# Patient Record
Sex: Female | Born: 1955 | Race: White | Hispanic: No | Marital: Married | Smoking: Former smoker
Health system: Southern US, Community
[De-identification: ages and names within clinical notes are randomized; demographics above are authoritative.]

## PROBLEM LIST (undated history)

## (undated) DIAGNOSIS — I219 Acute myocardial infarction, unspecified: Secondary | ICD-10-CM

## (undated) DIAGNOSIS — K219 Gastro-esophageal reflux disease without esophagitis: Secondary | ICD-10-CM

## (undated) DIAGNOSIS — I10 Essential (primary) hypertension: Secondary | ICD-10-CM

## (undated) DIAGNOSIS — J9602 Acute respiratory failure with hypercapnia: Secondary | ICD-10-CM

## (undated) DIAGNOSIS — G9341 Metabolic encephalopathy: Secondary | ICD-10-CM

## (undated) DIAGNOSIS — M1990 Unspecified osteoarthritis, unspecified site: Secondary | ICD-10-CM

## (undated) DIAGNOSIS — D649 Anemia, unspecified: Secondary | ICD-10-CM

## (undated) DIAGNOSIS — J449 Chronic obstructive pulmonary disease, unspecified: Secondary | ICD-10-CM

## (undated) DIAGNOSIS — E119 Type 2 diabetes mellitus without complications: Secondary | ICD-10-CM

## (undated) DIAGNOSIS — I509 Heart failure, unspecified: Secondary | ICD-10-CM

## (undated) DIAGNOSIS — R569 Unspecified convulsions: Secondary | ICD-10-CM

## (undated) DIAGNOSIS — J9601 Acute respiratory failure with hypoxia: Secondary | ICD-10-CM

## (undated) DIAGNOSIS — F419 Anxiety disorder, unspecified: Secondary | ICD-10-CM

## (undated) DIAGNOSIS — J1282 Pneumonia due to coronavirus disease 2019: Secondary | ICD-10-CM

## (undated) DIAGNOSIS — U071 COVID-19: Secondary | ICD-10-CM

## (undated) DIAGNOSIS — R4182 Altered mental status, unspecified: Secondary | ICD-10-CM

## (undated) DIAGNOSIS — J189 Pneumonia, unspecified organism: Secondary | ICD-10-CM

## (undated) DIAGNOSIS — K7400 Hepatic fibrosis, unspecified: Secondary | ICD-10-CM

## (undated) DIAGNOSIS — Z9689 Presence of other specified functional implants: Secondary | ICD-10-CM

## (undated) DIAGNOSIS — R011 Cardiac murmur, unspecified: Secondary | ICD-10-CM

## (undated) DIAGNOSIS — I739 Peripheral vascular disease, unspecified: Secondary | ICD-10-CM

## (undated) DIAGNOSIS — K760 Fatty (change of) liver, not elsewhere classified: Secondary | ICD-10-CM

## (undated) DIAGNOSIS — G4730 Sleep apnea, unspecified: Secondary | ICD-10-CM

## (undated) DIAGNOSIS — R579 Shock, unspecified: Secondary | ICD-10-CM

## (undated) HISTORY — DX: Unspecified convulsions: R56.9

## (undated) HISTORY — PX: BACK SURGERY: SHX140

---

## 1979-12-25 HISTORY — PX: CHOLECYSTECTOMY: SHX55

## 1983-12-25 HISTORY — PX: TUBAL LIGATION: SHX77

## 1992-12-24 HISTORY — PX: OTHER SURGICAL HISTORY: SHX169

## 2010-12-24 DIAGNOSIS — K760 Fatty (change of) liver, not elsewhere classified: Secondary | ICD-10-CM

## 2010-12-24 HISTORY — DX: Fatty (change of) liver, not elsewhere classified: K76.0

## 2012-10-07 HISTORY — PX: OTHER SURGICAL HISTORY: SHX169

## 2012-12-24 HISTORY — PX: COLONOSCOPY: SHX174

## 2015-03-25 HISTORY — PX: OTHER SURGICAL HISTORY: SHX169

## 2015-06-24 HISTORY — PX: OTHER SURGICAL HISTORY: SHX169

## 2016-01-25 HISTORY — PX: OTHER SURGICAL HISTORY: SHX169

## 2017-03-24 HISTORY — PX: DIAGNOSTIC MAMMOGRAM: HXRAD719

## 2017-07-24 DIAGNOSIS — G2581 Restless legs syndrome: Secondary | ICD-10-CM | POA: Insufficient documentation

## 2017-07-24 DIAGNOSIS — K219 Gastro-esophageal reflux disease without esophagitis: Secondary | ICD-10-CM | POA: Insufficient documentation

## 2017-07-24 DIAGNOSIS — E1169 Type 2 diabetes mellitus with other specified complication: Secondary | ICD-10-CM

## 2017-07-24 DIAGNOSIS — G4733 Obstructive sleep apnea (adult) (pediatric): Secondary | ICD-10-CM | POA: Insufficient documentation

## 2017-07-24 DIAGNOSIS — G4730 Sleep apnea, unspecified: Secondary | ICD-10-CM | POA: Insufficient documentation

## 2017-07-24 DIAGNOSIS — E785 Hyperlipidemia, unspecified: Secondary | ICD-10-CM | POA: Insufficient documentation

## 2017-07-24 HISTORY — DX: Type 2 diabetes mellitus with other specified complication: E11.69

## 2019-05-15 ENCOUNTER — Other Ambulatory Visit: Payer: Self-pay | Admitting: Anesthesiology

## 2019-05-15 DIAGNOSIS — M5416 Radiculopathy, lumbar region: Secondary | ICD-10-CM

## 2019-05-27 ENCOUNTER — Other Ambulatory Visit: Payer: Self-pay

## 2019-05-27 ENCOUNTER — Ambulatory Visit
Admission: RE | Admit: 2019-05-27 | Discharge: 2019-05-27 | Disposition: A | Discharge status: Home / Self Care | Payer: Medicare PPO | Source: Ambulatory Visit | Attending: Anesthesiology | Admitting: Anesthesiology

## 2019-05-27 DIAGNOSIS — M5416 Radiculopathy, lumbar region: Secondary | ICD-10-CM

## 2020-06-13 ENCOUNTER — Other Ambulatory Visit: Payer: Self-pay | Admitting: Neurosurgery

## 2020-06-20 ENCOUNTER — Other Ambulatory Visit: Payer: Self-pay | Admitting: Neurosurgery

## 2020-06-23 NOTE — H&P (Signed)
Chief Complaint   Back pain  HPI   HPI: Brenda Morgan is a 64 y.o. female with longstanding history of lower back and right greater than left leg pain.  Pain is severe and interferes with ADLs.  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  She has failed reasonable conservative treatment to date including p.o. medications, physical therapy and epidural steroid injections.  She presents today for lumbar decompression fusion at L4-5.  She is without any concerns.  There are no problems to display for this patient.   PMH: No past medical history on file.  PSH:  No medications prior to admission.    SH: Social History   Tobacco Use  . Smoking status: Not on file  Substance Use Topics  . Alcohol use: Not on file  . Drug use: Not on file    MEDS: Prior to Admission medications   Medication Sig Start Date End Date Taking? Authorizing Provider  atorvastatin (LIPITOR) 10 MG tablet Take 5 mg by mouth daily.   Yes [provider]  baclofen (LIORESAL) 20 MG tablet Take 20 mg by mouth 3 (three) times daily.   Yes [provider]  carvedilol (COREG) 25 MG tablet Take 25 mg by mouth daily.   Yes [provider]  cetirizine (ZYRTEC) 10 MG tablet Take 10 mg by mouth daily.   Yes [provider]  Cholecalciferol (VITAMIN D) 50 MCG (2000 UT) tablet Take 2,000 Units by mouth daily.   Yes [provider]  Dietary Management Product (OMNIVEX PO) Take 1 tablet by mouth daily.   Yes [provider]  Empagliflozin-metFORMIN HCl ER (SYNJARDY XR) 25-1000 MG TB24 Take 1 tablet by mouth daily.   Yes [provider]  famotidine (PEPCID) 40 MG tablet Take 40 mg by mouth daily.   Yes [provider]  ferrous sulfate 325 (65 FE) MG tablet Take 325 mg by mouth daily with breakfast.   Yes [provider]  fluticasone (FLONASE) 50 MCG/ACT nasal spray Place 1 spray into both  nostrils 2 (two) times daily as needed for allergies or rhinitis.   Yes [provider]  Fluticasone-Salmeterol (ADVAIR) 250-50 MCG/DOSE AEPB Inhale 1 puff into the lungs 2 (two) times daily.   Yes [provider]  icosapent Ethyl (VASCEPA) 1 g capsule Take 1 g by mouth 2 (two) times daily.   Yes [provider]  insulin aspart protamine- aspart (NOVOLOG MIX 70/30) (70-30) 100 UNIT/ML injection Inject 35-55 Units into the skin 2 (two) times daily with a meal.   Yes [provider]  ipratropium-albuterol (DUONEB) 0.5-2.5 (3) MG/3ML SOLN Take 3 mLs by nebulization every 6 (six) hours as needed (asthma).   Yes [provider]  lactulose (CHRONULAC) 10 GM/15ML solution Take 10 g by mouth 3 (three) times daily.   Yes [provider]  lamoTRIgine (LAMICTAL) 150 MG tablet Take 150 mg by mouth daily.   Yes [provider]  magnesium oxide (MAG-OX) 400 MG tablet Take 400 mg by mouth daily.   Yes [provider]  metolazone (ZAROXOLYN) 5 MG tablet Take 5 mg by mouth 3 (three) times a week.   Yes [provider]  oxyCODONE (OXY IR/ROXICODONE) 5 MG immediate release tablet Take 5 mg by mouth every 6 (six) hours.   Yes [provider]  pantoprazole (PROTONIX) 40 MG tablet Take 40 mg by mouth daily.   Yes [provider]  pioglitazone (  ACTOS) 15 MG tablet Take 15 mg by mouth daily.   Yes [provider]  potassium chloride SA (KLOR-CON) 20 MEQ tablet Take 20 mEq by mouth daily.   Yes [provider]  pramipexole (MIRAPEX) 0.5 MG tablet Take 1 mg by mouth at bedtime.   Yes [provider]  pregabalin (LYRICA) 200 MG capsule Take 200 mg by mouth in the morning, at noon, and at bedtime.   Yes [provider]  Semaglutide, 1 MG/DOSE, (OZEMPIC, 1 MG/DOSE,) 2 MG/1.5ML SOPN Inject 1 mg into the skin every Sunday.   Yes [provider]  silver sulfADIAZINE (SILVADENE) 1 % cream  Apply 1 application topically daily.   Yes [provider]  spironolactone (ALDACTONE) 50 MG tablet Take 50 mg by mouth daily.   Yes [provider]  torsemide (DEMADEX) 20 MG tablet Take 20 mg by mouth daily.   Yes [provider]  Vitamin D, Ergocalciferol, (DRISDOL) 1.25 MG (50000 UNIT) CAPS capsule Take 50,000 Units by mouth every 7 (seven) days.   Yes [provider]    ALLERGY: Allergies  Allergen Reactions  . Amlodipine Anaphylaxis, Swelling and Other (See Comments)    Tongue swelling   . Naltrexone Hives  . Penicillins Hives and Other (See Comments)    Tolerated ceftriaxone and cefepime 06/2017 Hives      Social History   Tobacco Use  . Smoking status: Not on file  Substance Use Topics  . Alcohol use: Not on file     No family history on file.   ROS   ROS  Exam   There were no vitals filed for this visit. General appearance: WDWN, NAD Eyes: No scleral injection Cardiovascular: Regular rate and rhythm without murmurs, rubs, gallops. No edema or variciosities. Distal pulses normal. Pulmonary: Effort normal, non-labored breathing Musculoskeletal:     Muscle tone upper extremities: Normal    Muscle tone lower extremities: Normal    Motor exam: Upper Extremities Deltoid Bicep Tricep Grip  Right 5/5 5/5 5/5 5/5  Left 5/5 5/5 5/5 5/5   Lower Extremity IP Quad PF DF EHL  Right 5/5 5/5 5/5 5/5 5/5  Left 5/5 5/5 5/5 5/5 5/5   Neurological Mental Status:    - Patient is awake, alert, oriented to person, place, month, year, and situation    - Patient is able to give a clear and coherent history.    - No signs of aphasia or neglect Cranial Nerves    - II: Visual Fields are full. PERRL    - III/IV/VI: EOMI without ptosis or diploplia.     - V: Facial sensation is grossly normal    - VII: Facial movement is symmetric.     - VIII: hearing is intact to voice    - X: Uvula elevates symmetrically    - XI: Shoulder shrug is  symmetric.    - XII: tongue is midline without atrophy or fasciculations.  Sensory: Sensation grossly intact to LT  Results - Imaging/Labs   No results found for this or any previous visit (from the past 48 hour(s)).  No results found.  IMAGING: RI of the lumbar spine dated 05/18/2020 was personally reviewed.  This demonstrates maintenance of normal lumbar lordosis.  Primary finding is at L4-5 where there is grade 1 anterolisthesis.  There is significant bilateral facet arthropathy with fairly broad disc bulge and right greater than left lateral recess and foraminal stenosis which is severe on the right.  Impression/Plan  64 y.o. female with relatively chronic pain including low back and right greater than left leg pain likely related to spondylolisthesis, stenosis, and spondylosis at L4-5.  She has failed reasonable conservative treatment to date.  We will proceed with surgical decompression and fusion at L4-5.  We have discussed the risks, benefits, and alternatives to surgery at length in the office. All questions today were answered and consent was obtained.  Lisbeth Renshaw, MD [facility]

## 2020-06-23 NOTE — Progress Notes (Signed)
[id number] [place], [place] - [address] AT [facility] [address] [place] [address] Phone: [phone] Fax: [phone]      Your procedure is scheduled on Tuesday, July 6th.  Report to [facility] Main Entrance "A" at 9:15 A.M., and check in at the Admitting office.  Call this number if you have problems the morning of surgery:  [phone]  Call [phone] if you have any questions prior to your surgery date Monday-Friday 8am-4pm    Remember:  Do not eat or drink after midnight the night before your surgery     Take these medicines the morning of surgery with A SIP OF WATER   Atorvastatin (Lipitor)  Carvedilol (Coreg)  Zyrtec - if needed  Baclofen  Famotidine (Pepcid)  Flonase nasal spray  Advair inhaler - bring with you on the day of surgery  Duoneb inhaler   Lamictal  Oxycodone  Pantoprazole (Protonix)  Pregabalin (Lyrica)    As of today, STOP taking any Aspirin (unless otherwise instructed by your surgeon) and Aspirin containing products, Aleve, Naproxen, Ibuprofen, Motrin, Advil, Goody's, BC's, all herbal medications, fish oil, and all vitamins.   WHAT DO I DO ABOUT MY DIABETES MEDICATION?   [name] Do not take oral diabetes medicines (pills) the morning of surgery. - Actos  . THE DAY BEFORE SURGERY, do NOT take Empagliflozin-Metformin Kirk Ruths). . THE NIGHT BEFORE SURGERY, if you take a PM (dinner) dose, only take 70% of dose of Novolog 70/30.     [name] THE MORNING OF SURGERY, do NOT take Empagliflozin-Metformin Kirk Ruths), or Novolog 70/30.   . If your CBG is greater than 220 mg/dL, you may take  of your sliding scale (correction) dose of insulin.   HOW TO MANAGE YOUR DIABETES BEFORE AND AFTER SURGERY  Why is it important to control my blood sugar before and after surgery? . Improving blood sugar levels before and after surgery helps healing and can limit problems. . A way of improving blood sugar control is eating a  healthy diet by: o  Eating less sugar and carbohydrates o  Increasing activity/exercise o  Talking with your doctor about reaching your blood sugar goals . High blood sugars (greater than 180 mg/dL) can raise your risk of infections and slow your recovery, so you will need to focus on controlling your diabetes during the weeks before surgery. . Make sure that the doctor who takes care of your diabetes knows about your planned surgery including the date and location.  How do I manage my blood sugar before surgery? . Check your blood sugar at least 4 times a day, starting 2 days before surgery, to make sure that the level is not too high or low. . Check your blood sugar the morning of your surgery when you wake up and every 2 hours until you get to the Short Stay unit. o If your blood sugar is less than 70 mg/dL, you will need to treat for low blood sugar: - Do not take insulin. - Treat a low blood sugar (less than 70 mg/dL) with  cup of clear juice (cranberry or apple), 4 glucose tablets, OR glucose gel. - Recheck blood sugar in 15 minutes after treatment (to make sure it is greater than 70 mg/dL). If your blood sugar is not greater than 70 mg/dL on recheck, call [phone] for further instructions. . Report your blood sugar to the short stay nurse when you get to Short Stay.  . If you are  admitted to the hospital after surgery: o Your blood sugar will be checked by the staff and you will probably be given insulin after surgery (instead of oral diabetes medicines) to make sure you have good blood sugar levels. o The goal for blood sugar control after surgery is 80-180 mg/dL.                THE DAY OF SURGERY            Do not wear jewelry, make up, or nail polish            Do not wear lotions, powders, perfumes, or deodorant.            Do not shave 48 hours prior to surgery.              Do not bring valuables to the hospital.            [facility] is not responsible for any  belongings or valuables.  Do NOT Smoke (Tobacco/Vapping) or drink Alcohol 24 hours prior to your procedure If you use a CPAP at night, you may bring all equipment for your overnight stay.   Contacts, glasses, dentures or bridgework may not be worn into surgery.      For patients admitted to the hospital, discharge time will be determined by your treatment team.   Patients discharged the day of surgery will not be allowed to drive home, and someone needs to stay with them for 24 hours.    Special instructions:   [place]- Preparing For Surgery  Before surgery, you can play an important role. Because skin is not sterile, your skin needs to be as free of germs as possible. You can reduce the number of germs on your skin by washing with CHG (chlorahexidine gluconate) Soap before surgery.  CHG is an antiseptic cleaner which kills germs and bonds with the skin to continue killing germs even after washing.    Oral Hygiene is also important to reduce your risk of infection.  Remember - BRUSH YOUR TEETH THE MORNING OF SURGERY WITH YOUR REGULAR TOOTHPASTE  Please do not use if you have an allergy to CHG or antibacterial soaps. If your skin becomes reddened/irritated stop using the CHG.  Do not shave (including legs and underarms) for at least 48 hours prior to first CHG shower. It is OK to shave your face.  Please follow these instructions carefully.   1. Shower the NIGHT BEFORE SURGERY and the MORNING OF SURGERY with CHG Soap.   2. If you chose to wash your hair, wash your hair first as usual with your normal shampoo.  3. After you shampoo, rinse your hair and body thoroughly to remove the shampoo.  4. Use CHG as you would any other liquid soap. You can apply CHG directly to the skin and wash gently with a scrungie or a clean washcloth.   5. Apply the CHG Soap to your body ONLY FROM THE NECK DOWN.  Do not use on open wounds or open sores. Avoid contact with your eyes, ears, mouth and  genitals (private parts). Wash Face and genitals (private parts)  with your normal soap.   6. Wash thoroughly, paying special attention to the area where your surgery will be performed.  7. Thoroughly rinse your body with warm water from the neck down.  8. DO NOT shower/wash with your normal soap after using and rinsing off the CHG Soap.  9. Pat yourself dry with a  CLEAN TOWEL.  10. Wear CLEAN PAJAMAS to bed the night before surgery, wear comfortable clothes the morning of surgery  11. Place CLEAN SHEETS on your bed the night of your first shower and DO NOT SLEEP WITH PETS.   Day of Surgery:   Do not apply any deodorants/lotions.  Please wear clean clothes to the hospital/surgery center.   Remember to brush your teeth WITH YOUR REGULAR TOOTHPASTE.   Please read over the following fact sheets that you were given.

## 2020-06-24 ENCOUNTER — Encounter (HOSPITAL_COMMUNITY)
Admission: RE | Admit: 2020-06-24 | Discharge: 2020-06-24 | Disposition: A | Discharge status: Home / Self Care | Payer: BC Managed Care – PPO | Source: Ambulatory Visit | Attending: Neurosurgery | Admitting: Neurosurgery

## 2020-06-24 ENCOUNTER — Other Ambulatory Visit: Payer: Self-pay

## 2020-06-24 ENCOUNTER — Encounter (HOSPITAL_COMMUNITY): Payer: Self-pay

## 2020-06-24 ENCOUNTER — Other Ambulatory Visit (HOSPITAL_COMMUNITY)
Admission: RE | Admit: 2020-06-24 | Discharge: 2020-06-24 | Disposition: A | Discharge status: Home / Self Care | Payer: BC Managed Care – PPO | Source: Ambulatory Visit | Attending: Neurosurgery | Admitting: Neurosurgery

## 2020-06-24 DIAGNOSIS — Z01818 Encounter for other preprocedural examination: Secondary | ICD-10-CM | POA: Diagnosis present

## 2020-06-24 DIAGNOSIS — Z01812 Encounter for preprocedural laboratory examination: Secondary | ICD-10-CM | POA: Diagnosis not present

## 2020-06-24 DIAGNOSIS — Z20822 Contact with and (suspected) exposure to covid-19: Secondary | ICD-10-CM | POA: Insufficient documentation

## 2020-06-24 HISTORY — DX: Type 2 diabetes mellitus without complications: E11.9

## 2020-06-24 HISTORY — DX: Sleep apnea, unspecified: G47.30

## 2020-06-24 HISTORY — DX: Chronic obstructive pulmonary disease, unspecified: J44.9

## 2020-06-24 LAB — CBC
HCT: 43.6 % (ref 36.0–46.0)
Hemoglobin: 13.6 g/dL (ref 12.0–15.0)
MCH: 29.4 pg (ref 26.0–34.0)
MCHC: 31.2 g/dL (ref 30.0–36.0)
MCV: 94.2 fL (ref 80.0–100.0)
Platelets: 252 10*3/uL (ref 150–400)
RBC: 4.63 MIL/uL (ref 3.87–5.11)
RDW: 15 % (ref 11.5–15.5)
WBC: 11.7 10*3/uL — ABNORMAL HIGH (ref 4.0–10.5)
nRBC: 0 % (ref 0.0–0.2)

## 2020-06-24 LAB — BASIC METABOLIC PANEL
Anion gap: 14 (ref 5–15)
BUN: 31 mg/dL — ABNORMAL HIGH (ref 8–23)
CO2: 33 mmol/L — ABNORMAL HIGH (ref 22–32)
Calcium: 10 mg/dL (ref 8.9–10.3)
Chloride: 92 mmol/L — ABNORMAL LOW (ref 98–111)
Creatinine, Ser: 1.5 mg/dL — ABNORMAL HIGH (ref 0.44–1.00)
GFR calc Af Amer: 43 mL/min — ABNORMAL LOW (ref >60)
GFR calc non Af Amer: 37 mL/min — ABNORMAL LOW (ref >60)
Glucose, Bld: 50 mg/dL — ABNORMAL LOW (ref 70–99)
Potassium: 3.5 mmol/L (ref 3.5–5.1)
Sodium: 139 mmol/L (ref 135–145)

## 2020-06-24 LAB — GLUCOSE, CAPILLARY: Glucose-Capillary: 122 mg/dL — ABNORMAL HIGH (ref 70–99)

## 2020-06-24 LAB — TYPE AND SCREEN
ABO/RH(D): A POS
Antibody Screen: NEGATIVE

## 2020-06-24 LAB — HEMOGLOBIN A1C
Hgb A1c MFr Bld: 6.7 % — ABNORMAL HIGH (ref 4.8–5.6)
Mean Plasma Glucose: 145.59 mg/dL

## 2020-06-24 LAB — SURGICAL PCR SCREEN
MRSA, PCR: POSITIVE — AB
Staphylococcus aureus: POSITIVE — AB

## 2020-06-24 LAB — SARS CORONAVIRUS 2 (TAT 6-24 HRS): SARS Coronavirus 2: NEGATIVE

## 2020-06-24 NOTE — Progress Notes (Signed)
This note also relates to the following rows which could not be included: CBG - Cannot attach notes to extension rows    06/24/20 0950  Vitals  Temp (!) 97.5 F (36.4 C)  Temp Source Oral  Pulse Rate 73  Pulse Rate Source Dinamap  Resp 18  Respiratory Pattern Regular  BP 119/73  SpO2 98 %  Oxygen Therapy  O2 Device Room Air  Height and Weight  Height 5\' 6"  (1.676 m)  Height Method Stated  Weight 90.7 kg  Weight Method Stated  BMI (Calculated) 32.3  BSA (Calculated - sq m) 2.06 sq meters  Pain Assessment  Pain Scale 0-10  Pain Score 8  Pain Type Acute pain  Pain Location Hip  Pain Orientation Left;Right  Pain Descriptors / Indicators Radiating  Pain Onset On-going  Patients Stated Pain Goal 3  Pain Intervention(s) RN made aware  Multiple Pain Sites Yes  LOC  Level of Consciousness Alert  Orientation Level Oriented X4    CBG 122    Pt present to for her PAT appt. Per pt she came in and was at the front desk waiting for someone to sign her in when she readjusted her position and her standing walker tipped over resulting in her also falling. Staff all heard a loud crash and immediately started to look around to find source of noise, pt was found on the floor by the entrance door to PAT front desk. Pt was conscious A+Ox4, pt stated she hit the back of her head. Vital signs immediately obtained (see above), CBG checked (see above). Per pt she had leaned against her walker and the walker tipped over, which resulted in her falling as well.  , Fayrene Fearing called immediately as soon as pt was found on the floor, wheelchair brought to pt and when vitals and CBG checked, pt was transferred to wheelchair. [place], PA assessed pt, pt does not seem to be in any immediate danger or distress, just shocked from the fall.  Skin assessment: pt had left skin tear and bruising on L anterior forearm, per pt, she has had some recent falls at home and the skin tear and bruising was the result of that.   Pt had skin abrasion on R posterior elbow that was caused from today's fall. Both sites were cleaned with betadine and covered with mepilex.   Per pt, she wanted to continue with her PAT appt, per Fayrene Fearing, Fayrene Fearing, pt did not need to go to ED for any immediate interventions, pt agrees to this.  During PAT, pt did start complaining of increased pressure behind the L eye. Full neuro exam performed, pt strengths were equal bilaterally in upper and lower extremities, pupils round/reactive/equal. Pt complains headache 6/10 pain. At this time, pt did not see too fatigued, pt A+Ox4. BP rechecked 113/61 (see flowsheets). Pt husband brought to room. [place], Fayrene Fearing made aware.   Throughout PAT appt, pt noted to have increased fatigue. She would fall asleep in the middle of speaking, dose off while questions were being asked. Per husband in the room, this was normal for her because she doesn't sleep well at night and hasn't been for the past couple months. Per pt, she only got 1 hour of sleep the night prior. Towards end of PAT appt, husband stepped out.   During pre-op instructions, pt could not stay awake and continued to fall asleep more consistently. [place], PA made aware of pt condition. Neuro exam performed again, pt is oriented and easily arousable, however,  continued to fall sleep really easily. When PAT appt was finished, Fayrene Fearing, [place] came to assess pt again. No further interventions at this time. Pt was to be monitored closely by husband who has background as EMT. Per husband, he will take her to local urgent care if her condition changes. Pt brought out via wheelchair. Pt walker and personal belongings with patient.   All events updated to [facility], RN and Lindsi, RN as well.

## 2020-06-24 NOTE — Progress Notes (Signed)
Your procedure is scheduled on Tuesday, July 6.  Report to [facility] Main Entrance "A" at 09:10 A.M., and check in at the Admitting office.  Call this number if you have problems the morning of surgery: [phone]  Call [phone] if you have any questions prior to your surgery date Monday-Friday 8am-4pm   Remember: Do not eat or drink after midnight the night before your surgery    Take these medicines the morning of surgery with A SIP OF WATER: atorvastatin (LIPITOR) baclofen (LIORESAL) carvedilol (COREG) cetirizine (ZYRTEC)  famotidine (PEPCID) lamoTRIgine (LAMICTAL) oxyCODONE (OXY IR/ROXICODONE) pantoprazole (PROTONIX)  pregabalin (LYRICA)   IF NEEDED: fluticasone (FLONASE) ipratropium-albuterol (DUONEB) **Please bring all inhalers with you the day of surgery.    As of today, STOP taking any Aspirin (unless otherwise instructed by your surgeon), Aspirin containing products, Aleve, Naproxen, Ibuprofen, Motrin, Advil, Goody's, BC's, all herbal medications, fish oil, and all vitamins.   WHAT DO I DO ABOUT MY DIABETES MEDICATION?  . THE DAY/NIGHT BEFORE SURGERY  o Empagliflozin-metFORMIN HCl ER (SYNJARDY XR) - NONE   o insulin aspart protamine- aspart (NOVOLOG MIX 70/30) - AM dose - take as usual - PM dose - take 70% of dose (usual dose x 0.7)  o pioglitazone (ACTOS) - take as usual    . THE MORNING OF SURGERY o Empagliflozin-metFORMIN HCl ER (SYNJARDY XR) - NONE  o insulin aspart protamine- aspart (NOVOLOG MIX 70/30) - NONE o pioglitazone (ACTOS) - NONE   DO NOT take any ORAL diabetic medications on the morning of surgery  . If your CBG is greater than 220 mg/dL, you may take  of your sliding scale (correction) dose of insulin.   HOW TO MANAGE YOUR DIABETES BEFORE AND AFTER SURGERY  Why is it important to control my blood sugar before and after surgery? . Improving blood sugar levels before and after surgery helps healing and can limit  problems. . A way of improving blood sugar control is eating a healthy diet by: o  Eating less sugar and carbohydrates o  Increasing activity/exercise o  Talking with your doctor about reaching your blood sugar goals . High blood sugars (greater than 180 mg/dL) can raise your risk of infections and slow your recovery, so you will need to focus on controlling your diabetes during the weeks before surgery. . Make sure that the doctor who takes care of your diabetes knows about your planned surgery including the date and location.  How do I manage my blood sugar before surgery? . Check your blood sugar at least 4 times a day, starting 2 days before surgery, to make sure that the level is not too high or low. . Check your blood sugar the morning of your surgery when you wake up and every 2 hours until you get to the Short Stay unit. o If your blood sugar is less than 70 mg/dL, you will need to treat for low blood sugar: - Do not take insulin. - Treat a low blood sugar (less than 70 mg/dL) with  cup of clear juice (cranberry or apple), 4 glucose tablets, OR glucose gel. - Recheck blood sugar in 15 minutes after treatment (to make sure it is greater than 70 mg/dL). If your blood sugar is not greater than 70 mg/dL on recheck, call [phone] for further instructions. . Report your blood sugar to the short stay nurse when you get to Short Stay.  . If you are admitted to the hospital after surgery: o Your blood sugar  will be checked by the staff and you will probably be given insulin after surgery (instead of oral diabetes medicines) to make sure you have good blood sugar levels. o The goal for blood sugar control after surgery is 80-180 mg/dL.     The Morning of Surgery  Do not wear jewelry, make-up or nail polish.  Do not wear lotions, powders, or perfumes, or deodorant  Do not shave 48 hours prior to surgery.    Do not bring valuables to the hospital.  [facility] is not responsible for any  belongings or valuables.  If you are a smoker, DO NOT Smoke 24 hours prior to surgery  If you wear a CPAP at night please bring your mask the morning of surgery   Remember that you must have someone to transport you home after your surgery, and remain with you for 24 hours if you are discharged the same day.   Please bring cases for contacts, glasses, hearing aids, dentures or bridgework because it cannot be worn into surgery.    Leave your suitcase in the car.  After surgery it may be brought to your room.  For patients admitted to the hospital, discharge time will be determined by your treatment team.  Patients discharged the day of surgery will not be allowed to drive home.    Special instructions:   [place]- Preparing For Surgery  Before surgery, you can play an important role. Because skin is not sterile, your skin needs to be as free of germs as possible. You can reduce the number of germs on your skin by washing with CHG (chlorahexidine gluconate) Soap before surgery.  CHG is an antiseptic cleaner which kills germs and bonds with the skin to continue killing germs even after washing.    Oral Hygiene is also important to reduce your risk of infection.  Remember - BRUSH YOUR TEETH THE MORNING OF SURGERY WITH YOUR REGULAR TOOTHPASTE  Please do not use if you have an allergy to CHG or antibacterial soaps. If your skin becomes reddened/irritated stop using the CHG.  Do not shave (including legs and underarms) for at least 48 hours prior to first CHG shower. It is OK to shave your face.  Please follow these instructions carefully.   1. Shower the NIGHT BEFORE SURGERY and the MORNING OF SURGERY with CHG Soap.   2. If you chose to wash your hair and body, wash as usual with your normal shampoo and body-wash/soap.  3. Rinse your hair and body thoroughly to remove the shampoo and soap.  4. Apply CHG directly to the skin (ONLY FROM THE NECK DOWN) and wash gently with a scrungie  or a clean washcloth.   5. Do not use on open wounds or open sores. Avoid contact with your eyes, ears, mouth and genitals (private parts). Wash Face and genitals (private parts)  with your normal soap.   6. Wash thoroughly, paying special attention to the area where your surgery will be performed.  7. Thoroughly rinse your body with warm water from the neck down.  8. DO NOT shower/wash with your normal soap after using and rinsing off the CHG Soap.  9. Pat yourself dry with a CLEAN TOWEL.  10. Wear CLEAN PAJAMAS to bed the night before surgery  11. Place CLEAN SHEETS on your bed the night of your first shower and DO NOT SLEEP WITH PETS.  12. Wear comfortable clothes the morning of surgery.     Day of Surgery:  Please shower the morning of surgery with the CHG soap Do not apply any deodorants/lotions. Please wear clean clothes to the hospital/surgery center.   Remember to brush your teeth WITH YOUR REGULAR TOOTHPASTE.   Please read over the following fact sheets that you were given.

## 2020-06-24 NOTE — Progress Notes (Signed)
Pt lab work glucose was 50. Fayrene Fearing, [place] called and made aware. Pt called to make sure she was okay, per pt she just ate and feels fine. Pt was on her way to covid testing before heading home.

## 2020-06-24 NOTE — Progress Notes (Signed)
Anesthesia Chart Review:  I was called to evaluate patient after unwitnessed fall in the preadmission testing lobby.  Reportedly, the patient was waiting to check in, standing at front desk with assistance of upright walker which she uses for ambulation, and lost her balance and fell to her right side.  This was unwitnessed, but immediately responded to by preadmission testing RNs and I was called to evaluate.  On my arrival patient was sitting on the floor, in no acute distress, no obvious injuries or deformity.  She reports she is not exactly sure why she fell, feels like she just lost her balance.  She says she has had 2 similar falls recently at home.  She says she thinks she may have hit the back of her head when she fell, however she denies any pain in her head, and there are no obvious signs of injury.  Her left arm does have an abrasion with scant bleeding, but she says this was pre-existing from a fall at home.  She does have some neuropathic pain in lower extremities reportedly due to lumbar spine pathology.  She is not on any anticoagulants.  Her vital signs were checked and were within normal limits.  Her blood sugar was 122.  Her mentation was appropriate.  She was alert and oriented x4.  Respiratory and cardiovascular exam were normal.  She was able to stand with assistance and was helped to a wheelchair.  She was offered evaluation in emergency department but stated she did not feel like this was necessary.  Her preoperative appointment was carried out like usual. She was noted by PAT RN to fall asleep easily during the interview, but was easily arousable and oriented x4. She was able to carry conversation easily, but would fall asleep without stimulation. Her husband states this is normal. Pt states she has had excessive daytime somnolence for several years, she has OSA but says she does not use her BiPAP and only sleeps a few hours per night. I did reevaluate the patient again at the end of her  appointment. Her only complaint was mild occipital soreness, she denied pain elsewhere, said she otherwise felt at baseline. She was able to converse easily and responded appropriately to all questions. Repeat physical exam showed no signs of injury or gross deficit. She was again offered ED eval but she declined. Pt's husband works as an Museum/gallery exhibitions officer and was given strict precautions for bringing pt for ED eval.   Zannie Cove [facility] Short Stay Center/Anesthesiology Phone [phone] 06/24/2020 2:52 PM

## 2020-06-24 NOTE — Progress Notes (Signed)
PCP - Dr. Ronnette Hila Cardiologist - Dr. Rockne Menghini Pulmonologist - Dr. Orson Aloe Sleep apnea - Dr. Egbert Garibaldi Orthopedic - Dr. Egbert Garibaldi Gastroenterologist - Dr. Alric Ran Urologist - Dr. Gershon Mussel  (pt is from [place])   Chest x-ray - n/a EKG - 06/24/20 Stress Test - pt denies  ECHO - pt denies Cardiac Cath - pt denies  Sleep Study - yes BiPAP -  yes  Fasting Blood Sugar - 100s Checks Blood Sugar 2x/day  Blood Thinner Instructions:n/a Aspirin Instructions:n/a  ERAS Protcol - n/a PRE-SURGERY Ensure or G2- n/a  COVID TEST- 06/24/20  Coronavirus Screening  Have you experienced the following symptoms:  Cough yes/no: No Fever (>100.53F)  yes/no: No Runny nose yes/no: No Sore throat yes/no: No Difficulty breathing/shortness of breath  yes/no: No  Have you or a family member traveled in the last 14 days and where? yes/no: No   If the patient indicates "YES" to the above questions, their PAT will be rescheduled to limit the exposure to others and, the surgeon will be notified. THE PATIENT WILL NEED TO BE ASYMPTOMATIC FOR 14 DAYS.   If the patient is not experiencing any of these symptoms, the PAT nurse will instruct them to NOT bring anyone with them to their appointment since they may have these symptoms or traveled as well.   Please remind your patients and families that hospital visitation restrictions are in effect and the importance of the restrictions.     Anesthesia review: yes - pt had unwitnessed fall coming in to PAT appt at front desk/ hit her head. See notes.   Patient denies shortness of breath, fever, cough and chest pain at PAT appointment   All instructions explained to the patient, with a verbal understanding of the material. Patient agrees to go over the instructions while at home for a better understanding. Patient also instructed to self quarantine after being tested for COVID-19. The opportunity to ask questions was provided.

## 2020-06-28 ENCOUNTER — Inpatient Hospital Stay (HOSPITAL_COMMUNITY): Payer: BC Managed Care – PPO | Admitting: Vascular Surgery

## 2020-06-28 ENCOUNTER — Encounter (HOSPITAL_COMMUNITY)
Admission: RE | Disposition: A | Discharge status: Home Health | Payer: Self-pay | Source: Home / Self Care | Attending: Neurosurgery

## 2020-06-28 ENCOUNTER — Inpatient Hospital Stay (HOSPITAL_COMMUNITY)
Admission: RE | Admit: 2020-06-28 | Discharge: 2020-07-01 | DRG: 455 | Disposition: A | Discharge status: Home Health | Payer: BC Managed Care – PPO | Attending: Neurosurgery | Admitting: Neurosurgery

## 2020-06-28 ENCOUNTER — Inpatient Hospital Stay (HOSPITAL_COMMUNITY): Payer: BC Managed Care – PPO | Admitting: Certified Registered Nurse Anesthetist

## 2020-06-28 ENCOUNTER — Inpatient Hospital Stay (HOSPITAL_COMMUNITY): Payer: BC Managed Care – PPO

## 2020-06-28 ENCOUNTER — Other Ambulatory Visit: Payer: Self-pay

## 2020-06-28 ENCOUNTER — Encounter (HOSPITAL_COMMUNITY): Payer: Self-pay | Admitting: Neurosurgery

## 2020-06-28 DIAGNOSIS — K219 Gastro-esophageal reflux disease without esophagitis: Secondary | ICD-10-CM | POA: Diagnosis present

## 2020-06-28 DIAGNOSIS — M4316 Spondylolisthesis, lumbar region: Secondary | ICD-10-CM | POA: Diagnosis present

## 2020-06-28 DIAGNOSIS — M47816 Spondylosis without myelopathy or radiculopathy, lumbar region: Secondary | ICD-10-CM | POA: Diagnosis present

## 2020-06-28 DIAGNOSIS — K08109 Complete loss of teeth, unspecified cause, unspecified class: Secondary | ICD-10-CM | POA: Diagnosis present

## 2020-06-28 DIAGNOSIS — Z79899 Other long term (current) drug therapy: Secondary | ICD-10-CM

## 2020-06-28 DIAGNOSIS — Z87892 Personal history of anaphylaxis: Secondary | ICD-10-CM

## 2020-06-28 DIAGNOSIS — Z880 Allergy status to penicillin: Secondary | ICD-10-CM

## 2020-06-28 DIAGNOSIS — Z888 Allergy status to other drugs, medicaments and biological substances status: Secondary | ICD-10-CM

## 2020-06-28 DIAGNOSIS — M48061 Spinal stenosis, lumbar region without neurogenic claudication: Principal | ICD-10-CM | POA: Diagnosis present

## 2020-06-28 DIAGNOSIS — Z794 Long term (current) use of insulin: Secondary | ICD-10-CM | POA: Diagnosis not present

## 2020-06-28 DIAGNOSIS — Z419 Encounter for procedure for purposes other than remedying health state, unspecified: Secondary | ICD-10-CM

## 2020-06-28 DIAGNOSIS — Z7951 Long term (current) use of inhaled steroids: Secondary | ICD-10-CM

## 2020-06-28 LAB — GLUCOSE, CAPILLARY
Glucose-Capillary: 74 mg/dL (ref 70–99)
Glucose-Capillary: 116 mg/dL — ABNORMAL HIGH (ref 70–99)
Glucose-Capillary: 138 mg/dL — ABNORMAL HIGH (ref 70–99)
Glucose-Capillary: 180 mg/dL — ABNORMAL HIGH (ref 70–99)

## 2020-06-28 LAB — ABO/RH: ABO/RH(D): A POS

## 2020-06-28 SURGERY — POSTERIOR LUMBAR FUSION 1 LEVEL
Anesthesia: General

## 2020-06-28 MED ORDER — VANCOMYCIN HCL IN DEXTROSE 1-5 GM/200ML-% IV SOLN
1000.0000 mg | Freq: Once | INTRAVENOUS | Status: AC
  Administered 2020-06-28: 1000 mg via INTRAVENOUS
  Filled 2020-06-28: qty 200

## 2020-06-28 MED ORDER — PHENYLEPHRINE 40 MCG/ML (10ML) SYRINGE FOR IV PUSH (FOR BLOOD PRESSURE SUPPORT)
PREFILLED_SYRINGE | INTRAVENOUS | Status: AC
  Filled 2020-06-28: qty 20

## 2020-06-28 MED ORDER — DOCUSATE SODIUM 100 MG PO CAPS
100.0000 mg | ORAL_CAPSULE | Freq: Two times a day (BID) | ORAL | Status: DC
  Administered 2020-06-28 – 2020-07-01 (×6): 100 mg via ORAL
  Filled 2020-06-28 (×6): qty 1

## 2020-06-28 MED ORDER — ICOSAPENT ETHYL 1 G PO CAPS
1.0000 g | ORAL_CAPSULE | Freq: Two times a day (BID) | ORAL | Status: DC
  Administered 2020-06-28 – 2020-07-01 (×5): 1 g via ORAL
  Filled 2020-06-28 (×6): qty 1

## 2020-06-28 MED ORDER — HYDROCODONE-ACETAMINOPHEN 5-325 MG PO TABS
1.0000 | ORAL_TABLET | ORAL | Status: DC | PRN
  Administered 2020-06-29 (×2): 1 via ORAL
  Filled 2020-06-28 (×2): qty 1

## 2020-06-28 MED ORDER — LIDOCAINE-EPINEPHRINE 1 %-1:100000 IJ SOLN
INTRAMUSCULAR | Status: AC
  Filled 2020-06-28: qty 1

## 2020-06-28 MED ORDER — IPRATROPIUM-ALBUTEROL 0.5-2.5 (3) MG/3ML IN SOLN: 3.0000 mL | Freq: Four times a day (QID) | RESPIRATORY_TRACT | Status: DC | PRN

## 2020-06-28 MED ORDER — ARTIFICIAL TEARS OPHTHALMIC OINT
TOPICAL_OINTMENT | OPHTHALMIC | Status: AC
  Filled 2020-06-28: qty 3.5

## 2020-06-28 MED ORDER — CEFAZOLIN SODIUM-DEXTROSE 2-4 GM/100ML-% IV SOLN
2.0000 g | INTRAVENOUS | Status: AC
  Administered 2020-06-28: 2 g via INTRAVENOUS
  Filled 2020-06-28: qty 100

## 2020-06-28 MED ORDER — BACLOFEN 10 MG PO TABS
20.0000 mg | ORAL_TABLET | Freq: Three times a day (TID) | ORAL | Status: DC
  Administered 2020-06-28 – 2020-07-01 (×8): 20 mg via ORAL
  Filled 2020-06-28 (×8): qty 2

## 2020-06-28 MED ORDER — PRAMIPEXOLE DIHYDROCHLORIDE 1 MG PO TABS
1.0000 mg | ORAL_TABLET | Freq: Every day | ORAL | Status: DC
  Administered 2020-06-29 – 2020-06-30 (×2): 1 mg via ORAL
  Filled 2020-06-28 (×4): qty 1

## 2020-06-28 MED ORDER — ONDANSETRON HCL 4 MG PO TABS: 4.0000 mg | ORAL_TABLET | Freq: Four times a day (QID) | ORAL | Status: DC | PRN

## 2020-06-28 MED ORDER — PROPOFOL 10 MG/ML IV BOLUS
INTRAVENOUS | Status: AC
  Filled 2020-06-28: qty 20

## 2020-06-28 MED ORDER — SODIUM CHLORIDE 0.9 % IV SOLN: INTRAVENOUS | Status: DC

## 2020-06-28 MED ORDER — FAMOTIDINE 20 MG PO TABS
40.0000 mg | ORAL_TABLET | Freq: Every day | ORAL | Status: DC
  Administered 2020-06-29 – 2020-07-01 (×3): 40 mg via ORAL
  Filled 2020-06-28 (×3): qty 2

## 2020-06-28 MED ORDER — SENNOSIDES-DOCUSATE SODIUM 8.6-50 MG PO TABS: 1.0000 | ORAL_TABLET | Freq: Every evening | ORAL | Status: DC | PRN

## 2020-06-28 MED ORDER — LACTULOSE 10 GM/15ML PO SOLN
10.0000 g | Freq: Three times a day (TID) | ORAL | Status: DC
  Administered 2020-06-28 – 2020-07-01 (×8): 10 g via ORAL
  Filled 2020-06-28 (×8): qty 15

## 2020-06-28 MED ORDER — DEXTROSE 50 % IV SOLN: 25.0000 mL | INTRAVENOUS | Status: AC

## 2020-06-28 MED ORDER — PHENYLEPHRINE HCL (PRESSORS) 10 MG/ML IV SOLN
INTRAVENOUS | Status: AC
  Filled 2020-06-28: qty 1

## 2020-06-28 MED ORDER — FENTANYL CITRATE (PF) 100 MCG/2ML IJ SOLN
25.0000 ug | INTRAMUSCULAR | Status: DC | PRN
  Administered 2020-06-28 (×2): 50 ug via INTRAVENOUS

## 2020-06-28 MED ORDER — POTASSIUM CHLORIDE CRYS ER 20 MEQ PO TBCR
20.0000 meq | EXTENDED_RELEASE_TABLET | Freq: Every day | ORAL | Status: DC
  Administered 2020-06-29 – 2020-07-01 (×3): 20 meq via ORAL
  Filled 2020-06-28 (×3): qty 1

## 2020-06-28 MED ORDER — ONDANSETRON HCL 4 MG/2ML IJ SOLN
4.0000 mg | Freq: Four times a day (QID) | INTRAMUSCULAR | Status: DC | PRN
  Administered 2020-06-28: 4 mg via INTRAVENOUS
  Filled 2020-06-28: qty 2

## 2020-06-28 MED ORDER — METHOCARBAMOL 1000 MG/10ML IJ SOLN
500.0000 mg | Freq: Four times a day (QID) | INTRAVENOUS | Status: DC | PRN
  Filled 2020-06-28: qty 5

## 2020-06-28 MED ORDER — DEXAMETHASONE SODIUM PHOSPHATE 10 MG/ML IJ SOLN
INTRAMUSCULAR | Status: DC | PRN
  Administered 2020-06-28: 10 mg via INTRAVENOUS

## 2020-06-28 MED ORDER — FLEET ENEMA 7-19 GM/118ML RE ENEM: 1.0000 | ENEMA | Freq: Once | RECTAL | Status: DC | PRN

## 2020-06-28 MED ORDER — METHOCARBAMOL 500 MG PO TABS
500.0000 mg | ORAL_TABLET | Freq: Four times a day (QID) | ORAL | Status: DC | PRN
  Administered 2020-06-28 – 2020-07-01 (×4): 500 mg via ORAL
  Filled 2020-06-28 (×4): qty 1

## 2020-06-28 MED ORDER — LIDOCAINE 2% (20 MG/ML) 5 ML SYRINGE
INTRAMUSCULAR | Status: AC
  Filled 2020-06-28: qty 5

## 2020-06-28 MED ORDER — CHLORHEXIDINE GLUCONATE 0.12 % MT SOLN
15.0000 mL | Freq: Once | OROMUCOSAL | Status: AC
  Administered 2020-06-28: 15 mL via OROMUCOSAL
  Filled 2020-06-28: qty 15

## 2020-06-28 MED ORDER — MAGNESIUM OXIDE 400 (241.3 MG) MG PO TABS
400.0000 mg | ORAL_TABLET | Freq: Every day | ORAL | Status: DC
  Administered 2020-06-29 – 2020-07-01 (×4): 400 mg via ORAL
  Filled 2020-06-28 (×6): qty 1

## 2020-06-28 MED ORDER — DEXAMETHASONE SODIUM PHOSPHATE 10 MG/ML IJ SOLN
INTRAMUSCULAR | Status: AC
  Filled 2020-06-28: qty 1

## 2020-06-28 MED ORDER — BUPIVACAINE HCL (PF) 0.5 % IJ SOLN
INTRAMUSCULAR | Status: AC
  Filled 2020-06-28: qty 30

## 2020-06-28 MED ORDER — ARTIFICIAL TEARS OPHTHALMIC OINT
TOPICAL_OINTMENT | OPHTHALMIC | Status: DC | PRN
  Administered 2020-06-28: 1 via OPHTHALMIC

## 2020-06-28 MED ORDER — MIDAZOLAM HCL 2 MG/2ML IJ SOLN
1.0000 mg | INTRAMUSCULAR | Status: DC | PRN
  Administered 2020-06-28: 1 mg via INTRAVENOUS

## 2020-06-28 MED ORDER — ONDANSETRON HCL 4 MG/2ML IJ SOLN
INTRAMUSCULAR | Status: AC
  Filled 2020-06-28: qty 2

## 2020-06-28 MED ORDER — BISACODYL 10 MG RE SUPP: 10.0000 mg | Freq: Every day | RECTAL | Status: DC | PRN

## 2020-06-28 MED ORDER — THROMBIN 5000 UNITS EX SOLR: OROMUCOSAL | Status: DC | PRN

## 2020-06-28 MED ORDER — FENTANYL CITRATE (PF) 250 MCG/5ML IJ SOLN
INTRAMUSCULAR | Status: AC
  Filled 2020-06-28: qty 5

## 2020-06-28 MED ORDER — FENTANYL CITRATE (PF) 100 MCG/2ML IJ SOLN
INTRAMUSCULAR | Status: AC
  Filled 2020-06-28: qty 2

## 2020-06-28 MED ORDER — LACTATED RINGERS IV SOLN: INTRAVENOUS | Status: DC

## 2020-06-28 MED ORDER — OXYCODONE HCL 5 MG PO TABS
5.0000 mg | ORAL_TABLET | ORAL | Status: DC | PRN
  Administered 2020-06-28 – 2020-07-01 (×12): 10 mg via ORAL
  Filled 2020-06-28 (×12): qty 2

## 2020-06-28 MED ORDER — SODIUM CHLORIDE 0.9 % IV SOLN: INTRAVENOUS | Status: DC | PRN

## 2020-06-28 MED ORDER — EPHEDRINE 5 MG/ML INJ
INTRAVENOUS | Status: AC
  Filled 2020-06-28: qty 10

## 2020-06-28 MED ORDER — PHENOL 1.4 % MT LIQD: 1.0000 | OROMUCOSAL | Status: DC | PRN

## 2020-06-28 MED ORDER — SODIUM CHLORIDE 0.9% FLUSH: 3.0000 mL | INTRAVENOUS | Status: DC | PRN

## 2020-06-28 MED ORDER — ACETAMINOPHEN 650 MG RE SUPP: 650.0000 mg | RECTAL | Status: DC | PRN

## 2020-06-28 MED ORDER — ATORVASTATIN CALCIUM 10 MG PO TABS
5.0000 mg | ORAL_TABLET | Freq: Every day | ORAL | Status: DC
  Administered 2020-06-29 – 2020-07-01 (×3): 5 mg via ORAL
  Filled 2020-06-28 (×3): qty 1

## 2020-06-28 MED ORDER — DEXTROSE 50 % IV SOLN
INTRAVENOUS | Status: AC
  Administered 2020-06-28: 25 mL via INTRAVENOUS
  Filled 2020-06-28: qty 50

## 2020-06-28 MED ORDER — PHENYLEPHRINE HCL-NACL 10-0.9 MG/250ML-% IV SOLN
INTRAVENOUS | Status: DC | PRN
  Administered 2020-06-28: 25 ug/min via INTRAVENOUS

## 2020-06-28 MED ORDER — LAMOTRIGINE 100 MG PO TABS
150.0000 mg | ORAL_TABLET | Freq: Every day | ORAL | Status: DC
  Administered 2020-06-29 – 2020-07-01 (×3): 150 mg via ORAL
  Filled 2020-06-28 (×4): qty 2

## 2020-06-28 MED ORDER — HYDROMORPHONE HCL 1 MG/ML IJ SOLN
0.5000 mg | INTRAMUSCULAR | Status: DC | PRN
  Administered 2020-06-28 – 2020-06-30 (×3): 1 mg via INTRAVENOUS
  Filled 2020-06-28 (×3): qty 1

## 2020-06-28 MED ORDER — LIDOCAINE-EPINEPHRINE 1 %-1:100000 IJ SOLN
INTRAMUSCULAR | Status: DC | PRN
  Administered 2020-06-28: 5 mL

## 2020-06-28 MED ORDER — ROCURONIUM BROMIDE 10 MG/ML (PF) SYRINGE
PREFILLED_SYRINGE | INTRAVENOUS | Status: AC
  Filled 2020-06-28: qty 10

## 2020-06-28 MED ORDER — CHLORHEXIDINE GLUCONATE CLOTH 2 % EX PADS: 6.0000 | MEDICATED_PAD | Freq: Once | CUTANEOUS | Status: DC

## 2020-06-28 MED ORDER — MIDAZOLAM HCL 2 MG/2ML IJ SOLN: 1.0000 mg | INTRAMUSCULAR | Status: DC

## 2020-06-28 MED ORDER — FENTANYL CITRATE (PF) 250 MCG/5ML IJ SOLN
INTRAMUSCULAR | Status: DC | PRN
  Administered 2020-06-28: 50 ug via INTRAVENOUS
  Administered 2020-06-28: 100 ug via INTRAVENOUS

## 2020-06-28 MED ORDER — ROCURONIUM BROMIDE 10 MG/ML (PF) SYRINGE
PREFILLED_SYRINGE | INTRAVENOUS | Status: DC | PRN
  Administered 2020-06-28: 60 mg via INTRAVENOUS
  Administered 2020-06-28: 20 mg via INTRAVENOUS

## 2020-06-28 MED ORDER — CARVEDILOL 25 MG PO TABS
25.0000 mg | ORAL_TABLET | Freq: Every day | ORAL | Status: DC
  Administered 2020-06-29 – 2020-07-01 (×3): 25 mg via ORAL
  Filled 2020-06-28 (×3): qty 1

## 2020-06-28 MED ORDER — FERROUS SULFATE 325 (65 FE) MG PO TABS
325.0000 mg | ORAL_TABLET | Freq: Every day | ORAL | Status: DC
  Administered 2020-06-29 – 2020-07-01 (×3): 325 mg via ORAL
  Filled 2020-06-28 (×3): qty 1

## 2020-06-28 MED ORDER — BUPIVACAINE HCL (PF) 0.5 % IJ SOLN
INTRAMUSCULAR | Status: DC | PRN
  Administered 2020-06-28: 5 mL

## 2020-06-28 MED ORDER — SODIUM CHLORIDE 0.9% FLUSH
3.0000 mL | Freq: Two times a day (BID) | INTRAVENOUS | Status: DC
  Administered 2020-06-29 – 2020-07-01 (×5): 3 mL via INTRAVENOUS

## 2020-06-28 MED ORDER — ACETAMINOPHEN 500 MG PO TABS
1000.0000 mg | ORAL_TABLET | Freq: Four times a day (QID) | ORAL | Status: AC
  Administered 2020-06-28 – 2020-06-29 (×4): 1000 mg via ORAL
  Filled 2020-06-28 (×4): qty 2

## 2020-06-28 MED ORDER — MENTHOL 3 MG MT LOZG: 1.0000 | LOZENGE | OROMUCOSAL | Status: DC | PRN

## 2020-06-28 MED ORDER — PREGABALIN 100 MG PO CAPS
200.0000 mg | ORAL_CAPSULE | Freq: Three times a day (TID) | ORAL | Status: DC
  Administered 2020-06-28 – 2020-07-01 (×8): 200 mg via ORAL
  Filled 2020-06-28 (×8): qty 2

## 2020-06-28 MED ORDER — ONDANSETRON HCL 4 MG/2ML IJ SOLN
INTRAMUSCULAR | Status: DC | PRN
  Administered 2020-06-28: 4 mg via INTRAVENOUS

## 2020-06-28 MED ORDER — PANTOPRAZOLE SODIUM 40 MG PO TBEC
40.0000 mg | DELAYED_RELEASE_TABLET | Freq: Every day | ORAL | Status: DC
  Administered 2020-06-29 – 2020-07-01 (×3): 40 mg via ORAL
  Filled 2020-06-28 (×3): qty 1

## 2020-06-28 MED ORDER — SODIUM CHLORIDE 0.9 % IV SOLN: 250.0000 mL | INTRAVENOUS | Status: DC

## 2020-06-28 MED ORDER — 0.9 % SODIUM CHLORIDE (POUR BTL) OPTIME
TOPICAL | Status: DC | PRN
  Administered 2020-06-28: 1000 mL

## 2020-06-28 MED ORDER — THROMBIN 5000 UNITS EX SOLR
CUTANEOUS | Status: AC
  Filled 2020-06-28: qty 5000

## 2020-06-28 MED ORDER — SENNA 8.6 MG PO TABS
1.0000 | ORAL_TABLET | Freq: Two times a day (BID) | ORAL | Status: DC
  Administered 2020-06-28 – 2020-07-01 (×6): 8.6 mg via ORAL
  Filled 2020-06-28 (×6): qty 1

## 2020-06-28 MED ORDER — SUGAMMADEX SODIUM 200 MG/2ML IV SOLN
INTRAVENOUS | Status: DC | PRN
  Administered 2020-06-28: 400 mg via INTRAVENOUS

## 2020-06-28 MED ORDER — PROPOFOL 10 MG/ML IV BOLUS
INTRAVENOUS | Status: DC | PRN
  Administered 2020-06-28: 150 mg via INTRAVENOUS

## 2020-06-28 MED ORDER — TORSEMIDE 20 MG PO TABS
20.0000 mg | ORAL_TABLET | Freq: Every day | ORAL | Status: DC
  Administered 2020-06-29 – 2020-07-01 (×3): 20 mg via ORAL
  Filled 2020-06-28 (×3): qty 1

## 2020-06-28 MED ORDER — MIDAZOLAM HCL 2 MG/2ML IJ SOLN
INTRAMUSCULAR | Status: AC
  Filled 2020-06-28: qty 2

## 2020-06-28 MED ORDER — LIDOCAINE 2% (20 MG/ML) 5 ML SYRINGE
INTRAMUSCULAR | Status: DC | PRN
  Administered 2020-06-28: 60 mg via INTRAVENOUS

## 2020-06-28 MED ORDER — ORAL CARE MOUTH RINSE: 15.0000 mL | Freq: Once | OROMUCOSAL | Status: AC

## 2020-06-28 MED ORDER — ACETAMINOPHEN 325 MG PO TABS
650.0000 mg | ORAL_TABLET | ORAL | Status: DC | PRN
  Administered 2020-06-30 – 2020-07-01 (×3): 650 mg via ORAL
  Filled 2020-06-28 (×3): qty 2

## 2020-06-28 MED ORDER — OXYCODONE HCL 5 MG PO TABS: 5.0000 mg | ORAL_TABLET | Freq: Once | ORAL | Status: DC | PRN

## 2020-06-28 MED ORDER — BACITRACIN ZINC 500 UNIT/GM EX OINT
TOPICAL_OINTMENT | CUTANEOUS | Status: AC
  Filled 2020-06-28: qty 28.35

## 2020-06-28 MED ORDER — PROMETHAZINE HCL 25 MG/ML IJ SOLN: 6.2500 mg | INTRAMUSCULAR | Status: DC | PRN

## 2020-06-28 MED ORDER — METOLAZONE 5 MG PO TABS
5.0000 mg | ORAL_TABLET | ORAL | Status: DC
  Administered 2020-06-29 – 2020-07-01 (×2): 5 mg via ORAL
  Filled 2020-06-28 (×2): qty 1

## 2020-06-28 MED ORDER — PHENYLEPHRINE 40 MCG/ML (10ML) SYRINGE FOR IV PUSH (FOR BLOOD PRESSURE SUPPORT)
PREFILLED_SYRINGE | INTRAVENOUS | Status: DC | PRN
  Administered 2020-06-28 (×2): 120 ug via INTRAVENOUS

## 2020-06-28 MED ORDER — SPIRONOLACTONE 25 MG PO TABS
50.0000 mg | ORAL_TABLET | Freq: Every day | ORAL | Status: DC
  Administered 2020-06-29 – 2020-07-01 (×3): 50 mg via ORAL
  Filled 2020-06-28 (×3): qty 2

## 2020-06-28 MED ORDER — EPHEDRINE SULFATE 50 MG/ML IJ SOLN
INTRAMUSCULAR | Status: DC | PRN
  Administered 2020-06-28: 5 mg via INTRAVENOUS

## 2020-06-28 MED ORDER — OXYCODONE HCL 5 MG/5ML PO SOLN: 5.0000 mg | Freq: Once | ORAL | Status: DC | PRN

## 2020-06-28 MED ORDER — ALBUTEROL SULFATE HFA 108 (90 BASE) MCG/ACT IN AERS
INHALATION_SPRAY | RESPIRATORY_TRACT | Status: DC | PRN
  Administered 2020-06-28: 4 via RESPIRATORY_TRACT

## 2020-06-28 SURGICAL SUPPLY — 68 items
BAG DECANTER FOR FLEXI CONT (MISCELLANEOUS) ×3 IMPLANT
BASKET BONE COLLECTION (BASKET) ×3 IMPLANT
BENZOIN TINCTURE PRP APPL 2/3 (GAUZE/BANDAGES/DRESSINGS) IMPLANT
BLADE CLIPPER SURG (BLADE) IMPLANT
BLADE SURG 11 STRL SS (BLADE) ×3 IMPLANT
BUR MATCHSTICK NEURO 3.0 LAGG (BURR) ×3 IMPLANT
BUR PRECISION FLUTE 5.0 (BURR) ×3 IMPLANT
CAGE INTERBODY SHORT 7X29X24 (Cage) ×6 IMPLANT
CANISTER SUCT 3000ML PPV (MISCELLANEOUS) ×3 IMPLANT
CARTRIDGE OIL MAESTRO DRILL (MISCELLANEOUS) ×1 IMPLANT
CLOSURE WOUND 1/2 X4 (GAUZE/BANDAGES/DRESSINGS)
CNTNR URN SCR LID CUP LEK RST (MISCELLANEOUS) ×1 IMPLANT
CONT SPEC 4OZ STRL OR WHT (MISCELLANEOUS) ×2
COVER BACK TABLE 60X90IN (DRAPES) ×3 IMPLANT
COVER WAND RF STERILE (DRAPES) ×3 IMPLANT
DECANTER SPIKE VIAL GLASS SM (MISCELLANEOUS) ×3 IMPLANT
DERMABOND ADVANCED (GAUZE/BANDAGES/DRESSINGS) ×2
DERMABOND ADVANCED .7 DNX12 (GAUZE/BANDAGES/DRESSINGS) ×1 IMPLANT
DIFFUSER DRILL AIR PNEUMATIC (MISCELLANEOUS) ×3 IMPLANT
DRAPE C-ARM 42X72 X-RAY (DRAPES) ×3 IMPLANT
DRAPE C-ARMOR (DRAPES) ×3 IMPLANT
DRAPE LAPAROTOMY 100X72X124 (DRAPES) ×3 IMPLANT
DRAPE SURG 17X23 STRL (DRAPES) ×3 IMPLANT
DRSG OPSITE POSTOP 4X6 (GAUZE/BANDAGES/DRESSINGS) ×3 IMPLANT
DURAPREP 26ML APPLICATOR (WOUND CARE) ×3 IMPLANT
ELECT REM PT RETURN 9FT ADLT (ELECTROSURGICAL) ×3
ELECTRODE REM PT RTRN 9FT ADLT (ELECTROSURGICAL) ×1 IMPLANT
GAUZE 4X4 16PLY RFD (DISPOSABLE) IMPLANT
GAUZE SPONGE 4X4 12PLY STRL (GAUZE/BANDAGES/DRESSINGS) IMPLANT
GLOVE BIO SURGEON STRL SZ7.5 (GLOVE) IMPLANT
GLOVE BIOGEL PI IND STRL 7.5 (GLOVE) ×3 IMPLANT
GLOVE BIOGEL PI IND STRL 8 (GLOVE) ×5 IMPLANT
GLOVE BIOGEL PI INDICATOR 7.5 (GLOVE) ×6
GLOVE BIOGEL PI INDICATOR 8 (GLOVE) ×10
GLOVE ECLIPSE 7.0 STRL STRAW (GLOVE) ×6 IMPLANT
GLOVE ECLIPSE 7.5 STRL STRAW (GLOVE) ×18 IMPLANT
GOWN STRL REUS W/ TWL LRG LVL3 (GOWN DISPOSABLE) ×3 IMPLANT
GOWN STRL REUS W/ TWL XL LVL3 (GOWN DISPOSABLE) ×1 IMPLANT
GOWN STRL REUS W/TWL 2XL LVL3 (GOWN DISPOSABLE) ×6 IMPLANT
GOWN STRL REUS W/TWL LRG LVL3 (GOWN DISPOSABLE) ×6
GOWN STRL REUS W/TWL XL LVL3 (GOWN DISPOSABLE) ×2
HEMOSTAT POWDER KIT SURGIFOAM (HEMOSTASIS) ×3 IMPLANT
KIT BASIN OR (CUSTOM PROCEDURE TRAY) ×3 IMPLANT
KIT INFUSE XX SMALL 0.7CC (Orthopedic Implant) ×3 IMPLANT
KIT POSITION SURG JACKSON T1 (MISCELLANEOUS) ×3 IMPLANT
KIT TURNOVER KIT B (KITS) ×3 IMPLANT
MILL MEDIUM DISP (BLADE) ×3 IMPLANT
NEEDLE HYPO 22GX1.5 SAFETY (NEEDLE) ×3 IMPLANT
NEEDLE SPNL 18GX3.5 QUINCKE PK (NEEDLE) ×3 IMPLANT
NS IRRIG 1000ML POUR BTL (IV SOLUTION) ×3 IMPLANT
OIL CARTRIDGE MAESTRO DRILL (MISCELLANEOUS) ×3
PACK LAMINECTOMY NEURO (CUSTOM PROCEDURE TRAY) ×3 IMPLANT
PASTE BONE GRAFTON 5CC (Bone Implant) ×3 IMPLANT
ROD CC 30MM (Rod) ×6 IMPLANT
SCREW 5.5X35MM (Screw) ×8 IMPLANT
SCREW BN 35X5.5XMA NS SPNE (Screw) ×4 IMPLANT
SCREW SET SOLERA (Screw) ×8 IMPLANT
SCREW SET SOLERA TI (Screw) ×4 IMPLANT
SPONGE LAP 4X18 RFD (DISPOSABLE) IMPLANT
STRIP CLOSURE SKIN 1/2X4 (GAUZE/BANDAGES/DRESSINGS) IMPLANT
SUT VIC AB 0 CT1 18XCR BRD8 (SUTURE) ×2 IMPLANT
SUT VIC AB 0 CT1 8-18 (SUTURE) ×4
SUT VICRYL 3-0 RB1 18 ABS (SUTURE) ×6 IMPLANT
SYR 3ML LL SCALE MARK (SYRINGE) ×9 IMPLANT
TOWEL GREEN STERILE (TOWEL DISPOSABLE) ×3 IMPLANT
TOWEL GREEN STERILE FF (TOWEL DISPOSABLE) ×3 IMPLANT
TRAY FOLEY MTR SLVR 16FR STAT (SET/KITS/TRAYS/PACK) ×3 IMPLANT
WATER STERILE IRR 1000ML POUR (IV SOLUTION) ×3 IMPLANT

## 2020-06-28 NOTE — Progress Notes (Signed)
Pt crying inconsolably. Unable to finish admission assessment. Pt stated she didn't think the doctor did anything because she still hurts so much. This nurse advised patient she has a bandage on her back over her incision and that she did have the surgery. Pt continue to cry. Bed in lowest position and locked call bell with in reach. Mayford Knife RN

## 2020-06-28 NOTE — Progress Notes (Signed)
Hypoglycemic Event  CBG: 74  Treatment: D50 25 mL (12.5 gm)  Symptoms: None  Follow-up CBG: Time: CBG Result:  Possible Reasons for Event: Unknown  Comments/MD notified: Dr. Mal Amabile Made aware. Verbal order received for 57mL D50    Inda Coke, RN

## 2020-06-28 NOTE — Progress Notes (Signed)
Orthopedic Tech Progress Note Patient Details:  [name] [birth year] [id number]  Ordered patient brace Patient ID: Brenda Morgan, female   DOB: [birth year], 64 y.o.   MRN: [id number]  [name] 06/28/2020, 6:30 PM

## 2020-06-28 NOTE — Brief Op Note (Signed)
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 Lisbeth Renshaw, MD  ASSISTANT: Cindra Presume, PA-C  ANESTHESIA: GETA  EBL: 150cc  SPECIMENS: None  DRAINS: None  COMPLICATIONS: None immediate  CONDITION: Hemodynamically stable to PACU

## 2020-06-28 NOTE — Progress Notes (Addendum)
Pharmacy Antibiotic Note  Brenda Morgan is a 64 y.o. female admitted on 06/28/2020 with spondylolisthesis, L4-5; and spinal stenosis without neurogenic claudication, L4-5. Pt is S/P L4-5 decompression/fusion surgery this afternoon.  Pharmacy has been consulted for vancomycin dosing for post-op surgical prophylaxis.  WBC 11.7, afebrile; Scr 1.5, CrCl 44.1 ml/min  Pt rec'd cefazolin 2 gm IV X 1 pre op at 11:39 AM today. Per surgeon note, pt has no drains in place post op.  Plan: Vancomycin 1 gram IV X 1 at 2000 PM this evening (~8 hrs after pre-op cefazolin dose)  Height: 5\' 6"  (167.6 cm) Weight: 90.7 kg (200 lb) IBW/kg (Calculated) : 59.3  Temp (24hrs), Avg:97.8 F (36.6 C), Min:97.3 F (36.3 C), Max:98.3 F (36.8 C)  Recent Labs  Lab 06/24/20 1200  WBC 11.7*  CREATININE 1.50*    Estimated Creatinine Clearance: 44.1 mL/min (A) (by C-G formula based on SCr of 1.5 mg/dL (H)).    Allergies  Allergen Reactions  . Amlodipine Anaphylaxis, Swelling and Other (See Comments)    Tongue swelling   . Naltrexone Hives  . Penicillins Hives and Other (See Comments)    Tolerated ceftriaxone and cefepime 06/2017 Hives      Microbiology results: 7/2 MRSA PCR: positive 7/2 COVID: negative   Thank you for allowing pharmacy to be a part of this patient's care.  9/2, PharmD, BCPS, [facility] Clinical Pharmacist 06/28/2020 6:59 PM

## 2020-06-28 NOTE — Anesthesia Postprocedure Evaluation (Signed)
Anesthesia Post Note  Patient: Lexicographer  Procedure(s) Performed: POSTERIOR LUMBAR INTERBODY FUSION LUMBAR FOUR- LUMBAR FIVE, POSTERIOR NO-SEGMENTAL INSTRUMENTATION (N/A )     Patient location during evaluation: PACU Anesthesia Type: General Level of consciousness: sedated, responds to stimulation and patient cooperative (At baseline somnolence) Pain management: pain level controlled Vital Signs Assessment: post-procedure vital signs reviewed and stable Respiratory status: spontaneous breathing, nonlabored ventilation and respiratory function stable Cardiovascular status: blood pressure returned to baseline and stable Postop Assessment: no apparent nausea or vomiting Anesthetic complications: no   No complications documented.  Last Vitals:  Vitals:   06/28/20 1540 06/28/20 1555  BP: 117/75 122/60  Pulse: 80 77  Resp: (!) 23 11  Temp:    SpO2: 99% 98%    Last Pain:  Vitals:   06/28/20 1555  TempSrc:   PainSc: Asleep                 Beryle Lathe

## 2020-06-28 NOTE — Transfer of Care (Signed)
Immediate Anesthesia Transfer of Care Note  Patient: Brenda Morgan  Procedure(s) Performed: POSTERIOR LUMBAR INTERBODY FUSION LUMBAR FOUR- LUMBAR FIVE, POSTERIOR NO-SEGMENTAL INSTRUMENTATION (N/A )  Patient Location: PACU  Anesthesia Type:General  Level of Consciousness: drowsy  Airway & Oxygen Therapy: Patient Spontanous Breathing and Patient connected to face mask oxygen  Post-op Assessment: Report given to RN and Post -op Vital signs reviewed and stable  Post vital signs: Reviewed and stable  Last Vitals:  Vitals Value Taken Time  BP    Temp    Pulse 71 06/28/20 1442  Resp 24 06/28/20 1442  SpO2 96 % 06/28/20 1442  Vitals shown include unvalidated device data.  Last Pain:  Vitals:   06/28/20 0925  TempSrc:   PainSc: 10-Worst pain ever      Patients Stated Pain Goal: 3 (06/28/20 0925)  Complications: No complications documented.

## 2020-06-28 NOTE — Anesthesia Procedure Notes (Signed)
Procedure Name: Intubation Date/Time: 06/28/2020 11:37 AM Performed by: Bryson Corona, CRNA Pre-anesthesia Checklist: Patient identified, Emergency Drugs available, Suction available and Patient being monitored Patient Re-evaluated:Patient Re-evaluated prior to induction Oxygen Delivery Method: Circle System Utilized Preoxygenation: Pre-oxygenation with 100% oxygen Induction Type: IV induction Ventilation: Oral airway inserted - appropriate to patient size Laryngoscope Size: Mac and 3 Grade View: Grade I Tube type: Oral Tube size: 7.0 mm Number of attempts: 1 Airway Equipment and Method: Stylet and Oral airway Placement Confirmation: ETT inserted through vocal cords under direct vision,  positive ETCO2 and breath sounds checked- equal and bilateral Secured at: 21 cm Tube secured with: Tape Dental Injury: Teeth and Oropharynx as per pre-operative assessment

## 2020-06-28 NOTE — Anesthesia Preprocedure Evaluation (Addendum)
Anesthesia Evaluation  Patient identified by MRN, date of birth, ID bandGeneral Assessment Comment: Somnolent, as reported in PAT, which is her baseline. Awakens easily, but gentle nudges required frequently during preop visit to re-wake patient   Reviewed: Allergy & Precautions, NPO status , Patient's Chart, lab work & pertinent test results  History of Anesthesia Complications Negative for: history of anesthetic complications  Airway Mallampati: III  TM Distance: >3 FB Neck ROM: Full    Dental  (+) Edentulous Lower, Edentulous Upper   Pulmonary sleep apnea (noncompliant with CPAP) , COPD,  COPD inhaler, Current SmokerPatient did not abstain from smoking.,    Pulmonary exam normal        Cardiovascular negative cardio ROS Normal cardiovascular exam     Neuro/Psych negative neurological ROS  negative psych ROS   GI/Hepatic Neg liver ROS, GERD  Medicated and Controlled,  Endo/Other  diabetes, Type 2, Insulin Dependent, Oral Hypoglycemic Agents Obesity   Renal/GU Renal InsufficiencyRenal disease     Musculoskeletal negative musculoskeletal ROS (+)   Abdominal   Peds  Hematology negative hematology ROS (+)   Anesthesia Other Findings Covid test negative   Reproductive/Obstetrics                            Anesthesia Physical Anesthesia Plan  ASA: III  Anesthesia Plan: General   Post-op Pain Management:    Induction: Intravenous  PONV Risk Score and Plan: 4 or greater and Treatment may vary due to age or medical condition, Ondansetron and Dexamethasone  Airway Management Planned: Oral ETT  Additional Equipment: None  Intra-op Plan:   Post-operative Plan: Extubation in OR  Informed Consent: I have reviewed the patients History and Physical, chart, labs and discussed the procedure including the risks, benefits and alternatives for the proposed anesthesia with the patient or  authorized representative who has indicated his/her understanding and acceptance.     Dental advisory given  Plan Discussed with: CRNA and Anesthesiologist  Anesthesia Plan Comments: (Minimize sedating medications due to excessive somnolence related to untreated OSA )       Anesthesia Quick Evaluation

## 2020-06-29 LAB — CBC
HCT: 36.5 % (ref 36.0–46.0)
Hemoglobin: 11.7 g/dL — ABNORMAL LOW (ref 12.0–15.0)
MCH: 30.2 pg (ref 26.0–34.0)
MCHC: 32.1 g/dL (ref 30.0–36.0)
MCV: 94.1 fL (ref 80.0–100.0)
Platelets: 171 10*3/uL (ref 150–400)
RBC: 3.88 MIL/uL (ref 3.87–5.11)
RDW: 14.6 % (ref 11.5–15.5)
WBC: 12 10*3/uL — ABNORMAL HIGH (ref 4.0–10.5)
nRBC: 0 % (ref 0.0–0.2)

## 2020-06-29 LAB — PROTIME-INR
INR: 1.1 (ref 0.8–1.2)
Prothrombin Time: 13.7 seconds (ref 11.4–15.2)

## 2020-06-29 LAB — APTT: aPTT: 32 seconds (ref 24–36)

## 2020-06-29 LAB — BASIC METABOLIC PANEL
Anion gap: 9 (ref 5–15)
BUN: 18 mg/dL (ref 8–23)
CO2: 30 mmol/L (ref 22–32)
Calcium: 8.8 mg/dL — ABNORMAL LOW (ref 8.9–10.3)
Chloride: 99 mmol/L (ref 98–111)
Creatinine, Ser: 0.92 mg/dL (ref 0.44–1.00)
GFR calc Af Amer: >60 mL/min (ref >60)
GFR calc non Af Amer: >60 mL/min (ref >60)
Glucose, Bld: 159 mg/dL — ABNORMAL HIGH (ref 70–99)
Potassium: 3.9 mmol/L (ref 3.5–5.1)
Sodium: 138 mmol/L (ref 135–145)

## 2020-06-29 MED ORDER — LORAZEPAM 2 MG/ML IJ SOLN: 0.5000 mg | Freq: Four times a day (QID) | INTRAMUSCULAR | Status: DC | PRN

## 2020-06-29 NOTE — Evaluation (Signed)
Occupational Therapy Evaluation Patient Details Name: Brenda Morgan MRN: [id number] DOB: October 12, 1956 Today's Date: 06/29/2020    History of Present Illness 64 y.o. female with longstanding history of lower back and right greater than left leg pain.  Pain is severe and interferes with ADLs.  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 Pt underwent L4-5 posterior decompression and fusion on 06/28/2020.   Clinical Impression   Pt admitted with the above diagnoses and presents with below problem list. Pt will benefit from continued acute OT to address the below listed deficits and maximize independence with basic ADLs prior to d/c home with spouse. PTA pt was mod I with ADLs. Pt very lethargic today but able to arouse briefly to answer a question at a time. Spouse reports this occurred sometimes PTA as well. Pt currently min A with most ADLs. Began education on back precautions and strategies for ADLs. Inconsistent bp and pulse readings throughout session, ultimately found to have low bp reading 98/58. Transferred back to bed after [facility] transfer. Pt eating lunch at end of session.      Follow Up Recommendations  Home health OT;Supervision/Assistance - 24 hour    Equipment Recommendations  None recommended by OT    Recommendations for Other Services       Precautions / Restrictions Precautions Precautions: Fall;Back Precaution Booklet Issued: Yes (comment) Precaution Comments: reviewed back precautions with pt and spouse Required Braces or Orthoses: Spinal Brace Spinal Brace: Lumbar corset Restrictions Weight Bearing Restrictions: No      Mobility Bed Mobility Overal bed mobility: Needs Assistance Bed Mobility: Sit to Supine       Sit to supine: Min assist   General bed mobility comments: assist to fully powerup BLE onto bed. guiding assist to maintain back precautions.   Transfers Overall transfer level: Needs  assistance Equipment used: Rolling walker (2 wheeled) Transfers: Sit to/from [employer] Sit to Stand: Min assist Stand pivot transfers: Min guard;Min assist       General transfer comment: recliner>BSC>EOB. steadying assist. cues 2/2 lethargy    Balance                                           ADL either performed or assessed with clinical judgement   ADL Overall ADL's : Needs assistance/impaired Eating/Feeding: Set up;Sitting   Grooming: Set up;Minimal assistance;Sitting   Upper Body Bathing: Minimal assistance;Sitting   Lower Body Bathing: Minimal assistance;Sit to/from stand   Upper Body Dressing : Set up;Sitting;Minimal assistance   Lower Body Dressing: Minimal assistance;Sit to/from stand   Toilet Transfer: Stand-pivot;BSC;RW;Min Sports administrator Details (indicate cue type and reason): min guard for safety. light steadying assist. Toileting- Clothing Manipulation and Hygiene: Minimal assistance;Sit to/from stand         General ADL Comments: Pt received in recliner. Lethargic. Completed SPT to [facility], pericare in standing, then pivotal steps to recliner.      Vision         Perception     Praxis      Pertinent Vitals/Pain Pain Assessment: Faces Faces Pain Scale: Hurts a little bit Pain Location: back Pain Descriptors / Indicators: Aching Pain Intervention(s): Monitored during session;Repositioned     Hand Dominance     Extremity/Trunk Assessment Upper Extremity Assessment Upper Extremity Assessment: Generalized weakness (tremulous movements at baseline per pt and spouse  report)   Lower Extremity Assessment Lower Extremity Assessment: Defer to PT evaluation RLE Sensation: decreased light Morgan LLE Sensation: decreased light Morgan   Cervical / Trunk Assessment Cervical / Trunk Assessment: Other exceptions (s/p lumbar fusion, )   Communication Communication Communication: No  difficulties;Other (comment) (lethargic)   Cognition Arousal/Alertness: Lethargic Behavior During Therapy: Flat affect Overall Cognitive Status: Impaired/Different from baseline Area of Impairment: Attention;Problem solving                   Current Attention Level: Sustained         Problem Solving: Slow processing;Requires verbal cues General Comments: lethargic throughout session though able to briefly arouse to answer a question before immediately falling back asleep. Spouse present for last half of session and reports pt "does this kind of thing" at home at times as well. Pt A&O. Delayed responses.    General Comments  Received on 2L, able to titrate down to 1L O2 with sats in upper 90s. Inconsistent bp and pulse readings at times during session. Manual bp check by nurse during session with bp running a little low after initially appearing to run high.     Exercises     Shoulder Instructions      Home Living Family/patient expects to be discharged to:: Private residence Living Arrangements: Spouse/significant other Available Help at Discharge: Family;Available 24 hours/day Type of Home: Mobile home Home Access: Ramped entrance     Home Layout: One level     Bathroom Shower/Tub: Producer, television/film/video: Handicapped height     Home Equipment: Environmental consultant - 2 wheels;Shower seat;Grab bars - toilet;Other (comment);Adaptive equipment (upright walker) Adaptive Equipment: Reacher        Prior Functioning/Environment Level of Independence: Independent with assistive device(s)        Comments: pt utilizies upright walker or RW for mobility, sits for bathing        OT Problem List: Decreased strength;Decreased activity tolerance;Impaired balance (sitting and/or standing);Decreased knowledge of use of DME or AE;Decreased knowledge of precautions;Decreased cognition;Cardiopulmonary status limiting activity;Obesity;Pain      OT Treatment/Interventions:  Self-care/ADL training;Therapeutic exercise;Energy conservation;DME and/or AE instruction;Therapeutic activities;Patient/family education;Balance training    OT Goals(Current goals can be found in the care plan section) Acute Rehab OT Goals Patient Stated Goal: To improve mobility and go home OT Goal Formulation: With patient/family Time For Goal Achievement: 07/13/20 Potential to Achieve Goals: Good ADL Goals Pt Will Perform Grooming: with set-up;sitting Pt Will Perform Upper Body Dressing: with set-up;sitting Pt Will Perform Lower Body Dressing: with modified independence;sit to/from stand Pt Will Transfer to Toilet: with supervision;ambulating Pt Will Perform Toileting - Clothing Manipulation and hygiene: with min guard assist;sit to/from stand Additional ADL Goal #1: Pt will complete bed mobility at supervision level to prepare for OOB/EOB ADLs.  OT Frequency: Min 2X/week   Barriers to D/C:            Co-evaluation              AM-PAC OT "6 Clicks" Daily Activity     Outcome Measure Help from another person eating meals?: None Help from another person taking care of personal grooming?: A Little Help from another person toileting, which includes using toliet, bedpan, or urinal?: A Little Help from another person bathing (including washing, rinsing, drying)?: A Little Help from another person to put on and taking off regular upper body clothing?: A Little Help from another person to put on and taking off regular lower body  clothing?: A Little 6 Click Score: 19   End of Session Equipment Utilized During Treatment: Rolling walker;Gait belt;Oxygen;Back brace Nurse Communication: Other (comment) (questionable, inconsistent vitals at start of session)  Activity Tolerance: Patient limited by lethargy Patient left: in bed;with call bell/phone within reach;with bed alarm set;with family/visitor present;with SCD's reapplied  OT Visit Diagnosis: Unsteadiness on feet (R26.81);Other  abnormalities of gait and mobility (R26.89);Muscle weakness (generalized) (M62.81);Pain;Other symptoms and signs involving the nervous system (R29.898)                Time: 0347-4259 OT Time Calculation (min): 45 min Charges:  OT General Charges $OT Visit: 1 Visit OT Evaluation $OT Eval Moderate Complexity: 1 Mod OT Treatments $Self Care/Home Management : 8-22 mins  Raynald Kemp, OT Acute Rehabilitation Services Pager: [phone] Office: [phone]   Pilar Grammes 06/29/2020, 2:09 PM

## 2020-06-29 NOTE — Progress Notes (Signed)
  NEUROSURGERY PROGRESS NOTE   No issues overnight. Complains of appropriate back soreness Pre op leg pain improved Has not been up yet with therapy  EXAM:  BP (!) 114/53 (BP Location: Right Arm)   Pulse (!) 110   Temp 97.7 F (36.5 C) (Oral)   Resp 13   Ht 5\' 6"  (1.676 m)   Wt 90.7 kg   SpO2 97%   BMI 32.28 kg/m   Awake, alert, oriented  Speech fluent, appropriate  CN grossly intact  5/5 BUE/BLE  Incision: c/d/i  IMPRESSION/PLAN 64 y.o. female POD1 L4-5 fusion. Doing well. - PT/OT today - pain control, supportive care

## 2020-06-29 NOTE — Evaluation (Signed)
Physical Therapy Evaluation Patient Details Name: Brenda Morgan MRN: [id number] DOB: April 17, 1956 Today's Date: 06/29/2020   History of Present Illness  64 y.o. female with longstanding history of lower back and right greater than left leg pain.  Pain is severe and interferes with ADLs.  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 Pt underwent L4-5 posterior decompression and fusion on 06/28/2020.  Clinical Impression  Pt presents to PT with deficits in functional mobility, gait, balance, power, strength, endurance, level of arousal, and with significant back pain. Pt is lethargic during session but does arouse to verbal cues, intermittently falling asleep during history. Pt is generally weak at this time, requiring some physical assistance to transfer, and limited in ambulation distance by pain and lethargy. Pt will benefit from continued acute PT services to improve gait and mobility quality and to reduce falls risk. PT anticipates the pt will progress quickly and be able to discharge home with HHPT services, no current DME needs anticipated.    Follow Up Recommendations Home health PT;Supervision for mobility/OOB    Equipment Recommendations  None recommended by PT    Recommendations for Other Services       Precautions / Restrictions Precautions Precautions: Fall;Back Precaution Booklet Issued: No Precaution Comments: PT verbally reviews back precautions and brace used Required Braces or Orthoses: Spinal Brace Spinal Brace: Lumbar corset (on upon arrival) Restrictions Weight Bearing Restrictions: No      Mobility  Bed Mobility Overal bed mobility: Needs Assistance Bed Mobility: Rolling;Sidelying to Sit Rolling: Min guard Sidelying to sit: Min guard          Transfers Overall transfer level: Needs assistance Equipment used: Rolling walker (2 wheeled) Transfers: Sit to/from Stand Sit to Stand: Min assist             Ambulation/Gait Ambulation/Gait assistance: Min guard Gait Distance (Feet): 5 Feet Assistive device: Rolling walker (2 wheeled) Gait Pattern/deviations: Step-to pattern Gait velocity: reduced Gait velocity interpretation: <1.8 ft/sec, indicate of risk for recurrent falls General Gait Details: pt with short step to gait, requires verbal cues for direction  Stairs            Wheelchair Mobility    Modified Rankin (Stroke Patients Only)       Balance                                             Pertinent Vitals/Pain Pain Assessment: 0-10 Pain Score: 10-Worst pain ever Pain Location: back Pain Descriptors / Indicators: Aching Pain Intervention(s): Premedicated before session    Home Living Family/patient expects to be discharged to:: Private residence Living Arrangements: Spouse/significant other Available Help at Discharge: Family;Available 24 hours/day Type of Home: Mobile home Home Access: Ramped entrance     Home Layout: One level Home Equipment: Walker - 2 wheels;Shower seat (upright walker)      Prior Function Level of Independence: Independent with assistive device(s)         Comments: pt utilizies upright walker or RW for mobility, sits for bathing     Hand Dominance        Extremity/Trunk Assessment   Upper Extremity Assessment Upper Extremity Assessment: Overall WFL for tasks assessed    Lower Extremity Assessment Lower Extremity Assessment: Generalized weakness;RLE deficits/detail;LLE deficits/detail RLE Sensation: decreased light touch LLE Sensation: decreased light touch    Cervical /  Trunk Assessment Cervical / Trunk Assessment: Normal  Communication   Communication: No difficulties  Cognition Arousal/Alertness: Lethargic (arouses to stimuli but falling asleep with questions) Behavior During Therapy: WFL for tasks assessed/performed Overall Cognitive Status: Impaired/Different from baseline Area of  Impairment: Attention;Problem solving                   Current Attention Level: Sustained         Problem Solving: Slow processing        General Comments General comments (skin integrity, edema, etc.): Pt on 3L [place] upon arrival, reports she wears BiPAP occasionally with sleep but does not wear O2 otherwise at home. PT weans to room air and pt demonstrates no signs of increasd work of breathing. PT does return pt to 3L [place] at end of session as she is drowsy and likely to fall asleep at the end of session    Exercises     Assessment/Plan    PT Assessment Patient needs continued PT services  PT Problem List Decreased strength;Decreased activity tolerance;Decreased balance;Decreased mobility;Decreased cognition;Decreased knowledge of use of DME;Decreased safety awareness;Decreased knowledge of precautions;Pain;Impaired sensation       PT Treatment Interventions DME instruction;Gait training;Stair training;Functional mobility training;Therapeutic activities;Therapeutic exercise;Balance training;Neuromuscular re-education;Cognitive remediation;Patient/family education    PT Goals (Current goals can be found in the Care Plan section)  Acute Rehab PT Goals Patient Stated Goal: To improve mobility and go home PT Goal Formulation: With patient Time For Goal Achievement: 07/13/20 Potential to Achieve Goals: Good    Frequency Min 5X/week   Barriers to discharge        Co-evaluation               AM-PAC PT "6 Clicks" Mobility  Outcome Measure Help needed turning from your back to your side while in a flat bed without using bedrails?: A Little Help needed moving from lying on your back to sitting on the side of a flat bed without using bedrails?: A Little Help needed moving to and from a bed to a chair (including a wheelchair)?: A Little Help needed standing up from a chair using your arms (e.g., wheelchair or bedside chair)?: A Little Help needed to walk in hospital  room?: A Little Help needed climbing 3-5 steps with a railing? : A Lot 6 Click Score: 17    End of Session Equipment Utilized During Treatment: Oxygen;Back brace Activity Tolerance: Patient limited by lethargy Patient left: in chair;with call bell/phone within reach;with chair alarm set Nurse Communication: Mobility status PT Visit Diagnosis: Other abnormalities of gait and mobility (R26.89);Pain Pain - part of body:  (back)    Time: 7035-0093 PT Time Calculation (min) (ACUTE ONLY): 20 min   Charges:   PT Evaluation $PT Eval Moderate Complexity: 1 Mod          Arlyss Gandy, PT, DPT Acute Rehabilitation Pager: [phone]   Arlyss Gandy 06/29/2020, 9:22 AM

## 2020-06-30 MED ORDER — OXYCODONE HCL 5 MG PO TABS: 5.0000 mg | ORAL_TABLET | ORAL | 0 refills | Status: DC | PRN

## 2020-06-30 MED ORDER — METHOCARBAMOL 750 MG PO TABS
750.0000 mg | ORAL_TABLET | Freq: Three times a day (TID) | ORAL | 1 refills | Status: DC | PRN
Start: 2020-06-30 — End: 2020-10-14

## 2020-06-30 NOTE — Progress Notes (Signed)
[place] has moderate-large amount of old drainage. Drainage marked, but appears to be seeping out of dressing. Pt c/o itching around site. Dressing left intact and reinforced with ABD pad and tape.   Robina Ade, RN

## 2020-06-30 NOTE — NC FL2 (Signed)
[place] MEDICAID FL2 LEVEL OF CARE SCREENING TOOL     IDENTIFICATION  Patient Name: Brenda Morgan Birthdate: 12-15-1956 Sex: female Admission Date (Current Location): 06/28/2020  [facility] and [place] Number:  Producer, television/film/video and Address:  [facility] [place], [address] [place], [place] [address]      Provider Number: [id number]  Attending Physician Name and Address:  Lisbeth Renshaw, MD  Relative Name and Phone Number:  Fayrene Fearing, spouse [phone]    Current Level of Care: Hospital Recommended Level of Care: Skilled Nursing Facility Prior Approval Number:    Date Approved/Denied:   PASRR Number:    Discharge Plan: SNF    Current Diagnoses: Patient Active Problem List   Diagnosis Date Noted  . Lumbar spinal stenosis 06/28/2020    Orientation RESPIRATION BLADDER Height & Weight     Self, Time, Situation, Place  O2 (Nasal cannula 1L) Continent Weight: 200 lb (90.7 kg) Height:  5\' 6"  (167.6 cm)  BEHAVIORAL SYMPTOMS/MOOD NEUROLOGICAL BOWEL NUTRITION STATUS      Continent Diet (Please see DC Summary)  AMBULATORY STATUS COMMUNICATION OF NEEDS Skin   Limited Assist Verbally Surgical wounds (Closed incision on back)                       Personal Care Assistance Level of Assistance  Bathing, Feeding, Dressing Bathing Assistance: Limited assistance Feeding assistance: Independent Dressing Assistance: Limited assistance     Functional Limitations Info  Sight, Hearing, Speech Sight Info: Adequate Hearing Info: Adequate Speech Info: Adequate    SPECIAL CARE FACTORS FREQUENCY  PT (By licensed PT), OT (By licensed OT)     PT Frequency: 5x/week OT Frequency: 5x/week            Contractures Contractures Info: Not present    Additional Factors Info  Code Status, Allergies, Psychotropic, Isolation Precautions Code Status Info: Full Allergies Info: Amlodipine, Naltrexone, Penicillins Psychotropic Info: Lyrica   Isolation  Precautions Info: MRSA     Current Medications (06/30/2020):  This is the current hospital active medication list Current Facility-Administered Medications  Medication Dose Route Frequency Provider Last Rate Last Admin  . 0.9 %  sodium chloride infusion   Intravenous Continuous Costella, Vincent J, PA-C      . 0.9 %  sodium chloride infusion  250 mL Intravenous Continuous Costella, 08/31/2020, PA-C      . acetaminophen (TYLENOL) tablet 650 mg  650 mg Oral Q4H PRN Darci Current, PA-C   650 mg at 06/30/20 08/31/20   Or  . acetaminophen (TYLENOL) suppository 650 mg  650 mg Rectal Q4H PRN Costella, 1660, PA-C      . atorvastatin (LIPITOR) tablet 5 mg  5 mg Oral Daily Costella, Vincent J, PA-C   5 mg at 06/30/20 0854  . baclofen (LIORESAL) tablet 20 mg  20 mg Oral TID Costella09/08/21, PA-C   20 mg at 06/30/20 0854  . bisacodyl (DULCOLAX) suppository 10 mg  10 mg Rectal Daily PRN Costella, 08/31/20, PA-C      . carvedilol (COREG) tablet 25 mg  25 mg Oral Daily Costella, Darci Current, PA-C   25 mg at 06/30/20 0856  . Chlorhexidine Gluconate Cloth 2 % PADS 6 each  6 each Topical Once Costella, 08/31/20, PA-C       And  . Chlorhexidine Gluconate Cloth 2 % PADS 6 each  6 each Topical Once Costella, Darci Current, PA-C      .  docusate sodium (COLACE) capsule 100 mg  100 mg Oral BID Alyson Ingles, PA-C   100 mg at 06/30/20 0856  . famotidine (PEPCID) tablet 40 mg  40 mg Oral Daily Alyson Ingles, PA-C   40 mg at 06/30/20 0856  . ferrous sulfate tablet 325 mg  325 mg Oral Q breakfast Alyson Ingles, PA-C   325 mg at 06/30/20 0856  . HYDROcodone-acetaminophen (NORCO/VICODIN) 5-325 MG per tablet 1 tablet  1 tablet Oral Q4H PRN Alyson Ingles, PA-C   1 tablet at 06/29/20 2256  . HYDROmorphone (DILAUDID) injection 0.5-1 mg  0.5-1 mg Intravenous Q2H PRN Costella, Darci Current, PA-C   1 mg at 06/30/20 0851  . icosapent Ethyl (VASCEPA) 1 g capsule 1 g  1 g Oral BID Costella, Vincent J, PA-C    1 g at 06/30/20 0859  . ipratropium-albuterol (DUONEB) 0.5-2.5 (3) MG/3ML nebulizer solution 3 mL  3 mL Nebulization Q6H PRN Costella, Darci Current, PA-C      . lactated ringers infusion   Intravenous Continuous Alyson Ingles, PA-C 10 mL/hr at 06/28/20 0932 New Bag at 06/28/20 1330  . lactulose (CHRONULAC) 10 GM/15ML solution 10 g  10 g Oral TID Alyson Ingles, PA-C   10 g at 06/30/20 0853  . lamoTRIgine (LAMICTAL) tablet 150 mg  150 mg Oral Daily Alyson Ingles, PA-C   150 mg at 06/30/20 0853  . LORazepam (ATIVAN) injection 0.5 mg  0.5 mg Intravenous Q6H PRN Costella, Darci Current, PA-C      . magnesium oxide (MAG-OX) tablet 400 mg  400 mg Oral Daily Costella, Vincent J, PA-C   400 mg at 06/30/20 0854  . menthol-cetylpyridinium (CEPACOL) lozenge 3 mg  1 lozenge Oral PRN Costella, Darci Current, PA-C       Or  . phenol (CHLORASEPTIC) mouth spray 1 spray  1 spray Mouth/Throat PRN Costella, Darci Current, PA-C      . methocarbamol (ROBAXIN) tablet 500 mg  500 mg Oral Q6H PRN Costella, Vincent J, PA-C   500 mg at 06/30/20 0150   Or  . methocarbamol (ROBAXIN) 500 mg in dextrose 5 % 50 mL IVPB  500 mg Intravenous Q6H PRN Costella, Darci Current, PA-C      . metolazone (ZAROXOLYN) tablet 5 mg  5 mg Oral Once per day on Mon Wed Fri Alyson Ingles, PA-C   5 mg at 06/29/20 1204  . ondansetron (ZOFRAN) tablet 4 mg  4 mg Oral Q6H PRN Costella, Darci Current, PA-C       Or  . ondansetron (ZOFRAN) injection 4 mg  4 mg Intravenous Q6H PRN Costella, Vincent J, PA-C   4 mg at 06/28/20 2016  . oxyCODONE (Oxy IR/ROXICODONE) immediate release tablet 5-10 mg  5-10 mg Oral Q3H PRN Alyson Ingles, PA-C   10 mg at 06/30/20 4818  . pantoprazole (PROTONIX) EC tablet 40 mg  40 mg Oral Daily Alyson Ingles, PA-C   40 mg at 06/30/20 0856  . potassium chloride SA (KLOR-CON) CR tablet 20 mEq  20 mEq Oral Daily Alyson Ingles, PA-C   20 mEq at 06/30/20 0855  . pramipexole (MIRAPEX) tablet 1 mg  1 mg Oral QHS  Costella, Vincent J, PA-C   1 mg at 06/29/20 2105  . pregabalin (LYRICA) capsule 200 mg  200 mg Oral TID CostellaDarci Current, PA-C   200 mg at 06/30/20 0853  . senna (SENOKOT) tablet 8.6 mg  1 tablet Oral BID Costella,  Darci Current, PA-C   8.6 mg at 06/30/20 0853  . senna-docusate (Senokot-S) tablet 1 tablet  1 tablet Oral QHS PRN Costella, Vincent J, PA-C      . sodium chloride flush (NS) 0.9 % injection 3 mL  3 mL Intravenous Q12H Costella, Vincent J, PA-C   3 mL at 06/30/20 0851  . sodium chloride flush (NS) 0.9 % injection 3 mL  3 mL Intravenous PRN Costella, Darci Current, PA-C      . sodium phosphate (FLEET) 7-19 GM/118ML enema 1 enema  1 enema Rectal Once PRN Costella, Darci Current, PA-C      . spironolactone (ALDACTONE) tablet 50 mg  50 mg Oral Daily Costella, Vincent J, PA-C   50 mg at 06/30/20 0855  . torsemide (DEMADEX) tablet 20 mg  20 mg Oral Daily Costella, Darci Current, PA-C   20 mg at 06/30/20 2774     Discharge Medications: Please see discharge summary for a list of discharge medications.  Relevant Imaging Results:  Relevant Lab Results:   Additional Information SSN: [id number] [place], [place]

## 2020-06-30 NOTE — Progress Notes (Signed)
Physical Therapy Treatment Patient Details Name: Brenda Morgan MRN: [id number] DOB: 28-Feb-1956 Today's Date: 06/30/2020    History of Present Illness 64 y.o. female with longstanding history of lower back and right greater than left leg pain.  Pain is severe and interferes with ADLs.  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 Pt underwent L4-5 posterior decompression and fusion on 06/28/2020.    PT Comments    Pt limited by back pain this session. Pt with slow gait speed and is only able to tolerate short bouts of activity due to back pain at this time. Pt requires reinforcement of education on spinal precautions and requires assistance to don brace at this time. Pt now reporting she does not feel her spouse can provide sufficient physical assistance for her to discharge home in her current physical condition. Pt is at an increased falls risk at this time due to general weakness and due to mobility changes related to back pain. Pt will continue to benefit from acute PT POC to improve mobility quality and to reduce falls risk. PT updating D/C recommendations to SNF placement at this time.   Follow Up Recommendations  SNF     Equipment Recommendations  None recommended by PT    Recommendations for Other Services       Precautions / Restrictions Precautions Precautions: Fall;Back Precaution Booklet Issued: No Precaution Comments: verbally reviewed back precautions, pt able to recall 1/3 Required Braces or Orthoses: Spinal Brace Spinal Brace: Lumbar corset;Applied in sitting position Restrictions Weight Bearing Restrictions: No    Mobility  Bed Mobility Overal bed mobility: Needs Assistance Bed Mobility: Rolling;Sidelying to Sit Rolling: Supervision (use of rail) Sidelying to sit: Min assist       General bed mobility comments: pt requires cues for technique to reduce twisting  Transfers Overall transfer level: Needs  assistance Equipment used: Rolling walker (2 wheeled) Transfers: Sit to/from Stand Sit to Stand: Min assist         General transfer comment: minA to power up into standing  Ambulation/Gait Ambulation/Gait assistance: Min guard Gait Distance (Feet): 25 Feet (additional trial on 4') Assistive device: Rolling walker (2 wheeled) Gait Pattern/deviations: Step-to pattern;Trunk flexed Gait velocity: reduced Gait velocity interpretation: <1.31 ft/sec, indicative of household ambulator General Gait Details: pt with short step to gait, antalgic gait bilaterally, significantly reduced gait speed   Stairs             Wheelchair Mobility    Modified Rankin (Stroke Patients Only)       Balance Overall balance assessment: Needs assistance Sitting-balance support: No upper extremity supported;Feet supported Sitting balance-Leahy Scale: Fair     Standing balance support: Bilateral upper extremity supported Standing balance-Leahy Scale: Poor Standing balance comment: reliant on BUE support of RW                            Cognition Arousal/Alertness: Awake/alert Behavior During Therapy: Flat affect Overall Cognitive Status: Impaired/Different from baseline Area of Impairment: Memory;Safety/judgement;Awareness;Problem solving                     Memory: Decreased recall of precautions;Decreased short-term memory   Safety/Judgement: Decreased awareness of safety;Decreased awareness of deficits Awareness: Emergent Problem Solving: Slow processing        Exercises      General Comments General comments (skin integrity, edema, etc.): pt on 1L [place] upon arrival, PT attempts to  wean to RA but pt desats to 84% at rest. Pt saturating in low 90s on 1L [place] with activity and back in mid to high 90s at rest      Pertinent Vitals/Pain Pain Assessment: 0-10 Pain Score: 9  Pain Location: low back Pain Descriptors / Indicators: Grimacing Pain Intervention(s):  Monitored during session    Home Living                      Prior Function            PT Goals (current goals can now be found in the care plan section) Acute Rehab PT Goals Patient Stated Goal: to go to rehab and get stronger Progress towards PT goals: Progressing toward goals (slowly)    Frequency    Min 5X/week      PT Plan Discharge plan needs to be updated    Co-evaluation              AM-PAC PT "6 Clicks" Mobility   Outcome Measure  Help needed turning from your back to your side while in a flat bed without using bedrails?: A Little Help needed moving from lying on your back to sitting on the side of a flat bed without using bedrails?: A Little Help needed moving to and from a bed to a chair (including a wheelchair)?: A Little Help needed standing up from a chair using your arms (e.g., wheelchair or bedside chair)?: A Little Help needed to walk in hospital room?: A Little Help needed climbing 3-5 steps with a railing? : A Lot 6 Click Score: 17    End of Session Equipment Utilized During Treatment: Oxygen;Back brace Activity Tolerance: Patient limited by pain Patient left: in chair;with call bell/phone within reach;with chair alarm set Nurse Communication: Mobility status PT Visit Diagnosis: Other abnormalities of gait and mobility (R26.89);Pain Pain - part of body:  (back)     Time: 8182-9937 PT Time Calculation (min) (ACUTE ONLY): 23 min  Charges:  $Gait Training: 8-22 mins $Therapeutic Activity: 8-22 mins                     Arlyss Gandy, PT, DPT Acute Rehabilitation Pager: [phone]    Arlyss Gandy 06/30/2020, 8:37 AM

## 2020-06-30 NOTE — Progress Notes (Signed)
  NEUROSURGERY PROGRESS NOTE   No issues overnight.  Complains of appropriate back soreness preoperative lower extremity symptoms resolved work with therapy yesterday.  Recommend home health.  Voiding normal.  Tolerating p.o..  Ready for discharge.  EXAM:  BP (!) 119/53 (BP Location: Right Arm)   Pulse 76   Temp 98.2 F (36.8 C) (Oral)   Resp 14   Ht 5\' 6"  (1.676 m)   Wt 90.7 kg   SpO2 93%   BMI 32.28 kg/m   Awake, alert, oriented  Speech fluent, appropriate  CN grossly intact  5/5 BUE/BLE  Incision: c/d/i  IMPRESSION/PLAN 63 y.o. female POD2 L4-5 fusion. Doing wel - D/C hiwht HH

## 2020-06-30 NOTE — TOC Initial Note (Signed)
Transition of Care [facility]) - Initial/Assessment Note    Patient Details  Name: Brenda Morgan MRN: [id number] Date of Birth: 05-19-1956  [facility]) CM/SW Contact:    Eduard Roux, LCSWA Phone Number: 06/30/2020, 12:22 PM  Clinical Narrative:                  CSW visit with patient at bedside along with her spouse,James. CSW introduced self and explain role. CSW discussed PT updated recommendation of short term rehab before discharging home. Patient states she wants to improve and get stronger before returning home. Patient states " I believe rehab would better right now then going home". CSW explained SNF process. Patient and spouse agreeable to sending referrals to [facility]hab. Patient spouse states she has been there before. [facility] was there next choice. Patient states she has received covid vaccines.   CSW contacted [facility]([place]) # [phone]. CSW faxed clinicals # [phone]. Waiting on response.   Patient spouse states if unable to get placement at [facility] or [place], they will accept [facility] with [facility] in [place]. Fayrene Fearing states they have walkers and non are needed but requested BSC.   Antony Blackbird, MSW, LCSWA Clinical Social Worker   Expected Discharge Plan: Skilled Nursing Facility Barriers to Discharge: SNF Pending bed offer, Insurance Authorization   Patient Goals and CMS Choice        Expected Discharge Plan and Services Expected Discharge Plan: Skilled Nursing Facility In-house Referral: Clinical Social Work     Living arrangements for the past 2 months: Mobile Home Expected Discharge Date: 06/30/20                                    Prior Living Arrangements/Services Living arrangements for the past 2 months: Mobile Home Lives with:: Self, Spouse Patient language and need for interpreter reviewed:: No        Need for Family Participation in Patient Care:  Yes (Comment) Care giver support system in place?: Yes (comment)   Criminal Activity/Legal Involvement Pertinent to Current Situation/Hospitalization: No - Comment as needed  Activities of Daily Living      Permission Sought/Granted Permission sought to share information with : Family Supports Permission granted to share information with : Yes, Verbal Permission Granted  Share Information with NAME: Braniyah Besse  Permission granted to share info w AGENCY: SNFs  Permission granted to share info w Relationship: spouse  Permission granted to share info w Contact Information: [phone]  Emotional Assessment Appearance:: Appears stated age   Affect (typically observed): Accepting, Other (comment) (sleepy) Orientation: : Oriented to Self, Oriented to Place, Oriented to  Time, Oriented to Situation Alcohol / Substance Use: Not Applicable Psych Involvement: No (comment)  Admission diagnosis:  Lumbar spinal stenosis [M48.061] Patient Active Problem List   Diagnosis Date Noted   Lumbar spinal stenosis 06/28/2020   PCP:  Patient, No Pcp Per Pharmacy:   [id number] [place], [place] - [address] [facility] [address] [place] [address] Phone: [phone] Fax: [phone]     Social Determinants of Health (SDOH) Interventions    Readmission Risk Interventions No flowsheet data found.

## 2020-06-30 NOTE — Discharge Summary (Addendum)
Physician Discharge Summary  Patient ID: Brenda Morgan MRN: [id number] DOB/AGE: 08-30-1956 64 y.o.  Admit date: 06/28/2020 Discharge date: 07/01/2020  Admission Diagnoses:  Lumbar spinal stenosis  Discharge Diagnoses:  Same Active Problems:   Lumbar spinal stenosis   Discharged Condition: Stable  Hospital Course:  Brenda Morgan is a 64 y.o. female who was admitted for the below procedure. There were no post operative complications. At time of discharge, pain was well controlled, ambulating with Pt/OT, tolerating po, voiding normal. Ready for discharge.  Treatments: Surgery - L4-5 PLIF  Discharge Exam: Blood pressure (!) 131/57, pulse 80, temperature 99.8 F (37.7 C), temperature source Axillary, resp. rate 16, height 5\' 6"  (1.676 m), weight 90.7 kg, SpO2 93 %. Awake, alert, oriented Speech fluent, appropriate CN grossly intact 5/5 BUE/BLE Wound c/d/i  Disposition: Discharge disposition: 01-Home or Self Care       Discharge Instructions    Call MD for:  difficulty breathing, headache or visual disturbances   Complete by: As directed    Call MD for:  persistant dizziness or light-headedness   Complete by: As directed    Call MD for:  redness, tenderness, or signs of infection (pain, swelling, redness, odor or green/yellow discharge around incision site)   Complete by: As directed    Call MD for:  severe uncontrolled pain   Complete by: As directed    Call MD for:  temperature >100.4   Complete by: As directed    Diet general   Complete by: As directed    Driving Restrictions   Complete by: As directed    Do not drive until given clearance.   Increase activity slowly   Complete by: As directed    Lifting restrictions   Complete by: As directed    Do not lift anything >10lbs. Avoid bending and twisting in awkward positions. Avoid bending at the back.   May shower / Bathe   Complete by: As directed    In 24 hours. Okay to wash wound with warm soapy water.  Avoid scrubbing the wound. Pat dry.   Remove dressing in 24 hours   Complete by: As directed      Allergies as of 07/01/2020      Reactions   Amlodipine Anaphylaxis, Swelling, Other (See Comments)   Tongue swelling    Naltrexone Hives   Penicillins Hives, Other (See Comments)   Tolerated ceftriaxone and cefepime 06/2017 Hives      Medication List    TAKE these medications   atorvastatin 10 MG tablet Commonly known as: LIPITOR Take 5 mg by mouth daily.   baclofen 20 MG tablet Commonly known as: LIORESAL Take 20 mg by mouth 3 (three) times daily.   carvedilol 25 MG tablet Commonly known as: COREG Take 25 mg by mouth daily.   cetirizine 10 MG tablet Commonly known as: ZYRTEC Take 10 mg by mouth daily.   famotidine 40 MG tablet Commonly known as: PEPCID Take 40 mg by mouth daily.   ferrous sulfate 325 (65 FE) MG tablet Take 325 mg by mouth daily with breakfast.   fluticasone 50 MCG/ACT nasal spray Commonly known as: FLONASE Place 1 spray into both nostrils 2 (two) times daily as needed for allergies or rhinitis.   Fluticasone-Salmeterol 250-50 MCG/DOSE Aepb Commonly known as: ADVAIR Inhale 1 puff into the lungs 2 (two) times daily.   insulin aspart protamine- aspart (70-30) 100 UNIT/ML injection Commonly known as: NOVOLOG MIX 70/30 Inject 35-55 Units into the skin 2 (two) times  daily with a meal.   ipratropium-albuterol 0.5-2.5 (3) MG/3ML Soln Commonly known as: DUONEB Take 3 mLs by nebulization every 6 (six) hours as needed (asthma).   lactulose 10 GM/15ML solution Commonly known as: CHRONULAC Take 10 g by mouth 3 (three) times daily.   lamoTRIgine 150 MG tablet Commonly known as: LAMICTAL Take 150 mg by mouth daily.   magnesium oxide 400 MG tablet Commonly known as: MAG-OX Take 400 mg by mouth daily.   methocarbamol 750 MG tablet Commonly known as: Robaxin-750 Take 1 tablet (750 mg total) by mouth 3 (three) times daily as needed for muscle spasms.    metolazone 5 MG tablet Commonly known as: ZAROXOLYN Take 5 mg by mouth 3 (three) times a week.   OMNIVEX PO Take 1 tablet by mouth daily.   oxyCODONE 5 MG immediate release tablet Commonly known as: Oxy IR/ROXICODONE Take 1 tablet (5 mg total) by mouth every 4 (four) hours as needed for severe pain. What changed:   when to take this  reasons to take this   Ozempic (1 MG/DOSE) 2 MG/1.5ML Sopn Generic drug: Semaglutide (1 MG/DOSE) Inject 1 mg into the skin every Sunday.   pantoprazole 40 MG tablet Commonly known as: PROTONIX Take 40 mg by mouth daily.   pioglitazone 15 MG tablet Commonly known as: ACTOS Take 15 mg by mouth daily.   potassium chloride SA 20 MEQ tablet Commonly known as: KLOR-CON Take 20 mEq by mouth daily.   pramipexole 0.5 MG tablet Commonly known as: MIRAPEX Take 1 mg by mouth at bedtime.   pregabalin 200 MG capsule Commonly known as: LYRICA Take 200 mg by mouth in the morning, at noon, and at bedtime.   silver sulfADIAZINE 1 % cream Commonly known as: SILVADENE Apply 1 application topically daily.   spironolactone 50 MG tablet Commonly known as: ALDACTONE Take 50 mg by mouth daily.   Synjardy XR 25-1000 MG Tb24 Generic drug: Empagliflozin-metFORMIN HCl ER Take 1 tablet by mouth daily.   torsemide 20 MG tablet Commonly known as: DEMADEX Take 20 mg by mouth daily.   Vascepa 1 g capsule Generic drug: icosapent Ethyl Take 1 g by mouth 2 (two) times daily.   Vitamin D (Ergocalciferol) 1.25 MG (50000 UNIT) Caps capsule Commonly known as: DRISDOL Take 50,000 Units by mouth every 7 (seven) days.   Vitamin D 50 MCG (2000 UT) tablet Take 2,000 Units by mouth daily.            Durable Medical Equipment  (From admission, onward)         Start     Ordered   06/30/20 1045  DME 3-in-1  Once        07 /08/21 1048          Follow-up Information    07-10-1977, MD. Schedule an appointment as soon as possible for a visit in 3  week(s).   Specialty: Neurosurgery Contact information: [address] [place] [phone]               Signed: [phone] 07/01/2020, 7:16 AM

## 2020-06-30 NOTE — Progress Notes (Signed)
Occupational Therapy Treatment Patient Details Name: Brenda Morgan MRN: [id number] DOB: Jun 27, 1956 Today's Date: 06/30/2020    History of present illness 64 y.o. female with longstanding history of lower back and right greater than left leg pain.  Pain is severe and interferes with ADLs.  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 Pt underwent L4-5 posterior decompression and fusion on 06/28/2020.   OT comments  Pt limited by lethargy and impaired cognition this date.  She requires min - mod A, overall for ADLs and min - mod A for functional transfers.  She requires max cues to recall back precautions.  Do not feel her spouse can safely assist her at home at current level, therefore, disposition recommendation changed to SNF.   Follow Up Recommendations  Home health OT;Supervision/Assistance - 24 hour    Equipment Recommendations  None recommended by OT    Recommendations for Other Services      Precautions / Restrictions Precautions Precautions: Fall;Back Precaution Booklet Issued: No Precaution Comments: pt able to recall 1/3 back precautions and required max cues to recall the remaining precautions  Required Braces or Orthoses: Spinal Brace Spinal Brace: Lumbar corset;Applied in sitting position       Mobility Bed Mobility Overal bed mobility: Needs Assistance Bed Mobility: Rolling;Sidelying to Sit Rolling: Supervision (use of rail) Sidelying to sit: Min guard       General bed mobility comments: pt requires min cues for technique and to initiate activity   Transfers Overall transfer level: Needs assistance Equipment used: Rolling walker (2 wheeled) Transfers: Sit to/from Stand Sit to Stand: Mod assist Stand pivot transfers: Min assist       General transfer comment: mod A to move sit to stand from bed as well as max cues for proper hand placement.  Min A to for balance     Balance Overall balance assessment:  Needs assistance Sitting-balance support: No upper extremity supported;Feet supported Sitting balance-Leahy Scale: Fair     Standing balance support: Bilateral upper extremity supported Standing balance-Leahy Scale: Poor Standing balance comment: reliant on BUE support of RW                           ADL either performed or assessed with clinical judgement   ADL Overall ADL's : Needs assistance/impaired Eating/Feeding: Set up;Sitting                   Lower Body Dressing: Moderate assistance;Sit to/from stand   Toilet Transfer: Moderate assistance;Stand-pivot;BSC;RW Toilet Transfer Details (indicate cue type and reason): Pt required max cues for hand placement and assist to boost into standing  Toileting- Clothing Manipulation and Hygiene: Moderate assistance;Sit to/from stand Toileting - Clothing Manipulation Details (indicate cue type and reason): assist for clothing manipulation      Functional mobility during ADLs: Moderate assistance;Rolling walker       Vision       Perception     Praxis      Cognition Arousal/Alertness: Lethargic Behavior During Therapy: Flat affect   Area of Impairment: Memory;Safety/judgement;Awareness;Problem solving;Following commands;Attention                   Current Attention Level: Sustained Memory: Decreased recall of precautions;Decreased short-term memory Following Commands: Follows one step commands inconsistently;Follows one step commands with increased time Safety/Judgement: Decreased awareness of safety;Decreased awareness of deficits Awareness: Emergent Problem Solving: Slow processing;Difficulty sequencing;Requires verbal cues General Comments: Pt  with decreased arousal often falling asleep mid activity.  She requires mod cues for safety and problem solving          Exercises     Shoulder Instructions       General Comments      Pertinent Vitals/ Pain       Pain Assessment: Faces Faces  Pain Scale: Hurts even more Pain Location: low back Pain Descriptors / Indicators: Grimacing Pain Intervention(s): Monitored during session;Repositioned  Home Living                                          Prior Functioning/Environment              Frequency  Min 2X/week        Progress Toward Goals  OT Goals(current goals can now be found in the care plan section)  Progress towards OT goals: Not progressing toward goals - comment  Acute Rehab OT Goals Patient Stated Goal: to go to rehab and get stronger  Plan Discharge plan needs to be updated    Co-evaluation                 AM-PAC OT "6 Clicks" Daily Activity     Outcome Measure   Help from another person eating meals?: None Help from another person taking care of personal grooming?: A Little Help from another person toileting, which includes using toliet, bedpan, or urinal?: A Lot Help from another person bathing (including washing, rinsing, drying)?: A Lot Help from another person to put on and taking off regular upper body clothing?: A Little Help from another person to put on and taking off regular lower body clothing?: A Lot 6 Click Score: 16    End of Session Equipment Utilized During Treatment: Rolling walker;Back brace;Gait belt  OT Visit Diagnosis: Unsteadiness on feet (R26.81);Other abnormalities of gait and mobility (R26.89);Muscle weakness (generalized) (M62.81);Pain;Other symptoms and signs involving the nervous system (R29.898) Pain - part of body:  (back )   Activity Tolerance Patient limited by lethargy   Patient Left in chair;with call bell/phone within reach;with chair alarm set   Nurse Communication Mobility status        Time: 1601-0932 OT Time Calculation (min): 22 min  Charges: OT General Charges $OT Visit: 1 Visit OT Treatments $Self Care/Home Management : 8-22 mins  Eber Jones OTR/L Acute Rehabilitation Services Pager [phone] Office  [phone]    Jeani Hawking M 06/30/2020, 5:50 PM

## 2020-07-01 MED ORDER — OXYCODONE HCL 5 MG PO TABS: 5.0000 mg | ORAL_TABLET | ORAL | 0 refills | Status: DC | PRN

## 2020-07-01 NOTE — Plan of Care (Signed)
Discharged with home health

## 2020-07-01 NOTE — Progress Notes (Signed)
  NEUROSURGERY PROGRESS NOTE   No issues overnight. Without my knowledge, patient's d/c was cancelled as she feels as though she needs SNF despite her and her husband telling me yesterday am they were ready to go home. I discussed this further with patient and she says "I don't know what I need" Complains of appropriate back soreness No N/T/W  EXAM:  BP (!) 131/57 (BP Location: Right Arm)   Pulse 80   Temp 99.8 F (37.7 C) (Axillary)   Resp 16   Ht 5\' 6"  (1.676 m)   Wt 90.7 kg   SpO2 93%   BMI 32.28 kg/m   Awake, alert, oriented  Speech fluent, appropriate  CN grossly intact  MAEW Blood on bandage, no active bleeding  IMPRESSION/PLAN 64 y.o. female POD 3 L4-5 fusion. Doing well - dispo planning. Patient to talk to husband about home vs SNF. Either way, she is ready to go today when she solidifies plan.

## 2020-07-01 NOTE — TOC Transition Note (Addendum)
Transition of Care (TOC) - CM/SW Discharge Note Donn Pierini RN,BSN Transitions of Care Unit 4NP (non trauma) - RN Case Manager See Treatment Team for direct Phone #   Patient Details  Name: Patina Spanier MRN: [id number] Date of Birth: 1956-07-22  Transition of Care [facility]) CM/SW Contact:  Darrold Span, RN Phone Number: 07/01/2020, 2:23 PM   Clinical Narrative:    Pt stable for transition home today, Work up had been started for SNF placement however- orders placed today for patient to discharge home with [facility] and DME. CSW spoke with spouse who is on his way from [place] to take pt home  And is agreeable to [facility] services.  Per spouse patient has RW needs 3n1- call made to [facility] with [facility] for DME need- 3n1 to be delivered to room prior to discharge.   CM spoke with spouse and pt at bedside- 3n1 has been delivered to room for home- discussed HH need for PT/OT- choice offered- per spouse would like to use [facility] and if unable to accept does not have a preference- address and phone # confirmed- per spouse- PCP is Frankey Shown [facility]   Call made to [facility]- spoke with Cassie- however pt insurance not in network- they are unable to accept [facility] called- they are also [facility] called- spoke with Florentina Addison- they are able to accept- faxed needed demographics, orders, and notes via epic to [phone]- they will do start of as soon as possible by first of next week.   Call made to spouse to update on [facility] arrangements   Final next level of care: Home w Home Health Services Barriers to Discharge: Barriers Resolved   Patient Goals and CMS Choice Patient states their goals for this hospitalization and ongoing recovery are:: going home CMS Medicare.gov Compare Post Acute Care list provided to:: Patient Choice offered to / list presented to : Spouse  Discharge Placement               Home with [facility]        Discharge  Plan and Services In-house Referral: Clinical Social Work Discharge Planning Services: CM Consult Post Acute Care Choice: Home Health          DME Arranged: 3-N-1 DME Agency: [facility] Date DME Agency Contacted: 07/01/20 Time DME Agency Contacted: 1000 Representative spoke with at DME Agency: Ian Malkin HH Arranged: PT, OT HH Agency: [facility] Date HH Agency Contacted: 07/01/20 Time HH Agency Contacted: [phone] Representative spoke with at [facility] Agency: Katie  Social Determinants of Health (SDOH) Interventions     Readmission Risk Interventions No flowsheet data found.

## 2020-07-01 NOTE — Progress Notes (Signed)
Patient doing well and is discharged home with home health.

## 2020-07-01 NOTE — Progress Notes (Signed)
Physical Therapy Treatment Patient Details Name: Brenda Morgan MRN: [id number] DOB: 1956-04-27 Today's Date: 07/01/2020    History of Present Illness 64 y.o. female with longstanding history of lower back and right greater than left leg pain.  Pain is severe and interferes with ADLs.  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 Pt underwent L4-5 posterior decompression and fusion on 06/28/2020.    PT Comments    Pt tolerated treatment well with improved activity tolerance and ambulation distance this session. Pt does continue to remain intermittently lethargic during session, but the pt now reports this as her baseline. While pt continues to demonstrate deficits in back precautions recall her spouse is able to recall all back precautions at this time. Pt does report continues weakness and quick fatigue of LEs at this time and will continue to benefit from acute PT POC. Pt reports she is discharging home today, PT updating recommendations to HHPT and pt and spouse are requesting a bedside commode. Pt will benefit from a bedside commode to limit mobility requirements to the bathroom if family are unable to assist, or in instances of increased urgency.   Follow Up Recommendations  Home health PT;Supervision for mobility/OOB     Equipment Recommendations  3in1 (PT)    Recommendations for Other Services       Precautions / Restrictions Precautions Precautions: Fall;Back Precaution Booklet Issued: No Precaution Comments: pt unable to recall back precautions, pt's spouse is able to name all back precautions, pt is able to don brace with cues for lower positioning. PT reinforces use of brace during all OOB activity including sitting and to not wear the brace when in bed Required Braces or Orthoses: Spinal Brace Spinal Brace: Lumbar corset;Applied in sitting position Restrictions Weight Bearing Restrictions: No    Mobility  Bed Mobility                General bed mobility comments: pt received and left in recliner  Transfers Overall transfer level: Needs assistance Equipment used:  (upright walker) Transfers: Sit to/from Stand Sit to Stand: Min guard            Ambulation/Gait Ambulation/Gait assistance: Supervision Gait Distance (Feet): 80 Feet Assistive device:  (upright 4 wheeled walker) Gait Pattern/deviations: Step-to pattern Gait velocity: reduced Gait velocity interpretation: <1.8 ft/sec, indicate of risk for recurrent falls General Gait Details: pt with short step to gait, reduced gait speed, no significant balance deviations. Pt does appear to be utilizing significant support of UEs   Stairs             Wheelchair Mobility    Modified Rankin (Stroke Patients Only)       Balance Overall balance assessment: Needs assistance Sitting-balance support: No upper extremity supported;Feet supported Sitting balance-Leahy Scale: Fair     Standing balance support: Bilateral upper extremity supported Standing balance-Leahy Scale: Poor Standing balance comment: reliant on UE support                            Cognition Arousal/Alertness: Awake/alert;Lethargic (intermittently lethargic, pt reports this is normal?) Behavior During Therapy: Flat affect Overall Cognitive Status: Impaired/Different from baseline Area of Impairment: Attention;Memory;Following commands;Safety/judgement;Awareness;Problem solving                   Current Attention Level: Sustained Memory: Decreased recall of precautions;Decreased short-term memory Following Commands: Follows one step commands consistently   Awareness: Emergent Problem Solving:  Slow processing        Exercises      General Comments General comments (skin integrity, edema, etc.): pt on RA, VSS      Pertinent Vitals/Pain Pain Assessment: Faces Faces Pain Scale: Hurts even more Pain Location: low back Pain Descriptors /  Indicators: Grimacing Pain Intervention(s): Monitored during session    Home Living                      Prior Function            PT Goals (current goals can now be found in the care plan section) Acute Rehab PT Goals Patient Stated Goal: tp get stronger Progress towards PT goals: Progressing toward goals    Frequency    Min 5X/week      PT Plan Discharge plan needs to be updated    Co-evaluation              AM-PAC PT "6 Clicks" Mobility   Outcome Measure  Help needed turning from your back to your side while in a flat bed without using bedrails?: A Little Help needed moving from lying on your back to sitting on the side of a flat bed without using bedrails?: A Little Help needed moving to and from a bed to a chair (including a wheelchair)?: A Little Help needed standing up from a chair using your arms (e.g., wheelchair or bedside chair)?: A Little Help needed to walk in hospital room?: None Help needed climbing 3-5 steps with a railing? : A Lot 6 Click Score: 18    End of Session Equipment Utilized During Treatment: Back brace Activity Tolerance: Patient tolerated treatment well Patient left: in chair;with call bell/phone within reach;with family/visitor present Nurse Communication: Mobility status PT Visit Diagnosis: Other abnormalities of gait and mobility (R26.89);Pain Pain - part of body:  (back)     Time: 6644-0347 PT Time Calculation (min) (ACUTE ONLY): 20 min  Charges:  $Gait Training: 8-22 mins                     Arlyss Gandy, PT, DPT Acute Rehabilitation Pager: [phone]    Arlyss Gandy 07/01/2020, 11:13 AM

## 2020-07-04 ENCOUNTER — Encounter (HOSPITAL_COMMUNITY): Payer: Self-pay

## 2020-07-04 ENCOUNTER — Other Ambulatory Visit: Payer: Self-pay

## 2020-07-04 ENCOUNTER — Emergency Department (HOSPITAL_COMMUNITY)
Admission: EM | Admit: 2020-07-04 | Discharge: 2020-07-05 | Disposition: A | Discharge status: Home / Self Care | Payer: BC Managed Care – PPO | Source: Home / Self Care

## 2020-07-04 DIAGNOSIS — M5416 Radiculopathy, lumbar region: Secondary | ICD-10-CM | POA: Diagnosis not present

## 2020-07-04 DIAGNOSIS — Z5321 Procedure and treatment not carried out due to patient leaving prior to being seen by health care provider: Secondary | ICD-10-CM | POA: Insufficient documentation

## 2020-07-04 DIAGNOSIS — M25551 Pain in right hip: Secondary | ICD-10-CM | POA: Insufficient documentation

## 2020-07-04 DIAGNOSIS — M545 Low back pain: Secondary | ICD-10-CM | POA: Diagnosis not present

## 2020-07-04 LAB — CBC
HCT: 36.4 % (ref 36.0–46.0)
Hemoglobin: 11.5 g/dL — ABNORMAL LOW (ref 12.0–15.0)
MCH: 29.1 pg (ref 26.0–34.0)
MCHC: 31.6 g/dL (ref 30.0–36.0)
MCV: 92.2 fL (ref 80.0–100.0)
Platelets: 289 10*3/uL (ref 150–400)
RBC: 3.95 MIL/uL (ref 3.87–5.11)
RDW: 14.6 % (ref 11.5–15.5)
WBC: 11.2 10*3/uL — ABNORMAL HIGH (ref 4.0–10.5)
nRBC: 0 % (ref 0.0–0.2)

## 2020-07-04 LAB — BASIC METABOLIC PANEL
Anion gap: 13 (ref 5–15)
BUN: 33 mg/dL — ABNORMAL HIGH (ref 8–23)
CO2: 32 mmol/L (ref 22–32)
Calcium: 9 mg/dL (ref 8.9–10.3)
Chloride: 91 mmol/L — ABNORMAL LOW (ref 98–111)
Creatinine, Ser: 1.3 mg/dL — ABNORMAL HIGH (ref 0.44–1.00)
GFR calc Af Amer: 51 mL/min — ABNORMAL LOW (ref >60)
GFR calc non Af Amer: 44 mL/min — ABNORMAL LOW (ref >60)
Glucose, Bld: 163 mg/dL — ABNORMAL HIGH (ref 70–99)
Potassium: 3.4 mmol/L — ABNORMAL LOW (ref 3.5–5.1)
Sodium: 136 mmol/L (ref 135–145)

## 2020-07-04 LAB — URINALYSIS, ROUTINE W REFLEX MICROSCOPIC
Bacteria, UA: NONE SEEN
Bilirubin Urine: NEGATIVE
Glucose, UA: >=500 mg/dL — AB
Hgb urine dipstick: NEGATIVE
Ketones, ur: NEGATIVE mg/dL
Leukocytes,Ua: NEGATIVE
Nitrite: NEGATIVE
Protein, ur: NEGATIVE mg/dL
Specific Gravity, Urine: 1.016 (ref 1.005–1.030)
pH: 7 (ref 5.0–8.0)

## 2020-07-04 NOTE — ED Triage Notes (Signed)
Pt reports right hip pain that radiates down her leg. Pt had back surgery 7/2, pain was there before the surgery but it has now gotten worse, pt taking oxycodone without relief of this pain. Pt ambulatory with walker.

## 2020-07-05 NOTE — ED Notes (Signed)
Patient states her husband is ouside and she cant sit around any longer. Patient leaving

## 2020-07-05 NOTE — Op Note (Signed)
NEUROSURGERY OPERATIVE NOTE   PREOP DIAGNOSIS:  1. Lumbar stenosis without neurogenic claudication 2. Spondylolisthesis, L4-5  POSTOP DIAGNOSIS: Same  PROCEDURE: 1. L4 laminectomy with facetectomy for decompression of exiting nerve roots, more than would be required for placement of interbody graft 2. Placement of anterior interbody device - [employer] expandable cage, 80mm x 80mm x 2 3. Posterior non-segmental instrumentation using cortical pedicle screws at L4 - L5 4. Interbody arthrodesis, L4-5 5. Use of locally harvested bone autograft 6. Use of non-structural bone allograft - BMP  SURGEON: Dr. Lisbeth Renshaw, MD  ASSISTANT: Cindra Presume, PA-C  ANESTHESIA: General Endotracheal  EBL: 150cc  SPECIMENS: None  DRAINS: None  COMPLICATIONS: None immediate  CONDITION: Hemodynamically stable to PACU  HISTORY: Brenda Morgan is a 64 y.o. female who has been followed in the outpatient clinic with back and leg pain related to spondylosis at L4-5. multiple conservative treatments were attempted without significant improvement and we ultimately elected to proceed with surgical decompression and fusion. Risks, benefits, and alternative treatments were reviewed in detail in the office. After all questions were answered, informed consent was obtained and witnessed.  PROCEDURE IN DETAIL: The patient was brought to the operating room via stretcher. After induction of general anesthesia, the patient was positioned on the operative table in the prone position. All pressure points were meticulously padded. Incision was then marked out and prepped and draped in the usual sterile fashion.  After timeout was conducted, skin was infiltrated with local anesthetic. Skin incision was then made sharply and Bovie electrocautery was used to dissect the subcutaneous tissue until the lumbodorsal fascia was identified and incised. The muscle was then elevated in the subperiosteal plane and the L4  lamina and L4-5 facet complexes were identified. Self-retaining retractors were then placed. Lateral fluoroscopy was taken with a dissector in the L4-5 interspace to confirm our location.  At this point attention was turned to decompression. Complete L4 laminectomy was completed with a high-speed drill and Kerrison punches.  Normal dura was identified.  A ball-tipped dissector was then used to identify the foramina bilaterally.  High-speed drill was used to cut across the pars interarticularis and the inferior articulating process of L4 was removed bilaterally.  The exiting nerve roots and the traversing nerve roots were then identified.  I was then able to easily pass a ball dissector in the ventral epidural space and underneath the bilateral L4 and L5 nerves indicating good decompression.   Disc space was then identified, incised bilaterally, and using a combination of shavers, curettes and rongeurs, complete discectomy was completed. Endplates were prepared with curettes and rasps, and bone harvested during decompression was mixed with BMP and packed into the interspace. A 68mm expandable cage was tapped into place bilaterally. Cages were expanded to achieve good endplate apposition and maintain good lumbar lordosis.  Good position was confirmed with fluoroscopy.  At this point, the entry points for bilateral L4 and L5 cortical pedicle screws were identified using standard anatomic landmarks and lateral fluoro. Pilot holes were then drilled and tapped to 5.5 x 79mm. Screws were then placed in L4 and L5 bilaterally. Fluoroscopy demonstrated good position of the hardware. The right L4 pedicle screw appeared to course along the medial aspect of the pedicle, however direct visualization of the thecal sac and medial pedicle did not reveal any breach. Prebent lordotic rod was then sized and placed into the pedicle screws. Set screws were placed and final tightened. Final AP and lateral fluoroscopic images  confirmed good position.  Hemostasis was secured and confirmed with bipolar cautery and morcellized gelfoam with thrombin. The wound was then irrigated with copious amounts of antibiotic saline, then closed in standard fashion using a combination of interrupted 0 and 3-0 Vicryl stitches in the muscular, fascial, and subcutaneous layers. Skin was then closed using standard Dermabond. Sterile dressing was then applied. The patient was then transferred to the stretcher, extubated, and taken to the postanesthesia care unit in stable hemodynamic condition.  At the end of the case all sponge, needle, cottonoid, and instrument counts were correct.

## 2020-07-06 ENCOUNTER — Inpatient Hospital Stay (HOSPITAL_COMMUNITY)
Admission: EM | Admit: 2020-07-06 | Discharge: 2020-07-13 | DRG: 552 | Disposition: A | Discharge status: Home Health | Payer: BC Managed Care – PPO | Attending: Neurosurgery | Admitting: Neurosurgery

## 2020-07-06 ENCOUNTER — Other Ambulatory Visit: Payer: Self-pay

## 2020-07-06 ENCOUNTER — Inpatient Hospital Stay: Admission: AD | Admit: 2020-07-06 | Payer: Medicare FFS | Source: Ambulatory Visit | Admitting: Neurosurgery

## 2020-07-06 ENCOUNTER — Encounter (HOSPITAL_COMMUNITY): Payer: Self-pay

## 2020-07-06 DIAGNOSIS — M549 Dorsalgia, unspecified: Secondary | ICD-10-CM

## 2020-07-06 DIAGNOSIS — R2689 Other abnormalities of gait and mobility: Secondary | ICD-10-CM | POA: Diagnosis present

## 2020-07-06 DIAGNOSIS — Z880 Allergy status to penicillin: Secondary | ICD-10-CM

## 2020-07-06 DIAGNOSIS — M545 Low back pain: Secondary | ICD-10-CM | POA: Diagnosis present

## 2020-07-06 DIAGNOSIS — F17210 Nicotine dependence, cigarettes, uncomplicated: Secondary | ICD-10-CM | POA: Diagnosis present

## 2020-07-06 DIAGNOSIS — Z79899 Other long term (current) drug therapy: Secondary | ICD-10-CM

## 2020-07-06 DIAGNOSIS — G4730 Sleep apnea, unspecified: Secondary | ICD-10-CM | POA: Diagnosis present

## 2020-07-06 DIAGNOSIS — E119 Type 2 diabetes mellitus without complications: Secondary | ICD-10-CM | POA: Diagnosis present

## 2020-07-06 DIAGNOSIS — M5416 Radiculopathy, lumbar region: Secondary | ICD-10-CM

## 2020-07-06 DIAGNOSIS — Z7951 Long term (current) use of inhaled steroids: Secondary | ICD-10-CM

## 2020-07-06 DIAGNOSIS — Z794 Long term (current) use of insulin: Secondary | ICD-10-CM | POA: Diagnosis not present

## 2020-07-06 DIAGNOSIS — K760 Fatty (change of) liver, not elsewhere classified: Secondary | ICD-10-CM | POA: Diagnosis present

## 2020-07-06 DIAGNOSIS — Z888 Allergy status to other drugs, medicaments and biological substances status: Secondary | ICD-10-CM

## 2020-07-06 DIAGNOSIS — J449 Chronic obstructive pulmonary disease, unspecified: Secondary | ICD-10-CM | POA: Diagnosis present

## 2020-07-06 DIAGNOSIS — G8918 Other acute postprocedural pain: Secondary | ICD-10-CM | POA: Diagnosis present

## 2020-07-06 DIAGNOSIS — N179 Acute kidney failure, unspecified: Secondary | ICD-10-CM | POA: Diagnosis present

## 2020-07-06 DIAGNOSIS — R202 Paresthesia of skin: Secondary | ICD-10-CM

## 2020-07-06 DIAGNOSIS — Z20822 Contact with and (suspected) exposure to covid-19: Secondary | ICD-10-CM | POA: Diagnosis present

## 2020-07-06 HISTORY — DX: Radiculopathy, lumbar region: M54.16

## 2020-07-06 LAB — CBG MONITORING, ED: Glucose-Capillary: 183 mg/dL — ABNORMAL HIGH (ref 70–99)

## 2020-07-06 LAB — SARS CORONAVIRUS 2 BY RT PCR (HOSPITAL ORDER, PERFORMED IN ~~LOC~~ HOSPITAL LAB): SARS Coronavirus 2: NEGATIVE

## 2020-07-06 MED ORDER — POLYETHYLENE GLYCOL 3350 17 G PO PACK: 17.0000 g | PACK | Freq: Every day | ORAL | Status: DC | PRN

## 2020-07-06 MED ORDER — OXYCODONE HCL 5 MG PO TABS
5.0000 mg | ORAL_TABLET | ORAL | Status: DC | PRN
  Administered 2020-07-07 – 2020-07-13 (×21): 5 mg via ORAL
  Filled 2020-07-06 (×21): qty 1

## 2020-07-06 MED ORDER — DEXAMETHASONE 4 MG PO TABS
2.0000 mg | ORAL_TABLET | Freq: Four times a day (QID) | ORAL | Status: DC
  Administered 2020-07-07 (×2): 2 mg via ORAL
  Filled 2020-07-06 (×2): qty 1

## 2020-07-06 MED ORDER — SODIUM CHLORIDE 0.9 % IV SOLN
250.0000 mL | INTRAVENOUS | Status: DC
  Administered 2020-07-06: 250 mL via INTRAVENOUS

## 2020-07-06 MED ORDER — TORSEMIDE 20 MG PO TABS
20.0000 mg | ORAL_TABLET | Freq: Every day | ORAL | Status: DC
  Administered 2020-07-07: 20 mg via ORAL
  Filled 2020-07-06: qty 1

## 2020-07-06 MED ORDER — IPRATROPIUM-ALBUTEROL 0.5-2.5 (3) MG/3ML IN SOLN: 3.0000 mL | Freq: Four times a day (QID) | RESPIRATORY_TRACT | Status: DC | PRN

## 2020-07-06 MED ORDER — LAMOTRIGINE 100 MG PO TABS
150.0000 mg | ORAL_TABLET | Freq: Every day | ORAL | Status: DC
  Administered 2020-07-07 – 2020-07-13 (×7): 150 mg via ORAL
  Filled 2020-07-06 (×8): qty 2

## 2020-07-06 MED ORDER — LORATADINE 10 MG PO TABS
10.0000 mg | ORAL_TABLET | Freq: Every day | ORAL | Status: DC
  Administered 2020-07-07 – 2020-07-13 (×7): 10 mg via ORAL
  Filled 2020-07-06 (×7): qty 1

## 2020-07-06 MED ORDER — PHENOL 1.4 % MT LIQD
1.0000 | OROMUCOSAL | Status: DC | PRN
  Filled 2020-07-06: qty 177

## 2020-07-06 MED ORDER — INSULIN ASPART PROT & ASPART (70-30 MIX) 100 UNIT/ML ~~LOC~~ SUSP
35.0000 [IU] | Freq: Two times a day (BID) | SUBCUTANEOUS | Status: DC
  Administered 2020-07-07 – 2020-07-13 (×13): 35 [IU] via SUBCUTANEOUS
  Filled 2020-07-06: qty 10

## 2020-07-06 MED ORDER — INSULIN ASPART 100 UNIT/ML ~~LOC~~ SOLN
0.0000 [IU] | Freq: Every day | SUBCUTANEOUS | Status: DC
  Administered 2020-07-07: 3 [IU] via SUBCUTANEOUS
  Administered 2020-07-10: 2 [IU] via SUBCUTANEOUS

## 2020-07-06 MED ORDER — ONDANSETRON HCL 4 MG/2ML IJ SOLN: 4.0000 mg | Freq: Four times a day (QID) | INTRAMUSCULAR | Status: DC | PRN

## 2020-07-06 MED ORDER — VITAMIN D (ERGOCALCIFEROL) 1.25 MG (50000 UNIT) PO CAPS: 50000.0000 [IU] | ORAL_CAPSULE | ORAL | Status: DC

## 2020-07-06 MED ORDER — MORPHINE SULFATE (PF) 2 MG/ML IV SOLN
2.0000 mg | INTRAVENOUS | Status: DC | PRN
  Administered 2020-07-06 – 2020-07-07 (×2): 2 mg via INTRAVENOUS
  Filled 2020-07-06 (×2): qty 1

## 2020-07-06 MED ORDER — CARVEDILOL 25 MG PO TABS
25.0000 mg | ORAL_TABLET | Freq: Every day | ORAL | Status: DC
  Administered 2020-07-07 – 2020-07-13 (×7): 25 mg via ORAL
  Filled 2020-07-06 (×7): qty 1

## 2020-07-06 MED ORDER — DOCUSATE SODIUM 100 MG PO CAPS
100.0000 mg | ORAL_CAPSULE | Freq: Two times a day (BID) | ORAL | Status: DC
  Administered 2020-07-06 – 2020-07-13 (×14): 100 mg via ORAL
  Filled 2020-07-06 (×15): qty 1

## 2020-07-06 MED ORDER — INSULIN ASPART 100 UNIT/ML ~~LOC~~ SOLN
0.0000 [IU] | Freq: Three times a day (TID) | SUBCUTANEOUS | Status: DC
  Administered 2020-07-07: 11 [IU] via SUBCUTANEOUS
  Administered 2020-07-07: 5 [IU] via SUBCUTANEOUS
  Administered 2020-07-07: 3 [IU] via SUBCUTANEOUS
  Administered 2020-07-08: 5 [IU] via SUBCUTANEOUS
  Administered 2020-07-08: 3 [IU] via SUBCUTANEOUS
  Administered 2020-07-09 (×2): 8 [IU] via SUBCUTANEOUS
  Administered 2020-07-09: 2 [IU] via SUBCUTANEOUS
  Administered 2020-07-10 (×2): 5 [IU] via SUBCUTANEOUS
  Administered 2020-07-11: 3 [IU] via SUBCUTANEOUS
  Administered 2020-07-11 (×2): 5 [IU] via SUBCUTANEOUS
  Administered 2020-07-12: 2 [IU] via SUBCUTANEOUS
  Administered 2020-07-12: 3 [IU] via SUBCUTANEOUS
  Administered 2020-07-12: 5 [IU] via SUBCUTANEOUS
  Administered 2020-07-13: 3 [IU] via SUBCUTANEOUS

## 2020-07-06 MED ORDER — LACTULOSE 10 GM/15ML PO SOLN
10.0000 g | Freq: Three times a day (TID) | ORAL | Status: DC
  Administered 2020-07-06 – 2020-07-12 (×16): 10 g via ORAL
  Filled 2020-07-06 (×9): qty 15
  Filled 2020-07-06: qty 30
  Filled 2020-07-06 (×9): qty 15

## 2020-07-06 MED ORDER — FAMOTIDINE 20 MG PO TABS
40.0000 mg | ORAL_TABLET | Freq: Every day | ORAL | Status: DC
  Administered 2020-07-07 – 2020-07-13 (×7): 40 mg via ORAL
  Filled 2020-07-06 (×7): qty 2

## 2020-07-06 MED ORDER — MOMETASONE FURO-FORMOTEROL FUM 200-5 MCG/ACT IN AERO
2.0000 | INHALATION_SPRAY | Freq: Two times a day (BID) | RESPIRATORY_TRACT | Status: DC
  Administered 2020-07-07 – 2020-07-12 (×12): 2 via RESPIRATORY_TRACT
  Filled 2020-07-06: qty 8.8

## 2020-07-06 MED ORDER — MENTHOL 3 MG MT LOZG
1.0000 | LOZENGE | OROMUCOSAL | Status: DC | PRN
  Filled 2020-07-06: qty 9

## 2020-07-06 MED ORDER — PIOGLITAZONE HCL 15 MG PO TABS
15.0000 mg | ORAL_TABLET | Freq: Every day | ORAL | Status: DC
  Administered 2020-07-07 – 2020-07-13 (×7): 15 mg via ORAL
  Filled 2020-07-06 (×7): qty 1

## 2020-07-06 MED ORDER — MAGNESIUM OXIDE 400 (241.3 MG) MG PO TABS
400.0000 mg | ORAL_TABLET | Freq: Every day | ORAL | Status: DC
  Administered 2020-07-07 – 2020-07-13 (×7): 400 mg via ORAL
  Filled 2020-07-06 (×7): qty 1

## 2020-07-06 MED ORDER — METOLAZONE 5 MG PO TABS
5.0000 mg | ORAL_TABLET | ORAL | Status: DC
  Administered 2020-07-08 – 2020-07-13 (×3): 5 mg via ORAL
  Filled 2020-07-06 (×3): qty 1

## 2020-07-06 MED ORDER — ACETAMINOPHEN 650 MG RE SUPP: 650.0000 mg | RECTAL | Status: DC | PRN

## 2020-07-06 MED ORDER — FERROUS SULFATE 325 (65 FE) MG PO TABS
325.0000 mg | ORAL_TABLET | Freq: Every day | ORAL | Status: DC
  Administered 2020-07-07 – 2020-07-13 (×7): 325 mg via ORAL
  Filled 2020-07-06 (×7): qty 1

## 2020-07-06 MED ORDER — SODIUM CHLORIDE 0.9% FLUSH
3.0000 mL | Freq: Two times a day (BID) | INTRAVENOUS | Status: DC
  Administered 2020-07-07 – 2020-07-12 (×11): 3 mL via INTRAVENOUS

## 2020-07-06 MED ORDER — PANTOPRAZOLE SODIUM 40 MG PO TBEC
40.0000 mg | DELAYED_RELEASE_TABLET | Freq: Every day | ORAL | Status: DC
  Administered 2020-07-07 – 2020-07-12 (×6): 40 mg via ORAL
  Filled 2020-07-06 (×7): qty 1

## 2020-07-06 MED ORDER — POTASSIUM CHLORIDE CRYS ER 20 MEQ PO TBCR
20.0000 meq | EXTENDED_RELEASE_TABLET | Freq: Every day | ORAL | Status: DC
  Administered 2020-07-07 – 2020-07-13 (×7): 20 meq via ORAL
  Filled 2020-07-06 (×7): qty 1

## 2020-07-06 MED ORDER — SODIUM CHLORIDE 0.9% FLUSH: 3.0000 mL | INTRAVENOUS | Status: DC | PRN

## 2020-07-06 MED ORDER — MOMETASONE FURO-FORMOTEROL FUM 200-5 MCG/ACT IN AERO: 2.0000 | INHALATION_SPRAY | Freq: Two times a day (BID) | RESPIRATORY_TRACT | Status: DC

## 2020-07-06 MED ORDER — EMPAGLIFLOZIN-METFORMIN HCL ER 25-1000 MG PO TB24: 1.0000 | ORAL_TABLET | Freq: Every day | ORAL | Status: DC

## 2020-07-06 MED ORDER — PREGABALIN 100 MG PO CAPS
200.0000 mg | ORAL_CAPSULE | Freq: Three times a day (TID) | ORAL | Status: DC
  Administered 2020-07-06 – 2020-07-13 (×20): 200 mg via ORAL
  Filled 2020-07-06 (×20): qty 2

## 2020-07-06 MED ORDER — BACLOFEN 10 MG PO TABS
20.0000 mg | ORAL_TABLET | Freq: Three times a day (TID) | ORAL | Status: DC
  Administered 2020-07-07: 20 mg via ORAL
  Filled 2020-07-06 (×2): qty 2

## 2020-07-06 MED ORDER — SPIRONOLACTONE 25 MG PO TABS
50.0000 mg | ORAL_TABLET | Freq: Every day | ORAL | Status: DC
  Administered 2020-07-07: 50 mg via ORAL
  Filled 2020-07-06: qty 2

## 2020-07-06 MED ORDER — PRAMIPEXOLE DIHYDROCHLORIDE 1 MG PO TABS
1.0000 mg | ORAL_TABLET | Freq: Every day | ORAL | Status: DC
  Administered 2020-07-07 – 2020-07-12 (×7): 1 mg via ORAL
  Filled 2020-07-06 (×7): qty 1

## 2020-07-06 MED ORDER — VITAMIN D 25 MCG (1000 UNIT) PO TABS
2000.0000 [IU] | ORAL_TABLET | Freq: Every day | ORAL | Status: DC
  Administered 2020-07-07 – 2020-07-13 (×7): 2000 [IU] via ORAL
  Filled 2020-07-06 (×7): qty 2

## 2020-07-06 MED ORDER — ONDANSETRON HCL 4 MG PO TABS: 4.0000 mg | ORAL_TABLET | Freq: Four times a day (QID) | ORAL | Status: DC | PRN

## 2020-07-06 MED ORDER — FLUTICASONE PROPIONATE 50 MCG/ACT NA SUSP
1.0000 | Freq: Two times a day (BID) | NASAL | Status: DC | PRN
  Filled 2020-07-06: qty 16

## 2020-07-06 MED ORDER — SEMAGLUTIDE (1 MG/DOSE) 2 MG/1.5ML ~~LOC~~ SOPN: 1.0000 mg | PEN_INJECTOR | SUBCUTANEOUS | Status: DC

## 2020-07-06 MED ORDER — ICOSAPENT ETHYL 1 G PO CAPS
1.0000 g | ORAL_CAPSULE | Freq: Two times a day (BID) | ORAL | Status: DC
  Administered 2020-07-06 – 2020-07-13 (×14): 1 g via ORAL
  Filled 2020-07-06 (×15): qty 1

## 2020-07-06 MED ORDER — FLEET ENEMA 7-19 GM/118ML RE ENEM: 1.0000 | ENEMA | Freq: Once | RECTAL | Status: DC | PRN

## 2020-07-06 MED ORDER — ENOXAPARIN SODIUM 40 MG/0.4ML ~~LOC~~ SOLN
40.0000 mg | SUBCUTANEOUS | Status: DC
  Administered 2020-07-07 – 2020-07-12 (×6): 40 mg via SUBCUTANEOUS
  Filled 2020-07-06 (×7): qty 0.4

## 2020-07-06 MED ORDER — METHOCARBAMOL 750 MG PO TABS
750.0000 mg | ORAL_TABLET | Freq: Three times a day (TID) | ORAL | Status: DC | PRN
  Administered 2020-07-07 – 2020-07-12 (×4): 750 mg via ORAL
  Filled 2020-07-06 (×4): qty 1

## 2020-07-06 MED ORDER — ACETAMINOPHEN 325 MG PO TABS: 650.0000 mg | ORAL_TABLET | ORAL | Status: DC | PRN

## 2020-07-06 MED ORDER — ATORVASTATIN CALCIUM 10 MG PO TABS
5.0000 mg | ORAL_TABLET | Freq: Every day | ORAL | Status: DC
  Administered 2020-07-07 – 2020-07-13 (×7): 5 mg via ORAL
  Filled 2020-07-06 (×7): qty 1

## 2020-07-06 NOTE — ED Notes (Signed)
[facility] Hospital bed ordered for pt

## 2020-07-06 NOTE — ED Notes (Signed)
Pt states she does not need to be in the ER waiting room she was told to come to the hospital for admission. This RN and registration do see in the chart she has a pending admission. Pt request to be taken to admission desk on 2nd floor. Pt was escorted in wheelchair by a patient advocate and helped to check in at the admission desk.

## 2020-07-06 NOTE — ED Notes (Signed)
Pt arrives back to ER waiting room- was told by admissions she does not have a ready room and will need to start in ER.

## 2020-07-06 NOTE — ED Provider Notes (Signed)
[facility] EMERGENCY DEPARTMENT Provider Note   CSN: [id number] Arrival date & time: 07/06/20  1526     History Chief Complaint  Patient presents with  . Back Pain    Brenda Morgan is a 64 y.o. female.  HPI Patient here with concern for back pain.  She had lumbar surgery, 06/28/2020: L4 laminectomy with facetectomy for decompression of exiting nerve roots.  She had placement of hardware, and arthrodesis.  Apparently also had bone grafting.  Patient states she followed-up with her neurosurgeon today in the office, and because of pain in her low back and numbness in her right leg, he "sent me here to be admitted."  The patient came by private vehicle, went to the admissions office but there was no bed ready for her.  She was therefore sent to the ED for disposition.  Patient denies fever, chills, vomiting or dizziness.  She is taking her usual prescribed medication without relief.  There are no other known modifying factors.    Past Medical History:  Diagnosis Date  . COPD (chronic obstructive pulmonary disease) (HCC)   . Diabetes mellitus without complication (HCC)   . Fatty liver 2012   per pt 06/24/20  . Sleep apnea     Patient Active Problem List   Diagnosis Date Noted  . Postoperative back pain 07/06/2020  . Lumbar spinal stenosis 06/28/2020    Past Surgical History:  Procedure Laterality Date  . carotid artery surgery Right 10/07/2012  . carotid ultrasound  06/2015  . CHOLECYSTECTOMY  1981  . COLONOSCOPY  2014  . DIAGNOSTIC MAMMOGRAM  03/2017  . groin lypnoid Left 03/2015  . right heel spur  1994  . TUBAL LIGATION  1985  . wrist trigger release Right 01/2016     OB History   No obstetric history on file.     No family history on file.  Social History   Tobacco Use  . Smoking status: Current Every Day Smoker    Packs/day: 0.50    Types: Cigarettes  . Smokeless tobacco: Never Used  Substance Use Topics  . Alcohol use: Not Currently    . Drug use: Not Currently    Home Medications Prior to Admission medications   Medication Sig Start Date End Date Taking? Authorizing Provider  atorvastatin (LIPITOR) 10 MG tablet Take 5 mg by mouth daily.    [provider]  baclofen (LIORESAL) 20 MG tablet Take 20 mg by mouth 3 (three) times daily.    [provider]  carvedilol (COREG) 25 MG tablet Take 25 mg by mouth daily.    [provider]  cetirizine (ZYRTEC) 10 MG tablet Take 10 mg by mouth daily.    [provider]  Cholecalciferol (VITAMIN D) 50 MCG (2000 UT) tablet Take 2,000 Units by mouth daily.    [provider]  Dietary Management Product (OMNIVEX PO) Take 1 tablet by mouth daily.    [provider]  Empagliflozin-metFORMIN HCl ER (SYNJARDY XR) 25-1000 MG TB24 Take 1 tablet by mouth daily.    [provider]  famotidine (PEPCID) 40 MG tablet Take 40 mg by mouth daily.    [provider]  ferrous sulfate 325 (65 FE) MG tablet Take 325 mg by mouth daily with breakfast.    [provider]  fluticasone (FLONASE) 50 MCG/ACT nasal spray Place 1 spray into both nostrils 2 (two) times daily as needed for allergies or rhinitis.    [provider]  Fluticasone-Salmeterol (ADVAIR)  250-50 MCG/DOSE AEPB Inhale 1 puff into the lungs 2 (two) times daily.    [provider]  icosapent Ethyl (VASCEPA) 1 g capsule Take 1 g by mouth 2 (two) times daily.    [provider]  insulin aspart protamine- aspart (NOVOLOG MIX 70/30) (70-30) 100 UNIT/ML injection Inject 35-55 Units into the skin 2 (two) times daily with a meal.    [provider]  ipratropium-albuterol (DUONEB) 0.5-2.5 (3) MG/3ML SOLN Take 3 mLs by nebulization every 6 (six) hours as needed (asthma).    [provider]  lactulose (CHRONULAC) 10 GM/15ML solution Take 10 g by mouth 3 (three) times daily.    [provider]  lamoTRIgine (LAMICTAL) 150 MG  tablet Take 150 mg by mouth daily.    [provider]  magnesium oxide (MAG-OX) 400 MG tablet Take 400 mg by mouth daily.    [provider]  methocarbamol (ROBAXIN-750) 750 MG tablet Take 1 tablet (750 mg total) by mouth 3 (three) times daily as needed for muscle spasms. 06/30/20   Costella, Darci CurrentVincent J, PA-C  metolazone (ZAROXOLYN) 5 MG tablet Take 5 mg by mouth 3 (three) times a week.    [provider]  oxyCODONE (OXY IR/ROXICODONE) 5 MG immediate release tablet Take 1 tablet (5 mg total) by mouth every 4 (four) hours as needed for severe pain. 07/01/20   Costella, Darci CurrentVincent J, PA-C  pantoprazole (PROTONIX) 40 MG tablet Take 40 mg by mouth daily.    [provider]  pioglitazone (ACTOS) 15 MG tablet Take 15 mg by mouth daily.    [provider]  potassium chloride SA (KLOR-CON) 20 MEQ tablet Take 20 mEq by mouth daily.    [provider]  pramipexole (MIRAPEX) 0.5 MG tablet Take 1 mg by mouth at bedtime.    [provider]  pregabalin (LYRICA) 200 MG capsule Take 200 mg by mouth in the morning, at noon, and at bedtime.    [provider]  Semaglutide, 1 MG/DOSE, (OZEMPIC, 1 MG/DOSE,) 2 MG/1.5ML SOPN Inject 1 mg into the skin every Sunday.    [provider]  silver sulfADIAZINE (SILVADENE) 1 % cream Apply 1 application topically daily.    [provider]  spironolactone (ALDACTONE) 50 MG tablet Take 50 mg by mouth daily.    [provider]  torsemide (DEMADEX) 20 MG tablet Take 20 mg by mouth daily.    [provider]  Vitamin D, Ergocalciferol, (DRISDOL) 1.25 MG (50000 UNIT) CAPS capsule Take 50,000 Units by mouth every 7 (seven) days.    [provider]    Allergies    Amlodipine, Naltrexone, and Penicillins  Review of Systems   Review of Systems  All other systems reviewed and are negative.   Physical Exam Updated Vital Signs BP (!) 148/111 (BP Location: Right Arm)   Pulse  64   Temp 97.8 F (36.6 C) (Oral)   Resp 18   SpO2 90%   Physical Exam Vitals and nursing note reviewed.  Constitutional:      General: She is not in acute distress.    Appearance: She is well-developed. She is not ill-appearing, toxic-appearing or diaphoretic.  HENT:     Head: Normocephalic and atraumatic.     Right Ear: External ear normal.     Left Ear: External ear normal.  Eyes:     Conjunctiva/sclera: Conjunctivae normal.     Pupils: Pupils are equal, round, and reactive to light.  Neck:  Trachea: Phonation normal.  Cardiovascular:     Rate and Rhythm: Normal rate and regular rhythm.     Heart sounds: Normal heart sounds.  Pulmonary:     Effort: Pulmonary effort is normal.     Breath sounds: Normal breath sounds.  Abdominal:     General: There is no distension.  Musculoskeletal:        General: Normal range of motion.     Cervical back: Normal range of motion and neck supple.     Comments: She guards against movement of the low back secondary to pain.  She is able to roll over in the stretcher, to let me look at her back.  The incision of her mid lumbar area is healing without drainage, bleeding or swelling.  There is no significant focal tenderness to the lumbar region.  Lower extremities are without current edema.  There is skin discoloration consistent with prior venous insufficiency.  Skin:    General: Skin is warm and dry.  Neurological:     Mental Status: She is alert and oriented to person, place, and time.     Cranial Nerves: No cranial nerve deficit.     Sensory: No sensory deficit.     Motor: No abnormal muscle tone.     Coordination: Coordination normal.  Psychiatric:        Mood and Affect: Mood normal.        Behavior: Behavior normal.        Thought Content: Thought content normal.        Judgment: Judgment normal.     ED Results / Procedures / Treatments   Labs (all labs ordered are listed, but only abnormal results are displayed) Labs  Reviewed  SARS CORONAVIRUS 2 BY RT PCR (HOSPITAL ORDER, PERFORMED IN [facility])    EKG None  Radiology No results found.  Procedures Procedures (including critical care time)  Medications Ordered in ED Medications - No data to display  ED Course  I have reviewed the triage vital signs and the nursing notes.  Pertinent labs & imaging results that were available during my care of the patient were reviewed by me and considered in my medical decision making (see chart for details).  Clinical Course as of Jul 06 2102  Wed Jul 06, 2020  2059 I discussed the case with on-call neurosurgeon, Dr. Maisie Fus, who will "write admission orders."                     [EW]    Clinical Course User Index [EW] Mancel Bale, MD   MDM Rules/Calculators/A&P                           Patient Vitals for the past 24 hrs:  BP Temp Temp src Pulse Resp SpO2  07/06/20 1818 (!) 148/111 -- -- 64 18 90 %  07/06/20 1551 114/72 -- -- -- -- --  07/06/20 1530 (!) 98/48 97.8 F (36.6 C) Oral 65 18 96 %    8:59 PM Reevaluation with update and discussion. After initial assessment and treatment, an updated evaluation reveals no change in status, she is agreeable to hospitalization.  Findings discussed and questions answered. Mancel Bale   Medical Decision Making:  This patient is presenting for evaluation of back pain and numbness, post surgery, which does require a range of treatment options, and is a complaint that involves a moderate risk of morbidity and mortality.  Critical Interventions-clinical evaluation, laboratory testing, observation and reassessment  After These Interventions, the Patient was reevaluated and was found stable for admission to neurosurgery service to manage postoperative pain and numbness.  CRITICAL CARE-no Performed by: Mancel Bale  Nursing Notes Reviewed/ Care Coordinated Applicable Imaging Reviewed Interpretation of Laboratory Data incorporated into  ED treatment  I entered a bed request for hospitalization to be treated and managed by neurosurgery.    Final Clinical Impression(s) / ED Diagnoses Final diagnoses:  Postoperative back pain  Paresthesia    Rx / DC Orders ED Discharge Orders    None       Mancel Bale, MD 07/06/20 2103

## 2020-07-06 NOTE — ED Notes (Signed)
Pharmacy messaged to verify/send all medications

## 2020-07-06 NOTE — ED Triage Notes (Signed)
Pt reports right sided back pain that radiates down her hip and leg, pt had back surgery on 7/2, pt here 2 days ago but LWBS,  labs done then.

## 2020-07-06 NOTE — ED Notes (Addendum)
Richardson Dopp a call with any patient updates, [phone] .

## 2020-07-07 ENCOUNTER — Inpatient Hospital Stay (HOSPITAL_COMMUNITY): Payer: BC Managed Care – PPO

## 2020-07-07 LAB — GLUCOSE, CAPILLARY
Glucose-Capillary: 242 mg/dL — ABNORMAL HIGH (ref 70–99)
Glucose-Capillary: 308 mg/dL — ABNORMAL HIGH (ref 70–99)
Glucose-Capillary: 159 mg/dL — ABNORMAL HIGH (ref 70–99)
Glucose-Capillary: 267 mg/dL — ABNORMAL HIGH (ref 70–99)

## 2020-07-07 MED ORDER — SODIUM CHLORIDE 0.9 % IV SOLN
250.0000 mL | INTRAVENOUS | Status: DC
  Administered 2020-07-07: 250 mL via INTRAVENOUS

## 2020-07-07 MED ORDER — HYDRALAZINE HCL 10 MG PO TABS: 10.0000 mg | ORAL_TABLET | Freq: Four times a day (QID) | ORAL | Status: DC | PRN

## 2020-07-07 MED ORDER — EMPAGLIFLOZIN 25 MG PO TABS
25.0000 mg | ORAL_TABLET | Freq: Every day | ORAL | Status: DC
  Administered 2020-07-07 – 2020-07-13 (×7): 25 mg via ORAL
  Filled 2020-07-07 (×7): qty 1

## 2020-07-07 MED ORDER — DEXAMETHASONE 4 MG PO TABS
4.0000 mg | ORAL_TABLET | Freq: Four times a day (QID) | ORAL | Status: DC
  Administered 2020-07-07 – 2020-07-13 (×23): 4 mg via ORAL
  Filled 2020-07-07 (×24): qty 1

## 2020-07-07 MED ORDER — METFORMIN HCL ER 500 MG PO TB24
1000.0000 mg | ORAL_TABLET | Freq: Every day | ORAL | Status: DC
  Administered 2020-07-07 – 2020-07-13 (×7): 1000 mg via ORAL
  Filled 2020-07-07 (×7): qty 2

## 2020-07-07 NOTE — Progress Notes (Signed)
  NEUROSURGERY PROGRESS NOTE   Drastic improvement in back pain and RLE pain already Getting up as I walked in to use the restroom No new N/T/W  EXAM:  BP 137/67 (BP Location: Right Arm)   Pulse 79   Temp 98.2 F (36.8 C) (Oral)   Resp 13   SpO2 100%   Awake, alert, oriented  Speech fluent, appropriate  CN grossly intact  5/5 BUE/BLE  Incision: c/d/i  IMPRESSION/PLAN 64 y.o. female  S/p L4-5 fusion approx 1 week ago, currently admitted for pain control and ultimately SNF placement. Already improved compared to yesterday. - Xrays reviewed with Dr Conchita Paris. Possible subsidence of right cage into L5 body, but nothing to be done right now. Will plan on repeat xrays in 4 weeks. Okay to get up and work with therapy.  - continue steroids - CSW consulted for SNF

## 2020-07-07 NOTE — Progress Notes (Signed)
0000: Pt. Arrived to [facility] from ED. Aa&Ox4, VSS, pain 7/10, and old incision on back from 7/6 clean, dry, and intact. C/O pain and numbness in R leg. Will continue to monitor.

## 2020-07-07 NOTE — Progress Notes (Addendum)
Message left with [facility] for Dr. Conchita Paris regarding pt's x-ray results this am.   1226: Message with call-back number left with Cindra Presume, PA.   1230: Cindra Presume, PA, called and confirms seeing x-ray results. No new orders. He says that the pt is fine to be up and moving around.  Robina Ade, RN

## 2020-07-07 NOTE — Evaluation (Signed)
Physical Therapy Evaluation Patient Details Name: Brenda Morgan MRN: [id number] DOB: 01-Dec-1956 Today's Date: 07/07/2020   History of Present Illness  64 y.o. female with longstanding history of lower back and right greater than left leg pain.  Pain is severe and interferes with ADLs.  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 Pt underwent L4-5 posterior decompression and fusion on 06/28/2020. Pt now admitted due to increaed pain in low back and LEs since discharge. PMH includes:  sleep apnea, fatty liver disease, COPD, DM.  Clinical Impression  Pt presents to PT with deficits in functional mobility, gait, balance, endurance, power, safety awareness, device management. Pt with LE weakness and significant impaired activity tolerance. Pt is now without support from caregiver as he has broken an UE since last admission and is unable to physically assist. Pt will benefit form continued acute PT POC to improve activity tolerance and reduce falls risk. PT recommends SNF placement at this time to improve mobility quality and reduce falls risk while providing sufficient caregiver support.    Follow Up Recommendations SNF;Supervision/Assistance - 24 hour    Equipment Recommendations  None recommended by PT (pt owns necessary DME, defer to post-acute setting)    Recommendations for Other Services       Precautions / Restrictions Precautions Precautions: Fall;Back Precaution Booklet Issued: No Precaution Comments: review back precautions and brace use during session Required Braces or Orthoses: Other Brace Spinal Brace: Lumbar corset;Applied in sitting position Restrictions Weight Bearing Restrictions: No      Mobility  Bed Mobility               General bed mobility comments: pt received and left sitting in recliner  Transfers Overall transfer level: Needs assistance Equipment used: Rolling walker (2 wheeled) (upright  RW) Transfers: Sit to/from Stand Sit to Stand: Min guard Stand pivot transfers: Min guard       General transfer comment: pt requires minG to steady and some tactile cueing to direct during turns for stand pivot transfer while managing upright RW  Ambulation/Gait Ambulation/Gait assistance: Min guard Gait Distance (Feet): 70 Feet Assistive device: Rolling walker (2 wheeled) (upright RW) Gait Pattern/deviations: Step-through pattern Gait velocity: reduced Gait velocity interpretation: <1.8 ft/sec, indicate of risk for recurrent falls General Gait Details: pt with step through gait, increased trunk flexion and forward lean with fatigue near end of ambulation  Stairs            Wheelchair Mobility    Modified Rankin (Stroke Patients Only)       Balance Overall balance assessment: Needs assistance Sitting-balance support: No upper extremity supported;Feet supported Sitting balance-Leahy Scale: Fair Sitting balance - Comments: supervision for static sitting in recliner   Standing balance support: Bilateral upper extremity supported Standing balance-Leahy Scale: Poor Standing balance comment: reliant on BUE support of upright RW                             Pertinent Vitals/Pain Pain Assessment: 0-10 Pain Score: 7  Pain Location: low back Pain Descriptors / Indicators: Grimacing Pain Intervention(s): Monitored during session    Home Living Family/patient expects to be discharged to:: Skilled nursing facility Living Arrangements: Spouse/significant other Available Help at Discharge: Family;Available PRN/intermittently (spouse recently broke LUE in immobilizing sling) Type of Home: Mobile home Home Access: Ramped entrance     Home Layout: One level Home Equipment: Walker - 2 wheels;Shower seat;Grab bars -  toilet;Other (comment);Adaptive equipment Additional Comments: Pt reports spouse recently sustained UE fracture and is unable to assist her adequately  at home.      Prior Function Level of Independence: Needs assistance   Gait / Transfers Assistance Needed: Pt reports she has been ambulating with RW and assist at home   ADL's / Homemaking Assistance Needed: Pt reports she has had a difficult time with ADLs, and spouse has been unable to adequately assist her   Comments: pt does report one fall since last admission, reports it was partially caused by feeling lightheaded     Hand Dominance   Dominant Hand: Right    Extremity/Trunk Assessment   Upper Extremity Assessment Upper Extremity Assessment: Overall WFL for tasks assessed    Lower Extremity Assessment Lower Extremity Assessment: Generalized weakness    Cervical / Trunk Assessment Cervical / Trunk Assessment: Other exceptions Cervical / Trunk Exceptions: LSO  Communication   Communication: No difficulties  Cognition Arousal/Alertness: Awake/alert Behavior During Therapy: WFL for tasks assessed/performed;Flat affect Overall Cognitive Status: Impaired/Different from baseline Area of Impairment: Safety/judgement;Awareness;Memory                     Memory: Decreased recall of precautions Following Commands: Follows one step commands consistently Safety/Judgement: Decreased awareness of safety Awareness: Emergent Problem Solving: Slow processing        General Comments General comments (skin integrity, edema, etc.): VSS on RA, pt reports having one fall since discharge and that her spouse is unable to assist her now due to recent UE fracture    Exercises     Assessment/Plan    PT Assessment Patient needs continued PT services  PT Problem List Decreased strength;Decreased activity tolerance;Decreased balance;Decreased mobility;Decreased cognition;Decreased safety awareness;Decreased knowledge of use of DME;Decreased knowledge of precautions;Pain       PT Treatment Interventions DME instruction;Gait training;Functional mobility training;Therapeutic  activities;Therapeutic exercise;Balance training;Neuromuscular re-education;Cognitive remediation;Patient/family education    PT Goals (Current goals can be found in the Care Plan section)  Acute Rehab PT Goals Patient Stated Goal: to go to rehab PT Goal Formulation: With patient Time For Goal Achievement: 07/21/20 Potential to Achieve Goals: Good Additional Goals Additional Goal #1: Pt will maintain dynamic standing balance within 10 inches of her base of support with supervision and unilateral UE support of the LRAD    Frequency Min 3X/week   Barriers to discharge Decreased caregiver support      Co-evaluation               AM-PAC PT "6 Clicks" Mobility  Outcome Measure Help needed turning from your back to your side while in a flat bed without using bedrails?: A Little Help needed moving from lying on your back to sitting on the side of a flat bed without using bedrails?: A Little Help needed moving to and from a bed to a chair (including a wheelchair)?: A Little Help needed standing up from a chair using your arms (e.g., wheelchair or bedside chair)?: A Little Help needed to walk in hospital room?: A Little Help needed climbing 3-5 steps with a railing? : A Lot 6 Click Score: 17    End of Session Equipment Utilized During Treatment: Back brace;Gait belt Activity Tolerance: Patient tolerated treatment well Patient left: in chair;with call bell/phone within reach;with chair alarm set;with family/visitor present Nurse Communication: Mobility status PT Visit Diagnosis: Other abnormalities of gait and mobility (R26.89);Pain Pain - part of body:  (back)    Time: 0867-6195 PT Time Calculation (  min) (ACUTE ONLY): 18 min   Charges:   PT Evaluation $PT Eval Moderate Complexity: 1 Mod          Arlyss Gandy, PT, DPT Acute Rehabilitation Pager: [phone]   Arlyss Gandy 07/07/2020, 2:59 PM

## 2020-07-07 NOTE — Evaluation (Signed)
Occupational Therapy Evaluation Patient Details Name: Brenda Morgan MRN: [id number] DOB: 04-18-1956 Today's Date: 07/07/2020    History of Present Illness 64 y.o. female with longstanding history of lower back and right greater than left leg pain.  Pain is severe and interferes with ADLs.  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 Pt underwent L4-5 posterior decompression and fusion on 06/28/2020. Pt now admitted due to increaed pain in low back and LEs since surgery. PMH includes:  sleep apnea, fatty liver disease, COPD, DM   Clinical Impression   Pt admitted with above. She demonstrates the below listed deficits and will benefit from continued OT to maximize safety and independence with BADLs.  Pt is familiar to this OT from previous admission.  She currently requires set up to mod A for ADLs.  She reports 10/10 pain, and reports she was having significant difficulty performing ADLs, and mobilizing at home.  She also reports spouse recently sustained a UE fracture and has been unable to assist her adequately.  Recommend SNF level rehab.      Follow Up Recommendations  SNF;Supervision/Assistance - 24 hour    Equipment Recommendations  None recommended by OT    Recommendations for Other Services       Precautions / Restrictions Precautions Precautions: Fall;Back Precaution Booklet Issued: No Precaution Comments: Pt recalls "BLT", but requires cues to adhere to precautions.   Required Braces or Orthoses: Other Brace (No brace required.  Pt does have brace from previous admissi) Restrictions Weight Bearing Restrictions: No      Mobility Bed Mobility               General bed mobility comments: Pt sitting on EOB with RN   Transfers Overall transfer level: Needs assistance Equipment used: Rolling walker (2 wheeled) Transfers: Sit to/from [employer] Sit to Stand: Min assist Stand pivot transfers: Min  guard       General transfer comment: verbal cues for hand placement and assist to move into standing.      Balance Overall balance assessment: Needs assistance Sitting-balance support: No upper extremity supported;Feet supported Sitting balance-Leahy Scale: Fair     Standing balance support: Single extremity supported;During functional activity Standing balance-Leahy Scale: Poor Standing balance comment: reliant on UE support                            ADL either performed or assessed with clinical judgement   ADL Overall ADL's : Needs assistance/impaired Eating/Feeding: Set up;Sitting   Grooming: Wash/dry hands;Wash/dry face;Oral care;Brushing hair;Minimal assistance;Standing   Upper Body Bathing: Set up;Supervision/ safety;Sitting   Lower Body Bathing: Sit to/from stand;Moderate assistance   Upper Body Dressing : Set up;Sitting;Minimal assistance   Lower Body Dressing: Moderate assistance;Sit to/from stand   Toilet Transfer: Minimal assistance;Ambulation;Grab bars;BSC;RW Toilet Transfer Details (indicate cue type and reason): verbal cues for hand placement and precautions as well as assist to move sit to stand  Toileting- Clothing Manipulation and Hygiene: Minimal assistance;Sit to/from stand Toileting - Clothing Manipulation Details (indicate cue type and reason): assist to manage pull ups              Vision         Perception     Praxis      Pertinent Vitals/Pain Pain Assessment: 0-10 Pain Score: 10-Worst pain ever Pain Location: low back Pain Descriptors / Indicators: Guarding;Burning Pain Intervention(s): Monitored during session;Repositioned  Hand Dominance Right   Extremity/Trunk Assessment Upper Extremity Assessment Upper Extremity Assessment: Overall WFL for tasks assessed   Lower Extremity Assessment Lower Extremity Assessment: Defer to PT evaluation   Cervical / Trunk Assessment Cervical / Trunk Assessment: Other  exceptions Cervical / Trunk Exceptions: s/p lumbar surgery    Communication Communication Communication: No difficulties;Other (comment)   Cognition Arousal/Alertness: Awake/alert Behavior During Therapy: WFL for tasks assessed/performed;Flat affect Overall Cognitive Status: No family/caregiver present to determine baseline cognitive functioning                                 General Comments: Pt able to answer questions re: PLOF, but requires cues to recall precautions    General Comments  pt tearful at times during session     Exercises     Shoulder Instructions      Home Living Family/patient expects to be discharged to:: Skilled nursing facility Living Arrangements: Spouse/significant other Available Help at Discharge: Family;Available PRN/intermittently Type of Home: Mobile home Home Access: Ramped entrance     Home Layout: One level     Bathroom Shower/Tub: Arts development officer Toilet: Handicapped height     Home Equipment: Environmental consultant - 2 wheels;Shower seat;Grab bars - toilet;Other (comment);Adaptive equipment Adaptive Equipment: Reacher Additional Comments: Pt reports spouse recently sustained UE fracture and is unable to assist her adequately at home.        Prior Functioning/Environment Level of Independence: Needs assistance  Gait / Transfers Assistance Needed: Pt reports she has been ambulating with RW and assist at home  ADL's / Homemaking Assistance Needed: Pt reports she has had a difficult time with ADLs, and spouse has been unable to adequately assist her    Comments: [facility] reports that [facility] never got started at home         OT Problem List: Decreased strength;Decreased activity tolerance;Impaired balance (sitting and/or standing);Decreased cognition;Decreased knowledge of use of DME or AE;Decreased safety awareness;Decreased knowledge of precautions;Pain      OT Treatment/Interventions: Self-care/ADL training;Therapeutic  exercise;Energy conservation;DME and/or AE instruction;Therapeutic activities;Patient/family education;Balance training    OT Goals(Current goals can be found in the care plan section) Acute Rehab OT Goals Patient Stated Goal: To go to rehab to regain independence  OT Goal Formulation: With patient Time For Goal Achievement: 07/21/20 Potential to Achieve Goals: Good ADL Goals Pt Will Perform Grooming: with supervision;standing Pt Will Perform Lower Body Bathing: with min guard assist;with adaptive equipment;sit to/from stand Pt Will Perform Lower Body Dressing: with min guard assist;with adaptive equipment;sit to/from stand Pt Will Transfer to Toilet: with supervision;ambulating;stand pivot transfer;bedside commode;grab bars Pt Will Perform Toileting - Clothing Manipulation and hygiene: with supervision;sit to/from stand  OT Frequency: Min 2X/week   Barriers to D/C: Decreased caregiver support          Co-evaluation              AM-PAC OT "6 Clicks" Daily Activity     Outcome Measure Help from another person eating meals?: None Help from another person taking care of personal grooming?: A Little Help from another person toileting, which includes using toliet, bedpan, or urinal?: A Little Help from another person bathing (including washing, rinsing, drying)?: A Lot Help from another person to put on and taking off regular upper body clothing?: A Little Help from another person to put on and taking off regular lower body clothing?: A Lot 6 Click Score: 17  End of Session Equipment Utilized During Treatment: Gait belt;Rolling walker Nurse Communication: Mobility status  Activity Tolerance: Patient tolerated treatment well Patient left: in chair;with call bell/phone within reach;with chair alarm set  OT Visit Diagnosis: Unsteadiness on feet (R26.81);Pain Pain - part of body:  (back)                Time: 5188-4166 OT Time Calculation (min): 16 min Charges:  OT General  Charges $OT Visit: 1 Visit OT Evaluation $OT Eval Moderate Complexity: 1 Mod  Eber Jones., OTR/L Acute Rehabilitation Services Pager [phone] Office [phone]   Jeani Hawking M 07/07/2020, 9:39 AM

## 2020-07-07 NOTE — H&P (Signed)
Chief Complaint   Chief Complaint  Patient presents with  . Back Pain    HPI   HPI: Brenda Morgan is a 64 y.o. female who underwent L4-5 decompression and fusion 06/28/2020 with discharge 07/02/2019 who presented to the office yesterday complaining of worsening back pain and right leg pain several days after discharge.  Pain had been severe and interfering with ADLs. In addition her husband also fell and broke his arm and has been unable to care for her at home.  She felt as though she needed to be admitted to a rehab facility short term.  We attempted a direct admit however there was a 30 bed wait.  The patient was instructed to come to the emergency room for evaluation and ultimate admission for pain control, rehab and SNF placement.  Patient Active Problem List   Diagnosis Date Noted  . Postoperative back pain 07/06/2020  . Lumbar radiculopathy 07/06/2020  . Lumbar spinal stenosis 06/28/2020    PMH: Past Medical History:  Diagnosis Date  . COPD (chronic obstructive pulmonary disease) (HCC)   . Diabetes mellitus without complication (HCC)   . Fatty liver 2012   per pt 06/24/20  . Sleep apnea     PSH: Past Surgical History:  Procedure Laterality Date  . carotid artery surgery Right 10/07/2012  . carotid ultrasound  06/2015  . CHOLECYSTECTOMY  1981  . COLONOSCOPY  2014  . DIAGNOSTIC MAMMOGRAM  03/2017  . groin lypnoid Left 03/2015  . right heel spur  1994  . TUBAL LIGATION  1985  . wrist trigger release Right 01/2016    Medications Prior to Admission  Medication Sig Dispense Refill Last Dose  . atorvastatin (LIPITOR) 10 MG tablet Take 5 mg by mouth daily.   07/06/2020 at Unknown time  . baclofen (LIORESAL) 20 MG tablet Take 20 mg by mouth 3 (three) times daily.   Past Week at Unknown time  . carvedilol (COREG) 25 MG tablet Take 25 mg by mouth daily.   07/06/2020 at Unknown time  . cetirizine (ZYRTEC) 10 MG tablet Take 10 mg by mouth daily.   07/06/2020 at Unknown time    . Cholecalciferol (VITAMIN D) 50 MCG (2000 UT) tablet Take 2,000 Units by mouth daily.   07/06/2020 at Unknown time  . Dietary Management Product (OMNIVEX PO) Take 1 tablet by mouth daily.   07/06/2020 at Unknown time  . Empagliflozin-metFORMIN HCl ER (SYNJARDY XR) 25-1000 MG TB24 Take 1 tablet by mouth daily.   07/06/2020 at Unknown time  . famotidine (PEPCID) 40 MG tablet Take 40 mg by mouth daily.   07/06/2020 at Unknown time  . ferrous sulfate 325 (65 FE) MG tablet Take 325 mg by mouth daily with breakfast.   07/06/2020 at Unknown time  . fluticasone (FLONASE) 50 MCG/ACT nasal spray Place 1 spray into both nostrils 2 (two) times daily as needed for allergies or rhinitis.   Past Week at Unknown time  . Fluticasone-Salmeterol (ADVAIR) 250-50 MCG/DOSE AEPB Inhale 1 puff into the lungs 2 (two) times daily.   07/06/2020 at Unknown time  . icosapent Ethyl (VASCEPA) 1 g capsule Take 1 g by mouth 2 (two) times daily.   07/06/2020 at Unknown time  . insulin aspart protamine- aspart (NOVOLOG MIX 70/30) (70-30) 100 UNIT/ML injection Inject 35-55 Units into the skin 2 (two) times daily with a meal.   07/06/2020 at Unknown time  . ipratropium-albuterol (DUONEB) 0.5-2.5 (3) MG/3ML SOLN Take 3 mLs by nebulization every 6 (six)  hours as needed (asthma).   unknown at Unknown time  . lactulose (CHRONULAC) 10 GM/15ML solution Take 10 g by mouth 3 (three) times daily.   07/06/2020 at Unknown time  . lamoTRIgine (LAMICTAL) 150 MG tablet Take 150 mg by mouth daily.   07/06/2020 at Unknown time  . magnesium oxide (MAG-OX) 400 MG tablet Take 400 mg by mouth daily.   07/06/2020 at Unknown time  . methocarbamol (ROBAXIN-750) 750 MG tablet Take 1 tablet (750 mg total) by mouth 3 (three) times daily as needed for muscle spasms. 90 tablet 1 07/06/2020 at Unknown time  . metolazone (ZAROXOLYN) 5 MG tablet Take 5 mg by mouth 3 (three) times a week.   07/05/2020 at Unknown time  . oxyCODONE (OXY IR/ROXICODONE) 5 MG immediate release tablet  Take 1 tablet (5 mg total) by mouth every 4 (four) hours as needed for severe pain. 60 tablet 0 07/06/2020 at Unknown time  . pantoprazole (PROTONIX) 40 MG tablet Take 40 mg by mouth daily.   07/06/2020 at Unknown time  . pioglitazone (ACTOS) 15 MG tablet Take 15 mg by mouth daily.   07/06/2020 at Unknown time  . potassium chloride SA (KLOR-CON) 20 MEQ tablet Take 20 mEq by mouth daily.   07/06/2020 at Unknown time  . pramipexole (MIRAPEX) 0.5 MG tablet Take 1 mg by mouth at bedtime.   07/05/2020 at Unknown time  . pregabalin (LYRICA) 200 MG capsule Take 200 mg by mouth in the morning, at noon, and at bedtime.   07/06/2020 at Unknown time  . Semaglutide, 1 MG/DOSE, (OZEMPIC, 1 MG/DOSE,) 2 MG/1.5ML SOPN Inject 1 mg into the skin every Sunday.   Past Week at Unknown time  . silver sulfADIAZINE (SILVADENE) 1 % cream Apply 1 application topically daily as needed (leg sores).    07/06/2020 at Unknown time  . spironolactone (ALDACTONE) 50 MG tablet Take 50 mg by mouth daily.   07/06/2020 at Unknown time  . torsemide (DEMADEX) 20 MG tablet Take 20 mg by mouth daily.   07/06/2020 at Unknown time  . Vitamin D, Ergocalciferol, (DRISDOL) 1.25 MG (50000 UNIT) CAPS capsule Take 50,000 Units by mouth every 7 (seven) days.   Past Week at Unknown time    SH: Social History   Tobacco Use  . Smoking status: Current Every Day Smoker    Packs/day: 0.50    Types: Cigarettes  . Smokeless tobacco: Never Used  Substance Use Topics  . Alcohol use: Not Currently  . Drug use: Not Currently    MEDS: Prior to Admission medications   Medication Sig Start Date End Date Taking? Authorizing Provider  atorvastatin (LIPITOR) 10 MG tablet Take 5 mg by mouth daily.   Yes [provider]  baclofen (LIORESAL) 20 MG tablet Take 20 mg by mouth 3 (three) times daily.   Yes [provider]  carvedilol (COREG) 25 MG tablet Take 25 mg by mouth daily.   Yes [provider]  cetirizine (ZYRTEC) 10 MG tablet Take  10 mg by mouth daily.   Yes [provider]  Cholecalciferol (VITAMIN D) 50 MCG (2000 UT) tablet Take 2,000 Units by mouth daily.   Yes [provider]  Dietary Management Product (OMNIVEX PO) Take 1 tablet by mouth daily.   Yes [provider]  Empagliflozin-metFORMIN HCl ER (SYNJARDY XR) 25-1000 MG TB24 Take 1 tablet by mouth daily.   Yes [provider]  famotidine (PEPCID) 40 MG tablet Take 40 mg by mouth daily.  Yes [provider]  ferrous sulfate 325 (65 FE) MG tablet Take 325 mg by mouth daily with breakfast.   Yes [provider]  fluticasone (FLONASE) 50 MCG/ACT nasal spray Place 1 spray into both nostrils 2 (two) times daily as needed for allergies or rhinitis.   Yes [provider]  Fluticasone-Salmeterol (ADVAIR) 250-50 MCG/DOSE AEPB Inhale 1 puff into the lungs 2 (two) times daily.   Yes [provider]  icosapent Ethyl (VASCEPA) 1 g capsule Take 1 g by mouth 2 (two) times daily.   Yes [provider]  insulin aspart protamine- aspart (NOVOLOG MIX 70/30) (70-30) 100 UNIT/ML injection Inject 35-55 Units into the skin 2 (two) times daily with a meal.   Yes [provider]  ipratropium-albuterol (DUONEB) 0.5-2.5 (3) MG/3ML SOLN Take 3 mLs by nebulization every 6 (six) hours as needed (asthma).   Yes [provider]  lactulose (CHRONULAC) 10 GM/15ML solution Take 10 g by mouth 3 (three) times daily.   Yes [provider]  lamoTRIgine (LAMICTAL) 150 MG tablet Take 150 mg by mouth daily.   Yes [provider]  magnesium oxide (MAG-OX) 400 MG tablet Take 400 mg by mouth daily.   Yes [provider]  methocarbamol (ROBAXIN-750) 750 MG tablet Take 1 tablet (750 mg total) by mouth 3 (three) times daily as needed for muscle spasms. 06/30/20  Yes Bo Rogue, Darci CurrentVincent J, PA-C  metolazone (ZAROXOLYN) 5 MG tablet Take 5 mg by mouth 3 (three) times a week.   Yes [provider]  oxyCODONE (OXY IR/ROXICODONE) 5 MG immediate release tablet Take 1 tablet (5 mg total) by mouth every 4 (four) hours as needed for severe pain. 07/01/20  Yes Keidy Thurgood, Darci CurrentVincent J, PA-C  pantoprazole (PROTONIX) 40 MG tablet Take 40 mg by mouth daily.   Yes [provider]  pioglitazone (ACTOS) 15 MG tablet Take 15 mg by mouth daily.   Yes [provider]  potassium chloride SA (KLOR-CON) 20 MEQ tablet Take 20 mEq by mouth daily.   Yes [provider]  pramipexole (MIRAPEX) 0.5 MG tablet Take 1 mg by mouth at bedtime.   Yes [provider]  pregabalin (LYRICA) 200 MG capsule Take 200 mg by mouth in the morning, at noon, and at bedtime.   Yes [provider]  Semaglutide, 1 MG/DOSE, (OZEMPIC, 1 MG/DOSE,) 2 MG/1.5ML SOPN Inject 1 mg into the skin every Sunday.   Yes [provider]  silver sulfADIAZINE (SILVADENE) 1 % cream Apply 1 application topically daily as needed (leg sores).    Yes [provider]  spironolactone (ALDACTONE) 50 MG tablet Take 50 mg by mouth daily.   Yes [provider]  torsemide (DEMADEX) 20 MG tablet Take 20 mg by mouth daily.   Yes [provider]  Vitamin D, Ergocalciferol, (DRISDOL) 1.25 MG (50000 UNIT) CAPS capsule Take 50,000 Units by mouth every 7 (seven) days.   Yes [provider]    ALLERGY: Allergies  Allergen Reactions  . Amlodipine Anaphylaxis, Swelling and Other (See Comments)    Tongue swelling   . Naltrexone Hives  . Penicillins Hives and Other (See Comments)    Tolerated ceftriaxone and cefepime 06/2017 Hives      Social History   Tobacco Use  . Smoking status: Current Every Day Smoker    Packs/day: 0.50    Types: Cigarettes  . Smokeless tobacco: Never Used  Substance Use Topics  . Alcohol use: Not Currently  No family history on file.   ROS   ROS  Exam   Vitals:   07/07/20 0811 07/07/20 0824  BP: (!) 120/59   Pulse: 73   Resp: 16     Temp: 98 F (36.7 C)   SpO2: (!) 85% 93%   General appearance: WDWN, NAD Eyes: No scleral injection Cardiovascular: Regular rate and rhythm without murmurs, rubs, gallops. No edema or variciosities. Distal pulses normal. Pulmonary: Effort normal, non-labored breathing Musculoskeletal:     Muscle tone upper extremities: Normal    Muscle tone lower extremities: Normal    Motor exam: unable to do formal strength testing due to pain in BLE Neurological Mental Status:    - Patient is awake, alert, oriented to person, place, month, year, and situation    - Patient is able to give a clear and coherent history.    - No signs of aphasia or neglect Cranial Nerves    - II: Visual Fields are full. PERRL    - III/IV/VI: EOMI without ptosis or diploplia.     - V: Facial sensation is grossly normal    - VII: Facial movement is symmetric.     - VIII: hearing is intact to voice    - X: Uvula elevates symmetrically    - XI: Shoulder shrug is symmetric.    - XII: tongue is midline without atrophy or fasciculations.  Sensory: Sensation grossly intact to LT  Results - Imaging/Labs   Results for orders placed or performed during the hospital encounter of 07/06/20 (from the past 48 hour(s))  SARS Coronavirus 2 by RT PCR (hospital order, performed in [facility] hospital lab) Nasopharyngeal Nasopharyngeal Swab     Status: None   Collection Time: 07/06/20  9:26 PM   Specimen: Nasopharyngeal Swab  Result Value Ref Range   SARS Coronavirus 2 NEGATIVE NEGATIVE    Comment: (NOTE) SARS-CoV-2 target nucleic acids are NOT DETECTED.  The SARS-CoV-2 RNA is generally detectable in upper and lower respiratory specimens during the acute phase of infection. The lowest concentration of SARS-CoV-2 viral copies this assay can detect is 250 copies / mL. A negative result does not preclude SARS-CoV-2 infection and should not be used as the sole basis for treatment or other patient management decisions.  A  negative result may occur with improper specimen collection / handling, submission of specimen other than nasopharyngeal swab, presence of viral mutation(s) within the areas targeted by this assay, and inadequate number of viral copies (<250 copies / mL). A negative result must be combined with clinical observations, patient history, and epidemiological information.  Fact Sheet for Patients:   BoilerBrush.com.cy  Fact Sheet for Healthcare Providers: https://pope.com/  This test is not yet approved or  cleared by the [place] FDA and has been authorized for detection and/or diagnosis of SARS-CoV-2 by FDA under an Emergency Use Authorization (EUA).  This EUA will remain in effect (meaning this test can be used) for the duration of the COVID-19 declaration under Section 564(b)(1) of the Act, 21 U.S.C. section 360bbb-3(b)(1), unless the authorization is terminated or revoked sooner.  Performed at [facility], [address] [place], [place] [address]   CBG monitoring, ED     Status: Abnormal   Collection Time: 07/06/20 10:29 PM  Result Value Ref Range   Glucose-Capillary 183 (H) 70 - 99 mg/dL    Comment: Glucose reference range applies only to samples taken after fasting for at least 8 hours.   Comment 1 Notify  RN    Comment 2 Document in Chart     No results found.  Impression/Plan   64 y.o. female one week status post L4-5 decompression and fusion with worsening back and right leg pain.  Lower extremity strength testing is difficult due to the severity of her pain.  Plan to admit for pain control, therapy and ultimately SNF placement.  Back and right leg pain, s/p L4-5 decompression and fusion: - suspect inflammatory response - will obtain Xrays to assess hardware - start on decadron - PT/OT - CSW consult for SNF placement  Mild AKI - IVF, trend BMP  Cindra Presume, PA-C [facility]

## 2020-07-08 LAB — GLUCOSE, CAPILLARY
Glucose-Capillary: 211 mg/dL — ABNORMAL HIGH (ref 70–99)
Glucose-Capillary: 187 mg/dL — ABNORMAL HIGH (ref 70–99)
Glucose-Capillary: 183 mg/dL — ABNORMAL HIGH (ref 70–99)
Glucose-Capillary: 129 mg/dL — ABNORMAL HIGH (ref 70–99)

## 2020-07-08 LAB — BASIC METABOLIC PANEL
Anion gap: 13 (ref 5–15)
BUN: 28 mg/dL — ABNORMAL HIGH (ref 8–23)
CO2: 29 mmol/L (ref 22–32)
Calcium: 9.7 mg/dL (ref 8.9–10.3)
Chloride: 95 mmol/L — ABNORMAL LOW (ref 98–111)
Creatinine, Ser: 1.3 mg/dL — ABNORMAL HIGH (ref 0.44–1.00)
GFR calc Af Amer: 51 mL/min — ABNORMAL LOW (ref >60)
GFR calc non Af Amer: 44 mL/min — ABNORMAL LOW (ref >60)
Glucose, Bld: 190 mg/dL — ABNORMAL HIGH (ref 70–99)
Potassium: 3.6 mmol/L (ref 3.5–5.1)
Sodium: 137 mmol/L (ref 135–145)

## 2020-07-08 NOTE — [place] FL2 (Signed)
[place] MEDICAID FL2 LEVEL OF CARE SCREENING TOOL     IDENTIFICATION  Patient Name:  Brenda Morgan Birthdate: 08/19/1956 Sex: female Admission Date (Current Location): 07/06/2020  [facility] [place] and [place] Number:  Producer, television/film/video and Address:  [facility], [address] [place], [place] [address]      Provider Number: [id number]  Attending Physician Name and Address:  Lisbeth Renshaw, MD  Relative Name and Phone Number:  Fayrene Fearing, spouse    Current Level of Care: Hospital Recommended Level of Care: Skilled Nursing Facility Prior Approval Number:    Date Approved/Denied:   PASRR Number:    Discharge Plan: SNF    Current Diagnoses: Patient Active Problem List   Diagnosis Date Noted  . Postoperative back pain 07/06/2020  . Lumbar radiculopathy 07/06/2020  . Lumbar spinal stenosis 06/28/2020    Orientation RESPIRATION BLADDER Height & Weight     Self, Time, Situation, Place  Normal Continent Weight:   Height:     BEHAVIORAL SYMPTOMS/MOOD NEUROLOGICAL BOWEL NUTRITION STATUS      Continent Diet (See discharge summary)  AMBULATORY STATUS COMMUNICATION OF NEEDS Skin   Limited Assist Verbally Normal                       Personal Care Assistance Level of Assistance  Bathing, Feeding, Dressing Bathing Assistance: Limited assistance Feeding assistance: Independent Dressing Assistance: Limited assistance     Functional Limitations Info  Sight, Hearing, Speech Sight Info: Adequate Hearing Info: Adequate Speech Info: Adequate    SPECIAL CARE FACTORS FREQUENCY  PT (By licensed PT), OT (By licensed OT)     PT Frequency: 5x a week OT Frequency: 5x a week            Contractures Contractures Info: Not present    Additional Factors Info  Code Status, Allergies Code Status Info: Full Allergies Info: Amlodipine; Naltrexone; Penicillins           Current Medications (07/08/2020):  This is the current hospital active  medication list Current Facility-Administered Medications  Medication Dose Route Frequency Provider Last Rate Last Admin  . 0.9 %  sodium chloride infusion  250 mL Intravenous Continuous Costella, Vincent J, PA-C 100 mL/hr at 07/07/20 1100 250 mL at 07/07/20 1100  . acetaminophen (TYLENOL) tablet 650 mg  650 mg Oral Q4H PRN Bedelia Person, MD       Or  . acetaminophen (TYLENOL) suppository 650 mg  650 mg Rectal Q4H PRN Bedelia Person, MD      . atorvastatin (LIPITOR) tablet 5 mg  5 mg Oral Daily Bedelia Person, MD   5 mg at 07/08/20 0859  . carvedilol (COREG) tablet 25 mg  25 mg Oral Daily Bedelia Person, MD   25 mg at 07/08/20 0859  . cholecalciferol (VITAMIN D3) tablet 2,000 Units  2,000 Units Oral Daily Bedelia Person, MD   2,000 Units at 07/08/20 0859  . dexamethasone (DECADRON) tablet 4 mg  4 mg Oral Q6H Costella, Vincent J, PA-C   4 mg at 07/08/20 0500  . docusate sodium (COLACE) capsule 100 mg  100 mg Oral BID Bedelia Person, MD   100 mg at 07/08/20 0859  . metFORMIN (GLUCOPHAGE-XR) 24 hr tablet 1,000 mg  1,000 mg Oral Q breakfast Lisbeth Renshaw, MD   1,000 mg at 07/08/20 0856   And  . empagliflozin (JARDIANCE) tablet 25 mg  25 mg Oral QAC breakfast Lisbeth Renshaw, MD  25 mg at 07/08/20 0856  . enoxaparin (LOVENOX) injection 40 mg  40 mg Subcutaneous Q24H Bedelia Person, MD   40 mg at 07/08/20 0853  . famotidine (PEPCID) tablet 40 mg  40 mg Oral Daily Bedelia Person, MD   40 mg at 07/08/20 0857  . ferrous sulfate tablet 325 mg  325 mg Oral Q breakfast Bedelia Person, MD   325 mg at 07/08/20 0859  . fluticasone (FLONASE) 50 MCG/ACT nasal spray 1 spray  1 spray Each Nare BID PRN Bedelia Person, MD      . hydrALAZINE (APRESOLINE) tablet 10 mg  10 mg Oral Q6H PRN Costella, Darci Current, PA-C      . icosapent Ethyl (VASCEPA) 1 g capsule 1 g  1 g Oral BID Bedelia Person, MD   1 g at 07/08/20 0856  . insulin aspart (novoLOG) injection 0-15 Units   0-15 Units Subcutaneous TID WC Bedelia Person, MD   5 Units at 07/08/20 [phone]  . insulin aspart (novoLOG) injection 0-5 Units  0-5 Units Subcutaneous QHS Bedelia Person, MD   3 Units at 07/07/20 2248  . insulin aspart protamine- aspart (NOVOLOG MIX 70/30) injection 35 Units  35 Units Subcutaneous BID WC Bedelia Person, MD   35 Units at 07/08/20 0854  . ipratropium-albuterol (DUONEB) 0.5-2.5 (3) MG/3ML nebulizer solution 3 mL  3 mL Nebulization Q6H PRN Bedelia Person, MD      . lactulose (CHRONULAC) 10 GM/15ML solution 10 g  10 g Oral TID Bedelia Person, MD   10 g at 07/08/20 0859  . lamoTRIgine (LAMICTAL) tablet 150 mg  150 mg Oral Daily Bedelia Person, MD   150 mg at 07/08/20 0857  . loratadine (CLARITIN) tablet 10 mg  10 mg Oral Daily Bedelia Person, MD   10 mg at 07/08/20 0858  . magnesium oxide (MAG-OX) tablet 400 mg  400 mg Oral Daily Bedelia Person, MD   400 mg at 07/08/20 0857  . menthol-cetylpyridinium (CEPACOL) lozenge 3 mg  1 lozenge Oral PRN Bedelia Person, MD       Or  . phenol (CHLORASEPTIC) mouth spray 1 spray  1 spray Mouth/Throat PRN Bedelia Person, MD      . methocarbamol (ROBAXIN) tablet 750 mg  750 mg Oral TID PRN Bedelia Person, MD   750 mg at 07/07/20 0118  . metolazone (ZAROXOLYN) tablet 5 mg  5 mg Oral Once per day on Mon Wed Fri Bedelia Person, MD   5 mg at 07/08/20 0856  . mometasone-formoterol (DULERA) 200-5 MCG/ACT inhaler 2 puff  2 puff Inhalation BID Lisbeth Renshaw, MD   2 puff at 07/08/20 0846  . morphine 2 MG/ML injection 2 mg  2 mg Intravenous Q2H PRN Bedelia Person, MD   2 mg at 07/07/20 0118  . ondansetron (ZOFRAN) tablet 4 mg  4 mg Oral Q6H PRN Bedelia Person, MD       Or  . ondansetron [facility]) injection 4 mg  4 mg Intravenous Q6H PRN Bedelia Person, MD      . oxyCODONE (Oxy IR/ROXICODONE) immediate release tablet 5 mg  5 mg Oral Q4H PRN Bedelia Person, MD   5 mg at 07/08/20 0900  . pantoprazole  (PROTONIX) EC tablet 40 mg  40 mg Oral Daily Bedelia Person, MD   40 mg at 07/08/20 0858  . pioglitazone (ACTOS) tablet 15 mg  15 mg Oral  Daily Bedelia Person, MD   15 mg at 07/08/20 0856  . polyethylene glycol (MIRALAX / GLYCOLAX) packet 17 g  17 g Oral Daily PRN Bedelia Person, MD      . potassium chloride SA (KLOR-CON) CR tablet 20 mEq  20 mEq Oral Daily Bedelia Person, MD   20 mEq at 07/08/20 0855  . pramipexole (MIRAPEX) tablet 1 mg  1 mg Oral QHS Bedelia Person, MD   1 mg at 07/07/20 2247  . pregabalin (LYRICA) capsule 200 mg  200 mg Oral TID Bedelia Person, MD   200 mg at 07/08/20 0859  . sodium chloride flush (NS) 0.9 % injection 3 mL  3 mL Intravenous Q12H Bedelia Person, MD   3 mL at 07/07/20 2248  . sodium chloride flush (NS) 0.9 % injection 3 mL  3 mL Intravenous PRN Bedelia Person, MD      . sodium phosphate (FLEET) 7-19 GM/118ML enema 1 enema  1 enema Rectal Once PRN Bedelia Person, MD         Discharge Medications: Please see discharge summary for a list of discharge medications.  Relevant Imaging Results:  Relevant Lab Results:   Additional Information SSN [id number] [place], [place]

## 2020-07-08 NOTE — Progress Notes (Signed)
Inpatient Diabetes Program Recommendations  AACE/ADA: New Consensus Statement on Inpatient Glycemic Control (2015)  Target Ranges:  Prepandial:   less than 140 mg/dL      Peak postprandial:   less than 180 mg/dL (1-2 hours)      Critically ill patients:  140 - 180 mg/dL   Lab Results  Component Value Date   GLUCAP 211 (H) 07/08/2020   HGBA1C 6.7 (H) 06/24/2020    Review of Glycemic Control Results for [name], Brenda Morgan (MRN [id number]) as of 07/08/2020 11:20  Ref. Range 07/07/2020 11:21 07/07/2020 15:34 07/07/2020 22:47 07/08/2020 07:49  Glucose-Capillary Latest Ref Range: 70 - 99 mg/dL 671 (H) 245 (H) 809 (H) 211 (H)   Diabetes history: Type 2 DM Outpatient Diabetes medications: Ozempic 1 mg Qwk, Novolog 70/30 35-55 units BID, Synjardy 25-1000 mg QD, Actos 15 mg QD Current orders for Inpatient glycemic control: Jardiance 25 mg QD, Actos 15 units QD, Metformin 1000 mg QAM, Novolog 70/30 35 units BID, Novolog 0-15 units TID, Novolog 0-5 units QHS Decadron 4 mg Q6H  Inpatient Diabetes Program Recommendations:    Consider: -Adding carb modified to diet modifications -Increasing Novolog 70/30 to 40 units BID while on steroids.   Thanks, Lujean Rave, MSN, RNC-OB Diabetes Coordinator [phone] (8a-5p)

## 2020-07-08 NOTE — TOC Progression Note (Addendum)
Transition of Care [facility]) - Progression Note    Patient Details  Name: Brenda Morgan MRN: [id number] Date of Birth: Apr 14, 1956  Transition of Care [facility]) CM/SW Contact  Nonda Lou, [place] Phone Number: 07/08/2020, 1:01 PM  Clinical Narrative:    CSW faxed referral to [facility] [phone]. CSW attempted to speak with admissions [phone], left a message on the nursing directors voicemail. CSW will continue to follow-up.    Expected Discharge Plan: Skilled Nursing Facility    Expected Discharge Plan and Services Expected Discharge Plan: Skilled Nursing Facility In-house Referral: Clinical Social Work Discharge Planning Services: CM Consult Post Acute Care Choice: Skilled Nursing Facility Living arrangements for the past 2 months: Mobile Home                                       Social Determinants of Health (SDOH) Interventions    Readmission Risk Interventions No flowsheet data found.

## 2020-07-08 NOTE — Progress Notes (Signed)
Physical Therapy Treatment Patient Details Name: Brenda Morgan MRN: [id number] DOB: 1956-06-05 Today's Date: 07/08/2020    History of Present Illness 64 y.o. female with longstanding history of lower back and right greater than left leg pain.  Pain is severe and interferes with ADLs.  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 Pt underwent L4-5 posterior decompression and fusion on 06/28/2020. Pt now admitted due to increaed pain in low back and LEs since discharge. PMH includes:  sleep apnea, fatty liver disease, COPD, DM.    PT Comments    Pt tolerates treatment well, demonstrating improved activity tolerance and transfer quality. Pt does continue to require cues to maintain back precautions with mobility, needing cues during bed mobility and to avoid twisting when turning to talk to other people in room. Due to pt's cognitive deficits and impaired recall of precautions, as well as her reduced caregiver support PT continues to recommend SNF placement. SNF placement will allow for continued improvement of mobility quality and education on back precautions, and provide the necessary caregiver support for ADLs and mobility that the pt requires at this time.   Follow Up Recommendations  SNF;Supervision/Assistance - 24 hour     Equipment Recommendations  None recommended by PT    Recommendations for Other Services       Precautions / Restrictions Precautions Precautions: Fall;Back Precaution Booklet Issued: No Precaution Comments: verbally review precautions, pt requries cues for application during session with mobility Required Braces or Orthoses: Other Brace Spinal Brace: Lumbar corset;Applied in sitting position Restrictions Weight Bearing Restrictions: No    Mobility  Bed Mobility Overal bed mobility: Needs Assistance Bed Mobility: Rolling;Sidelying to Sit Rolling: Supervision Sidelying to sit: Min guard       General bed  mobility comments: pt requires cues to perform log roll technique, initially attempting to go straight from supine to sit  Transfers Overall transfer level: Needs assistance Equipment used: Rolling walker (2 wheeled) Transfers: Sit to/from Stand Sit to Stand: Supervision         General transfer comment: pt does require PT assist to lock breaks on upright RW  Ambulation/Gait Ambulation/Gait assistance: Min guard Gait Distance (Feet): 200 Feet Assistive device: 4-wheeled walker (upright RW with 4 wheels) Gait Pattern/deviations: Step-through pattern Gait velocity: reduced Gait velocity interpretation: <1.8 ft/sec, indicate of risk for recurrent falls General Gait Details: pt with steady step through gait   Stairs             Wheelchair Mobility    Modified Rankin (Stroke Patients Only)       Balance Overall balance assessment: Needs assistance Sitting-balance support: No upper extremity supported;Feet supported Sitting balance-Leahy Scale: Good Sitting balance - Comments: supervision   Standing balance support: Single extremity supported;During functional activity Standing balance-Leahy Scale: Poor Standing balance comment: reliant on unilateral UE support                            Cognition Arousal/Alertness: Awake/alert Behavior During Therapy: WFL for tasks assessed/performed;Flat affect Overall Cognitive Status: Impaired/Different from baseline Area of Impairment: Safety/judgement;Awareness;Memory                     Memory: Decreased recall of precautions   Safety/Judgement: Decreased awareness of safety Awareness: Emergent          Exercises      General Comments General comments (skin integrity, edema, etc.): VSS on RA  Pertinent Vitals/Pain Pain Assessment: 0-10 Pain Score: 6  Pain Location: low back Pain Descriptors / Indicators: Grimacing Pain Intervention(s): Monitored during session    Home Living                       Prior Function            PT Goals (current goals can now be found in the care plan section) Acute Rehab PT Goals Patient Stated Goal: to go to rehab Progress towards PT goals: Progressing toward goals    Frequency    Min 3X/week      PT Plan Current plan remains appropriate    Co-evaluation              AM-PAC PT "6 Clicks" Mobility   Outcome Measure  Help needed turning from your back to your side while in a flat bed without using bedrails?: None Help needed moving from lying on your back to sitting on the side of a flat bed without using bedrails?: A Little Help needed moving to and from a bed to a chair (including a wheelchair)?: A Little Help needed standing up from a chair using your arms (e.g., wheelchair or bedside chair)?: A Little Help needed to walk in hospital room?: A Little Help needed climbing 3-5 steps with a railing? : A Lot 6 Click Score: 18    End of Session Equipment Utilized During Treatment: Back brace;Gait belt Activity Tolerance: Patient tolerated treatment well Patient left: in chair;with call bell/phone within reach;with chair alarm set Nurse Communication: Mobility status PT Visit Diagnosis: Other abnormalities of gait and mobility (R26.89);Pain Pain - part of body:  (back)     Time: 4854-6270 PT Time Calculation (min) (ACUTE ONLY): 23 min  Charges:  $Gait Training: 8-22 mins $Therapeutic Activity: 8-22 mins                     Arlyss Gandy, PT, DPT Acute Rehabilitation Pager: [phone]    Arlyss Gandy 07/08/2020, 10:28 AM

## 2020-07-08 NOTE — Progress Notes (Signed)
  NEUROSURGERY PROGRESS NOTE   No issues overnight.  Pain manageable No new N/T/W  EXAM:  BP 131/60 (BP Location: Right Arm)   Pulse 71   Temp 97.6 F (36.4 C) (Oral)   Resp 15   SpO2 94%   Awake, alert, oriented  Speech fluent, appropriate  CN grossly intact  5/5 BUE/BLE  Incision: c/d/i  IMPRESSION/PLAN 64 y.o. female S/p L4-5 fusion approx 1 week ago, currently admitted for pain control and ultimately SNF placement. Pain well controlled. - continue steroids - CSW consulted for SNF - dispo planning

## 2020-07-08 NOTE — TOC Initial Note (Addendum)
Transition of Care [facility]) - Initial/Assessment Note    Patient Details  Name: Brenda Morgan MRN: [id number] Date of Birth: [birth year]  [facility]) CM/SW Contact:    Lockie Pares, RN Phone Number: 07/08/2020, 10:23 AM  Clinical Narrative:                 Received a call from husband on unit. The called this RNCM. He has called some rehabilitation places close to him, [facility] is one that he would like [place] to place the patient with. I will speak to the patient and CSW. Phone number is [phone] Spoke to patient and she agrees  . She has been to [facility] previously and found it to be a good facility.  Chaney CSW aware of request.   Expected Discharge Plan: Skilled Nursing Facility     Patient Goals and CMS Choice Patient states their goals for this hospitalization and ongoing recovery are:: to rehab   Choice offered to / list presented to : Patient, Spouse  Expected Discharge Plan and Services Expected Discharge Plan: Skilled Nursing Facility In-house Referral: Clinical Social Work Discharge Planning Services: CM Consult Post Acute Care Choice: Skilled Nursing Facility Living arrangements for the past 2 months: Mobile Home                                      Prior Living Arrangements/Services Living arrangements for the past 2 months: Mobile Home Lives with:: Self, Spouse Patient language and need for interpreter reviewed:: Yes Do you feel safe going back to the place where you live?: Yes      Need for Family Participation in Patient Care: Yes (Comment) Care giver support system in place?: Yes (comment)   Criminal Activity/Legal Involvement Pertinent to Current Situation/Hospitalization: No - Comment as needed  Activities of Daily Living      Permission Sought/Granted      Share Information with NAME: Brenton Grills        Permission granted to share info w Contact Information: [phone]  Emotional  Assessment Appearance:: Appears stated age Attitude/Demeanor/Rapport: Engaged Affect (typically observed): Accepting Orientation: : Oriented to Self, Oriented to Place, Oriented to  Time Alcohol / Substance Use: Not Applicable Psych Involvement: No (comment)  Admission diagnosis:  Lumbar radiculopathy [M54.16] Paresthesia [R20.2] Postoperative back pain [G89.18] Patient Active Problem List   Diagnosis Date Noted  . Postoperative back pain 07/06/2020  . Lumbar radiculopathy 07/06/2020  . Lumbar spinal stenosis 06/28/2020   PCP:  Patient, No Pcp Per Pharmacy:   [id number] [place], [place] - [address] [facility] [address] [place] [address] Phone: [phone] Fax: [phone]     Social Determinants of Health (SDOH) Interventions    Readmission Risk Interventions No flowsheet data found.

## 2020-07-09 LAB — BASIC METABOLIC PANEL
Anion gap: 12 (ref 5–15)
BUN: 27 mg/dL — ABNORMAL HIGH (ref 8–23)
CO2: 27 mmol/L (ref 22–32)
Calcium: 9.4 mg/dL (ref 8.9–10.3)
Chloride: 92 mmol/L — ABNORMAL LOW (ref 98–111)
Creatinine, Ser: 0.93 mg/dL (ref 0.44–1.00)
GFR calc Af Amer: >60 mL/min (ref >60)
GFR calc non Af Amer: >60 mL/min (ref >60)
Glucose, Bld: 302 mg/dL — ABNORMAL HIGH (ref 70–99)
Potassium: 4 mmol/L (ref 3.5–5.1)
Sodium: 131 mmol/L — ABNORMAL LOW (ref 135–145)

## 2020-07-09 LAB — GLUCOSE, CAPILLARY
Glucose-Capillary: 261 mg/dL — ABNORMAL HIGH (ref 70–99)
Glucose-Capillary: 259 mg/dL — ABNORMAL HIGH (ref 70–99)
Glucose-Capillary: 142 mg/dL — ABNORMAL HIGH (ref 70–99)
Glucose-Capillary: 121 mg/dL — ABNORMAL HIGH (ref 70–99)

## 2020-07-09 MED ORDER — TRAZODONE HCL 150 MG PO TABS
75.0000 mg | ORAL_TABLET | Freq: Every day | ORAL | Status: DC
  Administered 2020-07-09 – 2020-07-12 (×4): 75 mg via ORAL
  Filled 2020-07-09 (×4): qty 1

## 2020-07-09 MED ORDER — NICOTINE 7 MG/24HR TD PT24
7.0000 mg | MEDICATED_PATCH | Freq: Every day | TRANSDERMAL | Status: DC
  Administered 2020-07-09 – 2020-07-12 (×4): 7 mg via TRANSDERMAL
  Filled 2020-07-09 (×6): qty 1

## 2020-07-09 MED ORDER — BISACODYL 5 MG PO TBEC: 5.0000 mg | DELAYED_RELEASE_TABLET | Freq: Every day | ORAL | Status: DC | PRN

## 2020-07-09 NOTE — Progress Notes (Signed)
Subjective: Patient reports moderate back pain still, no complaints of leg pain.   Objective: Vital signs in last 24 hours: Temp:  [97.6 F (36.4 C)-98.9 F (37.2 C)] 97.7 F (36.5 C) (07/17 0736) Pulse Rate:  [60-77] 60 (07/17 0736) Resp:  [14-18] 16 (07/17 0736) BP: (111-133)/(48-91) 133/66 (07/17 0736) SpO2:  [94 %-97 %] 97 % (07/17 0736)  Intake/Output from previous day: 07/16 0701 - 07/17 0700 In: 580 [P.O.:580] Out: -  Intake/Output this shift: No intake/output data recorded.  Neurologic: Grossly normal  Lab Results: Lab Results  Component Value Date   WBC 11.2 (H) 07/04/2020   HGB 11.5 (L) 07/04/2020   HCT 36.4 07/04/2020   MCV 92.2 07/04/2020   PLT 289 07/04/2020   Lab Results  Component Value Date   INR 1.1 06/29/2020   BMET Lab Results  Component Value Date   NA 137 07/08/2020   K 3.6 07/08/2020   CL 95 (L) 07/08/2020   CO2 29 07/08/2020   GLUCOSE 190 (H) 07/08/2020   BUN 28 (H) 07/08/2020   CREATININE 1.30 (H) 07/08/2020   CALCIUM 9.7 07/08/2020    Studies/Results: DG Lumbar Spine 2-3 Views  Result Date: 07/07/2020 CLINICAL DATA:  Lower back pain after surgery EXAM: LUMBAR SPINE - 2-3 VIEW COMPARISON:  Fluoroscopy from 06/28/2020 FINDINGS: Transitional anatomy. There has been lower lumbar PLIF with prominent superior endplate subsidence of the cage when compared with prior. Anterolisthesis which was also noted on the fluoroscopic images. No visible pedicle screw displacement. IMPRESSION: Early, prominent superior endplate subsidence of the recently placed intervertebral cage, consider CT to evaluate for fracture. Electronically Signed   By: Marnee Spring M.D.   On: 07/07/2020 10:16    Assessment/Plan: 64 year old patient 1 week out from PLIF, readmitted for pain control and SNF placement. Awaiting SNF placement for now.continue therapies today. Restarted Trazodone and bm medications. Ordered nicotine patch.   LOS: 3 days    Tiana Loft  [facility] 07/09/2020, 8:32 AM

## 2020-07-10 LAB — BASIC METABOLIC PANEL
Anion gap: 10 (ref 5–15)
BUN: 24 mg/dL — ABNORMAL HIGH (ref 8–23)
CO2: 29 mmol/L (ref 22–32)
Calcium: 9.4 mg/dL (ref 8.9–10.3)
Chloride: 95 mmol/L — ABNORMAL LOW (ref 98–111)
Creatinine, Ser: 0.99 mg/dL (ref 0.44–1.00)
GFR calc Af Amer: >60 mL/min (ref >60)
GFR calc non Af Amer: >60 mL/min (ref >60)
Glucose, Bld: 170 mg/dL — ABNORMAL HIGH (ref 70–99)
Potassium: 3.8 mmol/L (ref 3.5–5.1)
Sodium: 134 mmol/L — ABNORMAL LOW (ref 135–145)

## 2020-07-10 LAB — GLUCOSE, CAPILLARY
Glucose-Capillary: 232 mg/dL — ABNORMAL HIGH (ref 70–99)
Glucose-Capillary: 119 mg/dL — ABNORMAL HIGH (ref 70–99)
Glucose-Capillary: 233 mg/dL — ABNORMAL HIGH (ref 70–99)
Glucose-Capillary: 212 mg/dL — ABNORMAL HIGH (ref 70–99)

## 2020-07-10 NOTE — Progress Notes (Signed)
NEUROSURGERY PROGRESS NOTE  Doing well. Complains of appropriate back soreness. Ambulating with walker and voiding well Incision CDI   Temp:  [97.7 F (36.5 C)-98.6 F (37 C)] 97.8 F (36.6 C) (07/18 0811) Pulse Rate:  [66-78] 78 (07/17 2320) Resp:  [16-20] 20 (07/18 0811) BP: (115-158)/(51-64) 116/55 (07/18 0811) SpO2:  [97 %-100 %] 100 % (07/18 08156)  Plan: 64 year old female readmitted for pain control and SNF placement. Doing well, continue therapy today. Awaiting snf placement   Sherryl Manges, NP 07/10/2020 9:50 AM

## 2020-07-10 NOTE — TOC Progression Note (Signed)
Transition of Care [facility]) - Progression Note    Patient Details  Name: Brenda Morgan MRN: [id number] Date of Birth: 1956-09-01  [facility]) CM/SW Contact  Annalee Genta, LCSW Phone Number: 07/10/2020, 12:01 PM  Clinical Narrative:  CSW spoke with patient and spouse per request for an update. CSW informed them that CSW is awaiting for confirmation of facility being ready to accept patient but notes no recent update. Spouse reports they told him they were awaiting insurance authorization. CSW reached out to facility social Training and development officer for update and will provide family with update when attained.     Expected Discharge Plan: Skilled Nursing Facility    Expected Discharge Plan and Services Expected Discharge Plan: Skilled Nursing Facility In-house Referral: Clinical Social Work Discharge Planning Services: CM Consult Post Acute Care Choice: Skilled Nursing Facility Living arrangements for the past 2 months: Mobile Home                                       Social Determinants of Health (SDOH) Interventions    Readmission Risk Interventions No flowsheet data found.

## 2020-07-11 LAB — GLUCOSE, CAPILLARY
Glucose-Capillary: 227 mg/dL — ABNORMAL HIGH (ref 70–99)
Glucose-Capillary: 245 mg/dL — ABNORMAL HIGH (ref 70–99)
Glucose-Capillary: 200 mg/dL — ABNORMAL HIGH (ref 70–99)
Glucose-Capillary: 157 mg/dL — ABNORMAL HIGH (ref 70–99)

## 2020-07-11 MED ORDER — OXYCODONE HCL 5 MG PO TABS: 5.0000 mg | ORAL_TABLET | ORAL | 0 refills | Status: DC | PRN

## 2020-07-11 MED ORDER — METHYLPREDNISOLONE 4 MG PO TBPK: ORAL_TABLET | ORAL | 0 refills | Status: DC

## 2020-07-11 NOTE — Progress Notes (Signed)
Physical Therapy Treatment Patient Details Name: Brenda Morgan MRN: [id number] DOB: Feb 11, 1956 Today's Date: 07/11/2020    History of Present Illness 64 y.o. female with longstanding history of lower back and right greater than left leg pain.  Pain is severe and interferes with ADLs.  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 Pt underwent L4-5 posterior decompression and fusion on 06/28/2020. Pt now admitted due to increaed pain in low back and LEs since discharge. PMH includes:  sleep apnea, fatty liver disease, COPD, DM.    PT Comments    Pt continues to have poor recall and adherence to back precautions requiring frequent verbal cues. Pt was able to use vending machine appropriately however patient didn't remember she couldn't bend over and get the item. Pt remains to have decreased safety insight as well. Acute PT to continue to follow.    Follow Up Recommendations  SNF;Supervision/Assistance - 24 hour     Equipment Recommendations  None recommended by PT    Recommendations for Other Services       Precautions / Restrictions Precautions Precautions: Fall;Back Precaution Booklet Issued: No Precaution Comments: pt with recall of 2/3 precautions, however functional does not adhere to them Required Braces or Orthoses: Spinal Brace Spinal Brace: Lumbar corset;Applied in sitting position Restrictions Weight Bearing Restrictions: No    Mobility  Bed Mobility Overal bed mobility: Needs Assistance Bed Mobility: Rolling;Sidelying to Sit Rolling: Supervision Sidelying to sit: Min guard       General bed mobility comments: pt attempting to pull self up to long sit, max verbal cues to remind pt for back precautions and to complete log roll  Transfers Overall transfer level: Needs assistance Equipment used: Rolling walker (2 wheeled) Transfers: Sit to/from Stand Sit to Stand: Min guard         General transfer comment:  min guard for safety, PT did have to set up RW and lock its break due to patients decreased insight  Ambulation/Gait Ambulation/Gait assistance: Min guard Gait Distance (Feet): 200 Feet Assistive device: 4-wheeled walker Gait Pattern/deviations: Step-through pattern Gait velocity: dec' Gait velocity interpretation: <1.31 ft/sec, indicative of household ambulator General Gait Details: pt used tall 4WW, walker did optimize pt's upright position however pt continues to twist and rotate spine despite max verbal cues   Stairs             Wheelchair Mobility    Modified Rankin (Stroke Patients Only)       Balance Overall balance assessment: Needs assistance Sitting-balance support: No upper extremity supported;Feet supported Sitting balance-Leahy Scale: Good Sitting balance - Comments: supervision   Standing balance support: Single extremity supported;During functional activity Standing balance-Leahy Scale: Poor Standing balance comment: pt did hold onto 4WW while at vending machine                            Cognition Arousal/Alertness: Awake/alert Behavior During Therapy: WFL for tasks assessed/performed Overall Cognitive Status: Impaired/Different from baseline Area of Impairment: Safety/judgement;Memory                     Memory: Decreased recall of precautions   Safety/Judgement: Decreased awareness of safety     General Comments: pt doesn't adhere to back precautions      Exercises      General Comments General comments (skin integrity, edema, etc.): VSS      Pertinent Vitals/Pain Pain Assessment: 0-10 Pain Score:  6  Pain Location: low back Pain Descriptors / Indicators: Sore Pain Intervention(s): Monitored during session    Home Living                      Prior Function            PT Goals (current goals can now be found in the care plan section) Progress towards PT goals: Progressing toward goals     Frequency    Min 3X/week      PT Plan Current plan remains appropriate    Co-evaluation              AM-PAC PT "6 Clicks" Mobility   Outcome Measure  Help needed turning from your back to your side while in a flat bed without using bedrails?: A Little Help needed moving from lying on your back to sitting on the side of a flat bed without using bedrails?: A Little Help needed moving to and from a bed to a chair (including a wheelchair)?: A Little Help needed standing up from a chair using your arms (e.g., wheelchair or bedside chair)?: A Little Help needed to walk in hospital room?: A Little Help needed climbing 3-5 steps with a railing? : A Lot 6 Click Score: 17    End of Session Equipment Utilized During Treatment: Back brace;Gait belt Activity Tolerance: Patient tolerated treatment well Patient left: in bed;with call bell/phone within reach Nurse Communication: Mobility status PT Visit Diagnosis: Other abnormalities of gait and mobility (R26.89);Pain     Time: 6045-4098 PT Time Calculation (min) (ACUTE ONLY): 20 min  Charges:  $Gait Training: 8-22 mins                     Lewis Shock, PT, DPT Acute Rehabilitation Services Pager #: [phone] Office #: [phone]    Iona Hansen 07/11/2020, 3:02 PM

## 2020-07-11 NOTE — Discharge Summary (Addendum)
Physician Discharge Summary  Patient ID: Brenda Morgan MRN: [id number] DOB/AGE: 63-12-1955 63 y.o.  Admit date: 07/06/2020 Discharge date: 07/12/2020  Admission Diagnoses:  Post op back pain Need for SNF Gait instability  Discharge Diagnoses:  Same Active Problems:   Postoperative back pain   Lumbar radiculopathy   Discharged Condition: Stable  Hospital Course:  Devera Englander is a 64 y.o. female who presented to ED 7/14 after being seen in clinic approximately 1 week s/p L4-5 fusion with severe LBP, RLE pain and inability to care for self at home. She was admitted to the hospital for pain control, therapy and ultimately SNF placement. Also noted to have mild AKI on admission labs.  During admission, her pain greatly improved with steroids. AKI resolved with fluid resus. She worked with PT/OT and SNF was rec initially, but while waiting for approval, improved and felt comfortable going home with HH. At time of discharge, pain was well controlled, ambulating with Pt/OT, tolerating po, voiding normal. Ready for discharge.  Treatments: Surgery - none  Discharge Exam: Blood pressure (!) 120/45, pulse 61, temperature 98.2 F (36.8 C), temperature source Oral, resp. rate 18, SpO2 99 %. Awake, alert, oriented Speech fluent, appropriate CN grossly intact 5/5 BUE/BLE Wound c/d/i  Disposition: Discharge disposition: 03-Skilled Nursing Facility       Discharge Instructions    Call MD for:  difficulty breathing, headache or visual disturbances   Complete by: As directed    Call MD for:  persistant dizziness or light-headedness   Complete by: As directed    Call MD for:  redness, tenderness, or signs of infection (pain, swelling, redness, odor or green/yellow discharge around incision site)   Complete by: As directed    Call MD for:  severe uncontrolled pain   Complete by: As directed    Call MD for:  temperature >100.4   Complete by: As directed    Diet general    Complete by: As directed    Driving Restrictions   Complete by: As directed    Do not drive until given clearance.   Increase activity slowly   Complete by: As directed    Lifting restrictions   Complete by: As directed    Do not lift anything >10lbs. Avoid bending and twisting in awkward positions. Avoid bending at the back.   May shower / Bathe   Complete by: As directed    In 24 hours. Okay to wash wound with warm soapy water. Avoid scrubbing the wound. Pat dry.   Remove dressing in 24 hours   Complete by: As directed      Allergies as of 07/12/2020      Reactions   Amlodipine Anaphylaxis, Swelling, Other (See Comments)   Tongue swelling    Naltrexone Hives   Penicillins Hives, Other (See Comments)   Tolerated ceftriaxone and cefepime 06/2017 Hives      Medication List    TAKE these medications   atorvastatin 10 MG tablet Commonly known as: LIPITOR Take 5 mg by mouth daily.   baclofen 20 MG tablet Commonly known as: LIORESAL Take 20 mg by mouth 3 (three) times daily.   carvedilol 25 MG tablet Commonly known as: COREG Take 25 mg by mouth daily.   cetirizine 10 MG tablet Commonly known as: ZYRTEC Take 10 mg by mouth daily.   famotidine 40 MG tablet Commonly known as: PEPCID Take 40 mg by mouth daily.   ferrous sulfate 325 (65 FE) MG tablet Take 325 mg by mouth  daily with breakfast.   fluticasone 50 MCG/ACT nasal spray Commonly known as: FLONASE Place 1 spray into both nostrils 2 (two) times daily as needed for allergies or rhinitis.   Fluticasone-Salmeterol 250-50 MCG/DOSE Aepb Commonly known as: ADVAIR Inhale 1 puff into the lungs 2 (two) times daily.   insulin aspart protamine- aspart (70-30) 100 UNIT/ML injection Commonly known as: NOVOLOG MIX 70/30 Inject 35-55 Units into the skin 2 (two) times daily with a meal.   ipratropium-albuterol 0.5-2.5 (3) MG/3ML Soln Commonly known as: DUONEB Take 3 mLs by nebulization every 6 (six) hours as needed  (asthma).   lactulose 10 GM/15ML solution Commonly known as: CHRONULAC Take 10 g by mouth 3 (three) times daily.   lamoTRIgine 150 MG tablet Commonly known as: LAMICTAL Take 150 mg by mouth daily.   magnesium oxide 400 MG tablet Commonly known as: MAG-OX Take 400 mg by mouth daily.   methocarbamol 750 MG tablet Commonly known as: Robaxin-750 Take 1 tablet (750 mg total) by mouth 3 (three) times daily as needed for muscle spasms.   methylPREDNISolone 4 MG Tbpk tablet Commonly known as: Medrol Take according to package insert   metolazone 5 MG tablet Commonly known as: ZAROXOLYN Take 5 mg by mouth 3 (three) times a week.   OMNIVEX PO Take 1 tablet by mouth daily.   oxyCODONE 5 MG immediate release tablet Commonly known as: Oxy IR/ROXICODONE Take 1 tablet (5 mg total) by mouth every 4 (four) hours as needed for severe pain.   Ozempic (1 MG/DOSE) 2 MG/1.5ML Sopn Generic drug: Semaglutide (1 MG/DOSE) Inject 1 mg into the skin every Sunday.   pantoprazole 40 MG tablet Commonly known as: PROTONIX Take 40 mg by mouth daily.   pioglitazone 15 MG tablet Commonly known as: ACTOS Take 15 mg by mouth daily.   potassium chloride SA 20 MEQ tablet Commonly known as: KLOR-CON Take 20 mEq by mouth daily.   pramipexole 0.5 MG tablet Commonly known as: MIRAPEX Take 1 mg by mouth at bedtime.   pregabalin 200 MG capsule Commonly known as: LYRICA Take 200 mg by mouth in the morning, at noon, and at bedtime.   silver sulfADIAZINE 1 % cream Commonly known as: SILVADENE Apply 1 application topically daily as needed (leg sores).   spironolactone 50 MG tablet Commonly known as: ALDACTONE Take 50 mg by mouth daily.   Synjardy XR 25-1000 MG Tb24 Generic drug: Empagliflozin-metFORMIN HCl ER Take 1 tablet by mouth daily.   torsemide 20 MG tablet Commonly known as: DEMADEX Take 20 mg by mouth daily.   Vascepa 1 g capsule Generic drug: icosapent Ethyl Take 1 g by mouth 2  (two) times daily.   Vitamin D (Ergocalciferol) 1.25 MG (50000 UNIT) Caps capsule Commonly known as: DRISDOL Take 50,000 Units by mouth every 7 (seven) days.   Vitamin D 50 MCG (2000 UT) tablet Take 2,000 Units by mouth daily.       Follow-up Information    Lisbeth Renshaw, MD. Schedule an appointment as soon as possible for a visit in 3 week(s).   Specialty: Neurosurgery Contact information: [address] [place] [address] [phone]               Signed: Alyson Ingles 07/12/2020, 7:56 AM

## 2020-07-11 NOTE — Plan of Care (Signed)
  Problem: Education: Goal: Knowledge of General Education information will improve Description: Including pain rating scale, medication(s)/side effects and non-pharmacologic comfort measures Outcome: Progressing   Problem: Nutrition: Goal: Adequate nutrition will be maintained Outcome: Progressing   Problem: Health Behavior/Discharge Planning: Goal: Ability to manage health-related needs will improve Outcome: Progressing  Patient requesting full sugar soda, RN provided education as to why diet soda or other choices would help control blood sugar better. Patient is on metformin, novolog, actos, 70/30 insulin, and jardiance.  Problem: Activity: Goal: Risk for activity intolerance will decrease Outcome: Progressing  Patient up in room with assistance, performs ADL's with assistance.

## 2020-07-11 NOTE — Progress Notes (Signed)
  NEUROSURGERY PROGRESS NOTE   No issues overnight.  Overall, drastic improvement in pain compared to initial admission Pain in RLE now intermittent No new N/T/W Eager for d/c  EXAM:  BP 109/67 (BP Location: Right Arm)   Pulse 61   Temp 97.8 F (36.6 C) (Oral)   Resp 20   SpO2 97%   Awake, alert, oriented  Speech fluent, appropriate  CN grossly intact  5/5 BUE/BLE  Incision: c/d/i  IMPRESSION/PLAN 64 y.o. female  S/p L4-5 fusion, readmitted for pain control and SNF placement. Much improved.  Ready for SNF when bed available. D/C orders entered.

## 2020-07-11 NOTE — TOC Progression Note (Signed)
Transition of Care [facility]) - Progression Note    Patient Details  Name: [name] MRN: [id number] Date of Birth: 08-10-1956  Transition of Care [facility]) CM/SW Contact  Okey Dupre Lazaro Arms, LCSW Phone Number: 07/11/2020, 4:23 PM  Clinical Narrative:   CSW talked with [facility] with [facility] [phone]) regarding patient. They are working on Psychiatrist. Requested face sheet and additional clinicals. Faxed requested information to facility (fax number [phone]). Clinical social work will continue to follow and provide interventions services as needed.    Expected Discharge Plan: Skilled Nursing Facility Barriers to Discharge: ED Barriers Resolved (Received insurance auth)  Expected Discharge Plan and Services Expected Discharge Plan: Skilled Nursing Facility In-house Referral: Clinical Social Work Discharge Planning Services: CM Consult Post Acute Care Choice: Skilled Nursing Facility Living arrangements for the past 2 months: Mobile Home Expected Discharge Date: 07/11/20                                   Social Determinants of Health (SDOH) Interventions  No SDOH interventions requested or needed at this time.  Readmission Risk Interventions No flowsheet data found.

## 2020-07-11 NOTE — Progress Notes (Signed)
Patient ambulated well in the hall tonight and denied any difficulty.  Patient able to ambulate to the bathroom in her room with 1 assist and minimal help without any difficulty.  Patient received 5 mg Oxycodone 2x's during this shift for pain stating right leg still has some numbness and pain.

## 2020-07-11 NOTE — TOC Transition Note (Deleted)
Transition of Care [facility]) - CM/SW Discharge Note   Patient Details  Name: Brenda Morgan MRN: [id number] Date of Birth: Jan 01, 1956  Transition of Care [facility]) CM/SW Contact:  Cristobal Goldmann, LCSW Phone Number: 07/11/2020, 12:13 PM   Clinical Narrative:  Patient has been medically stable for discharge, however working on insurance authorization, as Monia Pouch denied and there was a Audiological scientist and expedited appeal. CSW advised today by Babette Relic, admissions director at [employer] that auth received and CSW received call from [place] with [employer] that patient approved: Auth number [id number] effective 7/18 - 7/22.  Brother Robinette Haines [phone]) contacted and advised of insurance authorization and today's discharge. Discharge clinicals transmitted to facility.        Final next level of care: Skilled Nursing Facility ([facility]) Barriers to Discharge: ED Barriers Resolved (Received insurance auth)   Patient Goals and CMS Choice Patient states their goals for this hospitalization and ongoing recovery are:: Patient/family agreeable to rehab, then home CMS Medicare.gov Compare Post Acute Care list provided to:: Patient Represenative (must comment) Choice offered to / list presented to : Sibling  Discharge Placement   Existing PASRR number confirmed : 06/29/20          Patient chooses bed at:  [facility]) Patient to be transferred to facility by: Non-emergency ambulance Name of family member notified: Robinette Haines - brother - [phone] Patient and family notified of of transfer: 07/11/20  Discharge Plan and Services In-house Referral: Clinical Social Work Discharge Planning Services: [employer] Consult Post Acute Care Choice: Skilled Nursing Facility                             Social Determinants of Health (SDOH) Interventions  No SDOH interventions requested or needed at discharge.   Readmission Risk Interventions No flowsheet data found.

## 2020-07-11 NOTE — TOC Progression Note (Signed)
Transition of Care [facility]) - Progression Note    Patient Details  Name: Ly Wass MRN: [id number] Date of Birth: 12/27/1955  Transition [facility]) CM/SW Contact  Okey Dupre Lazaro Arms, LCSW Phone Number: 07/11/2020, 1:03 PM  Clinical Narrative:  Talked with Garret Reddish, admissions at [facility] in [place], [place] [phone], ext. [phone]) regarding patient. Per Cherie, patient's information will be sent to Kristi at facility and initiate authorization with [facility] PPO. Silva Bandy will follow-up with CSW once authorization received. CSW's phone number confirmed and Cherie advised that patient is medically ready for discharge.     Expected Discharge Plan: Skilled Nursing Facility Barriers to Discharge: ED Barriers Resolved (Received insurance auth)  Expected Discharge Plan and Services Expected Discharge Plan: Skilled Nursing Facility In-house Referral: Clinical Social Work Discharge Planning Services: CM Consult Post Acute Care Choice: Skilled Nursing Facility Living arrangements for the past 2 months: Mobile Home Expected Discharge Date: 07/11/20                                   Social Determinants of Health (SDOH) Interventions  No SDOH interventions requested or needed at this time  Readmission Risk Interventions No flowsheet data found.

## 2020-07-12 LAB — GLUCOSE, CAPILLARY
Glucose-Capillary: 205 mg/dL — ABNORMAL HIGH (ref 70–99)
Glucose-Capillary: 127 mg/dL — ABNORMAL HIGH (ref 70–99)
Glucose-Capillary: 174 mg/dL — ABNORMAL HIGH (ref 70–99)
Glucose-Capillary: 140 mg/dL — ABNORMAL HIGH (ref 70–99)

## 2020-07-12 LAB — SARS CORONAVIRUS 2 (TAT 6-24 HRS): SARS Coronavirus 2: NEGATIVE

## 2020-07-12 NOTE — TOC Progression Note (Signed)
Transition of Care (TOC) - Progression Note  Donn Pierini RN,BSN Transitions of Care Unit 4NP (non trauma) - RN Case Manager See Treatment Team for direct Phone #     Patient Details  Name: Brenda Morgan MRN: [id number] Date of Birth: 03-Jul-1956  Transition of Care [facility]) CM/SW Contact  Zenda Alpers Lenn Sink, RN Phone Number: 07/12/2020, 4:42 PM  Clinical Narrative:    Called regarding transition plan for this pt SNF vs home with [facility]. Pt has been awaiting insurance auth for [employer]- per [employer] has not been received as of this afternoon- per SNF they are hopeful that they will receive auth by tomorrow and can accept pt once Berkley Harvey has been received. Patient was under the impression that she was ready for discharge and called her husband who took off work to come transport pt. Patient now upset that she is not ready to go to SNF due to [place]. Spoke with pt and spouse at the bedside to explain situation. Pt is tearful and states she wants to just go home with [facility] services. Per pt and spouse she has no DME needs- on last discharge was set up with [facility] for HHPT/OT- list provided for choice Per CMS guidelines from medicare.gov website with star ratings (copy placed in shadow chart)- pt and spouse ok with continued use of [facility]. Bedside RN to call attending team to alert them of pt's choice to return home.  Call made to [facility] - spoke with [place]- who states they can re-accept referral with new orders- and do a start of care either tomorrow or Thursday pending on when orders are received.   1630- per bedside RN attending team not comfortable with d/c home today and would like pt to stay the night to see if SNF will receive auth tomorrow from insurance- pt and spouse have been informed and have agreed to stay the night.  TOC will f/u in am with [facility] to see if they have received auth. Will also call [facility] to update on transition plan.    Expected Discharge Plan: Skilled  Nursing Facility Barriers to Discharge: ED Barriers Resolved (Received insurance auth)  Expected Discharge Plan and Services Expected Discharge Plan: Skilled Nursing Facility In-house Referral: Clinical Social Work Discharge Planning Services: CM Consult Post Acute Care Choice: Skilled Nursing Facility Living arrangements for the past 2 months: Mobile Home Expected Discharge Date: 07/12/20                                     Social Determinants of Health (SDOH) Interventions    Readmission Risk Interventions No flowsheet data found.

## 2020-07-12 NOTE — Progress Notes (Signed)
  NEUROSURGERY PROGRESS NOTE   No issues overnight.  Awaiting insurance approval for SNF No concerns this am  EXAM:  BP (!) 120/45 (BP Location: Right Arm)   Pulse 61   Temp 98.2 F (36.8 C) (Oral)   Resp 18   SpO2 99%   Awake, alert, oriented  Speech fluent, appropriate  CN grossly intact  MAEW Incision: c/d/i  IMPRESSION/PLAN 64 y.o. female s/p L4-5 fusion, readmitted for pain control and SNF placement. Doing well. Awaiting insurance approval for SNF.

## 2020-07-12 NOTE — Progress Notes (Signed)
See TOC CM/SW Silva Bandy Webster's note regarding the miscommunication today about her discharge/insurance authorization. Provider notified and elected not to discharge to home today. Will discuss with patient and husband tomorrow. Gabriel Cirri RN

## 2020-07-12 NOTE — Care Management Important Message (Signed)
Important Message  Patient Details  Name: Brenda Morgan MRN: [id number] Date of Birth: 05/03/1956   Medicare Important Message Given:  Yes     Dorena Bodo 07/12/2020, 2:37 PM

## 2020-07-12 NOTE — TOC Progression Note (Signed)
Transition of Care [facility]) - Progression Note    Patient Details  Name: Brenda Morgan MRN: [id number] Date of Birth: 04/04/1956  Transition of Care [facility]) CM/SW Contact  Nonda Lou, [place] Phone Number: 07/12/2020, 12:33 PM  Clinical Narrative:    Insurance authorization still needed. CSW contacted by [facility] requesting face sheet for patient be resent. CSW Erie Noe re-faxed face sheet to SNF.    Expected Discharge Plan: Skilled Nursing Facility Barriers to Discharge: ED Barriers Resolved (Received insurance auth)  Expected Discharge Plan and Services Expected Discharge Plan: Skilled Nursing Facility In-house Referral: Clinical Social Work Discharge Planning Services: CM Consult Post Acute Care Choice: Skilled Nursing Facility Living arrangements for the past 2 months: Mobile Home Expected Discharge Date: 07/12/20                                     Social Determinants of Health (SDOH) Interventions    Readmission Risk Interventions No flowsheet data found.

## 2020-07-13 LAB — GLUCOSE, CAPILLARY: Glucose-Capillary: 178 mg/dL — ABNORMAL HIGH (ref 70–99)

## 2020-07-13 NOTE — Progress Notes (Signed)
Pt and Pt's husband given D/C education and all questions answered. IV already removed. No equipment to deliver, printed prescription given to Pt. Pt taken to car with all belongings.

## 2020-07-13 NOTE — Progress Notes (Signed)
°  NEUROSURGERY PROGRESS NOTE   No issues overnight. Pain manageable with po medications Does not want to go to SNF anymore - feels comfortable going home with HH  EXAM:  BP 126/62 (BP Location: Right Arm)    Pulse (!) 59    Temp 98.9 F (37.2 C) (Oral)    Resp 14    SpO2 100%   Awake, alert, oriented  Speech fluent, appropriate  CN grossly intact  MAEW Incision: c/d/i  IMPRESSION/PLAN 64 y.o. female s/p L4-5 fusion, readmitted for pain control and SNF placement. Doing well. Feels comfortable going home now vs. SNF. D/C orders entered

## 2020-07-13 NOTE — TOC Transition Note (Signed)
Transition of Care (TOC) - CM/SW Discharge Note Donn Pierini RN,BSN Transitions of Care Unit 4NP (non trauma) - RN Case Manager See Treatment Team for direct Phone #   Patient Details  Name: Brenda Morgan MRN: [id number] Date of Birth: 24-Jul-1956  Transition of Care [facility]) CM/SW Contact:  Darrold Span, RN Phone Number: 07/13/2020, 10:14 AM   Clinical Narrative:    Per attending team pt has been cleared to return home with [facility]- orders have been placed for HHPT/OT- pt is declining SNF placement at this time and does not want to wait any longer for insurance auth for STSNF and prefers to return home with [facility] and spouse.  Call made to [facility] [phone]) and spoke with Elliott- they will try to start services by the end of this week- she will return call with a start of care date. Orders and d/c summary faxed via epic to [facility] to fax # [phone].  Pt to transport home with spouse.    Final next level of care: Home w Home Health Services Barriers to Discharge: Barriers Resolved   Patient Goals and CMS Choice Patient states their goals for this hospitalization and ongoing recovery are:: Patient/family agreeable to rehab, then home CMS Medicare.gov Compare Post Acute Care list provided to:: Patient Represenative (must comment) Choice offered to / list presented to : Sibling  Discharge Placement   Existing PASRR number confirmed : 06/29/20          Patient chooses bed at:  ([facility])--- home with [facility] Patient to be transferred to facility by: Non-emergency ambulance-- via private car Name of family member notified: Robinette Haines - brother - [phone] Patient and family notified of of transfer: 07/11/20  Discharge Plan and Services In-house Referral: Clinical Social Work Discharge Planning Services: [employer] Consult Post Acute Care Choice: Home Health          DME Arranged: N/A DME Agency: NA       HH Arranged: PT, OT HH Agency: [facility] Date  HH Agency Contacted: 07/13/20 Time HH Agency Contacted: 0900 Representative spoke with at [facility] Agency: [place]  Social Determinants of Health (SDOH) Interventions     Readmission Risk Interventions No flowsheet data found.

## 2020-07-13 NOTE — Progress Notes (Addendum)
Discussed with OTA, agree with change of disposition.   Nilsa Nutting., OTR/L Acute Rehabilitation Services Pager [phone] Office [phone]   Occupational Therapy Treatment Patient Details Name: Brenda Morgan MRN: [id number] DOB: 06/23/1956 Today's Date: 07/13/2020    History of present illness 64 y.o. female with longstanding history of lower back and right greater than left leg pain.  Pain is severe and interferes with ADLs.  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 Pt underwent L4-5 posterior decompression and fusion on 06/28/2020. Pt now admitted due to increaed pain in low back and LEs since discharge. PMH includes:  sleep apnea, fatty liver disease, COPD, DM.   OT comments  Pt making steady progress towards OT goals this session. Pt found sitting EOB already dressed with brace donned and husband present. Pt able to state all precautions and is able to demo adherence to precautions functionally. Pt completed community distance functional mobility with rollator and min guard assist. Noted pt denied SNF. Feel pt is appropriate for [place] with intermittent supervision. Will let OTR know about change in POC. WIll continue to follow acutely per POC.    Follow Up Recommendations  Home health OT;Supervision - Intermittent    Equipment Recommendations  None recommended by OT;Other (comment) (DME needs are met)    Recommendations for Other Services      Precautions / Restrictions Precautions Precautions: Fall;Back Precaution Booklet Issued: No Precaution Comments: able to recall 3/3 precautions Required Braces or Orthoses: Spinal Brace Spinal Brace: Lumbar corset;Applied in sitting position Restrictions Weight Bearing Restrictions: No       Mobility Bed Mobility               General bed mobility comments: sitting EOB upon arrival  Transfers Overall transfer level: Needs assistance Equipment used: 4-wheeled  walker Transfers: Sit to/from Stand Sit to Stand: Min guard         General transfer comment: min guard for safety    Balance Overall balance assessment: Needs assistance Sitting-balance support: No upper extremity supported;Feet supported Sitting balance-Leahy Scale: Good     Standing balance support: Bilateral upper extremity supported Standing balance-Leahy Scale: Poor Standing balance comment: reliant on BUE support during mobility                           ADL either performed or assessed with clinical judgement   ADL Overall ADL's : Needs assistance/impaired       Grooming Details (indicate cue type and reason): education on 2 cup method for oral care and using wash cloth to wash face vs bending forward into sink       Lower Body Bathing Details (indicate cue type and reason): pt reports LH sponge at home for LB bathing Upper Body Dressing : Minimal assistance;Sitting Upper Body Dressing Details (indicate cue type and reason): to don brace EOB Lower Body Dressing: Moderate assistance;Sit to/from stand Lower Body Dressing Details (indicate cue type and reason): education and demo of using reacher for LB dressing, pt able to verbalize understanding Toilet Transfer: Min guard;Ambulation (rollator) Toilet Transfer Details (indicate cue type and reason): simulated via functional mobility; minguard for safety with rollator   Toileting - Clothing Manipulation Details (indicate cue type and reason): education on maintaining back precautions during pericare, husband reports installing badea   Tub/Shower Transfer Details (indicate cue type and reason): pt reports walkin shower with seat Functional mobility during ADLs: Min guard (rollator) General  ADL Comments: improved activity tolerance, better recall of precautions     Vision       Perception     Praxis      Cognition Arousal/Alertness: Awake/alert Behavior During Therapy: WFL for tasks  assessed/performed Overall Cognitive Status: Within Functional Limits for tasks assessed                                 General Comments: overall WFL for simple mobility tasks        Exercises     Shoulder Instructions       General Comments pts husband present during session reporting he works during the day but plans to leave out meals for pt while he is at work. education provided on hooking grabber to rollator.    Pertinent Vitals/ Pain       Pain Assessment: Faces Faces Pain Scale: Hurts a little bit Pain Location: low back Pain Descriptors / Indicators: Tightness Pain Intervention(s): Monitored during session  Home Living                                          Prior Functioning/Environment              Frequency  Min 2X/week        Progress Toward Goals  OT Goals(current goals can now be found in the care plan section)  Progress towards OT goals: Progressing toward goals  Acute Rehab OT Goals Patient Stated Goal: to go to rehab OT Goal Formulation: With patient Time For Goal Achievement: 07/21/20 Potential to Achieve Goals: Good  Plan Discharge plan needs to be updated;Frequency needs to be updated    Co-evaluation                 AM-PAC OT "6 Clicks" Daily Activity     Outcome Measure   Help from another person eating meals?: None Help from another person taking care of personal grooming?: A Little Help from another person toileting, which includes using toliet, bedpan, or urinal?: A Little Help from another person bathing (including washing, rinsing, drying)?: A Little Help from another person to put on and taking off regular upper body clothing?: A Little Help from another person to put on and taking off regular lower body clothing?: A Little 6 Click Score: 19    End of Session Equipment Utilized During Treatment: Rolling walker;Back brace  OT Visit Diagnosis: Unsteadiness on feet (R26.81);Pain    Activity Tolerance Patient tolerated treatment well   Patient Left in bed;with call bell/phone within reach;with nursing/sitter in room;with family/visitor present;Other (comment) (sitting EOB)   Nurse Communication Mobility status        Time: 9735-3299 OT Time Calculation (min): 30 min  Charges: OT General Charges $OT Visit: 1 Visit OT Treatments $Self Care/Home Management : 23-37 mins  Lanier Clam., COTA/L Acute Rehabilitation Services [phone]    Ihor Gully 07/13/2020, 8:50 AM

## 2020-07-23 ENCOUNTER — Other Ambulatory Visit: Payer: Self-pay

## 2020-07-23 ENCOUNTER — Ambulatory Visit (HOSPITAL_COMMUNITY)
Admission: EM | Admit: 2020-07-23 | Discharge: 2020-07-23 | Disposition: A | Discharge status: Home / Self Care | Payer: Medicare FFS | Attending: Urgent Care | Admitting: Urgent Care

## 2020-07-23 ENCOUNTER — Encounter (HOSPITAL_COMMUNITY): Payer: Self-pay

## 2020-07-23 DIAGNOSIS — M545 Low back pain, unspecified: Secondary | ICD-10-CM

## 2020-07-23 DIAGNOSIS — Z9889 Other specified postprocedural states: Secondary | ICD-10-CM

## 2020-07-23 DIAGNOSIS — T8149XA Infection following a procedure, other surgical site, initial encounter: Secondary | ICD-10-CM

## 2020-07-23 MED ORDER — LIDOCAINE HCL (PF) 1 % IJ SOLN
INTRAMUSCULAR | Status: AC
  Filled 2020-07-23: qty 4

## 2020-07-23 MED ORDER — CEFTRIAXONE SODIUM 1 G IJ SOLR
INTRAMUSCULAR | Status: AC
  Filled 2020-07-23: qty 10

## 2020-07-23 MED ORDER — DOXYCYCLINE HYCLATE 100 MG PO CAPS: 100.0000 mg | ORAL_CAPSULE | Freq: Two times a day (BID) | ORAL | 0 refills | Status: DC

## 2020-07-23 MED ORDER — CEFTRIAXONE SODIUM 1 G IJ SOLR
1.0000 g | Freq: Once | INTRAMUSCULAR | Status: AC
  Administered 2020-07-23: 1 g via INTRAMUSCULAR

## 2020-07-23 NOTE — ED Provider Notes (Signed)
[facility]   MRN: [id number] DOB: August 21, 1956  Subjective:   Brenda Morgan is a 64 y.o. female presenting for wound check on her low back.  Patient had a posterior lumbar interbody fusion at L4-L5 on 0 06/28/2020.  Since then, patient has had multiple hospital visits, had a kidney injury and admission for postoperative back pain.  She has since traveled to [place] by car and returned.  Has been using her opioid pain medication consistently for pain relief.  Unfortunately in the past few days that she was driving back from [place], started to feel more pain and so more drainage and a black spot over the incision.  Has been changing the dressings as instructed.  Denies fever, vomiting, belly pain.   Current Facility-Administered Medications:  .  cefTRIAXone (ROCEPHIN) injection 1 g, 1 g, Intramuscular, Once, Wallis Bamberg, PA-C  Current Outpatient Medications:  .  atorvastatin (LIPITOR) 10 MG tablet, Take 5 mg by mouth daily., Disp: , Rfl:  .  baclofen (LIORESAL) 20 MG tablet, Take 20 mg by mouth 3 (three) times daily., Disp: , Rfl:  .  carvedilol (COREG) 25 MG tablet, Take 25 mg by mouth daily., Disp: , Rfl:  .  cetirizine (ZYRTEC) 10 MG tablet, Take 10 mg by mouth daily., Disp: , Rfl:  .  Cholecalciferol (VITAMIN D) 50 MCG (2000 UT) tablet, Take 2,000 Units by mouth daily., Disp: , Rfl:  .  Dietary Management Product (OMNIVEX PO), Take 1 tablet by mouth daily., Disp: , Rfl:  .  doxycycline (VIBRAMYCIN) 100 MG capsule, Take 1 capsule (100 mg total) by mouth 2 (two) times daily., Disp: 20 capsule, Rfl: 0 .  Empagliflozin-metFORMIN HCl ER (SYNJARDY XR) 25-1000 MG TB24, Take 1 tablet by mouth daily., Disp: , Rfl:  .  famotidine (PEPCID) 40 MG tablet, Take 40 mg by mouth daily., Disp: , Rfl:  .  ferrous sulfate 325 (65 FE) MG tablet, Take 325 mg by mouth daily with breakfast., Disp: , Rfl:  .  fluticasone (FLONASE) 50 MCG/ACT nasal spray, Place 1 spray into both nostrils 2 (two) times  daily as needed for allergies or rhinitis., Disp: , Rfl:  .  Fluticasone-Salmeterol (ADVAIR) 250-50 MCG/DOSE AEPB, Inhale 1 puff into the lungs 2 (two) times daily., Disp: , Rfl:  .  icosapent Ethyl (VASCEPA) 1 g capsule, Take 1 g by mouth 2 (two) times daily., Disp: , Rfl:  .  insulin aspart protamine- aspart (NOVOLOG MIX 70/30) (70-30) 100 UNIT/ML injection, Inject 35-55 Units into the skin 2 (two) times daily with a meal., Disp: , Rfl:  .  ipratropium-albuterol (DUONEB) 0.5-2.5 (3) MG/3ML SOLN, Take 3 mLs by nebulization every 6 (six) hours as needed (asthma)., Disp: , Rfl:  .  lactulose (CHRONULAC) 10 GM/15ML solution, Take 10 g by mouth 3 (three) times daily., Disp: , Rfl:  .  lamoTRIgine (LAMICTAL) 150 MG tablet, Take 150 mg by mouth daily., Disp: , Rfl:  .  magnesium oxide (MAG-OX) 400 MG tablet, Take 400 mg by mouth daily., Disp: , Rfl:  .  methocarbamol (ROBAXIN-750) 750 MG tablet, Take 1 tablet (750 mg total) by mouth 3 (three) times daily as needed for muscle spasms., Disp: 90 tablet, Rfl: 1 .  methylPREDNISolone (MEDROL) 4 MG TBPK tablet, Take according to package insert, Disp: 1 each, Rfl: 0 .  metolazone (ZAROXOLYN) 5 MG tablet, Take 5 mg by mouth 3 (three) times a week., Disp: , Rfl:  .  oxyCODONE (OXY IR/ROXICODONE) 5 MG immediate release tablet,  Take 1 tablet (5 mg total) by mouth every 4 (four) hours as needed for severe pain., Disp: 60 tablet, Rfl: 0 .  pantoprazole (PROTONIX) 40 MG tablet, Take 40 mg by mouth daily., Disp: , Rfl:  .  pioglitazone (ACTOS) 15 MG tablet, Take 15 mg by mouth daily., Disp: , Rfl:  .  potassium chloride SA (KLOR-CON) 20 MEQ tablet, Take 20 mEq by mouth daily., Disp: , Rfl:  .  pramipexole (MIRAPEX) 0.5 MG tablet, Take 1 mg by mouth at bedtime., Disp: , Rfl:  .  pregabalin (LYRICA) 200 MG capsule, Take 200 mg by mouth in the morning, at noon, and at bedtime., Disp: , Rfl:  .  Semaglutide, 1 MG/DOSE, (OZEMPIC, 1 MG/DOSE,) 2 MG/1.5ML SOPN, Inject 1 mg  into the skin every Sunday., Disp: , Rfl:  .  silver sulfADIAZINE (SILVADENE) 1 % cream, Apply 1 application topically daily as needed (leg sores). , Disp: , Rfl:  .  spironolactone (ALDACTONE) 50 MG tablet, Take 50 mg by mouth daily., Disp: , Rfl:  .  torsemide (DEMADEX) 20 MG tablet, Take 20 mg by mouth daily., Disp: , Rfl:  .  Vitamin D, Ergocalciferol, (DRISDOL) 1.25 MG (50000 UNIT) CAPS capsule, Take 50,000 Units by mouth every 7 (seven) days., Disp: , Rfl:    Allergies  Allergen Reactions  . Amlodipine Anaphylaxis, Swelling and Other (See Comments)    Tongue swelling   . Naltrexone Hives  . Penicillins Hives and Other (See Comments)    Tolerated ceftriaxone and cefepime 06/2017 Hives      Past Medical History:  Diagnosis Date  . COPD (chronic obstructive pulmonary disease) (HCC)   . Diabetes mellitus without complication (HCC)   . Fatty liver 2012   per pt 06/24/20  . Sleep apnea      Past Surgical History:  Procedure Laterality Date  . BACK SURGERY    . carotid artery surgery Right 10/07/2012  . carotid ultrasound  06/2015  . CHOLECYSTECTOMY  1981  . COLONOSCOPY  2014  . DIAGNOSTIC MAMMOGRAM  03/2017  . groin lypnoid Left 03/2015  . right heel spur  1994  . TUBAL LIGATION  1985  . wrist trigger release Right 01/2016    History reviewed. No pertinent family history.  Social History   Tobacco Use  . Smoking status: Current Every Day Smoker    Packs/day: 0.50    Types: Cigarettes  . Smokeless tobacco: Never Used  Substance Use Topics  . Alcohol use: Not Currently  . Drug use: Not Currently    ROS   Objective:   Vitals: BP (!) 130/68 (BP Location: Right Arm)   Pulse 80   Temp 98 F (36.7 C) (Oral)   Resp 16   Ht 5\' 6"  (1.676 m)   Wt 200 lb (90.7 kg)   SpO2 94%   BMI 32.28 kg/m   Physical Exam Constitutional:      General: She is not in acute distress.    Appearance: Normal appearance. She is well-developed. She is not ill-appearing,  toxic-appearing or diaphoretic.  HENT:     Head: Normocephalic and atraumatic.     Nose: Nose normal.     Mouth/Throat:     Mouth: Mucous membranes are moist.     Pharynx: Oropharynx is clear.  Eyes:     General: No scleral icterus.       Right eye: No discharge.        Left eye: No discharge.     Extraocular  Movements: Extraocular movements intact.     Conjunctiva/sclera: Conjunctivae normal.     Pupils: Pupils are equal, round, and reactive to light.  Cardiovascular:     Rate and Rhythm: Normal rate.  Pulmonary:     Effort: Pulmonary effort is normal.  Musculoskeletal:       Back:  Skin:    General: Skin is warm and dry.  Neurological:     General: No focal deficit present.     Mental Status: She is alert and oriented to person, place, and time.  Psychiatric:        Mood and Affect: Mood normal.        Behavior: Behavior normal.        Thought Content: Thought content normal.        Judgment: Judgment normal.           Patient given IM ceftriaxone in clinic.  Per chart review she has tolerated this in the past.  Assessment and Plan :   PDMP not reviewed this encounter.  1. Postoperative wound infection   2. Acute low back pain without sciatica, unspecified back pain laterality   3. History of back surgery     Case reviewed and patient examined with Dr. Tracie Harrier.  Given stable vital signs and pain tolerance, will trial patient on doxycycline status post IM ceftriaxone in clinic.  Emphasized strict ER precautions, urgent follow-up with back surgeon otherwise on Monday. Counseled patient on potential for adverse effects with medications prescribed today, patient verbalized understanding.    Wallis Bamberg, PA-C 07/23/20 1708

## 2020-07-23 NOTE — ED Triage Notes (Signed)
Pt states had back surgery July 6th and drove to [place] last wk and was wearing a back brace and pt states the back brace rubbed her incision and now there is dark black drainage coming from incision. Pt c/o pain at incision site.

## 2020-07-23 NOTE — Discharge Instructions (Signed)
If you develop worsening pain, more drainage, fever, vomiting, belly pain then please report to the emergency room.  Otherwise make sure you are scheduling your pain medication, start doxycycline for postoperative wound infection.  Make absolutely sure that you see your back surgeon on Monday for an office visit on your wound.

## 2020-07-24 DIAGNOSIS — Z981 Arthrodesis status: Secondary | ICD-10-CM

## 2020-07-31 ENCOUNTER — Inpatient Hospital Stay (HOSPITAL_COMMUNITY): Payer: BC Managed Care – PPO

## 2020-07-31 ENCOUNTER — Inpatient Hospital Stay (HOSPITAL_COMMUNITY)
Admission: EM | Admit: 2020-07-31 | Discharge: 2020-08-05 | DRG: 463 | Disposition: A | Discharge status: Home Health | Payer: BC Managed Care – PPO | Attending: Family Medicine | Admitting: Family Medicine

## 2020-07-31 ENCOUNTER — Other Ambulatory Visit: Payer: Self-pay

## 2020-07-31 ENCOUNTER — Encounter (HOSPITAL_COMMUNITY): Payer: Self-pay | Admitting: Emergency Medicine

## 2020-07-31 DIAGNOSIS — M549 Dorsalgia, unspecified: Secondary | ICD-10-CM | POA: Diagnosis present

## 2020-07-31 DIAGNOSIS — Z888 Allergy status to other drugs, medicaments and biological substances status: Secondary | ICD-10-CM | POA: Diagnosis not present

## 2020-07-31 DIAGNOSIS — J449 Chronic obstructive pulmonary disease, unspecified: Secondary | ICD-10-CM

## 2020-07-31 DIAGNOSIS — R110 Nausea: Secondary | ICD-10-CM | POA: Diagnosis not present

## 2020-07-31 DIAGNOSIS — I152 Hypertension secondary to endocrine disorders: Secondary | ICD-10-CM

## 2020-07-31 DIAGNOSIS — F17210 Nicotine dependence, cigarettes, uncomplicated: Secondary | ICD-10-CM | POA: Diagnosis present

## 2020-07-31 DIAGNOSIS — Z794 Long term (current) use of insulin: Secondary | ICD-10-CM

## 2020-07-31 DIAGNOSIS — M545 Low back pain: Secondary | ICD-10-CM | POA: Diagnosis present

## 2020-07-31 DIAGNOSIS — Z9851 Tubal ligation status: Secondary | ICD-10-CM | POA: Diagnosis not present

## 2020-07-31 DIAGNOSIS — Z981 Arthrodesis status: Secondary | ICD-10-CM | POA: Diagnosis not present

## 2020-07-31 DIAGNOSIS — G4730 Sleep apnea, unspecified: Secondary | ICD-10-CM | POA: Diagnosis present

## 2020-07-31 DIAGNOSIS — E785 Hyperlipidemia, unspecified: Secondary | ICD-10-CM | POA: Diagnosis present

## 2020-07-31 DIAGNOSIS — Z880 Allergy status to penicillin: Secondary | ICD-10-CM

## 2020-07-31 DIAGNOSIS — Z20822 Contact with and (suspected) exposure to covid-19: Secondary | ICD-10-CM | POA: Diagnosis present

## 2020-07-31 DIAGNOSIS — T847XXA Infection and inflammatory reaction due to other internal orthopedic prosthetic devices, implants and grafts, initial encounter: Secondary | ICD-10-CM

## 2020-07-31 DIAGNOSIS — I10 Essential (primary) hypertension: Secondary | ICD-10-CM

## 2020-07-31 DIAGNOSIS — I129 Hypertensive chronic kidney disease with stage 1 through stage 4 chronic kidney disease, or unspecified chronic kidney disease: Secondary | ICD-10-CM | POA: Diagnosis present

## 2020-07-31 DIAGNOSIS — N1831 Chronic kidney disease, stage 3a: Secondary | ICD-10-CM | POA: Diagnosis present

## 2020-07-31 DIAGNOSIS — T8463XA Infection and inflammatory reaction due to internal fixation device of spine, initial encounter: Secondary | ICD-10-CM | POA: Diagnosis present

## 2020-07-31 DIAGNOSIS — E1122 Type 2 diabetes mellitus with diabetic chronic kidney disease: Secondary | ICD-10-CM

## 2020-07-31 DIAGNOSIS — Y838 Other surgical procedures as the cause of abnormal reaction of the patient, or of later complication, without mention of misadventure at the time of the procedure: Secondary | ICD-10-CM | POA: Diagnosis present

## 2020-07-31 DIAGNOSIS — T8149XA Infection following a procedure, other surgical site, initial encounter: Secondary | ICD-10-CM | POA: Diagnosis present

## 2020-07-31 DIAGNOSIS — Z79899 Other long term (current) drug therapy: Secondary | ICD-10-CM

## 2020-07-31 DIAGNOSIS — Z9049 Acquired absence of other specified parts of digestive tract: Secondary | ICD-10-CM

## 2020-07-31 DIAGNOSIS — K760 Fatty (change of) liver, not elsewhere classified: Secondary | ICD-10-CM | POA: Diagnosis present

## 2020-07-31 DIAGNOSIS — R0602 Shortness of breath: Secondary | ICD-10-CM

## 2020-07-31 DIAGNOSIS — A419 Sepsis, unspecified organism: Secondary | ICD-10-CM | POA: Diagnosis present

## 2020-07-31 DIAGNOSIS — G9341 Metabolic encephalopathy: Secondary | ICD-10-CM | POA: Diagnosis present

## 2020-07-31 DIAGNOSIS — M4626 Osteomyelitis of vertebra, lumbar region: Secondary | ICD-10-CM

## 2020-07-31 DIAGNOSIS — G8918 Other acute postprocedural pain: Secondary | ICD-10-CM | POA: Diagnosis present

## 2020-07-31 DIAGNOSIS — E119 Type 2 diabetes mellitus without complications: Secondary | ICD-10-CM

## 2020-07-31 HISTORY — DX: Infection following a procedure, other surgical site, initial encounter: T81.49XA

## 2020-07-31 LAB — CBC WITH DIFFERENTIAL/PLATELET
Abs Immature Granulocytes: 0.17 10*3/uL — ABNORMAL HIGH (ref 0.00–0.07)
Basophils Absolute: 0.1 10*3/uL (ref 0.0–0.1)
Basophils Relative: 0 %
Eosinophils Absolute: 0.1 10*3/uL (ref 0.0–0.5)
Eosinophils Relative: 0 %
HCT: 43 % (ref 36.0–46.0)
Hemoglobin: 13.3 g/dL (ref 12.0–15.0)
Immature Granulocytes: 1 %
Lymphocytes Relative: 16 %
Lymphs Abs: 3.1 10*3/uL (ref 0.7–4.0)
MCH: 29.1 pg (ref 26.0–34.0)
MCHC: 30.9 g/dL (ref 30.0–36.0)
MCV: 94.1 fL (ref 80.0–100.0)
Monocytes Absolute: 1.2 10*3/uL — ABNORMAL HIGH (ref 0.1–1.0)
Monocytes Relative: 6 %
Neutro Abs: 14.4 10*3/uL — ABNORMAL HIGH (ref 1.7–7.7)
Neutrophils Relative %: 77 %
Platelets: 256 10*3/uL (ref 150–400)
RBC: 4.57 MIL/uL (ref 3.87–5.11)
RDW: 15.3 % (ref 11.5–15.5)
WBC: 19 10*3/uL — ABNORMAL HIGH (ref 4.0–10.5)
nRBC: 0 % (ref 0.0–0.2)

## 2020-07-31 LAB — CBG MONITORING, ED: Glucose-Capillary: 121 mg/dL — ABNORMAL HIGH (ref 70–99)

## 2020-07-31 LAB — COMPREHENSIVE METABOLIC PANEL
ALT: 13 U/L (ref 0–44)
AST: 15 U/L (ref 15–41)
Albumin: 3.3 g/dL — ABNORMAL LOW (ref 3.5–5.0)
Alkaline Phosphatase: 131 U/L — ABNORMAL HIGH (ref 38–126)
Anion gap: 16 — ABNORMAL HIGH (ref 5–15)
BUN: 24 mg/dL — ABNORMAL HIGH (ref 8–23)
CO2: 29 mmol/L (ref 22–32)
Calcium: 9.8 mg/dL (ref 8.9–10.3)
Chloride: 95 mmol/L — ABNORMAL LOW (ref 98–111)
Creatinine, Ser: 1.17 mg/dL — ABNORMAL HIGH (ref 0.44–1.00)
GFR calc Af Amer: 58 mL/min — ABNORMAL LOW (ref >60)
GFR calc non Af Amer: 50 mL/min — ABNORMAL LOW (ref >60)
Glucose, Bld: 163 mg/dL — ABNORMAL HIGH (ref 70–99)
Potassium: 3.7 mmol/L (ref 3.5–5.1)
Sodium: 140 mmol/L (ref 135–145)
Total Bilirubin: 0.5 mg/dL (ref 0.3–1.2)
Total Protein: 7.5 g/dL (ref 6.5–8.1)

## 2020-07-31 LAB — SEDIMENTATION RATE: Sed Rate: 55 mm/hr — ABNORMAL HIGH (ref 0–22)

## 2020-07-31 LAB — LACTIC ACID, PLASMA
Lactic Acid, Venous: 2.6 mmol/L (ref 0.5–1.9)
Lactic Acid, Venous: 1.5 mmol/L (ref 0.5–1.9)
Lactic Acid, Venous: 1.4 mmol/L (ref 0.5–1.9)

## 2020-07-31 LAB — SARS CORONAVIRUS 2 BY RT PCR (HOSPITAL ORDER, PERFORMED IN ~~LOC~~ HOSPITAL LAB): SARS Coronavirus 2: NEGATIVE

## 2020-07-31 LAB — C-REACTIVE PROTEIN: CRP: 12.8 mg/dL — ABNORMAL HIGH (ref <1.0)

## 2020-07-31 MED ORDER — SODIUM CHLORIDE 0.9% FLUSH
3.0000 mL | Freq: Once | INTRAVENOUS | Status: AC
  Administered 2020-07-31: 3 mL via INTRAVENOUS

## 2020-07-31 MED ORDER — SODIUM CHLORIDE 0.9% FLUSH: 3.0000 mL | Freq: Once | INTRAVENOUS | Status: DC

## 2020-07-31 MED ORDER — LACTATED RINGERS IV BOLUS (SEPSIS)
1000.0000 mL | Freq: Once | INTRAVENOUS | Status: AC
  Administered 2020-07-31: 1000 mL via INTRAVENOUS

## 2020-07-31 MED ORDER — ONDANSETRON HCL 4 MG PO TABS: 4.0000 mg | ORAL_TABLET | Freq: Four times a day (QID) | ORAL | Status: DC | PRN

## 2020-07-31 MED ORDER — VANCOMYCIN HCL 750 MG/150ML IV SOLN
750.0000 mg | Freq: Two times a day (BID) | INTRAVENOUS | Status: DC
  Administered 2020-08-01: 750 mg via INTRAVENOUS
  Filled 2020-07-31 (×2): qty 150

## 2020-07-31 MED ORDER — ENSURE ENLIVE PO LIQD
237.0000 mL | Freq: Two times a day (BID) | ORAL | Status: DC
  Administered 2020-08-01 – 2020-08-05 (×3): 237 mL via ORAL

## 2020-07-31 MED ORDER — SODIUM CHLORIDE 0.9% FLUSH
3.0000 mL | Freq: Two times a day (BID) | INTRAVENOUS | Status: DC
  Administered 2020-07-31 – 2020-08-05 (×7): 3 mL via INTRAVENOUS

## 2020-07-31 MED ORDER — ONDANSETRON HCL 4 MG/2ML IJ SOLN
4.0000 mg | Freq: Four times a day (QID) | INTRAMUSCULAR | Status: DC | PRN
  Administered 2020-08-04 – 2020-08-05 (×2): 4 mg via INTRAVENOUS
  Filled 2020-07-31 (×2): qty 2

## 2020-07-31 MED ORDER — VANCOMYCIN HCL IN DEXTROSE 1-5 GM/200ML-% IV SOLN
1000.0000 mg | Freq: Once | INTRAVENOUS | Status: AC
  Administered 2020-07-31: 1000 mg via INTRAVENOUS
  Filled 2020-07-31: qty 200

## 2020-07-31 MED ORDER — SODIUM CHLORIDE 0.9 % IV BOLUS
500.0000 mL | Freq: Once | INTRAVENOUS | Status: AC
  Administered 2020-07-31: 500 mL via INTRAVENOUS

## 2020-07-31 MED ORDER — VANCOMYCIN HCL 500 MG/100ML IV SOLN
500.0000 mg | Freq: Once | INTRAVENOUS | Status: AC
  Administered 2020-07-31: 500 mg via INTRAVENOUS
  Filled 2020-07-31: qty 100

## 2020-07-31 MED ORDER — ACETAMINOPHEN 325 MG PO TABS: 650.0000 mg | ORAL_TABLET | Freq: Four times a day (QID) | ORAL | Status: DC | PRN

## 2020-07-31 MED ORDER — ACETAMINOPHEN 650 MG RE SUPP: 650.0000 mg | Freq: Four times a day (QID) | RECTAL | Status: DC | PRN

## 2020-07-31 MED ORDER — ALBUTEROL SULFATE (2.5 MG/3ML) 0.083% IN NEBU: 2.5000 mg | INHALATION_SOLUTION | Freq: Four times a day (QID) | RESPIRATORY_TRACT | Status: DC | PRN

## 2020-07-31 MED ORDER — ENOXAPARIN SODIUM 40 MG/0.4ML ~~LOC~~ SOLN: 40.0000 mg | SUBCUTANEOUS | Status: DC

## 2020-07-31 MED ORDER — MORPHINE SULFATE (PF) 4 MG/ML IV SOLN
4.0000 mg | INTRAVENOUS | Status: AC | PRN
  Administered 2020-07-31 – 2020-08-01 (×2): 4 mg via INTRAVENOUS
  Filled 2020-07-31 (×2): qty 1

## 2020-07-31 NOTE — ED Provider Notes (Signed)
[facility] EMERGENCY DEPARTMENT Provider Note   CSN: [id number] Arrival date & time: 07/31/20  1121     History Chief Complaint  Patient presents with  . Altered Mental Status    Brenda Morgan is a 64 y.o. female.  HPI   Patient presents to the emergency department with chief complaint of increasing back pain as well as purulent discharge from her wound.  Patient explains she had back surgery on her lumbar spine, fusion of L4-L5 on 07/06 with Dr. Conchita Paris.  Since then she is complaining of increasing lower back pain as well as lower leg pain.  She has been taking her pain medicine oxycodone without any relief.  She denies paresthesias in her legs, incontinency or lose control of her bowels.  She was seen at urgent care on the 31st and was placed on antibiotics and instructed to follow-up with neurosurgery on Monday.  She comes today with increasing pain as lethargic.  She admits to fevers and chills, denies nausea and vomiting.  Patient has significant medical history of COPD, diabetes, sleep apnea.  She denies headache, sore throat, cough, chest pain, shortness of breath, abdominal pain, dysuria, pedal edema.  Past Medical History:  Diagnosis Date  . COPD (chronic obstructive pulmonary disease) (HCC)   . Diabetes mellitus without complication (HCC)   . Fatty liver 2012   per pt 06/24/20  . Sleep apnea     Patient Active Problem List   Diagnosis Date Noted  . Postoperative back pain 07/06/2020  . Lumbar radiculopathy 07/06/2020  . Lumbar spinal stenosis 06/28/2020    Past Surgical History:  Procedure Laterality Date  . BACK SURGERY    . carotid artery surgery Right 10/07/2012  . carotid ultrasound  06/2015  . CHOLECYSTECTOMY  1981  . COLONOSCOPY  2014  . DIAGNOSTIC MAMMOGRAM  03/2017  . groin lypnoid Left 03/2015  . right heel spur  1994  . TUBAL LIGATION  1985  . wrist trigger release Right 01/2016     OB History   No obstetric history on  file.     No family history on file.  Social History   Tobacco Use  . Smoking status: Current Every Day Smoker    Packs/day: 0.50    Types: Cigarettes  . Smokeless tobacco: Never Used  Substance Use Topics  . Alcohol use: Not Currently  . Drug use: Not Currently    Home Medications Prior to Admission medications   Medication Sig Start Date End Date Taking? Authorizing Provider  atorvastatin (LIPITOR) 10 MG tablet Take 5 mg by mouth daily.    [provider]  baclofen (LIORESAL) 20 MG tablet Take 20 mg by mouth 3 (three) times daily.    [provider]  carvedilol (COREG) 25 MG tablet Take 25 mg by mouth daily.    [provider]  cetirizine (ZYRTEC) 10 MG tablet Take 10 mg by mouth daily.    [provider]  Cholecalciferol (VITAMIN D) 50 MCG (2000 UT) tablet Take 2,000 Units by mouth daily.    [provider]  Dietary Management Product (OMNIVEX PO) Take 1 tablet by mouth daily.    [provider]  doxycycline (VIBRAMYCIN) 100 MG capsule Take 1 capsule (100 mg total) by mouth 2 (two) times daily. 07/23/20   Wallis Bamberg, PA-C  Empagliflozin-metFORMIN HCl ER (SYNJARDY XR) 25-1000 MG TB24 Take 1 tablet by mouth daily.    [provider]  famotidine (PEPCID) 40 MG tablet Take 40 mg  by mouth daily.    [provider]  ferrous sulfate 325 (65 FE) MG tablet Take 325 mg by mouth daily with breakfast.    [provider]  fluticasone (FLONASE) 50 MCG/ACT nasal spray Place 1 spray into both nostrils 2 (two) times daily as needed for allergies or rhinitis.    [provider]  Fluticasone-Salmeterol (ADVAIR) 250-50 MCG/DOSE AEPB Inhale 1 puff into the lungs 2 (two) times daily.    [provider]  icosapent Ethyl (VASCEPA) 1 g capsule Take 1 g by mouth 2 (two) times daily.    [provider]  insulin aspart protamine- aspart (NOVOLOG MIX 70/30) (70-30) 100 UNIT/ML injection Inject 35-55  Units into the skin 2 (two) times daily with a meal.    [provider]  ipratropium-albuterol (DUONEB) 0.5-2.5 (3) MG/3ML SOLN Take 3 mLs by nebulization every 6 (six) hours as needed (asthma).    [provider]  lactulose (CHRONULAC) 10 GM/15ML solution Take 10 g by mouth 3 (three) times daily.    [provider]  lamoTRIgine (LAMICTAL) 150 MG tablet Take 150 mg by mouth daily.    [provider]  magnesium oxide (MAG-OX) 400 MG tablet Take 400 mg by mouth daily.    [provider]  methocarbamol (ROBAXIN-750) 750 MG tablet Take 1 tablet (750 mg total) by mouth 3 (three) times daily as needed for muscle spasms. 06/30/20   Costella, Darci CurrentVincent J, PA-C  methylPREDNISolone (MEDROL) 4 MG TBPK tablet Take according to package insert 07/11/20   Costella, Darci CurrentVincent J, PA-C  metolazone (ZAROXOLYN) 5 MG tablet Take 5 mg by mouth 3 (three) times a week.    [provider]  oxyCODONE (OXY IR/ROXICODONE) 5 MG immediate release tablet Take 1 tablet (5 mg total) by mouth every 4 (four) hours as needed for severe pain. 07/11/20   Costella, Darci CurrentVincent J, PA-C  pantoprazole (PROTONIX) 40 MG tablet Take 40 mg by mouth daily.    [provider]  pioglitazone (ACTOS) 15 MG tablet Take 15 mg by mouth daily.    [provider]  potassium chloride SA (KLOR-CON) 20 MEQ tablet Take 20 mEq by mouth daily.    [provider]  pramipexole (MIRAPEX) 0.5 MG tablet Take 1 mg by mouth at bedtime.    [provider]  pregabalin (LYRICA) 200 MG capsule Take 200 mg by mouth in the morning, at noon, and at bedtime.    [provider]  Semaglutide, 1 MG/DOSE, (OZEMPIC, 1 MG/DOSE,) 2 MG/1.5ML SOPN Inject 1 mg into the skin every Sunday.    [provider]  silver sulfADIAZINE (SILVADENE) 1 % cream Apply 1 application topically daily as needed (leg sores).     [provider]  spironolactone (ALDACTONE) 50 MG tablet Take 50 mg by  mouth daily.    [provider]  torsemide (DEMADEX) 20 MG tablet Take 20 mg by mouth daily.    [provider]  Vitamin D, Ergocalciferol, (DRISDOL) 1.25 MG (50000 UNIT) CAPS capsule Take 50,000 Units by mouth every 7 (seven) days.    [provider]    Allergies    Amlodipine, Naltrexone, and Penicillins  Review of Systems   Review of Systems  Constitutional: Positive for chills, fatigue and fever.  HENT: Negative for congestion, tinnitus and trouble swallowing.   Respiratory: Negative for cough and shortness of breath.   Cardiovascular: Negative for chest pain.  Gastrointestinal: Negative for abdominal pain, diarrhea, nausea and vomiting.  Genitourinary: Negative  for dysuria, enuresis and flank pain.  Musculoskeletal: Positive for back pain.  Skin: Negative for rash.  Neurological: Negative for dizziness and headaches.  Hematological: Does not bruise/bleed easily.    Physical Exam Updated Vital Signs BP 134/66   Pulse (!) 115   Temp 98.6 F (37 C) (Oral) Comment: Pt drank Sprite  Resp 18   SpO2 100%   Physical Exam Vitals and nursing note reviewed.  Constitutional:      General: She is not in acute distress.    Appearance: She is not ill-appearing.  HENT:     Head: Normocephalic and atraumatic.     Nose: No congestion.     Mouth/Throat:     Mouth: Mucous membranes are moist.     Pharynx: Oropharynx is clear.  Eyes:     General: No scleral icterus. Cardiovascular:     Rate and Rhythm: Normal rate and regular rhythm.     Pulses: Normal pulses.     Heart sounds: No murmur heard.  No friction rub. No gallop.   Pulmonary:     Effort: No respiratory distress.     Breath sounds: No wheezing, rhonchi or rales.  Abdominal:     General: There is no distension.     Tenderness: There is no abdominal tenderness. There is no right CVA tenderness, left CVA tenderness or guarding.  Musculoskeletal:        General: No swelling.     Comments:  Patient's back was visualized, there was a 5 cm incision on her lumbar spine.  It was erythematous, no discharge or drainage noted.  It was tender to palpation, no fluctuance noted but some induration felt.  Not warm to the touch.  Skin:    General: Skin is warm and dry.     Capillary Refill: Capillary refill takes less than 2 seconds.     Findings: No rash.  Neurological:     General: No focal deficit present.     Mental Status: She is alert and oriented to person, place, and time.  Psychiatric:        Mood and Affect: Mood normal.       ED Results / Procedures / Treatments   Labs (all labs ordered are listed, but only abnormal results are displayed) Labs Reviewed  LACTIC ACID, PLASMA - Abnormal; Notable for the following components:      Result Value   Lactic Acid, Venous 2.6 (*)    All other components within normal limits  COMPREHENSIVE METABOLIC PANEL - Abnormal; Notable for the following components:   Chloride 95 (*)    Glucose, Bld 163 (*)    BUN 24 (*)    Creatinine, Ser 1.17 (*)    Albumin 3.3 (*)    Alkaline Phosphatase 131 (*)    GFR calc non Af Amer 50 (*)    GFR calc Af Amer 58 (*)    Anion gap 16 (*)    All other components within normal limits  CBC WITH DIFFERENTIAL/PLATELET - Abnormal; Notable for the following components:   WBC 19.0 (*)    Neutro Abs 14.4 (*)    Monocytes Absolute 1.2 (*)    Abs Immature Granulocytes 0.17 (*)    All other components within normal limits  CBG MONITORING, ED - Abnormal; Notable for the following components:   Glucose-Capillary 121 (*)    All other components within normal limits  SARS CORONAVIRUS 2 BY RT PCR (HOSPITAL ORDER, PERFORMED IN [facility])  CULTURE,  BLOOD (ROUTINE X 2)  CULTURE, BLOOD (ROUTINE X 2)  LACTIC ACID, PLASMA  URINALYSIS, ROUTINE W REFLEX MICROSCOPIC  C-REACTIVE PROTEIN  SEDIMENTATION RATE    EKG None  Radiology No results found.  Procedures Procedures (including critical care  time)  Medications Ordered in ED Medications  sodium chloride flush (NS) 0.9 % injection 3 mL (has no administration in time range)  sodium chloride flush (NS) 0.9 % injection 3 mL (has no administration in time range)  sodium chloride 0.9 % bolus 500 mL (has no administration in time range)  vancomycin (VANCOCIN) IVPB 1000 mg/200 mL premix (1,000 mg Intravenous New Bag/Given 07/31/20 1443)    ED Course  I have reviewed the triage vital signs and the nursing notes.  Pertinent labs & imaging results that were available during my care of the patient were reviewed by me and considered in my medical decision making (see chart for details).    MDM Rules/Calculators/A&P                          I have personally reviewed all imaging, labs and have interpreted them.  Patient was alert and oriented, appeared to be in mild distress due to back pain.  On exam there was 5 cm surgical incision noted in the lumbar spine of her back.  Induration felt, no fluctuance noted.  Patient was afebrile, nontachycardic, nontachypneic, but had elevated leukocytosis as well as elevated lactate.  Concern for worsening infection will start patient on IV antibiotics, consult neurosurgery as well as hospitalist team for admission.  Spoke with Dr. Vilinda Blanks of neurosurgery and he agrees that patient needs to be admitted and agrees with current work-up.  He will come and evaluate the patient later today.  Low suspicion for septic shock as patient was not hypotensive, not tachycardic, she does not meet SIRS criteria.  Unlikely patient suffering from CHF exacerbation as lung sounds were clear bilaterally, no signs of fluid overload noted no JVD or pedal edema noted.  Unlikely patient suffering from acute intra-abdominal infection as patient denies abdominal pain, abdominal exam was benign no signs of acute abdomen noted.  No signs of anemia noted on CBC.  CMP does not show electrolyte abnormalities, does show AKI will start  patient on fluids and monitor.  Spoke with Dr. Katrinka Blazing of the hospitalist team and he will admit the patient for further management and evaluation.  Patient care will be handed over to hospitalist team for further management. Final Clinical Impression(s) / ED Diagnoses Final diagnoses:  Wound infection after surgery    Rx / DC Orders ED Discharge Orders    None       Carroll Sage, PA-C 07/31/20 1530    Sabas Sous, MD 07/31/20 [id number]

## 2020-07-31 NOTE — Progress Notes (Signed)
Pharmacy Antibiotic Note  Brenda Morgan is a 64 y.o. female admitted on 07/31/2020 with wound infection.  Pharmacy has been consulted for vancomycin dosing.   Patient had recent back surgery (7/6) and now reports increasing back pain, lethargy, and purulent discharge from her wound. Due to concern for worsening infection, patient to be started on IV antibiotics and admitted for further evaluation and neurosurgery consult.   Today, she is afebrile, WBC 19.0, LA 2.6, Scr 1.17. Given initial call for code sepsis, elevated WBC and LA, will dose based on risk for sepsis.   Plan: Vancomycin 1500 mg x1, then 750 mg q12h Obtain levels as clinically indicated Monitor daily CBC and Scr F/u cultures and antibiotic deescalation    Temp (24hrs), Avg:98.6 F (37 C), Min:98.6 F (37 C), Max:98.6 F (37 C)  Recent Labs  Lab 07/31/20 1153  WBC 19.0*  CREATININE 1.17*  LATICACIDVEN 2.6*    Estimated Creatinine Clearance: 56.6 mL/min (A) (by C-G formula based on SCr of 1.17 mg/dL (H)).    Allergies  Allergen Reactions  . Amlodipine Anaphylaxis, Swelling and Other (See Comments)    Tongue swelling   . Naltrexone Hives  . Penicillins Hives and Other (See Comments)    Tolerated ceftriaxone and cefepime 06/2017 Hives      Antimicrobials this admission: Vancomycin 8/8 >>   Dose adjustments this admission: N/A  Microbiology results: 8/8 BCx x2:    Thank you for allowing pharmacy to be a part of this patient's care.  Trixie Rude, PharmD PGY1 Acute Care Pharmacy Resident 07/31/2020 3:49 PM  Please check [email or web address] for unit-specific pharmacy phone numbers.

## 2020-07-31 NOTE — ED Triage Notes (Signed)
Family reports pt confused and lethargic since last night.  Recent back surgery on 8/6.  Bilateral arm drift.  Pt reports generalized weakness.

## 2020-07-31 NOTE — H&P (Signed)
History and Physical    Brenda Morgan KMQ:[id number] DOB: [birth year] DOA: 07/31/2020  Referring MD/NP/PA: Berle Mull, PA-C PCP: Patient, No Pcp Per  Patient coming from: Home  Chief Complaint: Leg pain  I have personally briefly reviewed patient's old medical records in [facility]   HPI: Brenda Morgan is a 64 y.o. female with medical history significant of COPD, essential hypertension, diabetes mellitus type 2 without complication, fatty liver, and sleep apnea presents with complaints of worsening back pain.  Patient had underwent L4-L5 decompression and fusion on 7/6 by Dr. Conchita Paris.Brenda Morgan  She was readmitted for postoperative back pain with acute kidney injury from 7/14-7/20 treated with steroids and PT/OT.  Seen at urgent care on 7/31 for worsening pain after driving back from reporting drainage from the wound site.  She was given Rocephin and started on doxycycline at that time.  She was seen by Dr. Nundkumar 3 days ago and the wound appeared to be getting better.  He started her on a 10-day course of Bactrim, but wanted to get a MRI lumbar spine for further evaluation.  However, patient was noted to be more lethargic with worsening back pain over the last day.  Purulent drainage and redness was noted around the surgical site.  She complains of decreased sensation on the left leg and was globally weak.  Patient denies any fever or recent falls.  ED Course: Upon admission into the emergency department patient was seen to be afebrile with pulse 74-115.  Labs significant for WBC 19, BUN 24, creatinine 1.17, anion gap 16, alkaline phosphatase 131, and lactic acid 2.6.  No imaging studies were obtained.  Dr. Theola Sequin neurosurgery was formally consulted.  Patient had been given 500 mL of normal saline IV fluids and ordered IV vancomycin..  TRH called to admit.   Review of Systems  Constitutional: Positive for malaise/fatigue. Negative for fever.  HENT: Negative for ear discharge and  nosebleeds.   Eyes: Negative for photophobia and pain.  Respiratory: Negative for cough and shortness of breath.   Cardiovascular: Negative for chest pain.  Gastrointestinal: Negative for abdominal pain, diarrhea, nausea and vomiting.  Genitourinary: Negative for dysuria and hematuria.  Musculoskeletal: Positive for back pain. Negative for falls.  Neurological: Positive for tremors, sensory change and weakness. Negative for speech change.  Psychiatric/Behavioral: Negative for substance abuse.    Past Medical History:  Diagnosis Date  . COPD (chronic obstructive pulmonary disease) (HCC)   . Diabetes mellitus without complication (HCC)   . Fatty liver 2012   per pt 06/24/20  . Sleep apnea     Past Surgical History:  Procedure Laterality Date  . BACK SURGERY    . carotid artery surgery Right 10/07/2012  . carotid ultrasound  06/2015  . CHOLECYSTECTOMY  1981  . COLONOSCOPY  2014  . DIAGNOSTIC MAMMOGRAM  03/2017  . groin lypnoid Left 03/2015  . right heel spur  1994  . TUBAL LIGATION  1985  . wrist trigger release Right 01/2016     reports that she has been smoking cigarettes. She has been smoking about 0.50 packs per day. She has never used smokeless tobacco. She reports previous alcohol use. She reports previous drug use.  Allergies  Allergen Reactions  . Amlodipine Anaphylaxis, Swelling and Other (See Comments)    Tongue swelling   . Naltrexone Hives  . Penicillins Hives and Other (See Comments)    Tolerated ceftriaxone and cefepime 06/2017 Hives      No family history on file.  Prior to Admission medications   Medication Sig Start Date End Date Taking? Authorizing Provider  atorvastatin (LIPITOR) 10 MG tablet Take 5 mg by mouth daily.    [provider]  baclofen (LIORESAL) 20 MG tablet Take 20 mg by mouth 3 (three) times daily.    [provider]  carvedilol (COREG) 25 MG tablet Take 25 mg by mouth daily.    [provider]  cetirizine  (ZYRTEC) 10 MG tablet Take 10 mg by mouth daily.    [provider]  Cholecalciferol (VITAMIN D) 50 MCG (2000 UT) tablet Take 2,000 Units by mouth daily.    [provider]  Dietary Management Product (OMNIVEX PO) Take 1 tablet by mouth daily.    [provider]  doxycycline (VIBRAMYCIN) 100 MG capsule Take 1 capsule (100 mg total) by mouth 2 (two) times daily. 07/23/20   Wallis Bamberg, PA-C  Empagliflozin-metFORMIN HCl ER (SYNJARDY XR) 25-1000 MG TB24 Take 1 tablet by mouth daily.    [provider]  famotidine (PEPCID) 40 MG tablet Take 40 mg by mouth daily.    [provider]  ferrous sulfate 325 (65 FE) MG tablet Take 325 mg by mouth daily with breakfast.    [provider]  fluticasone (FLONASE) 50 MCG/ACT nasal spray Place 1 spray into both nostrils 2 (two) times daily as needed for allergies or rhinitis.    [provider]  Fluticasone-Salmeterol (ADVAIR) 250-50 MCG/DOSE AEPB Inhale 1 puff into the lungs 2 (two) times daily.    [provider]  icosapent Ethyl (VASCEPA) 1 g capsule Take 1 g by mouth 2 (two) times daily.    [provider]  insulin aspart protamine- aspart (NOVOLOG MIX 70/30) (70-30) 100 UNIT/ML injection Inject 35-55 Units into the skin 2 (two) times daily with a meal.    [provider]  ipratropium-albuterol (DUONEB) 0.5-2.5 (3) MG/3ML SOLN Take 3 mLs by nebulization every 6 (six) hours as needed (asthma).    [provider]  lactulose (CHRONULAC) 10 GM/15ML solution Take 10 g by mouth 3 (three) times daily.    [provider]  lamoTRIgine (LAMICTAL) 150 MG tablet Take 150 mg by mouth daily.    [provider]  magnesium oxide (MAG-OX) 400 MG tablet Take 400 mg by mouth daily.    [provider]  methocarbamol (ROBAXIN-750) 750 MG tablet Take 1 tablet (750 mg total) by mouth 3 (three) times daily as needed for muscle spasms. 06/30/20   Costella, Darci Current,  PA-C  methylPREDNISolone (MEDROL) 4 MG TBPK tablet Take according to package insert 07/11/20   Costella, Darci Current, PA-C  metolazone (ZAROXOLYN) 5 MG tablet Take 5 mg by mouth 3 (three) times a week.    [provider]  oxyCODONE (OXY IR/ROXICODONE) 5 MG immediate release tablet Take 1 tablet (5 mg total) by mouth every 4 (four) hours as needed for severe pain. 07/11/20   Costella, Darci Current, PA-C  pantoprazole (PROTONIX) 40 MG tablet Take 40 mg by mouth daily.    [provider]  pioglitazone (ACTOS) 15 MG tablet Take 15 mg by mouth daily.    [provider]  potassium chloride SA (KLOR-CON) 20 MEQ tablet Take 20 mEq by mouth daily.    [provider]  pramipexole (MIRAPEX) 0.5 MG tablet Take 1 mg by mouth at bedtime.    [provider]  pregabalin (LYRICA) 200 MG capsule Take 200 mg by mouth in the morning, at noon, and at  bedtime.    [provider]  Semaglutide, 1 MG/DOSE, (OZEMPIC, 1 MG/DOSE,) 2 MG/1.5ML SOPN Inject 1 mg into the skin every Sunday.    [provider]  silver sulfADIAZINE (SILVADENE) 1 % cream Apply 1 application topically daily as needed (leg sores).     [provider]  spironolactone (ALDACTONE) 50 MG tablet Take 50 mg by mouth daily.    [provider]  torsemide (DEMADEX) 20 MG tablet Take 20 mg by mouth daily.    [provider]  Vitamin D, Ergocalciferol, (DRISDOL) 1.25 MG (50000 UNIT) CAPS capsule Take 50,000 Units by mouth every 7 (seven) days.    [provider]    Physical Exam:  Constitutional: Older female who is lethargic and appears acutely ill Vitals:   07/31/20 1127 07/31/20 1300 07/31/20 1315 07/31/20 1330  BP: (!) 149/55  111/62 134/66  Pulse: 78  74 (!) 115  Resp: 18 13 17 18   Temp: 98.6 F (37 C)     TempSrc: Oral     SpO2: 95%  (!) 89% 100%   Eyes: PERRL, lids and conjunctivae normal ENMT: Mucous membranes are dry. Posterior pharynx clear of any  exudate or lesions. Neck: normal, supple, no masses, no thyromegaly Respiratory: clear to auscultation bilaterally, no wheezing, no crackles. Normal respiratory effort. No accessory muscle use.  Cardiovascular: Regular rate and rhythm, no murmurs / rubs / gallops. No extremity edema. 2+ pedal pulses. No carotid bruits.  Abdomen: no tenderness, no masses palpated. No hepatosplenomegaly. Bowel sounds positive.  Musculoskeletal: no clubbing / cyanosis. No joint deformity upper and lower extremities. Good ROM, no contractures. Normal muscle tone.  Skin: Reviewed pictures of the surgical site which showed purulent drainage Neurologic: CN 2-12 grossly intact.  Decreased sensation on the right lower extremity, DTR normal. Strength 5/5 in all 4.  Psychiatric: Normal judgment and insight.  Lethargic, but oriented x 3. Normal mood.     Labs on Admission: I have personally reviewed following labs and imaging studies  CBC: Recent Labs  Lab 07/31/20 1153  WBC 19.0*  NEUTROABS 14.4*  HGB 13.3  HCT 43.0  MCV 94.1  PLT 256   Basic Metabolic Panel: Recent Labs  Lab 07/31/20 1153  NA 140  K 3.7  CL 95*  CO2 29  GLUCOSE 163*  BUN 24*  CREATININE 1.17*  CALCIUM 9.8   GFR: Estimated Creatinine Clearance: 56.6 mL/min (A) (by C-G formula based on SCr of 1.17 mg/dL (H)). Liver Function Tests: Recent Labs  Lab 07/31/20 1153  AST 15  ALT 13  ALKPHOS 131*  BILITOT 0.5  PROT 7.5  ALBUMIN 3.3*   No results for input(s): LIPASE, AMYLASE in the last 168 hours. No results for input(s): AMMONIA in the last 168 hours. Coagulation Profile: No results for input(s): INR, PROTIME in the last 168 hours. Cardiac Enzymes: No results for input(s): CKTOTAL, CKMB, CKMBINDEX, TROPONINI in the last 168 hours. BNP (last 3 results) No results for input(s): PROBNP in the last 8760 hours. HbA1C: No results for input(s): HGBA1C in the last 72 hours. CBG: Recent Labs  Lab 07/31/20 1353  GLUCAP 121*    Lipid Profile: No results for input(s): CHOL, HDL, LDLCALC, TRIG, CHOLHDL, LDLDIRECT in the last 72 hours. Thyroid Function Tests: No results for input(s): TSH, T4TOTAL, FREET4, T3FREE, THYROIDAB in the last 72 hours. Anemia Panel: No results for input(s): VITAMINB12, FOLATE, FERRITIN, TIBC, IRON, RETICCTPCT in the last 72 hours. Urine analysis:    Component Value Date/Time  COLORURINE YELLOW 07/04/2020 1919   APPEARANCEUR CLEAR 07/04/2020 1919   LABSPEC 1.016 07/04/2020 1919   PHURINE 7.0 07/04/2020 1919   GLUCOSEU >=500 (A) 07/04/2020 1919   HGBUR NEGATIVE 07/04/2020 1919   BILIRUBINUR NEGATIVE 07/04/2020 1919   KETONESUR NEGATIVE 07/04/2020 1919   PROTEINUR NEGATIVE 07/04/2020 1919   NITRITE NEGATIVE 07/04/2020 1919   LEUKOCYTESUR NEGATIVE 07/04/2020 1919   Sepsis Labs: No results found for this or any previous visit (from the past 240 hour(s)).   Radiological Exams on Admission: No results found.  EKG: Independently reviewed.  Sinus rhythm at 79 bpm  Assessment/Plan Sepsis secondary to postoperative wound infection: Acute.  Patient was noted to be tachycardic and tachypneic with WBC elevated at 19 and lactic acid initially 2.6.  Lumbar spine noted to be likely source of infection with purulent drainage discharge reported.  Patient reporting decrease in sensation on the right lower extremity. -Admit to a medical telemetry -Sepsis protocol initiated -Follow-up blood cultures -Check MRI of the lumbar spine -Continue empiric antibiotics of vancomycin -Follow-up urinalysis and portable chest x-ray -2 L of lactated Ringer's given -Recheck CBC in a.m.   Diabetes mellitus type 2: Last hemoglobin A1c 6.7 on 06/24/2020.  Patient appears to be relatively well controlled.  Home medications include NovoLog 70/30 mix 65 units 2 times daily,Smaglutide 1 mg every Sunday, and Empagliflozin-Metformin 25-1000 mg daily. -Hypoglycemic protocol -CBGs before every meal with sensitive SSI   -Reduced 70/30 mix to 30 units twice daily  Essential hypertension: Blood pressures currently relatively well controlled. -Continue Coreg -Will need to restart rest of patient's home regimen as tolerated when medically appropriate  Chronic kidney disease stage IIIa: Creatinine mildly elevated at 1.17 with BUN 24 to suggest patient dehydrated.  Baseline creatinine appears to be around 1. -Recheck kidney function in a.m.  Hyperlipidemia -Continue atorvastatin and icosapent Ethyl   DVT prophylaxis: Lovenox Code Status: Full Family Communication: Husband updated at bedside Disposition Plan: To be determined Consults called: Neurosurgery Admission status: Inpatient  Clydie Braunondell A Fariha Goto MD [facility] Pager [phone]   If 7PM-7AM, please contact night-coverage [email or web address] Password [email or web address]  07/31/2020, 3:12 PM

## 2020-07-31 NOTE — Plan of Care (Signed)
The patient is admitted from ED with the diagnosis of postoperative wound infection. A & O x 4 Patient oriented to her room, call bell/ascom and staff. Full assessment to epic completed. Patient was in tears due to pain on her back and there was no PRN  pain medicine ordered. Paged Blount NP and received Morphine 4 mg IV every 4 hours as needed, The order has already been administered. Will continue to monitor.

## 2020-07-31 NOTE — Consult Note (Signed)
   Providing Compassionate, Quality Care - Together  Neurosurgery Consult  Referring physician: ED Reason for referral: Wound infection  Chief Complaint: Lethargy/wound drainage  History of Present Illness: This is a 64 year old female with a history of diabetes, obesity, smoking that is status post L4-5 lumbar fusion by Dr. Conchita Paris on 06/28/2020 that returns to the emergency department for wound drainage and confusion and lethargy.  She was seen recently in the office on 4 August with some wound drainage in which she was given oral antibiotics.  She had no signs of systemic infection at that time, was afebrile and doing well otherwise.  At this time she appears confused and majority of the history is per the chart.  She notes drainage in her lumbar wound incision for a few weeks.  She is afebrile at this time but stated she did have a fever at home but does not remember the degree.  She denies any focal changes in her neurologic function but does state she has some intermittent leg pain.  She denies any bowel or bladder changes.    Medications: I have reviewed the patient's current medications. Allergies: No Known Allergies  History reviewed. No pertinent family history. Social History:  has no history on file for tobacco use, alcohol use, and drug use.  ROS: 14 point review of system performed, all pertinent pos and neg listed above.  Physical Exam:  Vital signs in last 24 hours: Temp:  [98 F (36.7 C)-98.3 F (36.8 C)] 98 F (36.7 C) (07/25 1814) Pulse Rate:  [58-128] 65 (07/26 0746) Resp:  [11-18] 14 (07/26 0217) BP: (138-182)/(65-125) 153/88 (07/26 0700) SpO2:  [91 %-98 %] 96 % (07/26 0746) PE: Lethargic, carries on short conversation and falls asleep disoriented and confused PERRL EOMI Face symmetric MAE equally SILT Lumbar incision has erythema and some drainage on dressing, granulation tissue noted along incision, tender to palpation, no  fluctuance   Impression/Assessment:  64 yo F with:  1. Lumbar wound infection 2. Encephalopathy, due to #1 3. Sepsis S/p L4-5 TLIF on 06/28/2020  Plan:  -admit to medicine -support care, bld cx x2, IVF, broad spectrum abx -likely sepsis due to wound infection, plan wound washout Tuesday and placement of wound vac -pain control -dvt ppx   Thank you for allowing me to participate in this patient's care.  Please do not hesitate to call with questions or concerns.   Monia Pouch, DO Neurosurgeon [facility] Cell: [phone]

## 2020-08-01 ENCOUNTER — Other Ambulatory Visit: Payer: Self-pay | Admitting: Neurological Surgery

## 2020-08-01 DIAGNOSIS — I152 Hypertension secondary to endocrine disorders: Secondary | ICD-10-CM

## 2020-08-01 DIAGNOSIS — E119 Type 2 diabetes mellitus without complications: Secondary | ICD-10-CM

## 2020-08-01 DIAGNOSIS — E1122 Type 2 diabetes mellitus with diabetic chronic kidney disease: Secondary | ICD-10-CM

## 2020-08-01 DIAGNOSIS — J449 Chronic obstructive pulmonary disease, unspecified: Secondary | ICD-10-CM

## 2020-08-01 DIAGNOSIS — I10 Essential (primary) hypertension: Secondary | ICD-10-CM

## 2020-08-01 HISTORY — DX: Type 2 diabetes mellitus without complications: E11.9

## 2020-08-01 HISTORY — DX: Type 2 diabetes mellitus with other circulatory complications: I15.2

## 2020-08-01 LAB — CBC
HCT: 35.5 % — ABNORMAL LOW (ref 36.0–46.0)
Hemoglobin: 11.3 g/dL — ABNORMAL LOW (ref 12.0–15.0)
MCH: 29.4 pg (ref 26.0–34.0)
MCHC: 31.8 g/dL (ref 30.0–36.0)
MCV: 92.4 fL (ref 80.0–100.0)
Platelets: 187 10*3/uL (ref 150–400)
RBC: 3.84 MIL/uL — ABNORMAL LOW (ref 3.87–5.11)
RDW: 15.3 % (ref 11.5–15.5)
WBC: 11.2 10*3/uL — ABNORMAL HIGH (ref 4.0–10.5)
nRBC: 0 % (ref 0.0–0.2)

## 2020-08-01 LAB — GLUCOSE, CAPILLARY
Glucose-Capillary: 103 mg/dL — ABNORMAL HIGH (ref 70–99)
Glucose-Capillary: 243 mg/dL — ABNORMAL HIGH (ref 70–99)
Glucose-Capillary: 243 mg/dL — ABNORMAL HIGH (ref 70–99)
Glucose-Capillary: 155 mg/dL — ABNORMAL HIGH (ref 70–99)

## 2020-08-01 LAB — MRSA PCR SCREENING: MRSA by PCR: POSITIVE — AB

## 2020-08-01 LAB — BASIC METABOLIC PANEL
Anion gap: 13 (ref 5–15)
BUN: 14 mg/dL (ref 8–23)
CO2: 28 mmol/L (ref 22–32)
Calcium: 8.9 mg/dL (ref 8.9–10.3)
Chloride: 96 mmol/L — ABNORMAL LOW (ref 98–111)
Creatinine, Ser: 0.82 mg/dL (ref 0.44–1.00)
GFR calc Af Amer: >60 mL/min (ref >60)
GFR calc non Af Amer: >60 mL/min (ref >60)
Glucose, Bld: 108 mg/dL — ABNORMAL HIGH (ref 70–99)
Potassium: 3.4 mmol/L — ABNORMAL LOW (ref 3.5–5.1)
Sodium: 137 mmol/L (ref 135–145)

## 2020-08-01 LAB — HIV ANTIBODY (ROUTINE TESTING W REFLEX): HIV Screen 4th Generation wRfx: NONREACTIVE

## 2020-08-01 MED ORDER — MORPHINE SULFATE (PF) 2 MG/ML IV SOLN
1.0000 mg | INTRAVENOUS | Status: DC | PRN
  Administered 2020-08-01 – 2020-08-02 (×5): 1 mg via INTRAVENOUS
  Filled 2020-08-01 (×5): qty 1

## 2020-08-01 MED ORDER — MUPIROCIN 2 % EX OINT
1.0000 "application " | TOPICAL_OINTMENT | Freq: Two times a day (BID) | CUTANEOUS | Status: DC
  Administered 2020-08-01 – 2020-08-05 (×7): 1 via NASAL
  Filled 2020-08-01: qty 22

## 2020-08-01 MED ORDER — INSULIN ASPART 100 UNIT/ML ~~LOC~~ SOLN
0.0000 [IU] | Freq: Every day | SUBCUTANEOUS | Status: DC
  Administered 2020-08-02: 2 [IU] via SUBCUTANEOUS

## 2020-08-01 MED ORDER — POTASSIUM CHLORIDE 20 MEQ PO PACK
40.0000 meq | PACK | Freq: Once | ORAL | Status: AC
  Administered 2020-08-01: 40 meq via ORAL
  Filled 2020-08-01: qty 2

## 2020-08-01 MED ORDER — VANCOMYCIN HCL IN DEXTROSE 1-5 GM/200ML-% IV SOLN
1000.0000 mg | Freq: Two times a day (BID) | INTRAVENOUS | Status: DC
  Administered 2020-08-01 – 2020-08-05 (×9): 1000 mg via INTRAVENOUS
  Filled 2020-08-01 (×8): qty 200

## 2020-08-01 MED ORDER — CHLORHEXIDINE GLUCONATE CLOTH 2 % EX PADS
6.0000 | MEDICATED_PAD | Freq: Every day | CUTANEOUS | Status: DC
  Administered 2020-08-02 – 2020-08-05 (×4): 6 via TOPICAL

## 2020-08-01 MED ORDER — INSULIN ASPART 100 UNIT/ML ~~LOC~~ SOLN
0.0000 [IU] | Freq: Three times a day (TID) | SUBCUTANEOUS | Status: DC
  Administered 2020-08-01 – 2020-08-02 (×2): 5 [IU] via SUBCUTANEOUS
  Administered 2020-08-03: 2 [IU] via SUBCUTANEOUS
  Administered 2020-08-03: 3 [IU] via SUBCUTANEOUS
  Administered 2020-08-04: 2 [IU] via SUBCUTANEOUS
  Administered 2020-08-04 (×2): 3 [IU] via SUBCUTANEOUS
  Administered 2020-08-05: 8 [IU] via SUBCUTANEOUS
  Administered 2020-08-05: 3 [IU] via SUBCUTANEOUS

## 2020-08-01 NOTE — Progress Notes (Signed)
Pt husband has told NT that he was going to take pt out of hospital d/t staff not coming to room right when he calls. Pt husband stated to NT that he is not patient and wants pt needs met immediately when he calls. Husband reported to this nurse that he understands we have our jobs to do but it is hard for him to watch the pt be uncomfortable. This nurse attempted to reassure husband and pt. NT again reported pt husband stating he was going to take her out of the hospital. Katherina Right RN

## 2020-08-01 NOTE — Progress Notes (Signed)
Pt was able to void in bedside commode. Pt able to void clear yellow urine. Mayford Knife RN

## 2020-08-01 NOTE — Progress Notes (Signed)
PROGRESS NOTE    Brenda Morgan  JQG:[id number] DOB: Aug 07, 1956 DOA: 07/31/2020   PCP: Patient, No Pcp Per   Brief Narrative:  Brenda Morgan is a 64 y.o. female with medical history significant of COPD, essential hypertension, diabetes mellitus type 2 without complication, fatty liver, and sleep apnea presents with complaints of worsening back pain.  Patient had underwent L4-L5 decompression and fusion on 7/6 by Dr. Conchita Paris.Brenda Morgan  She was readmitted for postoperative back pain with acute kidney injury from 7/14-7/20 treated with steroids and PT/OT.  Seen at urgent care on 7/31 for worsening pain after reporting drainage from the wound site.  She was given Rocephin and started on doxycycline at that time.  She was seen by Dr. Nundkumar 3 days ago and the wound appeared to be getting better.  He started her on a 10-day course of Bactrim, but wanted to get a MRI lumbar spine for further evaluation.  However, patient was noted to be more lethargic with worsening back pain over the last day. Purulent drainage and redness was noted around the surgical site.  She complains of decreased sensation on the left leg and was globally weak. Patient denies any fever or recent falls. Dr. Theola Sequin neurosurgery was formally consulted.    Patient is admitted for sepsis secondary to postoperative wound infection.  Patient is started on IV antibiotics.  Assessment & Plan:   Principal Problem:   Postoperative wound infection Active Problems:   Postoperative back pain   COPD (chronic obstructive pulmonary disease) (HCC)   DM II (diabetes mellitus, type II), controlled (HCC)   HTN (hypertension)   Sepsis secondary to postoperative wound infection: Acute.   Patient was noted to be tachycardic and tachypneic with WBC elevated at 19 and lactic acid initially 2.6.   Lumbar spine noted to be likely source of infection with purulent drainage discharge reported.   Patient reporting decrease in sensation on the right lower  extremity. -Admitted to telemetry -Sepsis protocol initiated. - Blood cultures : No growth so far -MRI of the lumbar spine : Marrow edema within L4-L5 vertebral body -Continue empiric antibiotics of vancomycin -  UA , CXR : unremarkable. -2 L of lactated Ringer's given -Neurosurgery consulted .  Plan: Washout and placement of wound VAC on Tuesday.   Diabetes mellitus type 2:  Last hemoglobin A1c 6.7 on 06/24/2020.   Patient appears to be relatively well controlled.   Home medications include NovoLog 70/30 mix 65 units 2 times daily,Smaglutide 1 mg every "Sunday, and Empagliflozin-Metformin 25-1000 mg daily. -Hypoglycemic protocol -CBGs before every meal with sensitive SSI  -Reduced 70/30 mix to 30 units twice daily  Essential hypertension:  Blood pressures currently relatively well controlled. -Continue Coreg -Will need to restart rest of patient's home regimen as tolerated when medically appropriate.  Chronic kidney disease stage IIIa:  Creatinine mildly elevated at 1.17 with BUN 24 to suggest patient dehydrated.   Baseline creatinine appears to be around 1. -Recheck kidney function in a.m.  Hyperlipidemia -Continue atorvastatin    DVT prophylaxis: Lovenox Code Status: Full Family Communication: Husband updated at bedside. Disposition Plan: To be determined Consults called: Neurosurgery Admission status: Inpatient   Consultants:    Neurosurgery  Procedures: Antimicrobials:  Anti-infectives (From admission, onward)   Start     Dose/Rate Route Frequency Ordered Stop   08/01/20 0400  vancomycin (VANCOREADY) IVPB 750 mg/150 mL     Discontinue     75" 0 mg 150 mL/hr over 60 Minutes Intravenous Every 12 hours 07/31/20 1559  07/31/20 1600  vancomycin (VANCOREADY) IVPB 500 mg/100 mL        500 mg 100 mL/hr over 60 Minutes Intravenous  Once 07/31/20 1559 07/31/20 1833   07/31/20 1400  vancomycin (VANCOCIN) IVPB 1000 mg/200 mL premix        1,000 mg 200 mL/hr over  60 Minutes Intravenous  Once 07/31/20 1357 07/31/20 1543      Subjective: Patient was seen and examined at bedside.  No overnight events.  Patient reports pain is not controlled.  Son was at bedside,  all questions answered. Objective: Vitals:   08/01/20 0501 08/01/20 0600 08/01/20 0717 08/01/20 1202  BP: 114/72  117/60 (!) 111/54  Pulse:  64 67 74  Resp: 13 17 20 20   Temp:   98.1 F (36.7 C) 98.5 F (36.9 C)  TempSrc:   Oral   SpO2: 96% 97% (!) 82% 94%    Intake/Output Summary (Last 24 hours) at 08/01/2020 1221 Last data filed at 08/01/2020 1030 Gross per 24 hour  Intake --  Output 1800 ml  Net -1800 ml   There were no vitals filed for this visit.  Examination:  General exam: Appears calm and comfortable. Respiratory system: Clear to auscultation. Respiratory effort normal. Cardiovascular system: S1 & S2 heard, RRR. No JVD, murmurs, rubs, gallops or clicks. No pedal edema. Gastrointestinal system: Abdomen is nondistended, soft and nontender. No organomegaly or masses felt. Normal bowel sounds heard. Central nervous system: Alert and oriented. No focal neurological deficits. Extremities: No leg swelling, no cyanosis, no edema. Skin: No rashes, lesions or ulcers Psychiatry: Judgement and insight appear normal. Mood & affect appropriate.     Data Reviewed: I have personally reviewed following labs and imaging studies  CBC: Recent Labs  Lab 07/31/20 1153 08/01/20 0542  WBC 19.0* 11.2*  NEUTROABS 14.4*  --   HGB 13.3 11.3*  HCT 43.0 35.5*  MCV 94.1 92.4  PLT 256 187   Basic Metabolic Panel: Recent Labs  Lab 07/31/20 1153 08/01/20 0542  NA 140 137  K 3.7 3.4*  CL 95* 96*  CO2 29 28  GLUCOSE 163* 108*  BUN 24* 14  CREATININE 1.17* 0.82  CALCIUM 9.8 8.9   GFR: Estimated Creatinine Clearance: 80.7 mL/min (by C-G formula based on SCr of 0.82 mg/dL). Liver Function Tests: Recent Labs  Lab 07/31/20 1153  AST 15  ALT 13  ALKPHOS 131*  BILITOT 0.5  PROT  7.5  ALBUMIN 3.3*   No results for input(s): LIPASE, AMYLASE in the last 168 hours. No results for input(s): AMMONIA in the last 168 hours. Coagulation Profile: No results for input(s): INR, PROTIME in the last 168 hours. Cardiac Enzymes: No results for input(s): CKTOTAL, CKMB, CKMBINDEX, TROPONINI in the last 168 hours. BNP (last 3 results) No results for input(s): PROBNP in the last 8760 hours. HbA1C: No results for input(s): HGBA1C in the last 72 hours. CBG: Recent Labs  Lab 07/31/20 1353 08/01/20 0715 08/01/20 1201  GLUCAP 121* 103* 243*   Lipid Profile: No results for input(s): CHOL, HDL, LDLCALC, TRIG, CHOLHDL, LDLDIRECT in the last 72 hours. Thyroid Function Tests: No results for input(s): TSH, T4TOTAL, FREET4, T3FREE, THYROIDAB in the last 72 hours. Anemia Panel: No results for input(s): VITAMINB12, FOLATE, FERRITIN, TIBC, IRON, RETICCTPCT in the last 72 hours. Sepsis Labs: Recent Labs  Lab 07/31/20 1153 07/31/20 1558 07/31/20 2113  LATICACIDVEN 2.6* 1.5 1.4    Recent Results (from the past 240 hour(s))  Blood culture (routine x 2)  Status: None (Preliminary result)   Collection Time: 07/31/20 11:57 AM   Specimen: BLOOD RIGHT HAND  Result Value Ref Range Status   Specimen Description BLOOD RIGHT HAND  Final   Special Requests   Final    BOTTLES DRAWN AEROBIC AND ANAEROBIC Blood Culture adequate volume   Culture   Final    NO GROWTH < 24 HOURS Performed at [facility], [address] [place], [place] [address]    Report Status PENDING  Incomplete  Blood culture (routine x 2)     Status: None (Preliminary result)   Collection Time: 07/31/20  2:18 PM   Specimen: BLOOD  Result Value Ref Range Status   Specimen Description BLOOD RIGHT ANTECUBITAL  Final   Special Requests   Final    BOTTLES DRAWN AEROBIC AND ANAEROBIC Blood Culture results may not be optimal due to an inadequate volume of blood received in culture bottles   Culture   Final    NO  GROWTH < 24 HOURS Performed at [facility], [address] [place], [place] [address]    Report Status PENDING  Incomplete  SARS Coronavirus 2 by RT PCR (hospital order, performed in [facility] hospital lab) Nasopharyngeal Nasopharyngeal Swab     Status: None   Collection Time: 07/31/20  4:09 PM   Specimen: Nasopharyngeal Swab  Result Value Ref Range Status   SARS Coronavirus 2 NEGATIVE NEGATIVE Final    Comment: (NOTE) SARS-CoV-2 target nucleic acids are NOT DETECTED.  The SARS-CoV-2 RNA is generally detectable in upper and lower respiratory specimens during the acute phase of infection. The lowest concentration of SARS-CoV-2 viral copies this assay can detect is 250 copies / mL. A negative result does not preclude SARS-CoV-2 infection and should not be used as the sole basis for treatment or other patient management decisions.  A negative result may occur with improper specimen collection / handling, submission of specimen other than nasopharyngeal swab, presence of viral mutation(s) within the areas targeted by this assay, and inadequate number of viral copies (<250 copies / mL). A negative result must be combined with clinical observations, patient history, and epidemiological information.  Fact Sheet for Patients:   BoilerBrush.com.cyhttps://www.fda.gov/media/136312/download  Fact Sheet for Healthcare Providers: https://pope.com/https://www.fda.gov/media/136313/download  This test is not yet approved or  cleared by the [place] FDA and has been authorized for detection and/or diagnosis of SARS-CoV-2 by FDA under an Emergency Use Authorization (EUA).  This EUA will remain in effect (meaning this test can be used) for the duration of the COVID-19 declaration under Section 564(b)(1) of the Act, 21 U.S.C. section 360bbb-3(b)(1), unless the authorization is terminated or revoked sooner.  Performed at [facility], [address] [place], [place] [address]   MRSA PCR Screening     Status:  Abnormal   Collection Time: 08/01/20  7:16 AM   Specimen: Nasal Mucosa; Nasopharyngeal  Result Value Ref Range Status   MRSA by PCR POSITIVE (A) NEGATIVE Final    Comment:        The GeneXpert MRSA Assay (FDA approved for NASAL specimens only), is one component of a comprehensive MRSA colonization surveillance program. It is not intended to diagnose MRSA infection nor to guide or monitor treatment for MRSA infections. RESULT CALLED TO, READ BACK BY AND VERIFIED WITH: A. Lavway RN 10:50 08/01/20 ([email or web address]) Performed at [facility], [address] [place], [place] [address]      Radiology Studies: MR LUMBAR SPINE WO  CONTRAST  Result Date: 07/31/2020 CLINICAL DATA:  Lower back pain, recent surgery EXAM: MRI LUMBAR SPINE WITHOUT CONTRAST TECHNIQUE: Multiplanar, multisequence MR imaging of the lumbar spine was performed. No intravenous contrast was administered. COMPARISON:  Radiograph July 07, 2020 FINDINGS: Segmentation: There are 5 non-rib bearing lumbar type vertebral bodies with the last intervertebral disc space labeled as L5-S1. Alignment:  There is a minimal anterolisthesis of L4 on L5. Vertebrae: The patient is status post decompression at L4-L5 with posterior fixation with surrounding metallic artifact. There is an interbody fixation device seen at L4-L5 which is not well evaluated on this exam. Mildly increased marrow edema seen within the L4 and L5 vertebral bodies. There also appears to be slight superior endplate compression deformity of the L5 vertebral body with less than 25% loss in height. Conus medullaris and cauda equina: Conus extends to the L1 level. Conus and cauda equina appear normal. Paraspinal and other soft tissues: Postsurgical changes are seen at L4-L5 with subcutaneous edema and fatty atrophy of the paraspinal musculature. There is overlying skin defect with a surgical tract, with possible small amount of fluid seen within the surgical tract. However no definite  large loculated fluid collections are seen. Disc levels: T12-L1:  No significant canal or neural foraminal narrowing. L1-L2:   No significant canal or neural foraminal narrowing. L2-L3: Facet arthrosis is present which causes mild bilateral neural foraminal narrowing. L3-L4: There is a broad-based disc bulge and facet arthrosis which causes moderate bilateral neural foraminal narrowing. L4-L5: Limited evaluation due to artifact, however facet arthrosis is present. L5-S1: There is a broad-based disc bulge and facet arthrosis which causes moderate bilateral neural foraminal narrowing. IMPRESSION: 1. Status post decompression and fixation at L4-L5 with surrounding metallic artifact. There does appear to be marrow edema within the L4 and L5 vertebral body. 2. mild superior endplate compression of the L5 vertebral body with the interbody fixation device not well evaluated. If further evaluation is required would recommend CT. 3. Overlying postsurgical changes at L4-L5 with a surgical tract with a small amount of fluid within the tract. However no loculated fluid collections are noted. Electronically Signed   By: Jonna Clark M.D.   On: 07/31/2020 19:30   DG CHEST PORT 1 VIEW  Result Date: 07/31/2020 CLINICAL DATA:  Shortness of breath EXAM: PORTABLE CHEST 1 VIEW COMPARISON:  None. FINDINGS: Heart and mediastinal contours are within normal limits. No focal opacities or effusions. No acute bony abnormality. IMPRESSION: No active disease. Electronically Signed   By: Charlett Nose M.D.   On: 07/31/2020 17:43    Scheduled Meds: . [START ON 08/02/2020] Chlorhexidine Gluconate Cloth  6 each Topical Q0600  . feeding supplement (ENSURE ENLIVE)  237 mL Oral BID BM  . mupirocin ointment  1 application Nasal BID  . sodium chloride flush  3 mL Intravenous Once  . sodium chloride flush  3 mL Intravenous Q12H   Continuous Infusions: . vancomycin 750 mg (08/01/20 0508)     LOS: 1 day    Time spent: 25  mins.   Cipriano Bunker, MD [facility]   If 7PM-7AM, please contact night-coverage

## 2020-08-01 NOTE — Progress Notes (Addendum)
  NEUROSURGERY PROGRESS NOTE   No issues overnight. Much more lucid today Complains of mostly back and right thigh pain, manageable with current regimen Does complain of right leg weakness, but attributes it to pain  EXAM:  BP 117/60 (BP Location: Left Arm)   Pulse 67   Temp 98.1 F (36.7 C) (Oral)   Resp 20   SpO2 (!) 82%   Awake, alert, oriented x4  Speech fluent, appropriate  CN grossly intact  5/5 BUE/LLE, 4/4+ RLE (appears pain mediated) Incision: multiple small openings in wound with granulation tissue noted. Surrounding redness with tenderness to palpation. No obvious fluctuance. Purulent discharge on bandage without active drainage currently.  IMPRESSION/PLAN 64 y.o. female s/p L4-5 fusion two months ago, admitted with sepsis secondary to lumbar wound infection. Much more lucid today. - plan for lumbar wound washout with placement of wound vac tomorrow. NPO at midnight - continue Vanc  - hold all blood thinning medications  We have reviewed the indications for surgery, the associated risks, benefits and alternatives at length.  All questions today were answered.

## 2020-08-01 NOTE — Progress Notes (Signed)
Initial Nutrition Assessment  DOCUMENTATION CODES:   Not applicable  INTERVENTION:  Continue Ensure Enlive po BID, each supplement provides 350 kcal and 20 grams of protein.  Encourage adequate PO intake.   NUTRITION DIAGNOSIS:   Increased nutrient needs related to chronic illness (COPD) as evidenced by estimated needs.  GOAL:   Patient will meet greater than or equal to 90% of their needs  MONITOR:   PO intake, Supplement acceptance, Skin, Labs, I & O's, Weight trends  REASON FOR ASSESSMENT:   Malnutrition Screening Tool    ASSESSMENT:   64 y.o. female with medical history significant of COPD, essential hypertension, diabetes mellitus type 2 without complication, fatty liver, and sleep apnea presents with complaints of worsening back pain.  Patient had underwent L4-L5 decompression and fusion on 7/6. Patient is admitted for sepsis secondary to postoperative wound infection.  Pt reports having a good appetite currently and PTA with usual consumption of at least 3 meals a day with no difficulties. Meal completion has been 50-100%. Pt reports recent body weight of ~187 lbs. Noted, no new recorded. Recommend obtaining new weight to fully assess weight trends. Pt currently has Ensure ordered to aid in increased caloric and protein needs.   NUTRITION - FOCUSED PHYSICAL EXAM:    Most Recent Value  Orbital Region No depletion  Upper Arm Region No depletion  Thoracic and Lumbar Region No depletion  Buccal Region No depletion  Temple Region No depletion  Clavicle Bone Region No depletion  Clavicle and Acromion Bone Region No depletion  Scapular Bone Region No depletion  Dorsal Hand No depletion  Patellar Region No depletion  Anterior Thigh Region No depletion  Posterior Calf Region No depletion  Edema (RD Assessment) None  Hair Reviewed  Eyes Reviewed  Mouth Reviewed  Skin Reviewed  Nails Reviewed     Labs and medications reviewed.   Diet Order:   Diet Order             Diet NPO time specified  Diet effective midnight           Diet heart healthy/carb modified Room service appropriate? Yes with Assist; Fluid consistency: Thin  Diet effective now                 EDUCATION NEEDS:   Not appropriate for education at this time  Skin:  Skin Assessment: Skin Integrity Issues: Skin Integrity Issues:: Incisions Incisions: back  Last BM:  Unknown  Height:   Ht Readings from Last 1 Encounters:  07/23/20 5\' 6"  (1.676 m)    Weight:   Wt Readings from Last 1 Encounters:  07/23/20 90.7 kg    Estimated Nutritional Needs:   Kcal:  1800-2000  Protein:  85-100 grams  Fluid:  >/= 1.8 L/day  07/25/20, MS, RD, LDN RD pager number/after hours weekend pager number on Amion.

## 2020-08-01 NOTE — Anesthesia Preprocedure Evaluation (Addendum)
Anesthesia Evaluation  Patient identified by MRN, date of birth, ID band Patient awake    Reviewed: Allergy & Precautions, NPO status , Patient's Chart, lab work & pertinent test results  History of Anesthesia Complications Negative for: history of anesthetic complications  Airway Mallampati: III  TM Distance: >3 FB Neck ROM: Full    Dental  (+) Edentulous Upper, Edentulous Lower   Pulmonary sleep apnea , COPD, Current Smoker and Patient abstained from smoking.,    Pulmonary exam normal        Cardiovascular hypertension, Normal cardiovascular exam     Neuro/Psych negative neurological ROS  negative psych ROS   GI/Hepatic negative GI ROS, Neg liver ROS,   Endo/Other  diabetes, Type 2  Renal/GU Renal InsufficiencyRenal disease  negative genitourinary   Musculoskeletal negative musculoskeletal ROS (+)   Abdominal   Peds  Hematology  (+) anemia ,   Anesthesia Other Findings  COPD, OSA, HTN, DM2, CKD3, 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  Reproductive/Obstetrics                            Anesthesia Physical Anesthesia Plan  ASA: III and emergent  Anesthesia Plan: General   Post-op Pain Management:    Induction: Intravenous  PONV Risk Score and Plan: 3 and Ondansetron, Dexamethasone, Treatment may vary due to age or medical condition and Midazolam  Airway Management Planned: Oral ETT  Additional Equipment: None  Intra-op Plan:   Post-operative Plan: Extubation in OR  Informed Consent: I have reviewed the patients History and Physical, chart, labs and discussed the procedure including the risks, benefits and alternatives for the proposed anesthesia with the patient or authorized representative who has indicated his/her understanding and acceptance.     Dental advisory given  Plan Discussed with:   Anesthesia Plan Comments:       Anesthesia Quick  Evaluation

## 2020-08-01 NOTE — Progress Notes (Signed)
Pharmacy Antibiotic Note  Brenda Morgan is a 64 y.o. female admitted on 07/31/2020 with wound infection.  Pharmacy has been consulted for vancomycin dosing.   Patient had recent back surgery (7/6) and now reports increasing back pain, lethargy, and purulent discharge from her wound. Noted plans for I&D on 8/10.   The patient's SCr has improved and is down to 0.82, est CrCl~80 ml/min. Will adjust Vancomycin dose today.  Plan: - Adjust Vancomycin to 1g IV every 12 hours - Will continue to follow renal function, culture results, LOT, and antibiotic de-escalation plans   Temp (24hrs), Avg:98.2 F (36.8 C), Min:98 F (36.7 C), Max:98.5 F (36.9 C)  Recent Labs  Lab 07/31/20 1153 07/31/20 1558 07/31/20 2113 08/01/20 0542  WBC 19.0*  --   --  11.2*  CREATININE 1.17*  --   --  0.82  LATICACIDVEN 2.6* 1.5 1.4  --     Estimated Creatinine Clearance: 80.7 mL/min (by C-G formula based on SCr of 0.82 mg/dL).    Allergies  Allergen Reactions  . Amlodipine Anaphylaxis, Swelling and Other (See Comments)    Tongue swelling   . Naltrexone Hives  . Penicillins Hives and Other (See Comments)    Tolerated ceftriaxone and cefepime 06/2017 Hives      Antimicrobials this admission: Vancomycin 8/8 >>   Dose adjustments this admission: N/A  Microbiology results: 8/8 BCx x2: ngtd 8/9 MRSA PCR >> positive   Thank you for allowing pharmacy to be a part of this patient's care.  Georgina Pillion, PharmD, BCPS Clinical Pharmacist Clinical phone for 08/01/2020: [phone] 08/01/2020 2:19 PM   **Pharmacist phone directory can now be found on [email or web address] ([email or web address]).  Listed under [facility].

## 2020-08-02 ENCOUNTER — Inpatient Hospital Stay (HOSPITAL_COMMUNITY): Payer: BC Managed Care – PPO | Admitting: Anesthesiology

## 2020-08-02 ENCOUNTER — Encounter (HOSPITAL_COMMUNITY): Payer: Self-pay | Admitting: Internal Medicine

## 2020-08-02 ENCOUNTER — Inpatient Hospital Stay: Payer: Self-pay

## 2020-08-02 ENCOUNTER — Encounter (HOSPITAL_COMMUNITY)
Admission: EM | Disposition: A | Discharge status: Home Health | Payer: Self-pay | Source: Home / Self Care | Attending: Family Medicine

## 2020-08-02 DIAGNOSIS — T847XXA Infection and inflammatory reaction due to other internal orthopedic prosthetic devices, implants and grafts, initial encounter: Secondary | ICD-10-CM

## 2020-08-02 DIAGNOSIS — M4626 Osteomyelitis of vertebra, lumbar region: Secondary | ICD-10-CM

## 2020-08-02 HISTORY — PX: LUMBAR WOUND DEBRIDEMENT: SHX1988

## 2020-08-02 HISTORY — DX: Osteomyelitis of vertebra, lumbar region: M46.26

## 2020-08-02 LAB — CBC
HCT: 38 % (ref 36.0–46.0)
Hemoglobin: 12 g/dL (ref 12.0–15.0)
MCH: 28.9 pg (ref 26.0–34.0)
MCHC: 31.6 g/dL (ref 30.0–36.0)
MCV: 91.6 fL (ref 80.0–100.0)
Platelets: 232 10*3/uL (ref 150–400)
RBC: 4.15 MIL/uL (ref 3.87–5.11)
RDW: 14.7 % (ref 11.5–15.5)
WBC: 8.5 10*3/uL (ref 4.0–10.5)
nRBC: 0 % (ref 0.0–0.2)

## 2020-08-02 LAB — COMPREHENSIVE METABOLIC PANEL
ALT: 12 U/L (ref 0–44)
AST: 9 U/L — ABNORMAL LOW (ref 15–41)
Albumin: 2.7 g/dL — ABNORMAL LOW (ref 3.5–5.0)
Alkaline Phosphatase: 98 U/L (ref 38–126)
Anion gap: 10 (ref 5–15)
BUN: 12 mg/dL (ref 8–23)
CO2: 29 mmol/L (ref 22–32)
Calcium: 9.2 mg/dL (ref 8.9–10.3)
Chloride: 98 mmol/L (ref 98–111)
Creatinine, Ser: 0.76 mg/dL (ref 0.44–1.00)
GFR calc Af Amer: >60 mL/min (ref >60)
GFR calc non Af Amer: >60 mL/min (ref >60)
Glucose, Bld: 130 mg/dL — ABNORMAL HIGH (ref 70–99)
Potassium: 3.7 mmol/L (ref 3.5–5.1)
Sodium: 137 mmol/L (ref 135–145)
Total Bilirubin: 0.8 mg/dL (ref 0.3–1.2)
Total Protein: 6.3 g/dL — ABNORMAL LOW (ref 6.5–8.1)

## 2020-08-02 LAB — GLUCOSE, CAPILLARY
Glucose-Capillary: 145 mg/dL — ABNORMAL HIGH (ref 70–99)
Glucose-Capillary: 125 mg/dL — ABNORMAL HIGH (ref 70–99)
Glucose-Capillary: 133 mg/dL — ABNORMAL HIGH (ref 70–99)
Glucose-Capillary: 139 mg/dL — ABNORMAL HIGH (ref 70–99)
Glucose-Capillary: 219 mg/dL — ABNORMAL HIGH (ref 70–99)
Glucose-Capillary: 215 mg/dL — ABNORMAL HIGH (ref 70–99)

## 2020-08-02 LAB — MAGNESIUM: Magnesium: 1.6 mg/dL — ABNORMAL LOW (ref 1.7–2.4)

## 2020-08-02 LAB — PHOSPHORUS: Phosphorus: 3.3 mg/dL (ref 2.5–4.6)

## 2020-08-02 LAB — PROTIME-INR
INR: 1.1 (ref 0.8–1.2)
Prothrombin Time: 13.9 seconds (ref 11.4–15.2)

## 2020-08-02 LAB — APTT: aPTT: 34 seconds (ref 24–36)

## 2020-08-02 SURGERY — LUMBAR WOUND DEBRIDEMENT
Anesthesia: General | Site: Spine Lumbar

## 2020-08-02 MED ORDER — ONDANSETRON HCL 4 MG/2ML IJ SOLN
INTRAMUSCULAR | Status: DC | PRN
  Administered 2020-08-02: 4 mg via INTRAVENOUS

## 2020-08-02 MED ORDER — PHENOL 1.4 % MT LIQD: 1.0000 | OROMUCOSAL | Status: DC | PRN

## 2020-08-02 MED ORDER — FENTANYL CITRATE (PF) 100 MCG/2ML IJ SOLN
25.0000 ug | INTRAMUSCULAR | Status: DC | PRN
  Administered 2020-08-02: 50 ug via INTRAVENOUS

## 2020-08-02 MED ORDER — EPHEDRINE SULFATE 50 MG/ML IJ SOLN: INTRAMUSCULAR | Status: DC | PRN

## 2020-08-02 MED ORDER — THROMBIN 5000 UNITS EX SOLR
OROMUCOSAL | Status: DC | PRN
  Administered 2020-08-02: 5 mL via TOPICAL

## 2020-08-02 MED ORDER — EPHEDRINE SULFATE-NACL 50-0.9 MG/10ML-% IV SOSY
PREFILLED_SYRINGE | INTRAVENOUS | Status: DC | PRN
  Administered 2020-08-02 (×2): 5 mg via INTRAVENOUS

## 2020-08-02 MED ORDER — LACTATED RINGERS IV SOLN: INTRAVENOUS | Status: DC | PRN

## 2020-08-02 MED ORDER — SODIUM CHLORIDE 0.9% FLUSH
3.0000 mL | Freq: Two times a day (BID) | INTRAVENOUS | Status: DC
  Administered 2020-08-02 – 2020-08-05 (×5): 3 mL via INTRAVENOUS

## 2020-08-02 MED ORDER — SODIUM CHLORIDE 0.9 % IV SOLN
INTRAVENOUS | Status: DC | PRN
  Administered 2020-08-02: 3000 mL

## 2020-08-02 MED ORDER — FENTANYL CITRATE (PF) 250 MCG/5ML IJ SOLN
INTRAMUSCULAR | Status: AC
  Filled 2020-08-02: qty 5

## 2020-08-02 MED ORDER — DEXAMETHASONE SODIUM PHOSPHATE 10 MG/ML IJ SOLN
INTRAMUSCULAR | Status: DC | PRN
  Administered 2020-08-02: 10 mg via INTRAVENOUS

## 2020-08-02 MED ORDER — CHLORHEXIDINE GLUCONATE 0.12 % MT SOLN
15.0000 mL | Freq: Once | OROMUCOSAL | Status: AC
  Filled 2020-08-02: qty 15

## 2020-08-02 MED ORDER — PHENYLEPHRINE 40 MCG/ML (10ML) SYRINGE FOR IV PUSH (FOR BLOOD PRESSURE SUPPORT)
PREFILLED_SYRINGE | INTRAVENOUS | Status: AC
  Filled 2020-08-02: qty 10

## 2020-08-02 MED ORDER — AMISULPRIDE (ANTIEMETIC) 5 MG/2ML IV SOLN: 10.0000 mg | Freq: Once | INTRAVENOUS | Status: DC | PRN

## 2020-08-02 MED ORDER — SODIUM CHLORIDE 0.9 % IV SOLN: 250.0000 mL | INTRAVENOUS | Status: DC

## 2020-08-02 MED ORDER — MENTHOL 3 MG MT LOZG: 1.0000 | LOZENGE | OROMUCOSAL | Status: DC | PRN

## 2020-08-02 MED ORDER — PROPOFOL 10 MG/ML IV BOLUS
INTRAVENOUS | Status: DC | PRN
  Administered 2020-08-02: 150 mg via INTRAVENOUS

## 2020-08-02 MED ORDER — FENTANYL CITRATE (PF) 100 MCG/2ML IJ SOLN
INTRAMUSCULAR | Status: AC
  Administered 2020-08-02: 50 ug via INTRAVENOUS
  Filled 2020-08-02: qty 2

## 2020-08-02 MED ORDER — EPHEDRINE 5 MG/ML INJ
INTRAVENOUS | Status: AC
  Filled 2020-08-02: qty 10

## 2020-08-02 MED ORDER — ROCURONIUM BROMIDE 10 MG/ML (PF) SYRINGE
PREFILLED_SYRINGE | INTRAVENOUS | Status: DC | PRN
  Administered 2020-08-02: 60 mg via INTRAVENOUS

## 2020-08-02 MED ORDER — PHENYLEPHRINE 40 MCG/ML (10ML) SYRINGE FOR IV PUSH (FOR BLOOD PRESSURE SUPPORT)
PREFILLED_SYRINGE | INTRAVENOUS | Status: DC | PRN
  Administered 2020-08-02: 40 ug via INTRAVENOUS
  Administered 2020-08-02: 80 ug via INTRAVENOUS

## 2020-08-02 MED ORDER — ONDANSETRON HCL 4 MG/2ML IJ SOLN
INTRAMUSCULAR | Status: AC
  Filled 2020-08-02: qty 2

## 2020-08-02 MED ORDER — PROPOFOL 10 MG/ML IV BOLUS
INTRAVENOUS | Status: AC
  Filled 2020-08-02: qty 20

## 2020-08-02 MED ORDER — MIDAZOLAM HCL 5 MG/5ML IJ SOLN
INTRAMUSCULAR | Status: DC | PRN
  Administered 2020-08-02: 2 mg via INTRAVENOUS

## 2020-08-02 MED ORDER — CHLORHEXIDINE GLUCONATE CLOTH 2 % EX PADS: 6.0000 | MEDICATED_PAD | Freq: Once | CUTANEOUS | Status: AC

## 2020-08-02 MED ORDER — 0.9 % SODIUM CHLORIDE (POUR BTL) OPTIME
TOPICAL | Status: DC | PRN
  Administered 2020-08-02: 1000 mL

## 2020-08-02 MED ORDER — THROMBIN 5000 UNITS EX SOLR
CUTANEOUS | Status: AC
  Filled 2020-08-02: qty 5000

## 2020-08-02 MED ORDER — MIDAZOLAM HCL 2 MG/2ML IJ SOLN
INTRAMUSCULAR | Status: AC
  Filled 2020-08-02: qty 2

## 2020-08-02 MED ORDER — OXYCODONE HCL 5 MG PO TABS
ORAL_TABLET | ORAL | Status: AC
  Administered 2020-08-02: 5 mg via ORAL
  Filled 2020-08-02: qty 1

## 2020-08-02 MED ORDER — OXYCODONE HCL 5 MG/5ML PO SOLN: 5.0000 mg | Freq: Once | ORAL | Status: AC | PRN

## 2020-08-02 MED ORDER — OXYCODONE-ACETAMINOPHEN 5-325 MG PO TABS
1.0000 | ORAL_TABLET | ORAL | Status: DC | PRN
  Administered 2020-08-02 – 2020-08-05 (×9): 1 via ORAL
  Filled 2020-08-02 (×11): qty 1

## 2020-08-02 MED ORDER — HEPARIN SODIUM (PORCINE) 5000 UNIT/ML IJ SOLN
5000.0000 [IU] | Freq: Two times a day (BID) | INTRAMUSCULAR | Status: DC
  Administered 2020-08-03 – 2020-08-05 (×5): 5000 [IU] via SUBCUTANEOUS
  Filled 2020-08-02 (×5): qty 1

## 2020-08-02 MED ORDER — METHOCARBAMOL 500 MG PO TABS
500.0000 mg | ORAL_TABLET | Freq: Four times a day (QID) | ORAL | Status: DC | PRN
  Administered 2020-08-02 – 2020-08-05 (×5): 500 mg via ORAL
  Filled 2020-08-02 (×5): qty 1

## 2020-08-02 MED ORDER — METHOCARBAMOL 1000 MG/10ML IJ SOLN
500.0000 mg | Freq: Four times a day (QID) | INTRAVENOUS | Status: DC | PRN
  Administered 2020-08-05: 500 mg via INTRAVENOUS
  Filled 2020-08-02: qty 5

## 2020-08-02 MED ORDER — MORPHINE SULFATE (PF) 2 MG/ML IV SOLN
2.0000 mg | INTRAVENOUS | Status: DC | PRN
  Administered 2020-08-02 – 2020-08-05 (×5): 2 mg via INTRAVENOUS
  Filled 2020-08-02 (×5): qty 1

## 2020-08-02 MED ORDER — LIDOCAINE 2% (20 MG/ML) 5 ML SYRINGE
INTRAMUSCULAR | Status: AC
  Filled 2020-08-02: qty 5

## 2020-08-02 MED ORDER — SODIUM CHLORIDE 0.9% FLUSH
3.0000 mL | INTRAVENOUS | Status: DC | PRN
  Administered 2020-08-03 – 2020-08-05 (×2): 3 mL via INTRAVENOUS

## 2020-08-02 MED ORDER — VANCOMYCIN HCL IN DEXTROSE 1-5 GM/200ML-% IV SOLN: 1000.0000 mg | INTRAVENOUS | Status: DC

## 2020-08-02 MED ORDER — LIDOCAINE 2% (20 MG/ML) 5 ML SYRINGE
INTRAMUSCULAR | Status: DC | PRN
  Administered 2020-08-02: 60 mg via INTRAVENOUS

## 2020-08-02 MED ORDER — MAGNESIUM SULFATE 2 GM/50ML IV SOLN
2.0000 g | Freq: Once | INTRAVENOUS | Status: AC
  Administered 2020-08-02: 2 g via INTRAVENOUS
  Filled 2020-08-02: qty 50

## 2020-08-02 MED ORDER — SUGAMMADEX SODIUM 200 MG/2ML IV SOLN
INTRAVENOUS | Status: DC | PRN
  Administered 2020-08-02: 200 mg via INTRAVENOUS

## 2020-08-02 MED ORDER — OXYCODONE HCL 5 MG PO TABS: 5.0000 mg | ORAL_TABLET | Freq: Once | ORAL | Status: AC | PRN

## 2020-08-02 MED ORDER — RIFABUTIN 150 MG PO CAPS: 300.0000 mg | ORAL_CAPSULE | Freq: Every day | ORAL | 0 refills | Status: DC

## 2020-08-02 MED ORDER — LACTATED RINGERS IV SOLN: INTRAVENOUS | Status: DC

## 2020-08-02 MED ORDER — CHLORHEXIDINE GLUCONATE 0.12 % MT SOLN
OROMUCOSAL | Status: AC
  Administered 2020-08-02: 15 mL via OROMUCOSAL
  Filled 2020-08-02: qty 15

## 2020-08-02 MED ORDER — ONDANSETRON HCL 4 MG/2ML IJ SOLN: 4.0000 mg | Freq: Once | INTRAMUSCULAR | Status: DC | PRN

## 2020-08-02 MED ORDER — SODIUM CHLORIDE 0.9 % IV SOLN
INTRAVENOUS | Status: AC
  Filled 2020-08-02 (×3): qty 1000

## 2020-08-02 MED ORDER — FENTANYL CITRATE (PF) 250 MCG/5ML IJ SOLN
INTRAMUSCULAR | Status: DC | PRN
  Administered 2020-08-02: 100 ug via INTRAVENOUS

## 2020-08-02 MED FILL — RIFABUTIN 150 MG CAPSULE: 150 | 30 days supply | Qty: 60 | Fill #0

## 2020-08-02 SURGICAL SUPPLY — 58 items
BAG BANDED W/RUBBER/TAPE 36X54 (MISCELLANEOUS) IMPLANT
BAG EQP BAND 135X91 W/RBR TAPE (MISCELLANEOUS)
BUR 2.5 MTCH HD 16 (BUR) IMPLANT
BUR 2.5MM MTCH HD 16CM (BUR)
CANISTER WOUNDNEG PRESSURE 500 (CANNISTER) ×3 IMPLANT
CARTRIDGE OIL MAESTRO DRILL (MISCELLANEOUS) IMPLANT
CNTNR URN SCR LID CUP LEK RST (MISCELLANEOUS) IMPLANT
CONT SPEC 4OZ STRL OR WHT (MISCELLANEOUS)
COVER BACK TABLE 60X90IN (DRAPES) IMPLANT
COVER MAYO STAND STRL (DRAPES) IMPLANT
COVER WAND RF STERILE (DRAPES) ×3 IMPLANT
DECANTER SPIKE VIAL GLASS SM (MISCELLANEOUS) IMPLANT
DIFFUSER DRILL AIR PNEUMATIC (MISCELLANEOUS) IMPLANT
DRAPE C-ARMOR (DRAPES) IMPLANT
DRAPE LAPAROTOMY 100X72X124 (DRAPES) ×3 IMPLANT
DRAPE SURG 17X23 STRL (DRAPES) ×3 IMPLANT
DRSG TRANSPARENT 2.4X2.8 (GAUZE/BANDAGES/DRESSINGS) ×6 IMPLANT
DRSG VAC ATS MED SENSATRAC (GAUZE/BANDAGES/DRESSINGS) ×3 IMPLANT
DURAPREP 26ML APPLICATOR (WOUND CARE) ×3 IMPLANT
ELECT COATED BLADE 2.86 ST (ELECTRODE) IMPLANT
ELECT REM PT RETURN 9FT ADLT (ELECTROSURGICAL) ×3
ELECTRODE REM PT RTRN 9FT ADLT (ELECTROSURGICAL) ×1 IMPLANT
GAUZE 4X4 16PLY RFD (DISPOSABLE) IMPLANT
GAUZE SPONGE 4X4 12PLY STRL (GAUZE/BANDAGES/DRESSINGS) IMPLANT
GLOVE BIOGEL PI IND STRL 8 (GLOVE) ×3 IMPLANT
GLOVE BIOGEL PI INDICATOR 8 (GLOVE) ×6
GLOVE ECLIPSE 7.5 STRL STRAW (GLOVE) ×9 IMPLANT
GLOVE ECLIPSE 8.0 STRL XLNG CF (GLOVE) ×6 IMPLANT
GOWN STRL REUS W/ TWL LRG LVL3 (GOWN DISPOSABLE) IMPLANT
GOWN STRL REUS W/ TWL XL LVL3 (GOWN DISPOSABLE) ×1 IMPLANT
GOWN STRL REUS W/TWL 2XL LVL3 (GOWN DISPOSABLE) ×3 IMPLANT
GOWN STRL REUS W/TWL LRG LVL3 (GOWN DISPOSABLE)
GOWN STRL REUS W/TWL XL LVL3 (GOWN DISPOSABLE) ×3
HANDPIECE INTERPULSE COAX TIP (DISPOSABLE) ×3
KIT BASIN OR (CUSTOM PROCEDURE TRAY) ×3 IMPLANT
KIT POSITION SURG JACKSON T1 (MISCELLANEOUS) ×3 IMPLANT
KIT TURNOVER KIT B (KITS) ×3 IMPLANT
MARKER SKIN DUAL TIP RULER LAB (MISCELLANEOUS) ×3 IMPLANT
NEEDLE HYPO 25X1 1.5 SAFETY (NEEDLE) IMPLANT
NS IRRIG 1000ML POUR BTL (IV SOLUTION) ×3 IMPLANT
OIL CARTRIDGE MAESTRO DRILL (MISCELLANEOUS)
PACK LAMINECTOMY NEURO (CUSTOM PROCEDURE TRAY) ×3 IMPLANT
PAD ARMBOARD 7.5X6 YLW CONV (MISCELLANEOUS) ×9 IMPLANT
PAD NEG PRESSURE SENSATRAC (MISCELLANEOUS) ×3 IMPLANT
PATTIES SURGICAL .5 X.5 (GAUZE/BANDAGES/DRESSINGS) IMPLANT
PATTIES SURGICAL .5 X1 (DISPOSABLE) IMPLANT
PATTIES SURGICAL 1X1 (DISPOSABLE) IMPLANT
SET HNDPC FAN SPRY TIP SCT (DISPOSABLE) ×1 IMPLANT
SPONGE LAP 4X18 RFD (DISPOSABLE) IMPLANT
STAPLER VISISTAT 35W (STAPLE) IMPLANT
SUT VIC AB 0 CT1 27 (SUTURE) ×3
SUT VIC AB 0 CT1 27XBRD ANBCTR (SUTURE) ×1 IMPLANT
SUT VIC AB 2-0 CP2 18 (SUTURE) ×3 IMPLANT
SUT VIC AB 3-0 SH 8-18 (SUTURE) IMPLANT
TOWEL GREEN STERILE (TOWEL DISPOSABLE) ×3 IMPLANT
TOWEL GREEN STERILE FF (TOWEL DISPOSABLE) ×3 IMPLANT
TRAY FOLEY MTR SLVR 16FR STAT (SET/KITS/TRAYS/PACK) IMPLANT
WATER STERILE IRR 1000ML POUR (IV SOLUTION) ×3 IMPLANT

## 2020-08-02 NOTE — Progress Notes (Signed)
PROGRESS NOTE    Brenda Morgan  VWU:[id number] DOB: December 31, 1955 DOA: 07/31/2020   PCP: Patient, No Pcp Per   Brief Narrative:  Brenda Morgan is a 64 y.o. female with medical history significant of COPD, essential hypertension, diabetes mellitus type 2 without complication, fatty liver, and sleep apnea presents with complaints of worsening back pain.  Patient had underwent L4-L5 decompression and fusion on 7/6 by Dr. Conchita Paris.[name]  She was readmitted for postoperative back pain with acute kidney injury from 7/14-7/20 treated with steroids and PT/OT.  Seen at urgent care on 7/31 for worsening pain after reporting drainage from the wound site.  She was given Rocephin and started on doxycycline at that time.  She was seen by Dr. Nundkumar 3 days ago and the wound appeared to be getting better.  He started her on a 10-day course of Bactrim, but wanted to get a MRI lumbar spine for further evaluation.  However, patient was noted to be more lethargic with worsening back pain over the last day. Purulent drainage and redness was noted around the surgical site.  She complains of decreased sensation on the left leg and was globally weak. Patient denies any fever or recent falls. Dr. Theola Sequin neurosurgery was formally consulted.    Patient is admitted for sepsis secondary to postoperative wound infection.  Patient is started on IV antibiotics.  Patient underwent washout and wound VAC placement on 8/10, tolerated well.  Patient may require wound closure pending wound status on Friday or Monday.  Assessment & Plan:   Principal Problem:   Postoperative wound infection Active Problems:   Postoperative back pain   COPD (chronic obstructive pulmonary disease) (HCC)   DM II (diabetes mellitus, type II), controlled (HCC)   HTN (hypertension)   Sepsis secondary to postoperative wound infection: Acute.   Patient was noted to be tachycardic and tachypneic with WBC elevated at 19 and lactic acid initially 2.6.     Lumbar spine noted to be likely source of infection with purulent drainage discharge reported.   Patient reporting decrease in sensation on the right lower extremity. -Admitted to telemetry -Sepsis protocol initiated. - Blood cultures : No growth so far -MRI of the lumbar spine : Marrow edema within L4-L5 vertebral body -Continue empiric antibiotics of vancomycin -  UA , CXR : unremarkable. -2 L of lactated Ringer's given -Neurosurgery consulted .  Plan: Washout and placement of wound VAC on Tuesday. -Patient may require wound closure pending wound status on Friday or Monday.   Diabetes mellitus type 2:  Last hemoglobin A1c 6.7 on 06/24/2020.   Blood glucose appears to be relatively well controlled.   Home medications include NovoLog 70/30 mix 65 units 2 times daily, Smaglutide 1 mg every Sunday, and Empagliflozin-Metformin 25-1000 mg daily. -Hypoglycemic protocol -CBGs before every meal with sensitive SSI  -Reduced 70/30 mix to 30 units twice daily  Essential hypertension:  Blood pressures currently relatively well controlled. -Continue Coreg -Will need to restart rest of patient's home regimen as tolerated when medically appropriate.  Chronic kidney disease stage IIIa:  Creatinine mildly elevated at 1.17 with BUN 24 to suggest patient dehydrated.   Baseline creatinine appears to be around 1. -Recheck kidney function in a.m.  Hyperlipidemia -Continue atorvastatin    DVT prophylaxis: Lovenox Code Status: Full Family Communication: Husband updated at bedside. Disposition Plan: To be determined Consults called: Neurosurgery Admission status: Inpatient. Patient is not medically clear, requiring wound care and IV antibiotic.   Consultants:    Neurosurgery  Procedures:  Antimicrobials:  Anti-infectives (From admission, onward)   Start     Dose/Rate Route Frequency Ordered Stop   08/02/20 1216  bacitracin 50,000 Units in sodium chloride 0.9 % 500 mL irrigation   Status:  Discontinued          As needed 08/02/20 1216 08/02/20 1251   08/02/20 0830  sodium chloride 0.9 % 1,000 mL with bacitracin 100,000 Units infusion     Discontinue     10 mL/hr  Irrigation To Surgery 08/02/20 0829 08/03/20 0830   08/02/20 0700  vancomycin (VANCOCIN) IVPB 1000 mg/200 mL premix  Status:  Discontinued        1,000 mg 200 mL/hr over 60 Minutes Intravenous On call to O.R. 08/02/20 0602 08/02/20 1326   08/01/20 1600  vancomycin (VANCOCIN) IVPB 1000 mg/200 mL premix     Discontinue     1,000 mg 200 mL/hr over 60 Minutes Intravenous Every 12 hours 08/01/20 1417     08/01/20 0400  vancomycin (VANCOREADY) IVPB 750 mg/150 mL  Status:  Discontinued        750 mg 150 mL/hr over 60 Minutes Intravenous Every 12 hours 07/31/20 1559 08/01/20 1417   07/31/20 1600  vancomycin (VANCOREADY) IVPB 500 mg/100 mL        500 mg 100 mL/hr over 60 Minutes Intravenous  Once 07/31/20 1559 07/31/20 1833   07/31/20 1400  vancomycin (VANCOCIN) IVPB 1000 mg/200 mL premix        1,000 mg 200 mL/hr over 60 Minutes Intravenous  Once 07/31/20 1357 07/31/20 1543      Subjective: Patient was seen and examined at bedside.  No overnight events.  Patient reports pain is not controlled.  Son was at bedside,  all questions answered.  Patient is a s/p wash and wound VAC placement. Objective: Vitals:   08/02/20 0730 08/02/20 1255 08/02/20 1310 08/02/20 1335  BP: 99/66 (!) 145/65 135/80 (!) 124/94  Pulse: 68 72 69 62  Resp: 20 11 10 14   Temp: 98.6 F (37 C) 98.4 F (36.9 C) 98.6 F (37 C) (!) 97.5 F (36.4 C)  TempSrc: Oral   Oral  SpO2: 95% 100% 100% (!) 89%    Intake/Output Summary (Last 24 hours) at 08/02/2020 1341 Last data filed at 08/02/2020 1255 Gross per 24 hour  Intake 840 ml  Output 350 ml  Net 490 ml   There were no vitals filed for this visit.  Examination:  General exam: Appears calm and comfortable. Respiratory system: Clear to auscultation. Respiratory effort  normal. Cardiovascular system: S1 & S2 heard, RRR. No JVD, murmurs, rubs, gallops or clicks. No pedal edema. Gastrointestinal system: Abdomen is nondistended, soft and nontender. No organomegaly or masses felt. Normal bowel sounds heard. Central nervous system: Alert and oriented. No focal neurological deficits. Extremities: No leg swelling, no cyanosis, no edema. Skin: No rashes, lesions or ulcers Psychiatry: Judgement and insight appear normal. Mood & affect appropriate.     Data Reviewed: I have personally reviewed following labs and imaging studies  CBC: Recent Labs  Lab 07/31/20 1153 08/01/20 0542 08/02/20 0413  WBC 19.0* 11.2* 8.5  NEUTROABS 14.4*  --   --   HGB 13.3 11.3* 12.0  HCT 43.0 35.5* 38.0  MCV 94.1 92.4 91.6  PLT 256 187 232   Basic Metabolic Panel: Recent Labs  Lab 07/31/20 1153 08/01/20 0542 08/02/20 0413  NA 140 137 137  K 3.7 3.4* 3.7  CL 95* 96* 98  CO2 29 28 29   GLUCOSE  163* 108* 130*  BUN 24* 14 12  CREATININE 1.17* 0.82 0.76  CALCIUM 9.8 8.9 9.2  MG  --   --  1.6*  PHOS  --   --  3.3   GFR: Estimated Creatinine Clearance: 82.8 mL/min (by C-G formula based on SCr of 0.76 mg/dL). Liver Function Tests: Recent Labs  Lab 07/31/20 1153 08/02/20 0413  AST 15 9*  ALT 13 12  ALKPHOS 131* 98  BILITOT 0.5 0.8  PROT 7.5 6.3*  ALBUMIN 3.3* 2.7*   No results for input(s): LIPASE, AMYLASE in the last 168 hours. No results for input(s): AMMONIA in the last 168 hours. Coagulation Profile: Recent Labs  Lab 08/02/20 1022  INR 1.1   Cardiac Enzymes: No results for input(s): CKTOTAL, CKMB, CKMBINDEX, TROPONINI in the last 168 hours. BNP (last 3 results) No results for input(s): PROBNP in the last 8760 hours. HbA1C: No results for input(s): HGBA1C in the last 72 hours. CBG: Recent Labs  Lab 08/01/20 1834 08/01/20 2101 08/02/20 0729 08/02/20 0937 08/02/20 1255  GLUCAP 243* 155* 125* 133* 139*   Lipid Profile: No results for input(s):  CHOL, HDL, LDLCALC, TRIG, CHOLHDL, LDLDIRECT in the last 72 hours. Thyroid Function Tests: No results for input(s): TSH, T4TOTAL, FREET4, T3FREE, THYROIDAB in the last 72 hours. Anemia Panel: No results for input(s): VITAMINB12, FOLATE, FERRITIN, TIBC, IRON, RETICCTPCT in the last 72 hours. Sepsis Labs: Recent Labs  Lab 07/31/20 1153 07/31/20 1558 07/31/20 2113  LATICACIDVEN 2.6* 1.5 1.4    Recent Results (from the past 240 hour(s))  Blood culture (routine x 2)     Status: None (Preliminary result)   Collection Time: 07/31/20 11:57 AM   Specimen: BLOOD RIGHT HAND  Result Value Ref Range Status   Specimen Description BLOOD RIGHT HAND  Final   Special Requests   Final    BOTTLES DRAWN AEROBIC AND ANAEROBIC Blood Culture adequate volume   Culture   Final    NO GROWTH 2 DAYS Performed at [facility], [address] [place], [place] [address]    Report Status PENDING  Incomplete  Blood culture (routine x 2)     Status: None (Preliminary result)   Collection Time: 07/31/20  2:18 PM   Specimen: BLOOD  Result Value Ref Range Status   Specimen Description BLOOD RIGHT ANTECUBITAL  Final   Special Requests   Final    BOTTLES DRAWN AEROBIC AND ANAEROBIC Blood Culture results may not be optimal due to an inadequate volume of blood received in culture bottles   Culture   Final    NO GROWTH 2 DAYS Performed at [facility], [address], [place], [place] [address]    Report Status PENDING  Incomplete  SARS Coronavirus 2 by RT PCR (hospital order, performed in [facility] hospital lab) Nasopharyngeal Nasopharyngeal Swab     Status: None   Collection Time: 07/31/20  4:09 PM   Specimen: Nasopharyngeal Swab  Result Value Ref Range Status   SARS Coronavirus 2 NEGATIVE NEGATIVE Final    Comment: (NOTE) SARS-CoV-2 target nucleic acids are NOT DETECTED.  The SARS-CoV-2 RNA is generally detectable in upper and lower respiratory specimens during the acute phase of infection.  The lowest concentration of SARS-CoV-2 viral copies this assay can detect is 250 copies / mL. A negative result does not preclude SARS-CoV-2 infection and should not be used as the sole basis for treatment or other patient management decisions.  A negative result may occur with  improper specimen collection / handling, submission of specimen other than nasopharyngeal swab, presence of viral mutation(s) within the areas targeted by this assay, and inadequate number of viral copies (<250 copies / mL). A negative result must be combined with clinical observations, patient history, and epidemiological information.  Fact Sheet for Patients:   BoilerBrush.com.cy  Fact Sheet for Healthcare Providers: https://pope.com/  This test is not yet approved or  cleared by the [place] FDA and has been authorized for detection and/or diagnosis of SARS-CoV-2 by FDA under an Emergency Use Authorization (EUA).  This EUA will remain in effect (meaning this test can be used) for the duration of the COVID-19 declaration under Section 564(b)(1) of the Act, 21 U.S.C. section 360bbb-3(b)(1), unless the authorization is terminated or revoked sooner.  Performed at [facility], [address], [place], [place] [address]   MRSA PCR Screening     Status: Abnormal   Collection Time: 08/01/20  7:16 AM   Specimen: Nasal Mucosa; Nasopharyngeal  Result Value Ref Range Status   MRSA by PCR POSITIVE (A) NEGATIVE Final    Comment:        The GeneXpert MRSA Assay (FDA approved for NASAL specimens only), is one component of a comprehensive MRSA colonization surveillance program. It is not intended to diagnose MRSA infection nor to guide or monitor treatment for MRSA infections. RESULT CALLED TO, READ BACK BY AND VERIFIED WITH: A. Lavway RN 10:50 08/01/20 ([email or web address]) Performed at [facility], [address], [place], [place] [address]      Radiology  Studies: MR LUMBAR SPINE WO CONTRAST  Result Date: 07/31/2020 CLINICAL DATA:  Lower back pain, recent surgery EXAM: MRI LUMBAR SPINE WITHOUT CONTRAST TECHNIQUE: Multiplanar, multisequence MR imaging of the lumbar spine was performed. No intravenous contrast was administered. COMPARISON:  Radiograph July 07, 2020 FINDINGS: Segmentation: There are 5 non-rib bearing lumbar type vertebral bodies with the last intervertebral disc space labeled as L5-S1. Alignment:  There is a minimal anterolisthesis of L4 on L5. Vertebrae: The patient is status post decompression at L4-L5 with posterior fixation with surrounding metallic artifact. There is an interbody fixation device seen at L4-L5 which is not well evaluated on this exam. Mildly increased marrow edema seen within the L4 and L5 vertebral bodies. There also appears to be slight superior endplate compression deformity of the L5 vertebral body with less than 25% loss in height. Conus medullaris and cauda equina: Conus extends to the L1 level. Conus and cauda equina appear normal. Paraspinal and other soft tissues: Postsurgical changes are seen at L4-L5 with subcutaneous edema and fatty atrophy of the paraspinal musculature. There is overlying skin defect with a surgical tract, with possible small amount of fluid seen within the surgical tract. However no definite large loculated fluid collections are seen. Disc levels: T12-L1:  No significant canal or neural foraminal narrowing. L1-L2:   No significant canal or neural foraminal narrowing. L2-L3: Facet arthrosis is present which causes mild bilateral neural foraminal narrowing. L3-L4: There is a broad-based disc bulge and facet arthrosis which causes moderate bilateral neural foraminal narrowing. L4-L5: Limited evaluation due to artifact, however facet arthrosis is present. L5-S1: There is a broad-based disc bulge and facet arthrosis which causes moderate bilateral neural foraminal narrowing. IMPRESSION: 1. Status post  decompression and fixation at L4-L5 with surrounding metallic artifact. There does appear to be marrow edema within the L4 and L5 vertebral body. 2. mild superior endplate compression of the L5 vertebral body with the interbody  fixation device not well evaluated. If further evaluation is required would recommend CT. 3. Overlying postsurgical changes at L4-L5 with a surgical tract with a small amount of fluid within the tract. However no loculated fluid collections are noted. Electronically Signed   By: Jonna ClarkBindu  Avutu M.D.   On: 07/31/2020 19:30   DG CHEST PORT 1 VIEW  Result Date: 07/31/2020 CLINICAL DATA:  Shortness of breath EXAM: PORTABLE CHEST 1 VIEW COMPARISON:  None. FINDINGS: Heart and mediastinal contours are within normal limits. No focal opacities or effusions. No acute bony abnormality. IMPRESSION: No active disease. Electronically Signed   By: Charlett NoseKevin  Dover M.D.   On: 07/31/2020 17:43    Scheduled Meds: . Chlorhexidine Gluconate Cloth  6 each Topical Q0600  . feeding supplement (ENSURE ENLIVE)  237 mL Oral BID BM  . insulin aspart  0-15 Units Subcutaneous TID WC  . insulin aspart  0-5 Units Subcutaneous QHS  . mupirocin ointment  1 application Nasal BID  . sodium chloride flush  3 mL Intravenous Once  . sodium chloride flush  3 mL Intravenous Q12H  . sodium chloride flush  3 mL Intravenous Q12H   Continuous Infusions: . lactated ringers 10 mL/hr at 08/02/20 0844  . sodium chloride 0.9 % 1,000 mL with bacitracin 100,000 Units infusion    . vancomycin 1,000 mg (08/02/20 0401)     LOS: 2 days    Time spent: 25 mins.   Cipriano BunkerPARDEEP Gianni Fuchs, MD [facility]   If 7PM-7AM, please contact night-coverage

## 2020-08-02 NOTE — Consult Note (Signed)
[facility]    Date of Admission:  07/31/2020     Total days of antibiotics 3  Vancomycin day 3              Reason for Consult: Vertebral Post-Op infection with hardware   Referring Provider: Dawley  Primary Care Provider: Patient, No Pcp Per     Assessment: Brenda Morgan is a 64 y.o. female with early post op wound infection following L4-L5 posterior interbody fusion 7/06. She seemed to have some improvement on anti-MRSA coverage with doxycycline and then bactrim. Received IV Vancomycin pre op for 48h prior to collecting specimens which may render them sterile - will continue IV vancomycin with presumed staph aureus / coag negative staph. Likely will need to consider addition of rifampin - will review med list and await intraoperative cultures.   Does not sound to have been bacteremic in discussion of patient and reading her chart - likely OK for PICC in the next 24h. Discussed this briefly.  Previously were     Plan: 1. Continue IV vancomycin  2. Plan for PICC line in AM 08/03/20 3. Follow gram stain from operative cultures for further guidance    Principal Problem:   Postoperative wound infection Active Problems:   Infection of lumbar spine (HCC)   Hardware complicating wound infection (HCC)   Postoperative back pain   COPD (chronic obstructive pulmonary disease) (HCC)   DM II (diabetes mellitus, type II), controlled (HCC)   HTN (hypertension)   . Chlorhexidine Gluconate Cloth  6 each Topical Q0600  . feeding supplement (ENSURE ENLIVE)  237 mL Oral BID BM  . [START ON 08/03/2020] heparin injection (subcutaneous)  5,000 Units Subcutaneous Q12H  . insulin aspart  0-15 Units Subcutaneous TID WC  . insulin aspart  0-5 Units Subcutaneous QHS  . mupirocin ointment  1 application Nasal BID  . sodium chloride flush  3 mL Intravenous Once  . sodium chloride flush  3 mL Intravenous Q12H  . sodium chloride flush  3 mL Intravenous Q12H     HPI: Brenda Morgan is a 64 y.o. female admitted for elective debridement of lumbar spine wound.   She underwent lumbar fusion 7/06 with Dr. Conchita Paris. Struggled post op with wound drainage and increasing back pain requiring admission to the hospital 7/14 - 7/20. She was given steroids with good improvement in pain at this time. She was discharged to her home with husband.   Urgent care visit on 07/23/20 with concern for wound drainage and increased pain from L-spine incision after trip to [place]. Denied any fevers, chills or systemic symptoms at that visit. Was given 1gm IV ceftriaxone and sent home with doxycycline BID. Changed to Bactrim I presume at her NSGY office which she was taking up until admission on 8/08 - wound apeared to be getting better per patient's report but she is not 100% certain.   Seen in the office by Dr. Conchita Paris and there was concern over acute confusion and left leg weakness/decreased sensation. She was sent to ER for evaluation. WBC 19K, SCr 1.17, Lactic acid 2.6. Started on IV Vancomycin and given IVF and admitted for surgery.   Speaking with Dr. Jake Samples he stated there was no overt purulence but turbid-appearing fluid that tracked down below fascia to hardware. Intraoperative cultures were taken and pending.    Review of Systems: Review of Systems  Constitutional: Negative for chills and fever.  Respiratory: Negative for cough and  shortness of breath.   Cardiovascular: Positive for leg swelling.  Genitourinary: Negative for dysuria.  Musculoskeletal: Positive for back pain. Negative for falls, joint pain and myalgias.  Neurological: Positive for focal weakness (LLE ). Negative for dizziness and headaches.    Past Medical History:  Diagnosis Date  . COPD (chronic obstructive pulmonary disease) (HCC)   . Diabetes mellitus without complication (HCC)   . Fatty liver 2012   per pt 06/24/20  . Sleep apnea     Social History   Tobacco Use  . Smoking  status: Current Every Day Smoker    Packs/day: 0.50    Types: Cigarettes  . Smokeless tobacco: Never Used  Substance Use Topics  . Alcohol use: Not Currently  . Drug use: Not Currently    History reviewed. No pertinent family history. Allergies  Allergen Reactions  . Amlodipine Anaphylaxis, Swelling and Other (See Comments)    Tongue swelling   . Naltrexone Hives  . Penicillins Hives and Other (See Comments)    Tolerated ceftriaxone and cefepime 06/2017 Hives      OBJECTIVE: Blood pressure (!) 124/94, pulse 62, temperature (!) 97.5 F (36.4 C), temperature source Oral, resp. rate 14, SpO2 (!) 89 %.  Physical Exam Vitals reviewed.  Constitutional:      Appearance: She is well-developed.     Comments: Resting quietly in bed. No distress.   HENT:     Mouth/Throat:     Mouth: No oral lesions.     Dentition: Normal dentition. No dental abscesses.     Pharynx: Oropharyngeal exudate present.  Cardiovascular:     Rate and Rhythm: Normal rate and regular rhythm.     Heart sounds: Murmur (systolic @ apex, 3-2/4) heard.   Pulmonary:     Effort: Pulmonary effort is normal.     Breath sounds: Normal breath sounds.  Abdominal:     General: There is no distension.     Palpations: Abdomen is soft.     Tenderness: There is no abdominal tenderness.  Musculoskeletal:     Comments: Wound vac in place with good seal.  Lymphadenopathy:     Cervical: No cervical adenopathy.  Skin:    General: Skin is warm and dry.     Findings: No rash.  Neurological:     Mental Status: She is alert and oriented to person, place, and time.  Psychiatric:        Judgment: Judgment normal.     Comments: Appears a bit confused over medical care at times     Lab Results Lab Results  Component Value Date   WBC 8.5 08/02/2020   HGB 12.0 08/02/2020   HCT 38.0 08/02/2020   MCV 91.6 08/02/2020   PLT 232 08/02/2020    Lab Results  Component Value Date   CREATININE 0.76 08/02/2020   BUN 12  08/02/2020   NA 137 08/02/2020   K 3.7 08/02/2020   CL 98 08/02/2020   CO2 29 08/02/2020    Lab Results  Component Value Date   ALT 12 08/02/2020   AST 9 (L) 08/02/2020   ALKPHOS 98 08/02/2020   BILITOT 0.8 08/02/2020     Microbiology: Recent Results (from the past 240 hour(s))  Blood culture (routine x 2)     Status: None (Preliminary result)   Collection Time: 07/31/20 11:57 AM   Specimen: BLOOD RIGHT HAND  Result Value Ref Range Status   Specimen Description BLOOD RIGHT HAND  Final   Special Requests   Final  BOTTLES DRAWN AEROBIC AND ANAEROBIC Blood Culture adequate volume   Culture   Final    NO GROWTH 2 DAYS Performed at [facility], [address], [place], [place] [address]    Report Status PENDING  Incomplete  Blood culture (routine x 2)     Status: None (Preliminary result)   Collection Time: 07/31/20  2:18 PM   Specimen: BLOOD  Result Value Ref Range Status   Specimen Description BLOOD RIGHT ANTECUBITAL  Final   Special Requests   Final    BOTTLES DRAWN AEROBIC AND ANAEROBIC Blood Culture results may not be optimal due to an inadequate volume of blood received in culture bottles   Culture   Final    NO GROWTH 2 DAYS Performed at [facility], [address] [place], [place] [address]    Report Status PENDING  Incomplete  SARS Coronavirus 2 by RT PCR (hospital order, performed in [facility] hospital lab) Nasopharyngeal Nasopharyngeal Swab     Status: None   Collection Time: 07/31/20  4:09 PM   Specimen: Nasopharyngeal Swab  Result Value Ref Range Status   SARS Coronavirus 2 NEGATIVE NEGATIVE Final    Comment: (NOTE) SARS-CoV-2 target nucleic acids are NOT DETECTED.  The SARS-CoV-2 RNA is generally detectable in upper and lower respiratory specimens during the acute phase of infection. The lowest concentration of SARS-CoV-2 viral copies this assay can detect is 250 copies / mL. A negative result does not preclude SARS-CoV-2 infection and  should not be used as the sole basis for treatment or other patient management decisions.  A negative result may occur with improper specimen collection / handling, submission of specimen other than nasopharyngeal swab, presence of viral mutation(s) within the areas targeted by this assay, and inadequate number of viral copies (<250 copies / mL). A negative result must be combined with clinical observations, patient history, and epidemiological information.  Fact Sheet for Patients:   BoilerBrush.com.cy  Fact Sheet for Healthcare Providers: https://pope.com/  This test is not yet approved or  cleared by the [place] FDA and has been authorized for detection and/or diagnosis of SARS-CoV-2 by FDA under an Emergency Use Authorization (EUA).  This EUA will remain in effect (meaning this test can be used) for the duration of the COVID-19 declaration under Section 564(b)(1) of the Act, 21 U.S.C. section 360bbb-3(b)(1), unless the authorization is terminated or revoked sooner.  Performed at [facility], [address], [place], [place] [address]   MRSA PCR Screening     Status: Abnormal   Collection Time: 08/01/20  7:16 AM   Specimen: Nasal Mucosa; Nasopharyngeal  Result Value Ref Range Status   MRSA by PCR POSITIVE (A) NEGATIVE Final    Comment:        The GeneXpert MRSA Assay (FDA approved for NASAL specimens only), is one component of a comprehensive MRSA colonization surveillance program. It is not intended to diagnose MRSA infection nor to guide or monitor treatment for MRSA infections. RESULT CALLED TO, READ BACK BY AND VERIFIED WITH: A. Lavway RN 10:50 08/01/20 ([email or web address]) Performed at [facility], [address], [place], [place] [address]     Brenda Alberts, MSN, NP-C [facility]  [email or web address] Pager: [phone] Office:  [phone] [facility] Main Line: [phone]

## 2020-08-02 NOTE — Anesthesia Postprocedure Evaluation (Signed)
Anesthesia Post Note  Patient: Lexicographer  Procedure(s) Performed: IRRIGATE AND DEBRIDEMENT OF LUMBAR WOUND, PLACEMENT OF WOUND VAC (N/A Spine Lumbar)     Patient location during evaluation: PACU Anesthesia Type: General Level of consciousness: awake and alert Pain management: pain level controlled Vital Signs Assessment: post-procedure vital signs reviewed and stable Respiratory status: spontaneous breathing, nonlabored ventilation and respiratory function stable Cardiovascular status: blood pressure returned to baseline and stable Postop Assessment: no apparent nausea or vomiting Anesthetic complications: no   No complications documented.  Last Vitals:  Vitals:   08/02/20 1310 08/02/20 1335  BP: 135/80 (!) 124/94  Pulse: 69 62  Resp: 10 14  Temp: 37 C (!) 36.4 C  SpO2: 100% (!) 89%    Last Pain:  Vitals:   08/02/20 1500  TempSrc:   PainSc: 10-Worst pain ever                 Lucretia Kern

## 2020-08-02 NOTE — Anesthesia Procedure Notes (Signed)
Procedure Name: Intubation Date/Time: 08/02/2020 11:47 AM Performed by: Nils Pyle, CRNA Pre-anesthesia Checklist: Patient identified, Emergency Drugs available, Suction available and Patient being monitored Patient Re-evaluated:Patient Re-evaluated prior to induction Oxygen Delivery Method: Circle System Utilized Preoxygenation: Pre-oxygenation with 100% oxygen Induction Type: IV induction Ventilation: Mask ventilation without difficulty Laryngoscope Size: Miller and 2 Grade View: Grade I Tube type: Oral Tube size: 7.0 mm Number of attempts: 1 Airway Equipment and Method: Stylet and Oral airway Placement Confirmation: ETT inserted through vocal cords under direct vision,  positive ETCO2 and breath sounds checked- equal and bilateral Secured at: 22 cm Tube secured with: Tape Dental Injury: Teeth and Oropharynx as per pre-operative assessment

## 2020-08-02 NOTE — Evaluation (Signed)
Physical Therapy Evaluation Patient Details Name: Brenda Morgan MRN: [id number] DOB: 1955-12-26 Today's Date: 08/02/2020   History of Present Illness  Pt is a 64 y.o. F with significant PMH of COPD, HTN, DM2, recent L4-5 decompression and fusion 7/6, readmission for postoperative back pain and acute kidney injury 7/14-7/20, who presents with worsening pain and drainage from wound site. Admitted with sepsis secondary to postoperative wound infection. S/p I&D of lumbar wound and placement of wound vac 08/02/2020.  Clinical Impression  Pt emotionally labile upon entry; pt reporting she was upset because her husband was not present. Despite this, pt willing to participate in therapy evaluation. Pt reports radicular numbness to right knee; intact to light touch upon assessment. Pt ambulating 200 feet with an upright walker at a min guard assist level. Will benefit from further acute PT to progress mobility as tolerated.     Follow Up Recommendations Home health PT;Supervision for mobility/OOB    Equipment Recommendations  None recommended by PT    Recommendations for Other Services       Precautions / Restrictions Precautions Precautions: Back;Other (comment) Precaution Booklet Issued: No Precaution Comments: Wound vac Restrictions Weight Bearing Restrictions: No      Mobility  Bed Mobility Overal bed mobility: Needs Assistance Bed Mobility: Rolling;Sidelying to Sit Rolling: Supervision Sidelying to sit: Min assist       General bed mobility comments: MinA for trunk elevation to upright  Transfers Overall transfer level: Needs assistance Equipment used: 4-wheeled walker Transfers: Sit to/from Stand Sit to Stand: Min guard            Ambulation/Gait Ambulation/Gait assistance: Min guard Gait Distance (Feet): 200 Feet Assistive device: 4-wheeled walker Gait Pattern/deviations: Step-through pattern;Decreased stride length     General Gait Details: Decreased  bilateral foot clearance but steady speed and no overt LOB. min guard for safety  Stairs            Wheelchair Mobility    Modified Rankin (Stroke Patients Only)       Balance Overall balance assessment: Needs assistance Sitting-balance support: No upper extremity supported;Feet supported Sitting balance-Leahy Scale: Good     Standing balance support: Bilateral upper extremity supported Standing balance-Leahy Scale: Poor                               Pertinent Vitals/Pain Pain Assessment: Faces Faces Pain Scale: Hurts a little bit Pain Location: back Pain Descriptors / Indicators: Guarding Pain Intervention(s): Monitored during session    Home Living Family/patient expects to be discharged to:: Private residence Living Arrangements: Spouse/significant other Available Help at Discharge: Family;Available PRN/intermittently Type of Home: Mobile home Home Access: Ramped entrance     Home Layout: One level Home Equipment: Walker - 2 wheels;Shower seat;Grab bars - toilet;Other (comment);Adaptive equipment      Prior Function Level of Independence: Needs assistance   Gait / Transfers Assistance Needed: Uses upright walker for ambulation  ADL's / Homemaking Assistance Needed: Has difficulty performing some ADL's        Hand Dominance   Dominant Hand: Right    Extremity/Trunk Assessment   Upper Extremity Assessment Upper Extremity Assessment: Defer to OT evaluation    Lower Extremity Assessment Lower Extremity Assessment: RLE deficits/detail;LLE deficits/detail RLE Deficits / Details: Strength 5/5 RLE Sensation: WNL LLE Deficits / Details: Strength 5/5 LLE Sensation: WNL       Communication   Communication: No difficulties  Cognition Arousal/Alertness: Awake/alert Behavior During  Therapy: WFL for tasks assessed/performed Overall Cognitive Status: No family/caregiver present to determine baseline cognitive functioning                                  General Comments: Pt emotionally labile upon entry, upset due to her husband not being present during "resting hours" on 4NP. Pt with decreased interaction during conversations with therapist, but overall pleasant, agreeable to mobility and following all commands      General Comments      Exercises     Assessment/Plan    PT Assessment Patient needs continued PT services  PT Problem List Decreased strength;Decreased activity tolerance;Decreased balance;Decreased mobility;Decreased cognition;Decreased safety awareness;Decreased knowledge of use of DME;Decreased knowledge of precautions;Pain       PT Treatment Interventions DME instruction;Gait training;Functional mobility training;Therapeutic activities;Therapeutic exercise;Balance training;Neuromuscular re-education;Cognitive remediation;Patient/family education    PT Goals (Current goals can be found in the Care Plan section)  Acute Rehab PT Goals Patient Stated Goal: to see her husband PT Goal Formulation: With patient Time For Goal Achievement: 08/16/20 Potential to Achieve Goals: Good    Frequency Min 3X/week   Barriers to discharge        Co-evaluation               AM-PAC PT "6 Clicks" Mobility  Outcome Measure Help needed turning from your back to your side while in a flat bed without using bedrails?: None Help needed moving from lying on your back to sitting on the side of a flat bed without using bedrails?: A Little Help needed moving to and from a bed to a chair (including a wheelchair)?: A Little Help needed standing up from a chair using your arms (e.g., wheelchair or bedside chair)?: A Little Help needed to walk in hospital room?: A Little Help needed climbing 3-5 steps with a railing? : A Little 6 Click Score: 19    End of Session   Activity Tolerance: Patient tolerated treatment well Patient left: in chair;with call bell/phone within reach Nurse Communication: Mobility  status PT Visit Diagnosis: Other abnormalities of gait and mobility (R26.89);Pain Pain - part of body:  (back)    Time: 1527-1550 PT Time Calculation (min) (ACUTE ONLY): 23 min   Charges:   PT Evaluation $PT Eval Moderate Complexity: 1 Mod PT Treatments $Therapeutic Activity: 8-22 mins          Lillia Pauls, PT, DPT Acute Rehabilitation Services Pager [phone] Office [phone]   Norval Morton 08/02/2020, 5:23 PM

## 2020-08-02 NOTE — Transfer of Care (Signed)
Immediate Anesthesia Transfer of Care Note  Patient: Brenda Morgan  Procedure(s) Performed: IRRIGATE AND DEBRIDEMENT OF LUMBAR WOUND, PLACEMENT OF WOUND VAC (N/A Spine Lumbar)  Patient Location: PACU  Anesthesia Type:General  Level of Consciousness: awake, alert , oriented and patient cooperative  Airway & Oxygen Therapy: Patient Spontanous Breathing, Patient connected to nasal cannula oxygen and Patient connected to face mask  Post-op Assessment: Report given to RN, Post -op Vital signs reviewed and stable and Patient moving all extremities  Post vital signs: Reviewed and stable  Last Vitals:  Vitals Value Taken Time  BP    Temp    Pulse    Resp    SpO2      Last Pain:  Vitals:   08/02/20 0730  TempSrc: Oral  PainSc:       Patients Stated Pain Goal: 2 (08/02/20 0535)  Complications: No complications documented.

## 2020-08-02 NOTE — Progress Notes (Signed)
   Providing Compassionate, Quality Care - Together  NEUROSURGERY PROGRESS NOTE   S: seen in pacu   O: EXAM:  BP 135/80 (BP Location: Right Arm)   Pulse 69   Temp 98.6 F (37 C)   Resp 10   SpO2 100%   Awake, alert, oriented  Speech fluent, appropriate  CN grossly intact  5/5 BUE/BLE  WV holding suction well  ASSESSMENT:  64 y.o. female with  1. Lumbar wound infection 2. Sepsis  -s/p wound washout, placement of wound vac 08/02/2020  PLAN: - wound care, wound vac care - pt/ot -dvt ppx -pain control -neuro checks -cultures sent in OR -planned closure pending wound status Friday or monday    Thank you for allowing me to participate in this patient's care.  Please do not hesitate to call with questions or concerns.   Monia Pouch, DO Neurosurgeon [facility] Cell: [phone]

## 2020-08-02 NOTE — Progress Notes (Signed)
Patient updated on delay 

## 2020-08-03 ENCOUNTER — Encounter (HOSPITAL_COMMUNITY): Payer: Self-pay | Admitting: Neurological Surgery

## 2020-08-03 LAB — BASIC METABOLIC PANEL
Anion gap: 9 (ref 5–15)
BUN: 10 mg/dL (ref 8–23)
CO2: 28 mmol/L (ref 22–32)
Calcium: 9.2 mg/dL (ref 8.9–10.3)
Chloride: 98 mmol/L (ref 98–111)
Creatinine, Ser: 0.68 mg/dL (ref 0.44–1.00)
GFR calc Af Amer: >60 mL/min (ref >60)
GFR calc non Af Amer: >60 mL/min (ref >60)
Glucose, Bld: 168 mg/dL — ABNORMAL HIGH (ref 70–99)
Potassium: 3.5 mmol/L (ref 3.5–5.1)
Sodium: 135 mmol/L (ref 135–145)

## 2020-08-03 LAB — C-REACTIVE PROTEIN: CRP: 1.8 mg/dL — ABNORMAL HIGH (ref <1.0)

## 2020-08-03 LAB — VANCOMYCIN, TROUGH: Vancomycin Tr: 17 ug/mL (ref 15–20)

## 2020-08-03 LAB — GLUCOSE, CAPILLARY
Glucose-Capillary: 142 mg/dL — ABNORMAL HIGH (ref 70–99)
Glucose-Capillary: 103 mg/dL — ABNORMAL HIGH (ref 70–99)
Glucose-Capillary: 156 mg/dL — ABNORMAL HIGH (ref 70–99)
Glucose-Capillary: 174 mg/dL — ABNORMAL HIGH (ref 70–99)

## 2020-08-03 LAB — SEDIMENTATION RATE: Sed Rate: 62 mm/hr — ABNORMAL HIGH (ref 0–22)

## 2020-08-03 LAB — MAGNESIUM: Magnesium: 1.8 mg/dL (ref 1.7–2.4)

## 2020-08-03 LAB — PHOSPHORUS: Phosphorus: 3.1 mg/dL (ref 2.5–4.6)

## 2020-08-03 MED ORDER — RIFABUTIN 150 MG PO CAPS
300.0000 mg | ORAL_CAPSULE | Freq: Every day | ORAL | Status: DC
  Administered 2020-08-03 – 2020-08-05 (×3): 300 mg via ORAL
  Filled 2020-08-03 (×3): qty 2

## 2020-08-03 MED ORDER — TRAZODONE HCL 150 MG PO TABS
75.0000 mg | ORAL_TABLET | Freq: Every day | ORAL | Status: DC
  Administered 2020-08-03 – 2020-08-04 (×2): 75 mg via ORAL
  Filled 2020-08-03 (×2): qty 1

## 2020-08-03 MED ORDER — SODIUM CHLORIDE 0.9% FLUSH: 10.0000 mL | INTRAVENOUS | Status: DC | PRN

## 2020-08-03 MED ORDER — SODIUM CHLORIDE 0.9% FLUSH
10.0000 mL | Freq: Two times a day (BID) | INTRAVENOUS | Status: DC
  Administered 2020-08-03 – 2020-08-05 (×5): 10 mL

## 2020-08-03 MED ORDER — MAGNESIUM SULFATE 2 GM/50ML IV SOLN: 2.0000 g | Freq: Once | INTRAVENOUS | Status: DC

## 2020-08-03 MED ORDER — PRAMIPEXOLE DIHYDROCHLORIDE 1 MG PO TABS
1.0000 mg | ORAL_TABLET | Freq: Every day | ORAL | Status: DC
  Administered 2020-08-03 – 2020-08-04 (×2): 1 mg via ORAL
  Filled 2020-08-03 (×3): qty 1

## 2020-08-03 NOTE — Op Note (Signed)
   Providing Compassionate, Quality Care - Together  DATE OF SERVICE: 08/02/2020  PREOP DIAGNOSIS:  1. Lumbar wound infection status post L4-5 lumbar fusion 2. Sepsis  POSTOP DIAGNOSIS: Same  PROCEDURE: 1. Incision and drainage of lumbar wound, with sharp debridement 2. Placement of wound VAC  SURGEON: Dr. Kendell Bane C. Sonora Catlin, DO  ASSISTANT: None  ANESTHESIA: General Endotracheal  EBL: 50 cc  SPECIMENS: Wound cultures from the lumbar wound  DRAINS: Wound VAC therapy  COMPLICATIONS: None  CONDITION: Hemodynamically stable  HISTORY: Fidela Cieslak is a 64 y.o. female who underwent an L4-5 fusion by Dr. Conchita Paris 06/28/2020 that came to the emergency department with altered mental status and purulent wound drainage from her lumbar wound.  She previously was on oral antibiotics in which she failed.  Due to the purulent drainage, her wound needed to be debrided and explored for a deep infection.  I discussed all the risks and benefits of the surgery including heart attack, stroke, death, need for more surgery, temporary or permanent paralysis, bleeding.  I also discussed the likelihood of me placing a wound VAC and needing to be primarily closed at a later date pending the progression of the wound.  PROCEDURE IN DETAIL: The patient was brought to the [facility]. After induction of general anesthesia, the patient was positioned on the operative table in the prone position. All pressure points were meticulously padded. Skin incision was then marked out and prepped and draped in the usual sterile fashion.  Using a 10 blade, the previous lumbar wound was incised.  There was note of a abnormal fluid collection in the soft tissue above the fascia.  The fascia did not appear violated but did appear fluctuant therefore using a 10 blade I opened the fascia.  There was a small abnormal appearing fluid collection.  I cultured the wound. I used the 10 blade to perform sharp  debridement in the walls of the wound until healthy bleeding tissue was encountered.  I explored the lumbar hardware which was in connection with the deeper fluid collection.  The hardware appeared intact.  The wound was then copiously irrigated with the pulse lavage with bacitracin irrigation.  Hemostasis was obtained with bipolar cautery.  The wound was then copiously irrigated again.  There was some ill-appearing dermal tissue along the incision that was carefully excised using a 15 blade.  The wound appeared hemostatic.  I irrigated copiously again and noted the wound was hemostatic.  An appropriate size sponge was then placed deep in the wound and a wound VAC dressing was applied.  The wound VAC was noted to hold appropriate suction.   At the end of the case all sponge, needle, and instrument counts were correct. The patient was then transferred to the stretcher, extubated, and taken to the post-anesthesia care unit in stable hemodynamic condition.

## 2020-08-03 NOTE — TOC Initial Note (Signed)
Transition of Care (TOC) - Initial/Assessment Note  Donn Pierini RN,BSN Transitions of Care Unit 4NP (non trauma) - RN Case Manager See Treatment Team for direct Phone #   Patient Details  Name: Brenda Morgan MRN: [id number] Date of Birth: Jun 17, 1956  Transition of Care [facility]) CM/SW Contact:    Darrold Span, RN Phone Number: 08/03/2020, 4:41 PM  Clinical Narrative:                 Pt readmitted with infected wound- s/p I&D with wound VAC placement, PICC line placed for LT abx needs- will need 6 wks per ID. Notified by Pam with [facility] that they have received referral for home abx needs per ID. Pt was active with [facility] for HHPT/OT- have placed call to [facility] to see if they can add needed RN services for abx needs when pt ready for discharge- per MD notes by need wound closure prior to transition home pending wound assessments. Have faxed orders for HHRN/PT/OT to [facility] and will f/u with them to see if they can provide needed services adding RN.  TOC to continue to follow for transition needs.   Expected Discharge Plan: Home w Home Health Services Barriers to Discharge: Continued Medical Work up   Patient Goals and CMS Choice        Expected Discharge Plan and Services Expected Discharge Plan: Home w Home Health Services   Discharge Planning Services: CM Consult Post Acute Care Choice: Home Health, Resumption of Svcs/PTA Provider Living arrangements for the past 2 months: Mobile Home                           HH Arranged: RN, PT, OT [facility] Agency: [facility] Date HH Agency Contacted: 08/03/20 Time HH Agency Contacted: 1200 Representative spoke with at [facility] Agency: [place]  Prior Living Arrangements/Services Living arrangements for the past 2 months: Mobile Home Lives with:: Spouse              Current home services: DME, Home OT, Home PT    Activities of Daily Living Home Assistive Devices/Equipment: Blood pressure cuff, CBG  Meter, Eyeglasses, Dentures (specify type), Shower chair without back, Walker (specify type), BIPAP, Grab bars around toilet, Wheelchair ADL Screening (condition at time of admission) Patient's cognitive ability adequate to safely complete daily activities?: Yes Is the patient deaf or have difficulty hearing?: No Does the patient have difficulty seeing, even when wearing glasses/contacts?: No Does the patient have difficulty concentrating, remembering, or making decisions?: No Patient able to express need for assistance with ADLs?: Yes Does the patient have difficulty dressing or bathing?: No Independently performs ADLs?: Yes (appropriate for developmental age) Does the patient have difficulty walking or climbing stairs?: No Weakness of Legs: Both Weakness of Arms/Hands: None  Permission Sought/Granted                  Emotional Assessment              Admission diagnosis:  Postoperative wound infection [T81.49XA] SOB (shortness of breath) [R06.02] Wound infection after surgery [T81.49XA] Patient Active Problem List   Diagnosis Date Noted  . Infection of lumbar spine (HCC) 08/02/2020  . Hardware complicating wound infection (HCC) 08/02/2020  . COPD (chronic obstructive pulmonary disease) (HCC) 08/01/2020  . DM II (diabetes mellitus, type II), controlled (HCC) 08/01/2020  . HTN (hypertension) 08/01/2020  . Postoperative wound infection 07/31/2020  . Postoperative back pain 07/06/2020  . Lumbar  radiculopathy 07/06/2020  . Lumbar spinal stenosis 06/28/2020   PCP:  Patient, No Pcp Per Pharmacy:   [id number] [place], [place] - [address] [facility] [address] [place] [address] Phone: [phone] Fax: [phone]  [facility] [place], [place] - [address] [place] [address] Phone: [phone] Fax: [phone]     Social Determinants of Health (SDOH)  Interventions    Readmission Risk Interventions No flowsheet data found.

## 2020-08-03 NOTE — Progress Notes (Signed)
Peripherally Inserted Central Catheter Placement  The IV Nurse has discussed with the patient and/or persons authorized to consent for the patient, the purpose of this procedure and the potential benefits and risks involved with this procedure.  The benefits include less needle sticks, lab draws from the catheter, and the patient may be discharged home with the catheter. Risks include, but not limited to, infection, bleeding, blood clot (thrombus formation), and puncture of an artery; nerve damage and irregular heartbeat and possibility to perform a PICC exchange if needed/ordered by physician.  Alternatives to this procedure were also discussed.  Bard Power PICC patient education guide, fact sheet on infection prevention and patient information card has been provided to patient /or left at bedside.    PICC Placement Documentation  PICC Single Lumen 08/03/20 PICC Right Basilic 37 cm 0 cm (Active)  Indication for Insertion or Continuance of Line Prolonged intravenous therapies 08/03/20 0940  Exposed Catheter (cm) 0 cm 08/03/20 0940  Site Assessment Clean;Dry;Intact 08/03/20 0940  Line Status Flushed;Blood return noted;Saline locked 08/03/20 0940  Dressing Type Transparent 08/03/20 0940  Dressing Status Clean;Dry;Intact;Antimicrobial disc in place 08/03/20 0940  Dressing Change Due 08/10/20 08/03/20 0940       Brenda Morgan 08/03/2020, 9:42 AM

## 2020-08-03 NOTE — Progress Notes (Signed)
[facility]  Date of Admission:  07/31/2020      Total days of antibiotics 4   Day 4 vancomycin            ASSESSMENT: Brenda Morgan is a 64 y.o. female with early post op wound infection following L4-5 posterior fusion 7/06. Deep involvement of turbid appearing fluid to hardware now POD 1 I&D. Intraoperative gram stain is negative and cultures pending.   Suspect staphylococcal infection. Will continue Vancomycin and add Rifabutin PO once daily for synergy (contraindicated Rifampin with Lamictal).   Sed Rate (mm/hr)  Date Value  07/31/2020 55 (H)   CRP (mg/dL)  Date Value  49/70/2637 12.8 (H)     PLAN: 1. Continue Vancomycin IV 2. PICC in place  3. Will notify home health.  4. Follow micro cultures - will follow discharge timing pending Neurosurgery recommendations.  5. Add Rifabutin 300 mg QD - may cause yellow/orange discoloration to tears, saliva, urine.     Principal Problem:   Postoperative wound infection Active Problems:   Infection of lumbar spine (HCC)   Hardware complicating wound infection (HCC)   Postoperative back pain   COPD (chronic obstructive pulmonary disease) (HCC)   DM II (diabetes mellitus, type II), controlled (HCC)   HTN (hypertension)    Chlorhexidine Gluconate Cloth  6 each Topical Q0600   feeding supplement (ENSURE ENLIVE)  237 mL Oral BID BM   heparin injection (subcutaneous)  5,000 Units Subcutaneous Q12H   insulin aspart  0-15 Units Subcutaneous TID WC   insulin aspart  0-5 Units Subcutaneous QHS   mupirocin ointment  1 application Nasal BID   rifabutin  300 mg Oral Daily   sodium chloride flush  10-40 mL Intracatheter Q12H   sodium chloride flush  3 mL Intravenous Once   sodium chloride flush  3 mL Intravenous Q12H   sodium chloride flush  3 mL Intravenous Q12H    SUBJECTIVE: Feeling OK. Back pain is tolerable. Not interested in breakfast - little nauseated.  PICC was  recently placed.    Review of Systems: Review of Systems  Constitutional: Negative for chills and fever.  HENT: Negative for tinnitus.   Eyes: Negative for blurred vision and photophobia.  Respiratory: Negative for cough and sputum production.   Cardiovascular: Negative for chest pain.  Gastrointestinal: Positive for nausea. Negative for diarrhea and vomiting.  Genitourinary: Negative for dysuria.  Musculoskeletal: Positive for back pain.  Skin: Negative for rash.  Neurological: Negative for headaches.    Allergies  Allergen Reactions   Amlodipine Anaphylaxis, Swelling and Other (See Comments)    Tongue swelling    Naltrexone Hives   Penicillins Hives and Other (See Comments)    Tolerated ceftriaxone and cefepime 06/2017 Hives      OBJECTIVE: Vitals:   08/02/20 2019 08/02/20 2347 08/03/20 0413 08/03/20 0754  BP: 98/77 (!) 136/58 (!) 122/59 (!) 106/58  Pulse: 75 81 73 70  Resp: 20 15 20 14   Temp: 97.7 F (36.5 C) 98.1 F (36.7 C) 98.2 F (36.8 C) 97.7 F (36.5 C)  TempSrc: Oral Oral Oral Oral  SpO2: 94% 98% 100% 96%   There is no height or weight on file to calculate BMI.  Physical Exam HENT:     Mouth/Throat:     Mouth: No oral lesions.     Dentition: No dental abscesses.  Cardiovascular:     Rate and Rhythm: Normal rate and regular rhythm.  Heart sounds: Normal heart sounds.  Pulmonary:     Effort: Pulmonary effort is normal.     Breath sounds: Normal breath sounds.  Abdominal:     General: There is no distension.     Palpations: Abdomen is soft.     Tenderness: There is no abdominal tenderness.  Musculoskeletal:        General: No tenderness. Normal range of motion.     Comments: Wound vac to L spine intact and engaged correctly with serosanguinous drainage.   Lymphadenopathy:     Cervical: No cervical adenopathy.  Skin:    General: Skin is warm and dry.     Findings: No rash.  Neurological:     Mental Status: She is alert and oriented to  person, place, and time.  Psychiatric:        Judgment: Judgment normal.     Lab Results Lab Results  Component Value Date   WBC 8.5 08/02/2020   HGB 12.0 08/02/2020   HCT 38.0 08/02/2020   MCV 91.6 08/02/2020   PLT 232 08/02/2020    Lab Results  Component Value Date   CREATININE 0.76 08/02/2020   BUN 12 08/02/2020   NA 137 08/02/2020   K 3.7 08/02/2020   CL 98 08/02/2020   CO2 29 08/02/2020    Lab Results  Component Value Date   ALT 12 08/02/2020   AST 9 (L) 08/02/2020   ALKPHOS 98 08/02/2020   BILITOT 0.8 08/02/2020     Microbiology: Recent Results (from the past 240 hour(s))  Blood culture (routine x 2)     Status: None (Preliminary result)   Collection Time: 07/31/20 11:57 AM   Specimen: BLOOD RIGHT HAND  Result Value Ref Range Status   Specimen Description BLOOD RIGHT HAND  Final   Special Requests   Final    BOTTLES DRAWN AEROBIC AND ANAEROBIC Blood Culture adequate volume   Culture   Final    NO GROWTH 2 DAYS Performed at [facility], [address], [place], [place] [address]    Report Status PENDING  Incomplete  Blood culture (routine x 2)     Status: None (Preliminary result)   Collection Time: 07/31/20  2:18 PM   Specimen: BLOOD  Result Value Ref Range Status   Specimen Description BLOOD RIGHT ANTECUBITAL  Final   Special Requests   Final    BOTTLES DRAWN AEROBIC AND ANAEROBIC Blood Culture results may not be optimal due to an inadequate volume of blood received in culture bottles   Culture   Final    NO GROWTH 2 DAYS Performed at [facility], [address] [place], [place] [address]    Report Status PENDING  Incomplete  SARS Coronavirus 2 by RT PCR (hospital order, performed in [facility] hospital lab) Nasopharyngeal Nasopharyngeal Swab     Status: None   Collection Time: 07/31/20  4:09 PM   Specimen: Nasopharyngeal Swab  Result Value Ref Range Status   SARS Coronavirus 2 NEGATIVE NEGATIVE Final    Comment: (NOTE) SARS-CoV-2  target nucleic acids are NOT DETECTED.  The SARS-CoV-2 RNA is generally detectable in upper and lower respiratory specimens during the acute phase of infection. The lowest concentration of SARS-CoV-2 viral copies this assay can detect is 250 copies / mL. A negative result does not preclude SARS-CoV-2 infection and should not be used as the sole basis for treatment or other patient management decisions.  A negative result may occur with improper specimen  collection / handling, submission of specimen other than nasopharyngeal swab, presence of viral mutation(s) within the areas targeted by this assay, and inadequate number of viral copies (<250 copies / mL). A negative result must be combined with clinical observations, patient history, and epidemiological information.  Fact Sheet for Patients:   BoilerBrush.com.cy  Fact Sheet for Healthcare Providers: https://pope.com/  This test is not yet approved or  cleared by the [place] FDA and has been authorized for detection and/or diagnosis of SARS-CoV-2 by FDA under an Emergency Use Authorization (EUA).  This EUA will remain in effect (meaning this test can be used) for the duration of the COVID-19 declaration under Section 564(b)(1) of the Act, 21 U.S.C. section 360bbb-3(b)(1), unless the authorization is terminated or revoked sooner.  Performed at [facility], [address] [place], [place] [address]   MRSA PCR Screening     Status: Abnormal   Collection Time: 08/01/20  7:16 AM   Specimen: Nasal Mucosa; Nasopharyngeal  Result Value Ref Range Status   MRSA by PCR POSITIVE (A) NEGATIVE Final    Comment:        The GeneXpert MRSA Assay (FDA approved for NASAL specimens only), is one component of a comprehensive MRSA colonization surveillance program. It is not intended to diagnose MRSA infection nor to guide or monitor treatment for MRSA infections. RESULT CALLED TO,  READ BACK BY AND VERIFIED WITH: A. Lavway RN 10:50 08/01/20 ([email or web address]) Performed at [facility], [address] [place], [place] [address]   Aerobic/Anaerobic Culture (surgical/deep wound)     Status: None (Preliminary result)   Collection Time: 08/02/20 12:01 PM   Specimen: Soft Tissue, Other  Result Value Ref Range Status   Specimen Description WOUND LUMBAR  Final   Special Requests NONE  Final   Gram Stain   Final    NO WBC SEEN NO ORGANISMS SEEN Performed at [facility], [address] [place], [place] [address]    Culture PENDING  Incomplete   Report Status PENDING  Incomplete     Rexene Alberts, MSN, NP-C [facility]  [email or web address] Pager: [phone] Office: [phone] [facility] Main Line: [phone]

## 2020-08-03 NOTE — Progress Notes (Signed)
   Providing Compassionate, Quality Care - Together  NEUROSURGERY PROGRESS NOTE   S: No issues overnight. Some mild back pain, feels better overall  O: EXAM:  BP (!) 103/49 (BP Location: Left Arm)   Pulse 66   Temp 97.7 F (36.5 C) (Oral)   Resp 15   SpO2 99%   Awake, alert, oriented  Speech fluent, appropriate  CN grossly intact  5/5 BUE/BLE  WV holding suction well  ASSESSMENT:  64 y.o. female with  1. Lumbar wound infection 2. Sepsis  -s/p wound washout, placement of wound vac 08/02/2020  PLAN: - wound care, wound vac care - pt/ot -dvt ppx -pain control -neuro checks -cultures sent in OR -planned closure pending wound status Monday w Dr. Maurice Small -abx per ID    Thank you for allowing me to participate in this patient's care.  Please do not hesitate to call with questions or concerns.   Monia Pouch, DO Neurosurgeon [facility] Cell: [phone]

## 2020-08-03 NOTE — Evaluation (Signed)
Occupational Therapy Evaluation Patient Details Name: Brenda Morgan MRN: [id number] DOB: 1956-09-25 Today's Date: 08/03/2020    History of Present Illness Pt is a 64 y.o. F with significant PMH of COPD, HTN, DM2, recent L4-5 decompression and fusion 7/6, readmission for postoperative back pain and acute kidney injury 7/14-7/20, who presents with worsening pain and drainage from wound site. Admitted with sepsis secondary to postoperative wound infection. S/p I&D of lumbar wound and placement of wound vac 08/02/2020.   Clinical Impression   This 64 yo female admitted with above presents to acute OT with PLOF since initial back surgery of needing A for basic ADLs due to pain, back precautions, and did not have all AE at that time. She currently needs increased A or basic ADLs (LB) and toileting hygiene. Pt will benefit from acute OT without need for follow up.    Follow Up Recommendations  No OT follow up;Supervision - Intermittent    Equipment Recommendations  None recommended by OT       Precautions / Restrictions Precautions Precautions: Back;Other (comment) Precaution Comments: Wound vac Required Braces or Orthoses: Spinal Brace Restrictions Weight Bearing Restrictions: No      Mobility Bed Mobility Overal bed mobility: Needs Assistance Bed Mobility: Rolling;Sit to Sidelying Rolling: Supervision (HOB up, use of rail) Sidelying to sit: Supervision       General bed mobility comments: talked with pt and husband that a bedrail may be a good idea  Transfers Overall transfer level: Needs assistance Equipment used:  (upright rollator) Transfers: Sit to/from Stand Sit to Stand: Supervision              Balance Overall balance assessment: Mild deficits observed, not formally tested                                         ADL either performed or assessed with clinical judgement   ADL Overall ADL's : Needs assistance/impaired Eating/Feeding:  Independent;Sitting   Grooming: Set up;Sitting   Upper Body Bathing: Set up;Sitting   Lower Body Bathing: Moderate assistance Lower Body Bathing Details (indicate cue type and reason): S sit<>stand Upper Body Dressing : Set up;Sitting   Lower Body Dressing: Maximal assistance Lower Body Dressing Details (indicate cue type and reason): S sit<>stand Toilet Transfer: Supervision/safety;Ambulation Toilet Transfer Details (indicate cue type and reason): upright RW (simulated bed>walk around unit>recliner) Toileting- Clothing Manipulation and Hygiene: Moderate assistance Toileting - Clothing Manipulation Details (indicate cue type and reason): S sit<>stand             Vision Patient Visual Report: No change from baseline              Pertinent Vitals/Pain Pain Assessment: 0-10 Pain Score: 2  Pain Location: back Pain Descriptors / Indicators: Sore Pain Intervention(s): Limited activity within patient's tolerance     Hand Dominance Right   Extremity/Trunk Assessment Upper Extremity Assessment Upper Extremity Assessment: Overall WFL for tasks assessed           Communication Communication Communication: No difficulties   Cognition Arousal/Alertness: Awake/alert Behavior During Therapy: WFL for tasks assessed/performed Overall Cognitive Status: Within Functional Limits for tasks assessed  Home Living Family/patient expects to be discharged to:: Private residence Living Arrangements: Spouse/significant other Available Help at Discharge: Family;Available PRN/intermittently Type of Home: Mobile home Home Access: Ramped entrance     Home Layout: One level     Bathroom Shower/Tub: Arts development officer Toilet: Handicapped height     Home Equipment: Environmental consultant - 2 wheels;Shower seat;Grab bars - toilet;Other (comment);Adaptive equipment (bidet, upright RW, adjustable bed) Adaptive Equipment:  Reacher;Sock aid;Long-handled shoe horn;Long-handled sponge        Prior Functioning/Environment Level of Independence: Needs assistance  Gait / Transfers Assistance Needed: Uses upright walker for ambulation ADL's / Homemaking Assistance Needed: Has difficulty performing some ADL's--husband has been A'ing her before he goes to work            OT Problem List: Decreased range of motion;Decreased strength;Impaired balance (sitting and/or standing);Pain      OT Treatment/Interventions: Self-care/ADL training;DME and/or AE instruction;Patient/family education    OT Goals(Current goals can be found in the care plan section) Acute Rehab OT Goals Patient Stated Goal: to be able to go home soon OT Goal Formulation: With patient Time For Goal Achievement: 08/17/20 Potential to Achieve Goals: Good  OT Frequency: Min 2X/week              AM-PAC OT "6 Clicks" Daily Activity     Outcome Measure Help from another person eating meals?: None Help from another person taking care of personal grooming?: A Little Help from another person toileting, which includes using toliet, bedpan, or urinal?: A Lot Help from another person bathing (including washing, rinsing, drying)?: A Lot Help from another person to put on and taking off regular upper body clothing?: A Little Help from another person to put on and taking off regular lower body clothing?: A Lot 6 Click Score: 16   End of Session Equipment Utilized During Treatment: Back brace (upright rollator)  Activity Tolerance: Patient tolerated treatment well Patient left: in chair;with call bell/phone within reach;with family/visitor present  OT Visit Diagnosis: Unsteadiness on feet (R26.81);Pain;Muscle weakness (generalized) (M62.81) Pain - part of body:  (incisional site)                Time: 7078-6754 OT Time Calculation (min): 28 min Charges:  OT General Charges $OT Visit: 1 Visit OT Evaluation $OT Eval Moderate Complexity: 1 Mod OT  Treatments $Self Care/Home Management : 8-22 mins  Ignacia Palma, OTR/L Acute [employer] Pager [phone] Office [phone]    Brenda Morgan 08/03/2020, 12:00 PM

## 2020-08-03 NOTE — Progress Notes (Signed)
PROGRESS NOTE    Brenda Morgan  QQV:[id number] DOB: 03-02-1956 DOA: 07/31/2020   PCP: Patient, No Pcp Per   Brief Narrative:  Brenda Morgan is a 64 y.o. female with medical history significant of COPD, essential hypertension, diabetes mellitus type 2 without complication, fatty liver, and sleep apnea presents with complaints of worsening back pain.  Patient had underwent L4-L5 decompression and fusion on 7/6 by Dr. Conchita Paris.[name]  She was readmitted for postoperative back pain with acute kidney injury from 7/14-7/20 treated with steroids and PT/OT.  Seen at urgent care on 7/31 for worsening pain after reporting drainage from the wound site.  She was given Rocephin and started on doxycycline at that time.  She was seen by Dr. Nundkumar 3 days ago and the wound appeared to be getting better.  He started her on a 10-day course of Bactrim, but wanted to get a MRI lumbar spine for further evaluation.  However, patient was noted to be more lethargic with worsening back pain over the last day. Purulent drainage and redness was noted around the surgical site.  She complains of decreased sensation on the left leg and was globally weak. Patient denies any fever or recent falls. Dr. Theola Sequin neurosurgery was formally consulted.    Patient is admitted for sepsis secondary to postoperative wound infection.  Patient is started on IV antibiotics.  Patient underwent washout and wound VAC placement on 8/10, tolerated well.  Patient may require wound closure pending wound status on Friday or Monday.  Infectious disease consulted,  Patient has received PICC line needs vancomycin for 6 weeks.  Assessment & Plan:   Principal Problem:   Postoperative wound infection Active Problems:   Postoperative back pain   COPD (chronic obstructive pulmonary disease) (HCC)   DM II (diabetes mellitus, type II), controlled (HCC)   HTN (hypertension)   Infection of lumbar spine (HCC)   Hardware complicating wound infection  (HCC)   Sepsis secondary to postoperative wound infection: Acute.   Patient was noted to be tachycardic and tachypneic with WBC elevated at 19 and lactic acid initially 2.6.   Lumbar spine noted to be likely source of infection with purulent drainage discharge reported.   Patient reporting decrease in sensation on the right lower extremity. -Admitted to telemetry -Sepsis protocol initiated. - Blood cultures : No growth so far -MRI of the lumbar spine : Marrow edema within L4-L5 vertebral body -Continue empiric antibiotics of vancomycin -  UA , CXR : unremarkable. -2 L of lactated Ringer's given -Neurosurgery consulted .  S/P Washout and placement of wound VAC on Tuesday. -Patient may require wound closure pending wound status on Friday or Monday. -Infectious disease consulted recommended vancomycin for 6-week. Patient has received PICC line.   Diabetes mellitus type 2:  Last hemoglobin A1c 6.7 on 06/24/2020.   Blood glucose appears to be relatively well controlled.   Home medications include NovoLog 70/30 mix 65 units 2 times daily, Smaglutide 1 mg every Sunday, and Empagliflozin-Metformin 25-1000 mg daily. -Hypoglycemic protocol -CBGs before every meal with sensitive SSI  -Reduced 70/30 mix to 30 units twice daily  Essential hypertension:  Blood pressures currently relatively well controlled. -Continue Coreg -Will need to restart rest of patient's home regimen as tolerated when medically appropriate.  Chronic kidney disease stage IIIa:  Creatinine mildly elevated at 1.17 with BUN 24 to suggest patient dehydrated.   Baseline creatinine appears to be around 1. -Recheck kidney function in a.m.  Hyperlipidemia -Continue atorvastatin.    DVT prophylaxis: Lovenox  Code Status: Full Family Communication: Husband updated at bedside. Disposition Plan:  Home with home PT, nursing and aide. Consults called: Neurosurgery Admission status: Inpatient. Patient is not medically  clear, requiring wound care and IV antibiotic.   Consultants:    Neurosurgery  Procedures: Antimicrobials:  Anti-infectives (From admission, onward)   Start     Dose/Rate Route Frequency Ordered Stop   08/03/20 1400  rifabutin (MYCOBUTIN) capsule 300 mg     Discontinue     300 mg Oral Daily 08/03/20 0946     08/02/20 1216  bacitracin 50,000 Units in sodium chloride 0.9 % 500 mL irrigation  Status:  Discontinued          As needed 08/02/20 1216 08/02/20 1251   08/02/20 0830  sodium chloride 0.9 % 1,000 mL with bacitracin 100,000 Units infusion        10 mL/hr  Irrigation To Surgery 08/02/20 0829 08/03/20 0830   08/02/20 0700  vancomycin (VANCOCIN) IVPB 1000 mg/200 mL premix  Status:  Discontinued        1,000 mg 200 mL/hr over 60 Minutes Intravenous On call to O.R. 08/02/20 0602 08/02/20 1326   08/02/20 0000  rifabutin (MYCOBUTIN) 150 MG capsule     Discontinue     300 mg Oral Daily 08/02/20 1604     08/01/20 1600  vancomycin (VANCOCIN) IVPB 1000 mg/200 mL premix     Discontinue     1,000 mg 200 mL/hr over 60 Minutes Intravenous Every 12 hours 08/01/20 1417     08/01/20 0400  vancomycin (VANCOREADY) IVPB 750 mg/150 mL  Status:  Discontinued        750 mg 150 mL/hr over 60 Minutes Intravenous Every 12 hours 07/31/20 1559 08/01/20 1417   07/31/20 1600  vancomycin (VANCOREADY) IVPB 500 mg/100 mL        500 mg 100 mL/hr over 60 Minutes Intravenous  Once 07/31/20 1559 07/31/20 1833   07/31/20 1400  vancomycin (VANCOCIN) IVPB 1000 mg/200 mL premix        1,000 mg 200 mL/hr over 60 Minutes Intravenous  Once 07/31/20 1357 07/31/20 1543      Subjective: Patient was seen and examined at bedside.  No overnight events.  Patient reports pain is not controlled.   Patient is a s/p wash and wound VAC placement.  Patient has a PICC line in right arm.  Objective: Vitals:   08/02/20 2347 08/03/20 0413 08/03/20 0754 08/03/20 1137  BP: (!) 136/58 (!) 122/59 (!) 106/58 (!) 103/49  Pulse: 81 73  70 66  Resp: 15 20 14 15   Temp: 98.1 F (36.7 C) 98.2 F (36.8 C) 97.7 F (36.5 C) 97.7 F (36.5 C)  TempSrc: Oral Oral Oral Oral  SpO2: 98% 100% 96% 99%    Intake/Output Summary (Last 24 hours) at 08/03/2020 1454 Last data filed at 08/03/2020 0700 Gross per 24 hour  Intake 1326.85 ml  Output --  Net 1326.85 ml   There were no vitals filed for this visit.  Examination:  General exam: Appears calm and comfortable. Respiratory system: Clear to auscultation. Respiratory effort normal. Cardiovascular system: S1 & S2 heard, RRR. No JVD, murmurs, rubs, gallops or clicks. No pedal edema. Gastrointestinal system: Abdomen is nondistended, soft and nontender. No organomegaly or masses felt. Normal bowel sounds heard. Central nervous system: Alert and oriented. No focal neurological deficits. Extremities: No leg swelling, no cyanosis, no edema. Skin: No rashes, lesions or ulcers Psychiatry: Judgement and insight appear normal. Mood & affect appropriate.  Data Reviewed: I have personally reviewed following labs and imaging studies  CBC: Recent Labs  Lab 07/31/20 1153 08/01/20 0542 08/02/20 0413  WBC 19.0* 11.2* 8.5  NEUTROABS 14.4*  --   --   HGB 13.3 11.3* 12.0  HCT 43.0 35.5* 38.0  MCV 94.1 92.4 91.6  PLT 256 187 232   Basic Metabolic Panel: Recent Labs  Lab 07/31/20 1153 08/01/20 0542 08/02/20 0413  NA 140 137 137  K 3.7 3.4* 3.7  CL 95* 96* 98  CO2 29 28 29   GLUCOSE 163* 108* 130*  BUN 24* 14 12  CREATININE 1.17* 0.82 0.76  CALCIUM 9.8 8.9 9.2  MG  --   --  1.6*  PHOS  --   --  3.3   GFR: Estimated Creatinine Clearance: 82.8 mL/min (by C-G formula based on SCr of 0.76 mg/dL). Liver Function Tests: Recent Labs  Lab 07/31/20 1153 08/02/20 0413  AST 15 9*  ALT 13 12  ALKPHOS 131* 98  BILITOT 0.5 0.8  PROT 7.5 6.3*  ALBUMIN 3.3* 2.7*   No results for input(s): LIPASE, AMYLASE in the last 168 hours. No results for input(s): AMMONIA in the last 168  hours. Coagulation Profile: Recent Labs  Lab 08/02/20 1022  INR 1.1   Cardiac Enzymes: No results for input(s): CKTOTAL, CKMB, CKMBINDEX, TROPONINI in the last 168 hours. BNP (last 3 results) No results for input(s): PROBNP in the last 8760 hours. HbA1C: No results for input(s): HGBA1C in the last 72 hours. CBG: Recent Labs  Lab 08/02/20 1255 08/02/20 1555 08/02/20 2132 08/03/20 0752 08/03/20 1135  GLUCAP 139* 219* 215* 142* 103*   Lipid Profile: No results for input(s): CHOL, HDL, LDLCALC, TRIG, CHOLHDL, LDLDIRECT in the last 72 hours. Thyroid Function Tests: No results for input(s): TSH, T4TOTAL, FREET4, T3FREE, THYROIDAB in the last 72 hours. Anemia Panel: No results for input(s): VITAMINB12, FOLATE, FERRITIN, TIBC, IRON, RETICCTPCT in the last 72 hours. Sepsis Labs: Recent Labs  Lab 07/31/20 1153 07/31/20 1558 07/31/20 2113  LATICACIDVEN 2.6* 1.5 1.4    Recent Results (from the past 240 hour(s))  Blood culture (routine x 2)     Status: None (Preliminary result)   Collection Time: 07/31/20 11:57 AM   Specimen: BLOOD RIGHT HAND  Result Value Ref Range Status   Specimen Description BLOOD RIGHT HAND  Final   Special Requests   Final    BOTTLES DRAWN AEROBIC AND ANAEROBIC Blood Culture adequate volume   Culture   Final    NO GROWTH 3 DAYS Performed at [facility], [address] [place], [place]    Report Status PENDING  Incomplete  Blood culture (routine x 2)     Status: None (Preliminary result)   Collection Time: 07/31/20  2:18 PM   Specimen: BLOOD  Result Value Ref Range Status   Specimen Description BLOOD RIGHT ANTECUBITAL  Final   Special Requests   Final    BOTTLES DRAWN AEROBIC AND ANAEROBIC Blood Culture results may not be optimal due to an inadequate volume of blood received in culture bottles   Culture   Final    NO GROWTH 3 DAYS Performed at [facility], [address] [place], [place]    Report Status  PENDING  Incomplete  SARS Coronavirus 2 by RT PCR (hospital order, performed in [facility] hospital lab) Nasopharyngeal Nasopharyngeal Swab     Status: None   Collection Time: 07/31/20  4:09 PM   Specimen:  Nasopharyngeal Swab  Result Value Ref Range Status   SARS Coronavirus 2 NEGATIVE NEGATIVE Final    Comment: (NOTE) SARS-CoV-2 target nucleic acids are NOT DETECTED.  The SARS-CoV-2 RNA is generally detectable in upper and lower respiratory specimens during the acute phase of infection. The lowest concentration of SARS-CoV-2 viral copies this assay can detect is 250 copies / mL. A negative result does not preclude SARS-CoV-2 infection and should not be used as the sole basis for treatment or other patient management decisions.  A negative result may occur with improper specimen collection / handling, submission of specimen other than nasopharyngeal swab, presence of viral mutation(s) within the areas targeted by this assay, and inadequate number of viral copies (<250 copies / mL). A negative result must be combined with clinical observations, patient history, and epidemiological information.  Fact Sheet for Patients:   BoilerBrush.com.cyhttps://www.fda.gov/media/136312/download  Fact Sheet for Healthcare Providers: https://pope.com/https://www.fda.gov/media/136313/download  This test is not yet approved or  cleared by the [place] FDA and has been authorized for detection and/or diagnosis of SARS-CoV-2 by FDA under an Emergency Use Authorization (EUA).  This EUA will remain in effect (meaning this test can be used) for the duration of the COVID-19 declaration under Section 564(b)(1) of the Act, 21 U.S.C. section 360bbb-3(b)(1), unless the authorization is terminated or revoked sooner.  Performed at [facility], [address] [place], [place] [address]   MRSA PCR Screening     Status: Abnormal   Collection Time: 08/01/20  7:16 AM   Specimen: Nasal Mucosa; Nasopharyngeal  Result Value Ref Range  Status   MRSA by PCR POSITIVE (A) NEGATIVE Final    Comment:        The GeneXpert MRSA Assay (FDA approved for NASAL specimens only), is one component of a comprehensive MRSA colonization surveillance program. It is not intended to diagnose MRSA infection nor to guide or monitor treatment for MRSA infections. RESULT CALLED TO, READ BACK BY AND VERIFIED WITH: A. Lavway RN 10:50 08/01/20 ([email or web address]) Performed at [facility], [address] [place], [place] [address]   Aerobic/Anaerobic Culture (surgical/deep wound)     Status: None (Preliminary result)   Collection Time: 08/02/20 12:01 PM   Specimen: Soft Tissue, Other  Result Value Ref Range Status   Specimen Description WOUND LUMBAR  Final   Special Requests NONE  Final   Gram Stain NO WBC SEEN NO ORGANISMS SEEN   Final   Culture   Final    NO GROWTH < 24 HOURS Performed at [facility], [address] [place], [place] [address]    Report Status PENDING  Incomplete     Radiology Studies: US EKG SITE RITE  Result Date: 08/02/2020 If Site Rite image not attached, placement could not be confirmed due to current cardiac rhythm.   Scheduled Meds: . Chlorhexidine Gluconate Cloth  6 each Topical Q0600  . feeding supplement (ENSURE ENLIVE)  237 mL Oral BID BM  . heparin injection (subcutaneous)  5,000 Units Subcutaneous Q12H  . insulin aspart  0-15 Units Subcutaneous TID WC  . insulin aspart  0-5 Units Subcutaneous QHS  . mupirocin ointment  1 application Nasal BID  . rifabutin  300 mg Oral Daily  . sodium chloride flush  10-40 mL Intracatheter Q12H  . sodium chloride flush  3 mL Intravenous Once  . sodium chloride flush  3 mL Intravenous Q12H  . sodium chloride flush  3 mL Intravenous Q12H   Continuous Infusions: .  sodium chloride    . lactated ringers 10 mL/hr at 08/02/20 0844  . methocarbamol (ROBAXIN) IV    . vancomycin 1,000 mg (08/03/20 0429)     LOS: 3 days    Time spent: 25 mins.   Cipriano Bunker, MD [facility]   If 7PM-7AM, please contact night-coverage

## 2020-08-03 NOTE — Progress Notes (Signed)
Pharmacy Antibiotic Note  Brenda Morgan is a 64 y.o. female admitted on 07/31/2020 with wound infection.  Pharmacy has been consulted for vancomycin dosing.   Patient had recent back surgery (7/6) and now reports increasing back pain, lethargy, and purulent discharge from her wound. Noted plans for I&D on 8/10.   Renal function has been stable. Vanc trough came back within goal tonight at 17. We will continue with the current dose.   Plan: - Cont Vancomycin to 1g IV every 12 hours - Will continue to follow renal function, culture results, LOT, and antibiotic de-escalation plans   Temp (24hrs), Avg:97.9 F (36.6 C), Min:97.7 F (36.5 C), Max:98.2 F (36.8 C)  Recent Labs  Lab 07/31/20 1153 07/31/20 1558 07/31/20 2113 08/01/20 0542 08/02/20 0413 08/03/20 1600  WBC 19.0*  --   --  11.2* 8.5  --   CREATININE 1.17*  --   --  0.82 0.76 0.68  LATICACIDVEN 2.6* 1.5 1.4  --   --   --   VANCOTROUGH  --   --   --   --   --  17    Estimated Creatinine Clearance: 82.8 mL/min (by C-G formula based on SCr of 0.68 mg/dL).    Allergies  Allergen Reactions  . Amlodipine Anaphylaxis, Swelling and Other (See Comments)    Tongue swelling   . Naltrexone Hives  . Penicillins Hives and Other (See Comments)    Tolerated ceftriaxone and cefepime 06/2017 Hives      Antimicrobials this admission: Vancomycin 8/8 >>   Dose adjustments this admission: 8/11 VT 17 on vanc 1g IV q12  Microbiology results: 8/8 BCx x2: ngtd 8/9 MRSA PCR >> positive   Ulyses Southward, PharmD, [place], AAHIVP, CPP Infectious Disease Pharmacist 08/03/2020 4:57 PM

## 2020-08-04 DIAGNOSIS — R110 Nausea: Secondary | ICD-10-CM

## 2020-08-04 LAB — GLUCOSE, CAPILLARY
Glucose-Capillary: 167 mg/dL — ABNORMAL HIGH (ref 70–99)
Glucose-Capillary: 142 mg/dL — ABNORMAL HIGH (ref 70–99)
Glucose-Capillary: 171 mg/dL — ABNORMAL HIGH (ref 70–99)
Glucose-Capillary: 141 mg/dL — ABNORMAL HIGH (ref 70–99)

## 2020-08-04 LAB — BASIC METABOLIC PANEL
Anion gap: 9 (ref 5–15)
BUN: 9 mg/dL (ref 8–23)
CO2: 27 mmol/L (ref 22–32)
Calcium: 9.1 mg/dL (ref 8.9–10.3)
Chloride: 101 mmol/L (ref 98–111)
Creatinine, Ser: 0.66 mg/dL (ref 0.44–1.00)
GFR calc Af Amer: >60 mL/min (ref >60)
GFR calc non Af Amer: >60 mL/min (ref >60)
Glucose, Bld: 125 mg/dL — ABNORMAL HIGH (ref 70–99)
Potassium: 3.5 mmol/L (ref 3.5–5.1)
Sodium: 137 mmol/L (ref 135–145)

## 2020-08-04 LAB — MAGNESIUM: Magnesium: 1.5 mg/dL — ABNORMAL LOW (ref 1.7–2.4)

## 2020-08-04 LAB — PHOSPHORUS: Phosphorus: 3.6 mg/dL (ref 2.5–4.6)

## 2020-08-04 MED ORDER — PROMETHAZINE HCL 25 MG/ML IJ SOLN
12.5000 mg | Freq: Once | INTRAMUSCULAR | Status: AC
  Administered 2020-08-04: 12.5 mg via INTRAVENOUS
  Filled 2020-08-04: qty 1

## 2020-08-04 MED ORDER — PANTOPRAZOLE SODIUM 40 MG IV SOLR
40.0000 mg | Freq: Once | INTRAVENOUS | Status: AC
  Administered 2020-08-04: 40 mg via INTRAVENOUS
  Filled 2020-08-04: qty 40

## 2020-08-04 MED ORDER — SODIUM CHLORIDE 0.9 % IV SOLN
2.0000 g | INTRAVENOUS | Status: DC
  Administered 2020-08-04 – 2020-08-05 (×2): 2 g via INTRAVENOUS
  Filled 2020-08-04 (×2): qty 20

## 2020-08-04 MED ORDER — MAGNESIUM SULFATE 2 GM/50ML IV SOLN
2.0000 g | Freq: Once | INTRAVENOUS | Status: AC
  Administered 2020-08-04: 2 g via INTRAVENOUS
  Filled 2020-08-04: qty 50

## 2020-08-04 NOTE — Consult Note (Signed)
WOC consult requested for Vac dressing change to back wound; pt went to the OR on 8/10.  Secure chat message sent to neurosurgery team requesting that WOC will change the dressing tomorrow and begin on a M/W/F schedule; this was agreed upon and we will change the dressing tomorrow as requested. Cammie Mcgee MSN, RN, CWOCN, [place], CNS [phone]

## 2020-08-04 NOTE — Progress Notes (Signed)
Patient seen and evaluated, wound vac in place, no complaints this morning, doing well. Will discuss with Dr. Conchita Paris regarding plan of care for wound going forward.

## 2020-08-04 NOTE — Progress Notes (Signed)
Physical Therapy Treatment Patient Details Name: Brenda Morgan MRN: [id number] DOB: Nov 17, 1956 Today's Date: 08/04/2020    History of Present Illness Pt is a 64 y.o. F with significant PMH of COPD, HTN, DM2, recent L4-5 decompression and fusion 7/6, readmission for postoperative back pain and acute kidney injury 7/14-7/20, who presents with worsening pain and drainage from wound site. Admitted with sepsis secondary to postoperative wound infection. S/p I&D of lumbar wound and placement of wound vac 08/02/2020.    PT Comments    Pt with improving tolerance to mobility, ambulating great hallway distance with use of upright walker and standing rest break x1. Pt with tachypnea ~40 breaths/min with exertion, recovers to baseline ~20 with rest. Pt with moderate to severe back pain during session, but manages pain s/s well. PT to conitnue to follow acutely.     Follow Up Recommendations  Home health PT;Supervision for mobility/OOB     Equipment Recommendations  None recommended by PT    Recommendations for Other Services       Precautions / Restrictions Precautions Precautions: Back;Other (comment) Precaution Booklet Issued: No Precaution Comments: Wound vac Restrictions Weight Bearing Restrictions: No    Mobility  Bed Mobility   Bed Mobility: Supine to Sit;Sit to Supine   Sidelying to sit: Supervision   Sit to supine: Min assist   General bed mobility comments: supervision for supine to sit for safety and increased time, min assist for return to supine for LE management.  Transfers Overall transfer level: Needs assistance Equipment used:  (upright rollator) Transfers: Sit to/from Stand Sit to Stand: Min guard         General transfer comment: for safety, increased time to rise and steady  Ambulation/Gait Ambulation/Gait assistance: Supervision;Min guard Gait Distance (Feet): 240 Feet Assistive device: 4-wheeled walker Gait Pattern/deviations: Step-through  pattern;Decreased stride length;Trunk flexed Gait velocity: decr   General Gait Details: min guard for safety, transitioning to supervision with pt-demonstrated steadiness. Verbal cuing for upright posture   Stairs             Wheelchair Mobility    Modified Rankin (Stroke Patients Only)       Balance Overall balance assessment: Mild deficits observed, not formally tested                                          Cognition Arousal/Alertness: Awake/alert Behavior During Therapy: WFL for tasks assessed/performed;Flat affect Overall Cognitive Status: Within Functional Limits for tasks assessed                                        Exercises      General Comments General comments (skin integrity, edema, etc.): pt's husband assisting with pericare and donning/doffing brief      Pertinent Vitals/Pain Pain Assessment: 0-10 Pain Score: 7  Faces Pain Scale: Hurts whole lot Pain Location: back Pain Descriptors / Indicators: Sore;Discomfort Pain Intervention(s): Monitored during session;Repositioned;Limited activity within patient's tolerance    Home Living                      Prior Function            PT Goals (current goals can now be found in the care plan section) Acute Rehab PT Goals Patient Stated Goal: to be able  to go home soon PT Goal Formulation: With patient Time For Goal Achievement: 08/16/20 Potential to Achieve Goals: Good Progress towards PT goals: Progressing toward goals    Frequency    Min 3X/week      PT Plan Current plan remains appropriate    Co-evaluation              AM-PAC PT "6 Clicks" Mobility   Outcome Measure  Help needed turning from your back to your side while in a flat bed without using bedrails?: A Little Help needed moving from lying on your back to sitting on the side of a flat bed without using bedrails?: A Little Help needed moving to and from a bed to a chair  (including a wheelchair)?: A Little Help needed standing up from a chair using your arms (e.g., wheelchair or bedside chair)?: A Little Help needed to walk in hospital room?: A Little Help needed climbing 3-5 steps with a railing? : A Lot 6 Click Score: 17    End of Session   Activity Tolerance: Patient tolerated treatment well Patient left: with call bell/phone within reach;in bed;with family/visitor present Nurse Communication: Mobility status PT Visit Diagnosis: Other abnormalities of gait and mobility (R26.89);Pain Pain - part of body:  (back)     Time: 2297-9892 PT Time Calculation (min) (ACUTE ONLY): 22 min  Charges:  $Gait Training: 8-22 mins                     Lovella Hardie E, PT Acute Rehabilitation Services Pager [phone]  Office [phone]    Tyrone Apple D Despina Hidden 08/04/2020, 5:29 PM

## 2020-08-04 NOTE — Progress Notes (Addendum)
[facility] for Infectious Disease  Date of Admission:  07/31/2020      Total days of antibiotics 5   Day 5 vancomycin            ASSESSMENT: Brenda Morgan is a 64 y.o. female with early post op wound infection following L4-5 posterior fusion 7/06. Deep involvement of turbid appearing fluid to hardware now POD 2 I&D. Intraoperative gram stain is negative and cultures are no growth for now. Will add Ceftriaxone to give gram negative coverage to be conservative if she is culture negative. Suspect staph species, however and will continue Rifabutin for synergy.   CRP trending down 12.8 >> 1.8  Nauseated today - unclear what is the cause. No rifabutin or pain medications since last pm.    PLAN: 1. Continue Vancomycin IV and Rifabutin 300 mg QD PO  2. Add Ceftriaxone IV with no culture growth  3. PICC in place   4. Follow micro cultures - will follow discharge timing pending Neurosurgery recommendations.  5. Outpatient antibiotics arranged with ID follow up.   OPAT ORDERS:  Diagnosis: Lspine infection complicated by [facility]  Culture Result: negative, pending   Allergies  Allergen Reactions  . Amlodipine Anaphylaxis, Swelling and Other (See Comments)    Tongue swelling   . Naltrexone Hives  . Penicillins Hives and Other (See Comments)    Tolerated ceftriaxone and cefepime 06/2017 Hives       Discharge antibiotics to be given via PICC line:  Per pharmacy protocol Vancomycin  Aim for Vancomycin trough 15-20 or AUC 400-550 (unless otherwise indicated)  + Ceftriaxone 2 gm IV Q24h   + PO Rifabutin 300 mg QD    Duration: 6 weeks   End Date: 09/13/20   [facility] Care Per Protocol with [employer] Use: Home health RN for IV administration and teaching, line care and labs.    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 Chlorhexidine Gluconate Cloth  6 each Topical Q0600  . feeding supplement (ENSURE ENLIVE)  237 mL Oral BID BM  . heparin injection (subcutaneous)  5,000 Units Subcutaneous Q12H  . insulin aspart  0-15 Units Subcutaneous TID WC  . insulin aspart  0-5 Units Subcutaneous QHS  . mupirocin ointment  1 application Nasal BID  . pramipexole  1 mg Oral QHS  . rifabutin  300 mg Oral Daily  . sodium chloride flush  10-40 mL Intracatheter Q12H  . sodium chloride flush  3 mL Intravenous Once  . sodium chloride flush  3 mL Intravenous Q12H  . sodium chloride flush  3 mL Intravenous Q12H  . traZODone  75 mg Oral QHS    SUBJECTIVE: She is feeling nauseated. Pain is tolerable.    Review of Systems: Review of Systems  Constitutional: Negative for chills and fever.  HENT: Negative for tinnitus.   Eyes: Negative for blurred vision and photophobia.  Respiratory: Negative for cough and sputum production.   Cardiovascular: Negative for chest pain.  Gastrointestinal: Positive for nausea. Negative for diarrhea and vomiting.  Genitourinary: Negative for dysuria.  Musculoskeletal: Positive for back pain.  Skin: Negative for rash.  Neurological: Negative for headaches.    Allergies  Allergen Reactions  . Amlodipine Anaphylaxis, Swelling and Other (See Comments)    Tongue swelling   . Naltrexone Hives  . Penicillins Hives and Other (See Comments)    Tolerated ceftriaxone and cefepime  06/2017 Hives      OBJECTIVE: Vitals:   08/04/20 0345 08/04/20 0654 08/04/20 0811 08/04/20 1113  BP: (!) 136/58  121/60 (!) 127/56  Pulse: 74 72 76 74  Resp: '15 14 16 16  '$ Temp: 98.1 F (36.7 C)  98.1 F (36.7 C) 97.8 F (36.6 C)  TempSrc: Oral  Oral Oral  SpO2: 98% 100% 98% 100%  Weight:  84.4 kg     Body mass index is 30.03 kg/m.  Physical Exam HENT:     Mouth/Throat:     Mouth: No oral lesions.     Dentition: No dental abscesses.  Cardiovascular:     Rate and Rhythm: Normal rate and regular rhythm.     Heart sounds: Normal heart sounds.  Pulmonary:     Effort: Pulmonary effort is normal.     Breath sounds: Normal breath sounds.  Abdominal:     General: There is no distension.     Palpations: Abdomen is soft.     Tenderness: There is no abdominal tenderness.  Musculoskeletal:        General: No tenderness. Normal range of motion.     Comments: Wound vac to L spine intact and engaged correctly with serosanguinous drainage.   Lymphadenopathy:     Cervical: No cervical adenopathy.  Skin:    General: Skin is warm and dry.     Findings: No rash.  Neurological:     Mental Status: She is alert and oriented to person, place, and time.  Psychiatric:        Judgment: Judgment normal.     Lab Results Lab Results  Component Value Date   WBC 8.5 08/02/2020   HGB 12.0 08/02/2020   HCT 38.0 08/02/2020   MCV 91.6 08/02/2020   PLT 232 08/02/2020    Lab Results  Component Value Date   CREATININE 0.66 08/04/2020   BUN 9 08/04/2020   NA 137 08/04/2020   K 3.5 08/04/2020   CL 101 08/04/2020   CO2 27 08/04/2020    Lab Results  Component Value Date   ALT 12 08/02/2020   AST 9 (L) 08/02/2020   ALKPHOS 98 08/02/2020   BILITOT 0.8 08/02/2020     Microbiology: Recent Results (from the past 240 hour(s))  Blood culture (routine x 2)     Status: None (Preliminary result)   Collection Time: 07/31/20 11:57 AM   Specimen: BLOOD RIGHT HAND  Result Value Ref Range  Status   Specimen Description BLOOD RIGHT HAND  Final   Special Requests   Final    BOTTLES DRAWN AEROBIC AND ANAEROBIC Blood Culture adequate volume   Culture   Final    NO GROWTH 4 DAYS Performed at [facility], [place] [address], [place], [place] [address]    Report Status PENDING  Incomplete  Blood culture (routine x 2)  Status: None (Preliminary result)   Collection Time: 07/31/20  2:18 PM   Specimen: BLOOD  Result Value Ref Range Status   Specimen Description BLOOD RIGHT ANTECUBITAL  Final   Special Requests   Final    BOTTLES DRAWN AEROBIC AND ANAEROBIC Blood Culture results may not be optimal due to an inadequate volume of blood received in culture bottles   Culture   Final    NO GROWTH 4 DAYS Performed at [facility], [place] [address], [place], [place] [address]    Report Status PENDING  Incomplete  SARS Coronavirus 2 by RT PCR (hospital order, performed in [facility] hospital lab) Nasopharyngeal Nasopharyngeal Swab     Status: None   Collection Time: 07/31/20  4:09 PM   Specimen: Nasopharyngeal Swab  Result Value Ref Range Status   SARS Coronavirus 2 NEGATIVE NEGATIVE Final    Comment: (NOTE) SARS-CoV-2 target nucleic acids are NOT DETECTED.  The SARS-CoV-2 RNA is generally detectable in upper and lower respiratory specimens during the acute phase of infection. The lowest concentration of SARS-CoV-2 viral copies this assay can detect is 250 copies / mL. A negative result does not preclude SARS-CoV-2 infection and should not be used as the sole basis for treatment or other patient management decisions.  A negative result may occur with improper specimen collection / handling, submission of specimen other than nasopharyngeal swab, presence of viral mutation(s) within the areas targeted by this assay, and inadequate number of viral copies (<250 copies / mL). A negative result must be combined with clinical observations, patient history, and epidemiological  information.  Fact Sheet for Patients:   StrictlyIdeas.no  Fact Sheet for Healthcare Providers: BankingDealers.co.za  This test is not yet approved or  cleared by the [place] FDA and has been authorized for detection and/or diagnosis of SARS-CoV-2 by FDA under an Emergency Use Authorization (EUA).  This EUA will remain in effect (meaning this test can be used) for the duration of the COVID-19 declaration under Section 564(b)(1) of the Act, 21 U.S.C. section 360bbb-3(b)(1), unless the authorization is terminated or revoked sooner.  Performed at [facility], [place] [address], [place], [place] [address]   MRSA PCR Screening     Status: Abnormal   Collection Time: 08/01/20  7:16 AM   Specimen: Nasal Mucosa; Nasopharyngeal  Result Value Ref Range Status   MRSA by PCR POSITIVE (A) NEGATIVE Final    Comment:        The GeneXpert MRSA Assay (FDA approved for NASAL specimens only), is one component of a comprehensive MRSA colonization surveillance program. It is not intended to diagnose MRSA infection nor to guide or monitor treatment for MRSA infections. RESULT CALLED TO, READ BACK BY AND VERIFIED WITH: A. Lavway RN 10:50 08/01/20 ([email or web address]) Performed at [facility], [place] [address] [place], [place] [address]   Fungus Culture With Stain     Status: None (Preliminary result)   Collection Time: 08/02/20 12:01 PM   Specimen: Soft Tissue, Other  Result Value Ref Range Status   Fungus Stain Final report  Final    Comment: (NOTE) Performed At: [facility] [place], [place] [id number] Rush Farmer MD UX:[id number]    Fungus (Mycology) Culture PENDING  Incomplete   Fungal Source WOUND  Final    Comment: LUMBAR Performed at [facility], [place] [address] [place], [place] [address]   Aerobic/Anaerobic Culture (surgical/deep wound)     Status: None (Preliminary result)  Collection Time:  08/02/20 12:01 PM   Specimen: Soft Tissue, Other  Result Value Ref Range Status   Specimen Description WOUND LUMBAR  Final   Special Requests NONE  Final   Gram Stain NO WBC SEEN NO ORGANISMS SEEN   Final   Culture   Final    NO GROWTH 2 DAYS NO ANAEROBES ISOLATED; CULTURE IN PROGRESS FOR 5 DAYS Performed at [facility], [address], [place], [place] [address]    Report Status PENDING  Incomplete  Fungus Culture Result     Status: None   Collection Time: 08/02/20 12:01 PM  Result Value Ref Range Status   Result 1 Comment  Final    Comment: (NOTE) KOH/Calcofluor preparation:  no fungus observed. Performed At: [facility] [address] [place], [place] [id number] Rush Farmer MD [id number]      Janene Madeira, MSN, NP-C [facility] Infect[facility] Disease [email or web address] Pager: [phone] Office: [phone] [place]: [phone]

## 2020-08-04 NOTE — Progress Notes (Signed)
PROGRESS NOTE    Brenda CorollaVenetia Byas  ZOX:[id number] DOB: 06/13/1956 DOA: 07/31/2020   PCP: Patient, No Pcp Per   Brief Narrative:  Brenda Morgan is a 64 y.o. female with medical history significant of COPD, essential hypertension, diabetes mellitus type 2 without complication, fatty liver, and sleep apnea presents with complaints of worsening back pain.  Patient had underwent L4-L5 decompression and fusion on 7/6 by Dr. Conchita ParisNundkumar.[name].  She was readmitted for postoperative back pain with acute kidney injury from 7/14-7/20 treated with steroids and PT/OT.  Seen at urgent care on 7/31 for worsening pain after reporting drainage from the wound site.  She was given Rocephin and started on doxycycline at that time.  She was seen by Dr. Nundkumar 3 days ago and the wound appeared to be getting better.  He started her on a 10-day course of Bactrim, but wanted to get a MRI lumbar spine for further evaluation.  However, patient was noted to be more lethargic with worsening back pain over the last day. Purulent drainage and redness was noted around the surgical site.  She complains of decreased sensation on the left leg and was globally weak. Patient denies any fever or recent falls. Dr. Theola Sequinawely neurosurgery was formally consulted.    Patient is admitted for sepsis secondary to postoperative wound infection.  Patient is started on IV antibiotics.  Patient underwent washout and wound VAC placement on 8/10, tolerated well.  Patient may require wound closure pending wound status on Friday or Monday.  Infectious disease consulted,  Patient has received PICC line needs vancomycin and ceftriaxone for 6 weeks.  Assessment & Plan:   Principal Problem:   Postoperative wound infection Active Problems:   Postoperative back pain   COPD (chronic obstructive pulmonary disease) (HCC)   DM II (diabetes mellitus, type II), controlled (HCC)   HTN (hypertension)   Infection of lumbar spine (HCC)   Hardware complicating wound  infection (HCC)   Sepsis secondary to postoperative wound infection: Acute.   Patient was noted to be tachycardic and tachypneic with WBC elevated at 19 and lactic acid initially 2.6.   Lumbar spine noted to be likely source of infection with purulent drainage discharge reported.   Patient reporting decrease in sensation on the right lower extremity. - Admitted to telemetry - Sepsis protocol initiated. - Blood cultures : No growth so far - MRI of the lumbar spine : Marrow edema within L4-L5 vertebral body - Continue empiric antibiotics of vancomycin - UA , CXR : unremarkable. - 2 L of lactated Ringer's given - Neurosurgery consulted .  S/P Washout and placement of wound VAC on Tuesday. -Patient may require wound closure pending wound status on Friday or Monday. -Infectious disease consulted,  recommended vancomycin for 6-week. - Patient has received PICC line.  ID outpatient follow-up arranged. - Awaiting neurosurgery plan for wound care going forward.   Diabetes mellitus type 2:  Last hemoglobin A1c 6.7 on 06/24/2020.   Blood glucose appears to be relatively well controlled.   Home medications include NovoLog 70/30 mix 65 units 2 times daily, Smaglutide 1 mg every Sunday, and Empagliflozin-Metformin 25-1000 mg daily. -Hypoglycemic protocol -CBGs before every meal with sensitive SSI  -Reduced 70/30 mix to 30 units twice daily  Essential hypertension: controlled. Blood pressures currently relatively well controlled. -Continue Coreg -Will need to restart rest of patient's home regimen as tolerated when medically appropriate.  Chronic kidney disease stage IIIa:  Creatinine mildly elevated at 1.17 with BUN 24 to suggest patient dehydrated.  Baseline creatinine appears to be around 1. -Recheck kidney function in a.m.  Hyperlipidemia -Continue atorvastatin.    DVT prophylaxis: Lovenox Code Status: Full Family Communication: Husband updated at bedside. Disposition Plan:   Home with home PT, nursing and aide. Consults called: Neurosurgery Admission status: Inpatient. Patient is not medically clear, requiring wound care and IV antibiotic. Patient has a PICC line needs IV antibiotic for 6 weeks outpatient antibiotic arrangement has been arranged.  Awaiting neurosurgery clearance.   Consultants:    Neurosurgery  Procedures: Antimicrobials:  Anti-infectives (From admission, onward)   Start     Dose/Rate Route Frequency Ordered Stop   08/04/20 1200  cefTRIAXone (ROCEPHIN) 2 g in sodium chloride 0.9 % 100 mL IVPB     Discontinue     2 g 200 mL/hr over 30 Minutes Intravenous Every 24 hours 08/04/20 0953     08/03/20 1400  rifabutin (MYCOBUTIN) capsule 300 mg     Discontinue     300 mg Oral Daily 08/03/20 0946     08/02/20 1216  bacitracin 50,000 Units in sodium chloride 0.9 % 500 mL irrigation  Status:  Discontinued          As needed 08/02/20 1216 08/02/20 1251   08/02/20 0830  sodium chloride 0.9 % 1,000 mL with bacitracin 100,000 Units infusion        10 mL/hr  Irrigation To Surgery 08/02/20 0829 08/03/20 0830   08/02/20 0700  vancomycin (VANCOCIN) IVPB 1000 mg/200 mL premix  Status:  Discontinued        1,000 mg 200 mL/hr over 60 Minutes Intravenous On call to O.R. 08/02/20 0602 08/02/20 1326   08/02/20 0000  rifabutin (MYCOBUTIN) 150 MG capsule     Discontinue     300 mg Oral Daily 08/02/20 1604     08/01/20 1600  vancomycin (VANCOCIN) IVPB 1000 mg/200 mL premix     Discontinue     1,000 mg 200 mL/hr over 60 Minutes Intravenous Every 12 hours 08/01/20 1417     08/01/20 0400  vancomycin (VANCOREADY) IVPB 750 mg/150 mL  Status:  Discontinued        750 mg 150 mL/hr over 60 Minutes Intravenous Every 12 hours 07/31/20 1559 08/01/20 1417   07/31/20 1600  vancomycin (VANCOREADY) IVPB 500 mg/100 mL        500 mg 100 mL/hr over 60 Minutes Intravenous  Once 07/31/20 1559 07/31/20 1833   07/31/20 1400  vancomycin (VANCOCIN) IVPB 1000 mg/200 mL premix         1,000 mg 200 mL/hr over 60 Minutes Intravenous  Once 07/31/20 1357 07/31/20 1543      Subjective: Patient was seen and examined at bedside.  No overnight events.  Patient reports feeling better, she reports pain is better controlled now. Objective: Vitals:   08/04/20 0345 08/04/20 0654 08/04/20 0811 08/04/20 1113  BP: (!) 136/58  121/60 (!) 127/56  Pulse: 74 72 76 74  Resp: Temp: 98.1 F (36.7 C)  98.1 F (36.7 C) 97.8 F (36.6 C)  TempSrc: Oral  Oral Oral  SpO2: 98% 100% 98% 100%  Weight:  84.4 kg      Intake/Output Summary (Last 24 hours) at 08/04/2020 1451 Last data filed at 08/04/2020 0845 Gross per 24 hour  Intake 260 ml  Output 10 ml  Net 250 ml   Filed Weights   08/04/20 0654  Weight: 84.4 kg    Examination:  General exam: Appears calm and comfortable. Respiratory  system: Clear to auscultation. Respiratory effort normal. Cardiovascular system: S1 & S2 heard, RRR. No JVD, murmurs, rubs, gallops or clicks. No pedal edema. Gastrointestinal system: Abdomen is nondistended, soft and nontender. No organomegaly or masses felt. Normal bowel sounds heard. Central nervous system: Alert and oriented. No focal neurological deficits. Extremities: No leg swelling, no cyanosis, no edema. Skin: No rashes, lesions or ulcers Psychiatry: Judgement and insight appear normal. Mood & affect appropriate.     Data Reviewed: I have personally reviewed following labs and imaging studies  CBC: Recent Labs  Lab 07/31/20 1153 08/01/20 0542 08/02/20 0413  WBC 19.0* 11.2* 8.5  NEUTROABS 14.4*  --   --   HGB 13.3 11.3* 12.0  HCT 43.0 35.5* 38.0  MCV 94.1 92.4 91.6  PLT 256 187 232   Basic Metabolic Panel: Recent Labs  Lab 07/31/20 1153 08/01/20 0542 08/02/20 0413 08/03/20 1600 08/04/20 0415  NA 140 137 137 135 137  K 3.7 3.4* 3.7 3.5 3.5  CL 95* 96* 98 98 101  CO2 29 28 29 28 27   GLUCOSE 163* 108* 130* 168* 125*  BUN 24* 14 12 10 9   CREATININE 1.17* 0.82  0.76 0.68 0.66  CALCIUM 9.8 8.9 9.2 9.2 9.1  MG  --   --  1.6* 1.8 1.5*  PHOS  --   --  3.3 3.1 3.6   GFR: Estimated Creatinine Clearance: 79.8 mL/min (by C-G formula based on SCr of 0.66 mg/dL). Liver Function Tests: Recent Labs  Lab 07/31/20 1153 08/02/20 0413  AST 15 9*  ALT 13 12  ALKPHOS 131* 98  BILITOT 0.5 0.8  PROT 7.5 6.3*  ALBUMIN 3.3* 2.7*   No results for input(s): LIPASE, AMYLASE in the last 168 hours. No results for input(s): AMMONIA in the last 168 hours. Coagulation Profile: Recent Labs  Lab 08/02/20 1022  INR 1.1   Cardiac Enzymes: No results for input(s): CKTOTAL, CKMB, CKMBINDEX, TROPONINI in the last 168 hours. BNP (last 3 results) No results for input(s): PROBNP in the last 8760 hours. HbA1C: No results for input(s): HGBA1C in the last 72 hours. CBG: Recent Labs  Lab 08/03/20 1135 08/03/20 1713 08/03/20 2015 08/04/20 0810 08/04/20 1119  GLUCAP 103* 156* 174* 167* 142*   Lipid Profile: No results for input(s): CHOL, HDL, LDLCALC, TRIG, CHOLHDL, LDLDIRECT in the last 72 hours. Thyroid Function Tests: No results for input(s): TSH, T4TOTAL, FREET4, T3FREE, THYROIDAB in the last 72 hours. Anemia Panel: No results for input(s): VITAMINB12, FOLATE, FERRITIN, TIBC, IRON, RETICCTPCT in the last 72 hours. Sepsis Labs: Recent Labs  Lab 07/31/20 1153 07/31/20 1558 07/31/20 2113  LATICACIDVEN 2.6* 1.5 1.4    Recent Results (from the past 240 hour(s))  Blood culture (routine x 2)     Status: None (Preliminary result)   Collection Time: 07/31/20 11:57 AM   Specimen: BLOOD RIGHT HAND  Result Value Ref Range Status   Specimen Description BLOOD RIGHT HAND  Final   Special Requests   Final    BOTTLES DRAWN AEROBIC AND ANAEROBIC Blood Culture adequate volume   Culture   Final    NO GROWTH 4 DAYS Performed at [facility], [address], [place], [address] [place]    Report Status PENDING  Incomplete  Blood culture (routine x 2)      Status: None (Preliminary result)   Collection Time: 07/31/20  2:18 PM   Specimen: BLOOD  Result Value Ref Range Status   Specimen Description BLOOD RIGHT ANTECUBITAL  Final   Special Requests   Final    BOTTLES DRAWN AEROBIC AND ANAEROBIC Blood Culture results may not be optimal due to an inadequate volume of blood received in culture bottles   Culture   Final    NO GROWTH 4 DAYS Performed at [facility], [address], [place], [place] [address]    Report Status PENDING  Incomplete  SARS Coronavirus 2 by RT PCR (hospital order, performed in [facility] hospital lab) Nasopharyngeal Nasopharyngeal Swab     Status: None   Collection Time: 07/31/20  4:09 PM   Specimen: Nasopharyngeal Swab  Result Value Ref Range Status   SARS Coronavirus 2 NEGATIVE NEGATIVE Final    Comment: (NOTE) SARS-CoV-2 target nucleic acids are NOT DETECTED.  The SARS-CoV-2 RNA is generally detectable in upper and lower respiratory specimens during the acute phase of infection. The lowest concentration of SARS-CoV-2 viral copies this assay can detect is 250 copies / mL. A negative result does not preclude SARS-CoV-2 infection and should not be used as the sole basis for treatment or other patient management decisions.  A negative result may occur with improper specimen collection / handling, submission of specimen other than nasopharyngeal swab, presence of viral mutation(s) within the areas targeted by this assay, and inadequate number of viral copies (<250 copies / mL). A negative result must be combined with clinical observations, patient history, and epidemiological information.  Fact Sheet for Patients:   BoilerBrush.com.cy  Fact Sheet for Healthcare Providers: https://pope.com/  This test is not yet approved or  cleared by the [place] FDA and has been authorized for detection and/or diagnosis of SARS-CoV-2 by FDA under an Emergency Use  Authorization (EUA).  This EUA will remain in effect (meaning this test can be used) for the duration of the COVID-19 declaration under Section 564(b)(1) of the Act, 21 U.S.C. section 360bbb-3(b)(1), unless the authorization is terminated or revoked sooner.  Performed at [facility], [address] [place], [place] [address]   MRSA PCR Screening     Status: Abnormal   Collection Time: 08/01/20  7:16 AM   Specimen: Nasal Mucosa; Nasopharyngeal  Result Value Ref Range Status   MRSA by PCR POSITIVE (A) NEGATIVE Final    Comment:        The GeneXpert MRSA Assay (FDA approved for NASAL specimens only), is one component of a comprehensive MRSA colonization surveillance program. It is not intended to diagnose MRSA infection nor to guide or monitor treatment for MRSA infections. RESULT CALLED TO, READ BACK BY AND VERIFIED WITH: A. Lavway RN 10:50 08/01/20 ([email or web address]) Performed at [facility], [address] [place], [place] [address]   Fungus Culture With Stain     Status: None (Preliminary result)   Collection Time: 08/02/20 12:01 PM   Specimen: Soft Tissue, Other  Result Value Ref Range Status   Fungus Stain Final report  Final    Comment: (NOTE) Performed At: [facility] [address] [place], [place] [id number] Jolene Schimke MD GE:[id number]    Fungus (Mycology) Culture PENDING  Incomplete   Fungal Source WOUND  Final    Comment: LUMBAR Performed at [facility], [address] [place], [place] [address]   Aerobic/Anaerobic Culture (surgical/deep wound)     Status: None (Preliminary result)   Collection Time: 08/02/20 12:01 PM   Specimen: Soft Tissue, Other  Result Value Ref Range Status   Specimen Description WOUND LUMBAR  Final   Special  Requests NONE  Final   Gram Stain NO WBC SEEN NO ORGANISMS SEEN   Final   Culture   Final    NO GROWTH 2 DAYS NO ANAEROBES ISOLATED; CULTURE IN PROGRESS FOR 5 DAYS Performed at [facility],  [address], [place], [place] [address]    Report Status PENDING  Incomplete  Fungus Culture Result     Status: None   Collection Time: 08/02/20 12:01 PM  Result Value Ref Range Status   Result 1 Comment  Final    Comment: (NOTE) KOH/Calcofluor preparation:  no fungus observed. Performed At: [facility] [address] [place], [place] [id number] Jolene Schimke MD [id number]      Radiology Studies: [place] EKG SITE RITE  Result Date: 08/02/2020 If Site Rite image not attached, placement could not be confirmed due to current cardiac rhythm.   Scheduled Meds: . Chlorhexidine Gluconate Cloth  6 each Topical Q0600  . feeding supplement (ENSURE ENLIVE)  237 mL Oral BID BM  . heparin injection (subcutaneous)  5,000 Units Subcutaneous Q12H  . insulin aspart  0-15 Units Subcutaneous TID WC  . insulin aspart  0-5 Units Subcutaneous QHS  . mupirocin ointment  1 application Nasal BID  . pramipexole  1 mg Oral QHS  . rifabutin  300 mg Oral Daily  . sodium chloride flush  10-40 mL Intracatheter Q12H  . sodium chloride flush  3 mL Intravenous Once  . sodium chloride flush  3 mL Intravenous Q12H  . sodium chloride flush  3 mL Intravenous Q12H  . traZODone  75 mg Oral QHS   Continuous Infusions: . sodium chloride    . cefTRIAXone (ROCEPHIN)  IV 2 g (08/04/20 1220)  . lactated ringers 10 mL/hr at 08/02/20 0844  . methocarbamol (ROBAXIN) IV    . vancomycin 1,000 mg (08/04/20 0414)     LOS: 4 days    Time spent: 25 mins.   Cipriano Bunker, MD [facility]   If 7PM-7AM, please contact night-coverage

## 2020-08-04 NOTE — Progress Notes (Signed)
Occupational Therapy Treatment Patient Details Name: Brenda Morgan MRN: [id number] DOB: Oct 17, 1956 Today's Date: 08/04/2020    History of present illness Pt is a 64 y.o. F with significant PMH of COPD, HTN, DM2, recent L4-5 decompression and fusion 7/6, readmission for postoperative back pain and acute kidney injury 7/14-7/20, who presents with worsening pain and drainage from wound site. Admitted with sepsis secondary to postoperative wound infection. S/p I&D of lumbar wound and placement of wound vac 08/02/2020.   OT comments  Patient continues to make steady progress towards goals in skilled OT session. Patient's session encompassed education with regard to AE provided from home via husband. Pt able to demonstrate with teach back for each piece of equipment, with the exception of the sock aid as pt was progressively experiencing increased pain in BLEs throughout session. RN notified at the end of session for need of pain meds. Discharge remains appropriate at this time, will continue to follow acutely.   Follow Up Recommendations  No OT follow up;Supervision - Intermittent    Equipment Recommendations  None recommended by OT    Recommendations for Other Services      Precautions / Restrictions Precautions Precautions: Back;Other (comment) Precaution Booklet Issued: No Precaution Comments: Wound vac       Mobility Bed Mobility               General bed mobility comments: Up in chair on arrival  Transfers                 General transfer comment: Deferred due to pain level    Balance                                           ADL either performed or assessed with clinical judgement   ADL Overall ADL's : Needs assistance/impaired                                     Functional mobility during ADLs: Min guard General ADL Comments: Session focus on AE education     Vision       Perception     Praxis      Cognition  Arousal/Alertness: Awake/alert Behavior During Therapy: WFL for tasks assessed/performed Overall Cognitive Status: Within Functional Limits for tasks assessed                                          Exercises     Shoulder Instructions       General Comments      Pertinent Vitals/ Pain       Pain Assessment: Faces Faces Pain Scale: Hurts whole lot Pain Location: bilateral legs Pain Descriptors / Indicators: Aching;Burning;Cramping;Discomfort;Grimacing Pain Intervention(s): Limited activity within patient's tolerance;Monitored during session;Repositioned;Patient requesting pain meds-RN notified  Home Living                                          Prior Functioning/Environment              Frequency  Min 2X/week        Progress Toward Goals  OT  Goals(current goals can now be found in the care plan section)  Progress towards OT goals: Progressing toward goals  Acute Rehab OT Goals Patient Stated Goal: to be able to go home soon OT Goal Formulation: With patient Time For Goal Achievement: 08/17/20 Potential to Achieve Goals: Good  Plan Discharge plan remains appropriate    Co-evaluation                 AM-PAC OT "6 Clicks" Daily Activity     Outcome Measure   Help from another person eating meals?: None Help from another person taking care of personal grooming?: A Little Help from another person toileting, which includes using toliet, bedpan, or urinal?: A Lot Help from another person bathing (including washing, rinsing, drying)?: A Lot Help from another person to put on and taking off regular upper body clothing?: A Little Help from another person to put on and taking off regular lower body clothing?: A Lot 6 Click Score: 16    End of Session Equipment Utilized During Treatment: Other (comment) (Other AE provided by husband)  OT Visit Diagnosis: Unsteadiness on feet (R26.81);Pain;Muscle weakness (generalized)  (M62.81)   Activity Tolerance Patient limited by pain   Patient Left in chair;with call bell/phone within reach;with family/visitor present   Nurse Communication Mobility status;Patient requests pain meds        Time: 1145-1201 OT Time Calculation (min): 16 min  Charges: OT General Charges $OT Visit: 1 Visit OT Treatments $Self Care/Home Management : 8-22 mins  Pollyann Glen E. Jarris Kortz, COTA/L Acute Rehabilitation Services [phone]   Cherlyn Cushing 08/04/2020, 3:50 PM

## 2020-08-04 NOTE — Progress Notes (Addendum)
PHARMACY CONSULT NOTE FOR:  OUTPATIENT  PARENTERAL ANTIBIOTIC THERAPY (OPAT)  Indication: Post-op wound infection  Regimen: Vancomycin 1000 mg every 12 hours + Ceftriaxone 2 gm every 24 hours + rifabutin  300 mg daily End date: 09/13/20  IV antibiotic discharge orders are pended. To discharging provider:  please sign these orders via discharge navigator,  Select New Orders & click on the button choice - Manage This Unsigned Work.     Thank you for allowing pharmacy to be a part of this patient's care.  Sharin Mons, PharmD, BCPS, BCIDP Infectious Diseases Clinical Pharmacist Phone: [phone] 08/04/2020, 11:37 AM

## 2020-08-04 NOTE — TOC Progression Note (Signed)
Transition of Care (TOC) - Progression Note  Donn Pierini RN,BSN Transitions of Care Unit 4NP (non trauma) - RN Case Manager See Treatment Team for direct Phone #   Patient Details  Name: Vanilla Heatherington MRN: [id number] Date of Birth: 1956-09-17  Transition of Care [facility]) CM/SW Contact  Zenda Alpers Lenn Sink, RN Phone Number: 08/04/2020, 3:13 PM  Clinical Narrative:    Follow up done with pt and spouse at bedside for transition plans. Per pt plan is to return home with [facility] at this time- Pt is active with [facility] and would like to continue services with them as long as they can provide the needed services (RN/PT/OT)- have placed call on 8/11 and faxed orders as per their request- will f/u today to see if they know if RN services can be confirmed for available staffing starting next week for IV abx needs and possible home wound VAC (MD notes not clear yet on plan for wound care- pt currently with wound VAC).  If [facility] not able to provide RN needs- then discussed alternate HH option- per spouse- choice would be [facility] contact Ilean Skill- received a call from [facility] regarding pt and HH needs- Judeth Cornfield aware that pt is currently active with [facility] and awaiting to see if they can provide needs- if not then [facility] states they have availability for RN/PT/OT needs for [facility] next week and will confirm insurance if needed. (Stephanie's Contact if needed- [phone], office-[phone](Cassie)- fax- [phone])  Have again placed call to [facility] to f/u on yesterday's call- awaiting return call.   Will place [facility] wound VAC form on shadow chart- if wound VAC needed for home- will need to be signed to begin process for insurance approval and home [facility] delivery.   Also spoke with Pam with [facility] as pt and spouse agreeable to use them for home IV abx needs in coordination with [facility] agency. Pam will reach out to spouse to set up bedside education for home IV  abx needs   Spouse contact # - [phone]   Expected Discharge Plan: Home w Home Health Services Barriers to Discharge: Continued Medical Work up  Expected Discharge Plan and Services Expected Discharge Plan: Home w Home Health Services   Discharge Planning Services: CM Consult Post Acute Care Choice: Home Health, Resumption of Svcs/PTA Provider Living arrangements for the past 2 months: Mobile Home                           HH Arranged: RN, PT, OT [facility] Agency: [facility] Date HH Agency Contacted: 08/03/20 Time HH Agency Contacted: 1200 Representative spoke with at [facility] Agency: [place]   Social Determinants of Health (SDOH) Interventions    Readmission Risk Interventions No flowsheet data found.

## 2020-08-05 LAB — BASIC METABOLIC PANEL
Anion gap: 10 (ref 5–15)
BUN: 8 mg/dL (ref 8–23)
CO2: 27 mmol/L (ref 22–32)
Calcium: 9.2 mg/dL (ref 8.9–10.3)
Chloride: 101 mmol/L (ref 98–111)
Creatinine, Ser: 0.71 mg/dL (ref 0.44–1.00)
GFR calc Af Amer: >60 mL/min (ref >60)
GFR calc non Af Amer: >60 mL/min (ref >60)
Glucose, Bld: 150 mg/dL — ABNORMAL HIGH (ref 70–99)
Potassium: 3.3 mmol/L — ABNORMAL LOW (ref 3.5–5.1)
Sodium: 138 mmol/L (ref 135–145)

## 2020-08-05 LAB — CULTURE, BLOOD (ROUTINE X 2)
Culture: NO GROWTH
Culture: NO GROWTH
Special Requests: ADEQUATE

## 2020-08-05 LAB — MAGNESIUM: Magnesium: 1.7 mg/dL (ref 1.7–2.4)

## 2020-08-05 LAB — GLUCOSE, CAPILLARY
Glucose-Capillary: 164 mg/dL — ABNORMAL HIGH (ref 70–99)
Glucose-Capillary: 255 mg/dL — ABNORMAL HIGH (ref 70–99)
Glucose-Capillary: 115 mg/dL — ABNORMAL HIGH (ref 70–99)

## 2020-08-05 LAB — CBC
HCT: 36.8 % (ref 36.0–46.0)
Hemoglobin: 11.3 g/dL — ABNORMAL LOW (ref 12.0–15.0)
MCH: 28.4 pg (ref 26.0–34.0)
MCHC: 30.7 g/dL (ref 30.0–36.0)
MCV: 92.5 fL (ref 80.0–100.0)
Platelets: 272 10*3/uL (ref 150–400)
RBC: 3.98 MIL/uL (ref 3.87–5.11)
RDW: 14.7 % (ref 11.5–15.5)
WBC: 6.4 10*3/uL (ref 4.0–10.5)
nRBC: 0 % (ref 0.0–0.2)

## 2020-08-05 MED ORDER — POTASSIUM CHLORIDE 20 MEQ PO PACK
40.0000 meq | PACK | Freq: Once | ORAL | Status: AC
  Administered 2020-08-05: 40 meq via ORAL
  Filled 2020-08-05: qty 2

## 2020-08-05 MED ORDER — VANCOMYCIN IV (FOR PTA / DISCHARGE USE ONLY)
1000.0000 mg | Freq: Two times a day (BID) | INTRAVENOUS | 0 refills | Status: AC
Start: 2020-08-05 — End: 2020-09-14

## 2020-08-05 MED ORDER — CEFTRIAXONE IV (FOR PTA / DISCHARGE USE ONLY)
2.0000 g | INTRAVENOUS | 0 refills | Status: AC
Start: 2020-08-05 — End: 2020-09-14

## 2020-08-05 NOTE — Progress Notes (Signed)
Pt seen on rounds this morning, was transferring from bed to chair, wound vac in place with good seal. No neurologic complaints.   -Discussed w/ Dr. Conchita Paris, neurosurgical plan of care is to continue the wound vac and allow for secondary intention, no further surgery planned at this time. Okay for discharge from a neurosurgery perspective as soon as medically appropriate per primary team.

## 2020-08-05 NOTE — Progress Notes (Signed)
[facility]    Date of Admission:  07/31/2020   Total days of antibiotics 6           ID: Brenda Morgan is a 64 y.o. 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 Chlorhexidine Gluconate Cloth  6 each Topical Q0600  . feeding supplement (ENSURE ENLIVE)  237 mL Oral BID BM  . heparin injection (subcutaneous)  5,000 Units Subcutaneous Q12H  . insulin aspart  0-15 Units Subcutaneous TID WC  . insulin aspart  0-5 Units Subcutaneous QHS  . mupirocin ointment  1 application Nasal BID  . pramipexole  1 mg Oral QHS  . rifabutin  300 mg Oral Daily  . sodium chloride flush  10-40 mL Intracatheter Q12H  . sodium chloride flush  3 mL Intravenous Once  . sodium chloride flush  3 mL Intravenous Q12H  . sodium chloride flush  3 mL Intravenous Q12H  . traZODone  75 mg Oral QHS    Objective: Vital signs in last 24 hours: Temp:  [97.6 F (36.4 C)-97.9 F (36.6 C)] 97.6 F (36.4 C) (08/13 1128) Pulse Rate:  [70-90] 90 (08/13 1128) Resp:  [16-19] 18 (08/13 1128) BP: (119-124)/(57-70) 124/60 (08/13 1128) SpO2:  [94 %-99 %] 97 % (08/13 1128) Weight:  [87.7 kg] 87.7 kg (08/13 0527) Physical Exam  Constitutional:  oriented to person, place, and time. appears well-developed and well-nourished. No distress.  HENT: [place]/AT, PERRLA, no scleral icterus Mouth/Throat: Oropharynx is clear and moist. No oropharyngeal exudate.  Cardiovascular: Normal rate, regular rhythm and normal heart sounds. Exam reveals no gallop and no friction rub.   No murmur heard.  Pulmonary/Chest: Effort normal and breath sounds normal. No respiratory distress.  has no wheezes.  Neck = supple, no nuchal rigidity Abdominal: Soft. Bowel sounds are normal.  exhibits no distension. There is no tenderness.  Back= wound vac in place Lymphadenopathy: no cervical adenopathy. No axillary adenopathy Neurological: alert and oriented to person, place, and time.  Skin: Skin is warm and dry. No rash noted. No erythema.  Psychiatric: a normal mood and affect.  behavior is normal.    Lab Results Recent Labs    08/04/20 0415 08/05/20 0611  WBC  --  6.4  HGB  --  11.3*  HCT  --  36.8  NA 137 138  K 3.5 3.3*  CL 101 101  CO2 27 27  BUN 9 8  CREATININE 0.66 0.71   Liver Panel No results for input(s): PROT, ALBUMIN, AST, ALT, ALKPHOS, BILITOT, BILIDIR, IBILI in the last 72 hours. Sedimentation Rate Recent Labs    08/03/20 1600  ESRSEDRATE 62*   C-Reactive Protein Recent Labs    08/03/20 1600  CRP 1.8*    Microbiology:  no growth at day 3 Studies/Results: No results found.   Assessment/Plan: Post operative lumbar wound infection with HW involvement = cultures are negative thus far. Will plan to treat with vancomycin plus ceftriaxone and rifabutin to help minimize risk of biofilm associated with staphylococcal infections. She has been on abtx prior to  debridement thus unclear if we will isolate any pathogens  abtx administration teaching today  Nausea = appears resolved  Will see back in  3-4 wk    [facility] for Infectious Diseases Cell: [phone] Pager: [phone]  08/05/2020, 5:55 PM

## 2020-08-05 NOTE — TOC Progression Note (Addendum)
Transition of Care (TOC) - Progression Note  Donn Pierini RN,BSN Transitions of Care Unit 4NP (non trauma) - RN Case Manager See Treatment Team for direct Phone #   Patient Details  Name: Brenda Morgan MRN: [id number] Date of Birth: 08-Jan-1956  Transition of Care [facility]) CM/SW Contact  Zenda Alpers, Lenn Sink, RN Phone Number: 08/05/2020, 3:49 PM  Clinical Narrative:    Noted Neuro note this AM - regarding no further plans for any more surgery at this time- plan to return home with wound vAC for now. MD has signed [employer] home wound VAC form and this has been faxed to [facility] with [employer] to start process for home wound VAC approval. Pam with [facility] has completed education for home abx needs. OPAT orders to be signed by MD.  Sherron Monday with Florentina Addison at [facility] and confirmed that they will be able to add and provide RN need for wound VAC dsg changes m/w/f and PICC line care, in addition to PT/OT. With Start of care for Monday 8/16.   1345- spoke with French Ana with [employer] they are still working on Therapist, occupational - need additional documentation that states no substitution for wound VAC- MD has provided letter and updated order form with needed documentation- this has been faxed back to [facility]. Approval pending.   1445- Notified by French Ana that they are still working with insurance for approval.   1540- Notified by French Ana that home Wound VAC has been approved by insurance and released for delivery here to hospital- delivery time unknown at this point- but VAC will be delivered today. Pt really wants to go home- and once home VAC here at bedside can be changed over by bedside RN for transition home. Pending on timing could go home later today vs waiting until the AM- home abx will be delivered to the home later this evening. MD aware that home VAC has been approved and pending delivery- to go speak with pt at bedside regarding discharge plan.   D/C summary will need to be faxed to [facility] once ready- to  fax # [phone]   Expected Discharge Plan: Home w Home Health Services Barriers to Discharge: Equipment Delay (pending home wound VAC approval and delivery)  Expected Discharge Plan and Services Expected Discharge Plan: Home w Home Health Services   Discharge Planning Services: CM Consult Post Acute Care Choice: Home Health, Resumption of Svcs/PTA Provider Living arrangements for the past 2 months: Mobile Home                 DME Arranged: Vac DME Agency: [employer] Date DME Agency Contacted: 08/05/20 Time DME Agency Contacted: [phone] Representative spoke with at DME Agency: French Ana HH Arranged: RN, PT, OT [facility] Agency: [facility] Date HH Agency Contacted: 08/05/20 Time HH Agency Contacted: 1100 Representative spoke with at [facility] Agency: Katie   Social Determinants of Health (SDOH) Interventions    Readmission Risk Interventions Readmission Risk Prevention Plan 08/05/2020  Transportation Screening Complete  PCP or Specialist Appt within 3-5 Days Complete  HRI or Home Care Consult Complete  Social Work Consult for Recovery Care Planning/Counseling Complete  Palliative Care Screening Not Applicable  Medication Review [employer]) Complete

## 2020-08-05 NOTE — Discharge Summary (Signed)
Physician Discharge Summary  Owen Pagnotta KDX:[id number] DOB: Apr 20, 1956 DOA: 07/31/2020  PCP: Patient, No Pcp Per  Admit date: 07/31/2020   Discharge date: 08/05/2020  Admitted From: Home  Disposition:   Home with home services.  Recommendations for Outpatient Follow-up:  1. Follow up with PCP in 1-2 weeks. 2. Please obtain BMP/CBC in one week. 3. Advised to follow up Neurosurgery in 2 weeks. 4. Advised to continue ceftriaxone, vancomycin and rifabutin for 6 weeks for culture-negative deep tissue infection. 5. Home health Nurse has been arranged , Wound care for wound vac arranged.  Home Health: Yes. Equipment/Devices: Wound VAC, RN and home health services  Discharge Condition: Stable CODE STATUS:Full code Diet recommendation: Heart Healthy.   Brief Summary: Hiromi Knodel a 64 y.o.femalewith medical history significant ofCOPD,essential hypertension,diabetes mellitus type 2 without complication, fatty liver, and sleep apneapresents with complaints of worsening back pain. Patient had underwent L4-L5 decompression and fusion on 7/6 by Dr.Nundkumar.She was readmitted for postoperative back pain with acute kidney injury from 7/14-7/20treated with steroids and PT/OT.She was seenat urgent care on 7/31 for worsening pain after reporting drainage from the wound site. She was given Rocephin and started on doxycycline at that time. She was seen by Dr. Eda Keys days ago and the wound appeared to be getting better. He started her on a 10-day course of Bactrim,but wanted to get a MRI lumbar spine for further evaluation. However, patient was noted to be more lethargic with worseningbackpain over the last day. Purulent drainage and redness was noted around the surgical site. She complains of decreased sensation on the left leg and was globally weak. Patient denies any fever orrecent falls.  Hospital Course Patient was admitted for sepsis secondary to postoperative wound  infection.  Patient is started on IV antibiotics.  Infectious disease was consulted. Dr. Dawleyneurosurgery was formally consulted.  Patient underwent washout and wound VAC placement on 8/10, tolerated well. Patient has received PICC line, needs vancomycin, rifabutin and ceftriaxone for 6 weeks for culture-negative deep tissue infection.   Cultures have been negative. Neurosurgery signed off,   neurosurgical plan of care is to continue the wound VAC and allow for secondary intention,  no further surgery planned at this time.  Patient is cleared from neurosurgery to be discharged.  Wound care, home health services and nursing has been arranged. Patient will get vancomycin, ceftriaxone and rifabutin as recommended by infectious disease.  Patient will follow up with wound care, infectious disease, neurosurgery in 2 weeks.  She was managed for below problems.  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  Patient was noted to be tachycardic and tachypneic with WBC elevated at 19 and lactic acid initially 2.6.  Lumbar spine noted to be likely source of infection with purulent drainage discharge reported.  Patient reporting decrease in sensation on the right lower extremity. - Admitted to telemetry - Sepsis protocol initiated. - Blood cultures : No growth so far - MRI of the lumbar spine : Marrow edema within L4-L5 vertebral body - Continue empiric antibiotics of vancomycin - UA , CXR : unremarkable. - 2 L of lactated Ringer's given - Neurosurgery consulted .  S/P Washout and placement of wound VAC on Tuesday. -  Infectious disease consulted,  recommended  vancomycin, ceftriaxone and rifabutin for 6-week. - Patient has received PICC line.  ID outpatient follow-up arranged. -Neurosurgery recommended no further surgical intervention at this point. -Wound VAC, IV antibiotics arranged.   Diabetes mellitus type 2:  Last hemoglobin A1c 6.7 on 06/24/2020.  Blood glucose appears to be relatively well controlled.  Home medications include NovoLog 70/30 mix 65 units 2 times daily, Smaglutide1 mg every Sunday, andEmpagliflozin-Metformin 25-1000 mg daily. -Hypoglycemic protocol -CBGs before every meal with sensitive SSI -Reduced 70/30 mix to 30 units twice daily  Essential hypertension: controlled. - Blood pressures currently relatively well controlled. -Continue Coreg -Will need to restart rest of patient's home regimen as tolerated when medically appropriate.  Chronic kidney disease stage IIIa:  >> Resolved. Creatinine mildly elevated at 1.17 with BUN 24 to suggest patient dehydrated.  Baseline creatinine appears to be around 1. -Recheck kidney function in a.m.  Hyperlipidemia -Continue atorvastatin.   Discharge Instructions  Discharge Instructions    [facility] pharmacist to adjust dose for Vancomycin, Aminoglycosides and other anti-infective therapies as requested by physician.   Complete by: As directed    [facility] to provide Cath Flo '2mg'$    Complete by: As directed    Administer for PICC line occlusion and as ordered by physician for other access device issues.   Anaphylaxis Kit: Provided to treat any anaphylactic reaction to the medication being provided to the patient if First Dose or when requested by physician   Complete by: As directed    Epinephrine '1mg'$ /ml vial / amp: Administer 0.'3mg'$  (0.33m) subcutaneously once for moderate to severe anaphylaxis, nurse to call physician and pharmacy when reaction occurs and call 911 if needed for immediate care   Diphenhydramine '50mg'$ /ml IV vial: Administer  25-'50mg'$  IV/IM PRN for first dose reaction, rash, itching, mild reaction, nurse to call physician and pharmacy when reaction occurs   Sodium Chloride 0.9% NS 5038mIV: Administer if needed for hypovolemic blood pressure drop or as ordered by physician after call to physician with anaphylactic reaction   Call MD for:  extreme fatigue   Complete by: As directed    Call MD for:  persistant dizziness or light-headedness   Complete by: As directed    Call MD for:  persistant nausea and vomiting   Complete by: As directed    Call MD for:  redness, tenderness, or signs of infection (pain, swelling, redness, odor or green/yellow discharge around incision site)   Complete by: As directed    Call MD for:  severe uncontrolled pain   Complete by: As directed    Call MD for:  temperature >100.4   Complete by: As directed    Change dressing on IV access line weekly and PRN   Complete by: As directed    Diet - low sodium heart healthy   Complete by: As directed    Discharge instructions   Complete by: As directed    Advised to follow up Neurosurgery in 2 weeks. Advised to continue ceftriaxone, vancomycin and rifabutin for 6 weeks, Home health Nurse has been arranged , Wound care for wound vac arranged.   Discharge wound care:   Complete by: As directed    Wound care Nurse and follow up arranged.   Flush IV access with Sodium Chloride 0.9% and Heparin 10 units/ml or 100 units/ml   Complete by: As directed    Home infusion instructions - [facility]   Complete by: As directed  Instructions: Flush IV access with Sodium Chloride 0.9% and Heparin 10units/ml or 100units/ml   Change dressing on IV access line: Weekly and PRN   Instructions Cath Flo '2mg'$ : Administer for PICC Line occlusion and as ordered by physician for other access device   [facility] pharmacist to adjust dose for: Vancomycin, Aminoglycosides and other anti-infective therapies as requested by physician   Incentive  spirometry RT   Complete by: As directed    Increase activity slowly   Complete by: As directed    Method of administration may be changed at the discretion of home infusion pharmacist based upon assessment of the patient and/or caregiver's ability to self-administer the medication ordered   Complete by: As directed      Allergies as of 08/05/2020      Reactions   Amlodipine Anaphylaxis, Swelling, Other (See Comments)   Tongue swelling    Naltrexone Hives   Penicillins Hives, Other (See Comments)   Tolerated ceftriaxone and cefepime 06/2017 Hives      Medication List    STOP taking these medications   doxycycline 100 MG capsule Commonly known as: VIBRAMYCIN   oxyCODONE 5 MG immediate release tablet Commonly known as: Oxy IR/ROXICODONE   sulfamethoxazole-trimethoprim 800-160 MG tablet Commonly known as: BACTRIM DS     TAKE these medications   atorvastatin 10 MG tablet Commonly known as: LIPITOR Take 5 mg by mouth daily.   azelastine 0.1 % nasal spray Commonly known as: ASTELIN Place 2 sprays into both nostrils 2 (two) times daily as needed for rhinitis (congestion).   baclofen 20 MG tablet Commonly known as: LIORESAL Take 20 mg by mouth 3 (three) times daily.   carvedilol 25 MG tablet Commonly known as: COREG Take 25 mg by mouth at bedtime.   cefTRIAXone  IVPB Commonly known as: ROCEPHIN Inject 2 g into the vein daily. Indication:  Post-op lumbar wound infection First Dose: Yes Last Day of Therapy:  09/13/20 Labs - Once weekly:  CBC/D and BMP, Labs - Every other week:  ESR and CRP Method of administration: IV Push Method of administration may be changed at the discretion of home infusion pharmacist based upon assessment of the patient and/or caregiver's ability to self-administer the medication ordered.   cetirizine 10 MG tablet Commonly known as: ZYRTEC Take 10 mg by mouth daily.   ferrous sulfate 325 (65 FE) MG tablet Take 325 mg by mouth daily with  breakfast.   Fluticasone-Salmeterol 250-50 MCG/DOSE Aepb Commonly known as: ADVAIR Inhale 1 puff into the lungs 2 (two) times daily as needed (shortness of breath/wheezing).   furosemide 40 MG tablet Commonly known as: LASIX Take 40 mg by mouth daily.   hydrOXYzine 25 MG tablet Commonly known as: ATARAX/VISTARIL Take 12.5 mg by mouth at bedtime.   insulin aspart protamine- aspart (70-30) 100 UNIT/ML injection Commonly known as: NOVOLOG MIX 70/30 Inject 65 Units into the skin 2 (two) times daily with a meal.   ipratropium-albuterol 0.5-2.5 (3) MG/3ML Soln Commonly known as: DUONEB Take 3 mLs by nebulization every 6 (six) hours as needed (shortness of breath/wheezing/asthma).   lactulose 10 GM/15ML solution Commonly known as: CHRONULAC Take 10 g by mouth 3 (three) times daily as needed (constipation).   lamoTRIgine 150 MG tablet Commonly known as: LAMICTAL Take 150 mg by mouth See admin instructions. Take one tablet (150 mg) by mouth every morning with a 25 mg tablet for a total dose of 175 mg daily   lamoTRIgine 25 MG tablet Commonly known  as: LAMICTAL Take 25 mg by mouth See admin instructions. Take one tablet (25 mg) by mouth every morning with a 150 mg tablet for a total dose of 175 mg daily   magnesium oxide 400 MG tablet Commonly known as: MAG-OX Take 400 mg by mouth daily.   methocarbamol 750 MG tablet Commonly known as: Robaxin-750 Take 1 tablet (750 mg total) by mouth 3 (three) times daily as needed for muscle spasms.   metolazone 5 MG tablet Commonly known as: ZAROXOLYN Take 5 mg by mouth every Monday, Wednesday, and Friday.   OXYGEN Inhale 3 L into the lungs at bedtime. Use with BIPAP   Ozempic (1 MG/DOSE) 2 MG/1.5ML Sopn Generic drug: Semaglutide (1 MG/DOSE) Inject 1 mg into the skin every "Sunday.   pantoprazole 40 MG tablet Commonly known as: PROTONIX Take 40 mg by mouth daily.   potassium chloride SA 20 MEQ tablet Commonly known as: KLOR-CON Take  20 mEq by mouth daily.   pramipexole 0.5 MG tablet Commonly known as: MIRAPEX Take 1 mg by mouth at bedtime.   pregabalin 200 MG capsule Commonly known as: LYRICA Take 200 mg by mouth in the morning, at noon, and at bedtime.   rifabutin 150 MG capsule Commonly known as: MYCOBUTIN Take 2 capsules (300 mg total) by mouth daily.   silver sulfADIAZINE 1 % cream Commonly known as: SILVADENE Apply 1 application topically daily as needed (leg sores).   spironolactone 50 MG tablet Commonly known as: ALDACTONE Take 50 mg by mouth daily.   Synjardy XR 25-1000 MG Tb24 Generic drug: Empagliflozin-metFORMIN HCl ER Take 1 tablet by mouth daily.   traZODone 50 MG tablet Commonly known as: DESYREL Take 75 mg by mouth at bedtime.   vancomycin  IVPB Inject 1,000 mg into the vein every 12 (twelve) hours. Indication:  Post-op lumbar wound infection  First Dose: Yes Last Day of Therapy:  09/13/20 Labs - Sunday/Monday:  CBC/D, BMP, and vancomycin trough. Labs - Thursday:  BMP and vancomycin trough Labs - Every other week:  ESR and CRP Method of administration:Elastomeric Method of administration may be changed at the discretion of the patient and/or caregiver's ability to self-administer the medication ordered.   Vascepa 1 g capsule Generic drug: icosapent Ethyl Take 1 g by mouth 2 (two) times daily.   Vitamin D 50 MCG (2000 UT) tablet Take 2,000 Units by mouth daily.            Discharge Care Instructions  (From admission, onward)         Start     Ordered   08/05/20 0000  Change dressing on IV access line weekly and PRN  (Home infusion instructions - [facility] )        08"$ /13/21 1326   08/05/20 0000  Discharge wound care:       Comments: Wound care Nurse and follow up arranged.   08/05/20 1659          Follow-up Information    [facility] Follow up.   Why: HHRN/PT/OT- start of care for nursing to be Monday 8/16 for wound VAC needs- will resume PT/OT-         [facility] Follow up.   Why: IV infusion pharmacy referral for home IV abx needs. - bedside education completed by hospital liaison       [facility] Follow up.   Why: [facility] home wound VAC arranged- has been approved and released for delivery to the bedside on 8/13  Allergies  Allergen Reactions  . Amlodipine Anaphylaxis, Swelling and Other (See Comments)    Tongue swelling   . Naltrexone Hives  . Penicillins Hives and Other (See Comments)    Tolerated ceftriaxone and cefepime 06/2017 Hives      Consultations:  Neurosurgery, Wound care.   Procedures/Studies: DG Lumbar Spine 2-3 Views  Result Date: 07/07/2020 CLINICAL DATA:  Lower back pain after surgery EXAM: LUMBAR SPINE - 2-3 VIEW COMPARISON:  Fluoroscopy from 06/28/2020 FINDINGS: Transitional anatomy. There has been lower lumbar PLIF with prominent superior endplate subsidence of the cage when compared with prior. Anterolisthesis which was also noted on the fluoroscopic images. No visible pedicle screw displacement. IMPRESSION: Early, prominent superior endplate subsidence of the recently placed intervertebral cage, consider CT to evaluate for fracture. Electronically Signed   By: Monte Fantasia M.D.   On: 07/07/2020 10:16   MR LUMBAR SPINE WO CONTRAST  Result Date: 07/31/2020 CLINICAL DATA:  Lower back pain, recent surgery EXAM: MRI LUMBAR SPINE WITHOUT CONTRAST TECHNIQUE: Multiplanar, multisequence MR imaging of the lumbar spine was performed. No intravenous contrast was administered. COMPARISON:  Radiograph July 07, 2020 FINDINGS: Segmentation: There are 5 non-rib bearing lumbar type vertebral bodies with the last intervertebral disc space labeled as L5-S1. Alignment:  There is a minimal anterolisthesis of L4 on L5. Vertebrae: The patient is status post decompression at L4-L5 with posterior fixation with surrounding metallic artifact. There is an interbody fixation device seen at L4-L5 which is  not well evaluated on this exam. Mildly increased marrow edema seen within the L4 and L5 vertebral bodies. There also appears to be slight superior endplate compression deformity of the L5 vertebral body with less than 25% loss in height. Conus medullaris and cauda equina: Conus extends to the L1 level. Conus and cauda equina appear normal. Paraspinal and other soft tissues: Postsurgical changes are seen at L4-L5 with subcutaneous edema and fatty atrophy of the paraspinal musculature. There is overlying skin defect with a surgical tract, with possible small amount of fluid seen within the surgical tract. However no definite large loculated fluid collections are seen. Disc levels: T12-L1:  No significant canal or neural foraminal narrowing. L1-L2:   No significant canal or neural foraminal narrowing. L2-L3: Facet arthrosis is present which causes mild bilateral neural foraminal narrowing. L3-L4: There is a broad-based disc bulge and facet arthrosis which causes moderate bilateral neural foraminal narrowing. L4-L5: Limited evaluation due to artifact, however facet arthrosis is present. L5-S1: There is a broad-based disc bulge and facet arthrosis which causes moderate bilateral neural foraminal narrowing. IMPRESSION: 1. Status post decompression and fixation at L4-L5 with surrounding metallic artifact. There does appear to be marrow edema within the L4 and L5 vertebral body. 2. mild superior endplate compression of the L5 vertebral body with the interbody fixation device not well evaluated. If further evaluation is required would recommend CT. 3. Overlying postsurgical changes at L4-L5 with a surgical tract with a small amount of fluid within the tract. However no loculated fluid collections are noted. Electronically Signed   By: Prudencio Pair M.D.   On: 07/31/2020 19:30   DG CHEST PORT 1 VIEW  Result Date: 07/31/2020 CLINICAL DATA:  Shortness of breath EXAM: PORTABLE CHEST 1 VIEW COMPARISON:  None. FINDINGS: Heart  and mediastinal contours are within normal limits. No focal opacities or effusions. No acute bony abnormality. IMPRESSION: No active disease. Electronically Signed   By: Rolm Baptise M.D.   On: 07/31/2020 17:43   [place] EKG SITE RITE  Result Date: 08/02/2020 If Site Rite image not attached, placement could not be confirmed due to current cardiac rhythm.   Washout and wound VAC placement   Subjective: Patient was seen and examined at bedside.  No overnight events.  She reports pain is better controlled.  She is excited to be discharged home today.  Home health services, nursing and wound care arranged.  Patient will get IV antibiotics for next 6 weeks at home.  Discharge Exam: Vitals:   08/05/20 0749 08/05/20 1128  BP: 119/60 124/60  Pulse: 70 90  Resp: 19 18  Temp: 97.6 F (36.4 C) 97.6 F (36.4 C)  SpO2: 97% 97%   Vitals:   08/05/20 0346 08/05/20 0527 08/05/20 0749 08/05/20 1128  BP: 123/70  119/60 124/60  Pulse: 73 73 70 90  Resp: '18 17 19 18  '$ Temp: 97.6 F (36.4 C)  97.6 F (36.4 C) 97.6 F (36.4 C)  TempSrc: Oral  Oral Oral  SpO2: 97% 95% 97% 97%  Weight:  87.7 kg      General: Pt is alert, awake, not in acute distress Cardiovascular: RRR, S1/S2 +, no rubs, no gallops Respiratory: CTA bilaterally, no wheezing, no rhonchi Abdominal: Soft, NT, ND, bowel sounds + Extremities: no edema, no cyanosis    The results of significant diagnostics from this hospitalization (including imaging, microbiology, ancillary and laboratory) are listed below for reference.     Microbiology: Recent Results (from the past 240 hour(s))  Blood culture (routine x 2)     Status: None   Collection Time: 07/31/20 11:57 AM   Specimen: BLOOD RIGHT HAND  Result Value Ref Range Status   Specimen Description BLOOD RIGHT HAND  Final   Special Requests   Final    BOTTLES DRAWN AEROBIC AND ANAEROBIC Blood Culture adequate volume   Culture   Final    NO GROWTH 5 DAYS Performed at [facility], [address], [place], [place] [address]    Report Status 08/05/2020 FINAL  Final  Blood culture (routine x 2)     Status: None   Collection Time: 07/31/20  2:18 PM   Specimen: BLOOD  Result Value Ref Range Status   Specimen Description BLOOD RIGHT ANTECUBITAL  Final   Special Requests   Final    BOTTLES DRAWN AEROBIC AND ANAEROBIC Blood Culture results may not be optimal due to an inadequate volume of blood received in culture bottles   Culture   Final    NO GROWTH 5 DAYS Performed at [facility], [place] [address], [place], [place] [address]    Report Status 08/05/2020 FINAL  Final  SARS Coronavirus 2 by RT PCR (hospital order, performed in [facility] hospital lab) Nasopharyngeal Nasopharyngeal Swab     Status: None   Collection Time: 07/31/20  4:09 PM   Specimen: Nasopharyngeal Swab  Result Value Ref Range Status   SARS Coronavirus 2 NEGATIVE NEGATIVE Final    Comment: (NOTE) SARS-CoV-2 target nucleic acids are NOT DETECTED.  The SARS-CoV-2 RNA is generally detectable in upper and lower respiratory specimens during the acute phase of infection. The lowest concentration of SARS-CoV-2 viral copies this assay can detect is 250 copies / mL. A negative result does not preclude SARS-CoV-2 infection and should not be used as the sole basis for treatment or other patient management decisions.  A negative result may occur with improper specimen collection / handling, submission of specimen other than nasopharyngeal swab, presence of viral mutation(s) within the  areas targeted by this assay, and inadequate number of viral copies (<250 copies / mL). A negative result must be combined with clinical observations, patient history, and epidemiological information.  Fact Sheet for Patients:   StrictlyIdeas.no  Fact Sheet for Healthcare Providers: BankingDealers.co.za  This test is not yet approved or  cleared by the [place] FDA and has been authorized for detection and/or diagnosis of SARS-CoV-2 by FDA under an Emergency Use Authorization (EUA).  This EUA will remain in effect (meaning this test can be used) for the duration of the COVID-19 declaration under Section 564(b)(1) of the Act, 21 U.S.C. section 360bbb-3(b)(1), unless the authorization is terminated or revoked sooner.  Performed at [facility], [place] [address], [place], [place] [address]   MRSA PCR Screening     Status: Abnormal   Collection Time: 08/01/20  7:16 AM   Specimen: Nasal Mucosa; Nasopharyngeal  Result Value Ref Range Status   MRSA by PCR POSITIVE (A) NEGATIVE Final    Comment:        The GeneXpert MRSA Assay (FDA approved for NASAL specimens only), is one component of a comprehensive MRSA colonization surveillance program. It is not intended to diagnose MRSA infection nor to guide or monitor treatment for MRSA infections. RESULT CALLED TO, READ BACK BY AND VERIFIED WITH: A. Lavway RN 10:50 08/01/20 ([email or web address]) Performed at [facility], [place] [address], [place], [place] [address]   Fungus Culture With Stain     Status: None (Preliminary result)   Collection Time: 08/02/20 12:01 PM   Specimen: Soft Tissue, Other  Result Value Ref Range Status   Fungus Stain Final report  Final    Comment: (NOTE) Performed At: [facility] [place], [place] [id number] Rush Farmer MD WG:[id number]    Fungus (Mycology) Culture PENDING  Incomplete   Fungal Source WOUND  Final    Comment: LUMBAR Performed at [facility], [place] [address] [place], [place] [address]   Aerobic/Anaerobic Culture (surgical/deep wound)     Status: None (Preliminary result)   Collection Time: 08/02/20 12:01 PM   Specimen: Soft Tissue, Other  Result Value Ref Range Status   Specimen Description WOUND LUMBAR  Final   Special Requests NONE  Final   Gram Stain NO WBC SEEN NO ORGANISMS SEEN   Final   Culture   Final     NO GROWTH 3 DAYS NO ANAEROBES ISOLATED; CULTURE IN PROGRESS FOR 5 DAYS Performed at [facility], [address], [place],  [address]    Report Status PENDING  Incomplete  Fungus Culture Result     Status: None   Collection Time: 08/02/20 12:01 PM  Result Value Ref Range Status   Result 1 Comment  Final    Comment: (NOTE) KOH/Calcofluor preparation:  no fungus observed. Performed At: [facility] [place], [place] [id number] Rush Farmer MD UU:[id number]      Labs: BNP (last 3 results) No results for input(s): BNP in the last 8760 hours. Basic Metabolic Panel: Recent Labs  Lab 08/01/20 0542 08/02/20 0413 08/03/20 1600 08/04/20 0415 08/05/20 0611  NA 137 137 135 137 138  K 3.4* 3.7 3.5 3.5 3.3*  CL 96* 98 98 101 101  CO2 '28 29 28 27 27  '$ GLUCOSE 108* 130* 168* 125* 150*  BUN '14 12 10 9 8  '$ CREATININE 0.82 0.76 0.68 0.66 0.71  CALCIUM 8.9 9.2 9.2 9.1 9.2  MG  --  1.6* 1.8 1.5* 1.7  PHOS  --  3.3 3.1 3.6  --    Liver Function Tests: Recent Labs  Lab 07/31/20 1153 08/02/20 0413  AST 15 9*  ALT 13 12  ALKPHOS 131* 98  BILITOT 0.5 0.8  PROT 7.5 6.3*  ALBUMIN 3.3* 2.7*   No results for input(s): LIPASE, AMYLASE in the last 168 hours. No results for input(s): AMMONIA in the last 168 hours. CBC: Recent Labs  Lab 07/31/20 1153 08/01/20 0542 08/02/20 0413 08/05/20 0611  WBC 19.0* 11.2* 8.5 6.4  NEUTROABS 14.4*  --   --   --   HGB 13.3 11.3* 12.0 11.3*  HCT 43.0 35.5* 38.0 36.8  MCV 94.1 92.4 91.6 92.5  PLT 256 187 232 272   Cardiac Enzymes: No results for input(s): CKTOTAL, CKMB, CKMBINDEX, TROPONINI in the last 168 hours. BNP: Invalid input(s): POCBNP CBG: Recent Labs  Lab 08/04/20 1725 08/04/20 2132 08/05/20 0751 08/05/20 1126 08/05/20 1640  GLUCAP 171* 141* 164* 255* 115*   D-Dimer No results for input(s): DDIMER in the last 72 hours. Hgb A1c No results for input(s): HGBA1C in the last 72 hours. Lipid  Profile No results for input(s): CHOL, HDL, LDLCALC, TRIG, CHOLHDL, LDLDIRECT in the last 72 hours. Thyroid function studies No results for input(s): TSH, T4TOTAL, T3FREE, THYROIDAB in the last 72 hours.  Invalid input(s): FREET3 Anemia work up No results for input(s): VITAMINB12, FOLATE, FERRITIN, TIBC, IRON, RETICCTPCT in the last 72 hours. Urinalysis    Component Value Date/Time   COLORURINE YELLOW 07/04/2020 1919   APPEARANCEUR CLEAR 07/04/2020 1919   LABSPEC 1.016 07/04/2020 [place] 7.0 07/04/2020 1919   GLUCOSEU >=500 (A) 07/04/2020 1919   HGBUR NEGATIVE 07/04/2020 [place] NEGATIVE 07/04/2020 [place] NEGATIVE 07/04/2020 1919   PROTEINUR NEGATIVE 07/04/2020 1919   NITRITE NEGATIVE 07/04/2020 1919   LEUKOCYTESUR NEGATIVE 07/04/2020 1919   Sepsis Labs Invalid input(s): PROCALCITONIN,  WBC,  LACTICIDVEN Microbiology Recent Results (from the past 240 hour(s))  Blood culture (routine x 2)     Status: None   Collection Time: 07/31/20 11:57 AM   Specimen: BLOOD RIGHT HAND  Result Value Ref Range Status   Specimen Description BLOOD RIGHT HAND  Final   Special Requests   Final    BOTTLES DRAWN AEROBIC AND ANAEROBIC Blood Culture adequate volume   Culture   Final    NO GROWTH 5 DAYS Performed at [facility], [address], [place], [place] [address]    Report Status 08/05/2020 FINAL  Final  Blood culture (routine x 2)     Status: None   Collection Time: 07/31/20  2:18 PM   Specimen: BLOOD  Result Value Ref Range Status   Specimen Description BLOOD RIGHT ANTECUBITAL  Final   Special Requests   Final    BOTTLES DRAWN AEROBIC AND ANAEROBIC Blood Culture results may not be optimal due to an inadequate volume of blood received in culture bottles   Culture   Final    NO GROWTH 5 DAYS Performed at [facility], [place] [address] [place], [place] [address]    Report Status 08/05/2020 FINAL  Final  SARS Coronavirus 2 by RT PCR (hospital  order, performed in [facility] hospital lab) Nasopharyngeal Nasopharyngeal Swab     Status: None   Collection Time: 07/31/20  4:09 PM   Specimen: Nasopharyngeal Swab  Result Value Ref Range Status   SARS Coronavirus 2 NEGATIVE NEGATIVE Final  Comment: (NOTE) SARS-CoV-2 target nucleic acids are NOT DETECTED.  The SARS-CoV-2 RNA is generally detectable in upper and lower respiratory specimens during the acute phase of infection. The lowest concentration of SARS-CoV-2 viral copies this assay can detect is 250 copies / mL. A negative result does not preclude SARS-CoV-2 infection and should not be used as the sole basis for treatment or other patient management decisions.  A negative result may occur with improper specimen collection / handling, submission of specimen other than nasopharyngeal swab, presence of viral mutation(s) within the areas targeted by this assay, and inadequate number of viral copies (<250 copies / mL). A negative result must be combined with clinical observations, patient history, and epidemiological information.  Fact Sheet for Patients:   StrictlyIdeas.no  Fact Sheet for Healthcare Providers: BankingDealers.co.za  This test is not yet approved or  cleared by the [place] FDA and has been authorized for detection and/or diagnosis of SARS-CoV-2 by FDA under an Emergency Use Authorization (EUA).  This EUA will remain in effect (meaning this test can be used) for the duration of the COVID-19 declaration under Section 564(b)(1) of the Act, 21 U.S.C. section 360bbb-3(b)(1), unless the authorization is terminated or revoked sooner.  Performed at [facility], [place] [address] [place], [place] [address]   MRSA PCR Screening     Status: Abnormal   Collection Time: 08/01/20  7:16 AM   Specimen: Nasal Mucosa; Nasopharyngeal  Result Value Ref Range Status   MRSA by PCR POSITIVE (A) NEGATIVE Final    Comment:         The GeneXpert MRSA Assay (FDA approved for NASAL specimens only), is one component of a comprehensive MRSA colonization surveillance program. It is not intended to diagnose MRSA infection nor to guide or monitor treatment for MRSA infections. RESULT CALLED TO, READ BACK BY AND VERIFIED WITH: A. Lavway RN 10:50 08/01/20 ([email or web address]) Performed at [facility], [place] [address], [place], [place] [address]   Fungus Culture With Stain     Status: None (Preliminary result)   Collection Time: 08/02/20 12:01 PM   Specimen: Soft Tissue, Other  Result Value Ref Range Status   Fungus Stain Final report  Final    Comment: (NOTE) Performed At: [facility] [place], [place] [id number] Rush Farmer MD ER:[id number]    Fungus (Mycology) Culture PENDING  Incomplete   Fungal Source WOUND  Final    Comment: LUMBAR Performed at [facility], [place] [address] [place], [place] [address]   Aerobic/Anaerobic Culture (surgical/deep wound)     Status: None (Preliminary result)   Collection Time: 08/02/20 12:01 PM   Specimen: Soft Tissue, Other  Result Value Ref Range Status   Specimen Description WOUND LUMBAR  Final   Special Requests NONE  Final   Gram Stain NO WBC SEEN NO ORGANISMS SEEN   Final   Culture   Final    NO GROWTH 3 DAYS NO ANAEROBES ISOLATED; CULTURE IN PROGRESS FOR 5 DAYS Performed at [facility], [address], [place], [place] [address]    Report Status PENDING  Incomplete  Fungus Culture Result     Status: None   Collection Time: 08/02/20 12:01 PM  Result Value Ref Range Status   Result 1 Comment  Final    Comment: (NOTE) KOH/Calcofluor preparation:  no fungus observed. Performed At: [facility] [place], [place] [id number] Rush Farmer MD AS:[id number]      Time  coordinating discharge: Over 30 minutes  SIGNED:   Shawna Clamp, MD  [facility] 08/05/2020, 4:59 PM Pager   If 7PM-7AM,  please contact night-coverage [email or web address]

## 2020-08-05 NOTE — Consult Note (Addendum)
WOC Nurse Consult Note: Reason for Consult: Consult requested for Vac dressing change; pt is followed by the neurosurgical team. Wound type: Full thickness post-op wound to middle back Measurement: 5X2X3cm Wound bed: beefy red with small amt bloody drainage Periwound: intact skin surrounding Dressing procedure/placement/frequency: Pt was medicated for pain prior to the procedure and tolerated with mod amt discomfort.  Removed 2 pieces black sponge. Applied one piece black foam and bridged the track pad to the flank at cont suction to reduce pressure.  Plan for dressing changes Q M/W/F. Cammie Mcgee MSN, RN, CWOCN, [place], CNS [phone]

## 2020-08-05 NOTE — Discharge Instructions (Signed)
Advised to follow up Neurosurgery in 2 weeks. Advised to continue ceftriaxone, vancoimycin and rifabutin for 6 weeks, Home health Nurse has been arranged , Wound care for wound vac arranged.

## 2020-08-05 NOTE — Progress Notes (Signed)
PROGRESS NOTE    Brenda Morgan  UUV:[id number] DOB: 03-26-1956 DOA: 07/31/2020   PCP: Patient, No Pcp Per   Brief Narrative:  Brenda Morgan is a 64 y.o. female with medical history significant of COPD, essential hypertension, diabetes mellitus type 2 without complication, fatty liver, and sleep apnea presents with complaints of worsening back pain.  Patient had underwent L4-L5 decompression and fusion on 7/6 by Dr. Conchita Paris.[name]  She was readmitted for postoperative back pain with acute kidney injury from 7/14-7/20 treated with steroids and PT/OT.  Seen at urgent care on 7/31 for worsening pain after reporting drainage from the wound site.  She was given Rocephin and started on doxycycline at that time.  She was seen by Dr. Nundkumar 3 days ago and the wound appeared to be getting better.  He started her on a 10-day course of Bactrim, but wanted to get a MRI lumbar spine for further evaluation.  However, patient was noted to be more lethargic with worsening back pain over the last day. Purulent drainage and redness was noted around the surgical site.  She complains of decreased sensation on the left leg and was globally weak. Patient denies any fever or recent falls. Dr. Theola Sequin neurosurgery was formally consulted.    Patient is admitted for sepsis secondary to postoperative wound infection.  Patient is started on IV antibiotics.  Patient underwent washout and wound VAC placement on 8/10, tolerated well. Infectious disease consulted,  Patient has received PICC line needs vancomycin and ceftriaxone for 6 weeks.  Neurosurgery signed off,  there is no further plan for Intervention.  Assessment & Plan:   Principal Problem:   Postoperative wound infection Active Problems:   Postoperative back pain   COPD (chronic obstructive pulmonary disease) (HCC)   DM II (diabetes mellitus, type II), controlled (HCC)   HTN (hypertension)   Infection of lumbar spine (HCC)   Hardware complicating wound infection  (HCC)   Sepsis secondary to postoperative wound infection: Acute.   Patient was noted to be tachycardic and tachypneic with WBC elevated at 19 and lactic acid initially 2.6.   Lumbar spine noted to be likely source of infection with purulent drainage discharge reported.   Patient reporting decrease in sensation on the right lower extremity. - Admitted to telemetry - Sepsis protocol initiated. - Blood cultures : No growth so far - MRI of the lumbar spine : Marrow edema within L4-L5 vertebral body - Continue empiric antibiotics of vancomycin - UA , CXR : unremarkable. - 2 L of lactated Ringer's given - Neurosurgery consulted .  S/P Washout and placement of wound VAC on Tuesday. - Infectious disease consulted,  recommended vancomycin, ceftriaxone and rifabutin for 6-week. - Patient has received PICC line.  ID outpatient follow-up arranged. -Neurosurgery recommended no further surgical intervention at this point. -Wound VAC, IV antibiotics arranged.   Diabetes mellitus type 2:  Last hemoglobin A1c 6.7 on 06/24/2020.   Blood glucose appears to be relatively well controlled.   Home medications include NovoLog 70/30 mix 65 units 2 times daily, Smaglutide 1 mg every Sunday, and Empagliflozin-Metformin 25-1000 mg daily. -Hypoglycemic protocol -CBGs before every meal with sensitive SSI  -Reduced 70/30 mix to 30 units twice daily  Essential hypertension: controlled. - Blood pressures currently relatively well controlled. -Continue Coreg -Will need to restart rest of patient's home regimen as tolerated when medically appropriate.  Chronic kidney disease stage IIIa:  >> Resolved. Creatinine mildly elevated at 1.17 with BUN 24 to suggest patient dehydrated.   Baseline  creatinine appears to be around 1. -Recheck kidney function in a.m.  Hyperlipidemia -Continue atorvastatin.    DVT prophylaxis: Lovenox Code Status: Full Family Communication: Husband updated at bedside. Disposition  Plan:  Home with home PT, nursing and aide. Consults called: Neurosurgery Admission status: Inpatient. Patient is not medically clear, requiring wound care and IV antibiotic. Patient has a PICC line needs IV antibiotic for 6 weeks,  outpatient antibiotic arrangement has been arranged.   Patient cleared from neurosurgery, wound VAC and home health services has to be arranged.   Consultants:    Neurosurgery  Procedures: Antimicrobials:  Anti-infectives (From admission, onward)   Start     Dose/Rate Route Frequency Ordered Stop   08/05/20 0000  vancomycin IVPB     Discontinue     1,000 mg Intravenous Every 12 hours 08/05/20 1326 09/14/20 2359   08/05/20 0000  cefTRIAXone (ROCEPHIN) IVPB     Discontinue     2 g Intravenous Every 24 hours 08/05/20 1326 09/14/20 2359   08/04/20 1200  cefTRIAXone (ROCEPHIN) 2 g in sodium chloride 0.9 % 100 mL IVPB     Discontinue     2 g 200 mL/hr over 30 Minutes Intravenous Every 24 hours 08/04/20 0953     08/03/20 1400  rifabutin (MYCOBUTIN) capsule 300 mg     Discontinue     300 mg Oral Daily 08/03/20 0946     08/02/20 1216  bacitracin 50,000 Units in sodium chloride 0.9 % 500 mL irrigation  Status:  Discontinued          As needed 08/02/20 1216 08/02/20 1251   08/02/20 0830  sodium chloride 0.9 % 1,000 mL with bacitracin 100,000 Units infusion        10 mL/hr  Irrigation To Surgery 08/02/20 0829 08/03/20 0830   08/02/20 0700  vancomycin (VANCOCIN) IVPB 1000 mg/200 mL premix  Status:  Discontinued        1,000 mg 200 mL/hr over 60 Minutes Intravenous On call to O.R. 08/02/20 0602 08/02/20 1326   08/02/20 0000  rifabutin (MYCOBUTIN) 150 MG capsule     Discontinue     300 mg Oral Daily 08/02/20 1604     08/01/20 1600  vancomycin (VANCOCIN) IVPB 1000 mg/200 mL premix     Discontinue     1,000 mg 200 mL/hr over 60 Minutes Intravenous Every 12 hours 08/01/20 1417     08/01/20 0400  vancomycin (VANCOREADY) IVPB 750 mg/150 mL  Status:  Discontinued         750 mg 150 mL/hr over 60 Minutes Intravenous Every 12 hours 07/31/20 1559 08/01/20 1417   07/31/20 1600  vancomycin (VANCOREADY) IVPB 500 mg/100 mL        500 mg 100 mL/hr over 60 Minutes Intravenous  Once 07/31/20 1559 07/31/20 1833   07/31/20 1400  vancomycin (VANCOCIN) IVPB 1000 mg/200 mL premix        1,000 mg 200 mL/hr over 60 Minutes Intravenous  Once 07/31/20 1357 07/31/20 1543      Subjective: Patient was seen and examined at bedside.  No overnight events.  Patient reports feeling better, she reports pain is better controlled now. Wound VAC and home health services has to be arranged. Objective: Vitals:   08/05/20 0346 08/05/20 0527 08/05/20 0749 08/05/20 1128  BP: 123/70  119/60 124/60  Pulse: 73 73 70 90  Resp: 18 17 19 18   Temp: 97.6 F (36.4 C)  97.6 F (36.4 C) 97.6 F (36.4 C)  TempSrc: Oral  Oral Oral  SpO2: 97% 95% 97% 97%  Weight:  87.7 kg      Intake/Output Summary (Last 24 hours) at 08/05/2020 1410 Last data filed at 08/05/2020 0615 Gross per 24 hour  Intake 500 ml  Output 150 ml  Net 350 ml   Filed Weights   08/04/20 0654 08/05/20 0527  Weight: 84.4 kg 87.7 kg    Examination:  General exam: Appears calm and comfortable. Respiratory system: Clear to auscultation. Respiratory effort normal. Cardiovascular system: S1 & S2 heard, RRR. No JVD, murmurs, rubs, gallops or clicks. No pedal edema. Gastrointestinal system: Abdomen is nondistended, soft and nontender. No organomegaly or masses felt. Normal bowel sounds heard. Central nervous system: Alert and oriented. No focal neurological deficits. Extremities: No leg swelling, no cyanosis, no edema. Skin: No rashes, lesions or ulcers Psychiatry: Judgement and insight appear normal. Mood & affect appropriate.     Data Reviewed: I have personally reviewed following labs and imaging studies  CBC: Recent Labs  Lab 07/31/20 1153 08/01/20 0542 08/02/20 0413 08/05/20 0611  WBC 19.0* 11.2* 8.5 6.4    NEUTROABS 14.4*  --   --   --   HGB 13.3 11.3* 12.0 11.3*  HCT 43.0 35.5* 38.0 36.8  MCV 94.1 92.4 91.6 92.5  PLT 256 187 232 272   Basic Metabolic Panel: Recent Labs  Lab 08/01/20 0542 08/02/20 0413 08/03/20 1600 08/04/20 0415 08/05/20 0611  NA 137 137 135 137 138  K 3.4* 3.7 3.5 3.5 3.3*  CL 96* 98 98 101 101  CO2 28 29 28 27 27   GLUCOSE 108* 130* 168* 125* 150*  BUN 14 12 10 9 8   CREATININE 0.82 0.76 0.68 0.66 0.71  CALCIUM 8.9 9.2 9.2 9.1 9.2  MG  --  1.6* 1.8 1.5* 1.7  PHOS  --  3.3 3.1 3.6  --    GFR: Estimated Creatinine Clearance: 81.4 mL/min (by C-G formula based on SCr of 0.71 mg/dL). Liver Function Tests: Recent Labs  Lab 07/31/20 1153 08/02/20 0413  AST 15 9*  ALT 13 12  ALKPHOS 131* 98  BILITOT 0.5 0.8  PROT 7.5 6.3*  ALBUMIN 3.3* 2.7*   No results for input(s): LIPASE, AMYLASE in the last 168 hours. No results for input(s): AMMONIA in the last 168 hours. Coagulation Profile: Recent Labs  Lab 08/02/20 1022  INR 1.1   Cardiac Enzymes: No results for input(s): CKTOTAL, CKMB, CKMBINDEX, TROPONINI in the last 168 hours. BNP (last 3 results) No results for input(s): PROBNP in the last 8760 hours. HbA1C: No results for input(s): HGBA1C in the last 72 hours. CBG: Recent Labs  Lab 08/04/20 1119 08/04/20 1725 08/04/20 2132 08/05/20 0751 08/05/20 1126  GLUCAP 142* 171* 141* 164* 255*   Lipid Profile: No results for input(s): CHOL, HDL, LDLCALC, TRIG, CHOLHDL, LDLDIRECT in the last 72 hours. Thyroid Function Tests: No results for input(s): TSH, T4TOTAL, FREET4, T3FREE, THYROIDAB in the last 72 hours. Anemia Panel: No results for input(s): VITAMINB12, FOLATE, FERRITIN, TIBC, IRON, RETICCTPCT in the last 72 hours. Sepsis Labs: Recent Labs  Lab 07/31/20 1153 07/31/20 1558 07/31/20 2113  LATICACIDVEN 2.6* 1.5 1.4    Recent Results (from the past 240 hour(s))  Blood culture (routine x 2)     Status: None   Collection Time: 07/31/20 11:57  AM   Specimen: BLOOD RIGHT HAND  Result Value Ref Range Status   Specimen Description BLOOD RIGHT HAND  Final   Special Requests   Final  BOTTLES DRAWN AEROBIC AND ANAEROBIC Blood Culture adequate volume   Culture   Final    NO GROWTH 5 DAYS Performed at [facility], [address] [place], [place] [address]    Report Status 08/05/2020 FINAL  Final  Blood culture (routine x 2)     Status: None   Collection Time: 07/31/20  2:18 PM   Specimen: BLOOD  Result Value Ref Range Status   Specimen Description BLOOD RIGHT ANTECUBITAL  Final   Special Requests   Final    BOTTLES DRAWN AEROBIC AND ANAEROBIC Blood Culture results may not be optimal due to an inadequate volume of blood received in culture bottles   Culture   Final    NO GROWTH 5 DAYS Performed at [facility], [address], [place], [place] [address]    Report Status 08/05/2020 FINAL  Final  SARS Coronavirus 2 by RT PCR (hospital order, performed in [facility] hospital lab) Nasopharyngeal Nasopharyngeal Swab     Status: None   Collection Time: 07/31/20  4:09 PM   Specimen: Nasopharyngeal Swab  Result Value Ref Range Status   SARS Coronavirus 2 NEGATIVE NEGATIVE Final    Comment: (NOTE) SARS-CoV-2 target nucleic acids are NOT DETECTED.  The SARS-CoV-2 RNA is generally detectable in upper and lower respiratory specimens during the acute phase of infection. The lowest concentration of SARS-CoV-2 viral copies this assay can detect is 250 copies / mL. A negative result does not preclude SARS-CoV-2 infection and should not be used as the sole basis for treatment or other patient management decisions.  A negative result may occur with improper specimen collection / handling, submission of specimen other than nasopharyngeal swab, presence of viral mutation(s) within the areas targeted by this assay, and inadequate number of viral copies (<250 copies / mL). A negative result must be combined with  clinical observations, patient history, and epidemiological information.  Fact Sheet for Patients:   BoilerBrush.com.cy  Fact Sheet for Healthcare Providers: https://pope.com/  This test is not yet approved or  cleared by the [place] FDA and has been authorized for detection and/or diagnosis of SARS-CoV-2 by FDA under an Emergency Use Authorization (EUA).  This EUA will remain in effect (meaning this test can be used) for the duration of the COVID-19 declaration under Section 564(b)(1) of the Act, 21 U.S.C. section 360bbb-3(b)(1), unless the authorization is terminated or revoked sooner.  Performed at [facility], [address], [place], [place] [address]   MRSA PCR Screening     Status: Abnormal   Collection Time: 08/01/20  7:16 AM   Specimen: Nasal Mucosa; Nasopharyngeal  Result Value Ref Range Status   MRSA by PCR POSITIVE (A) NEGATIVE Final    Comment:        The GeneXpert MRSA Assay (FDA approved for NASAL specimens only), is one component of a comprehensive MRSA colonization surveillance program. It is not intended to diagnose MRSA infection nor to guide or monitor treatment for MRSA infections. RESULT CALLED TO, READ BACK BY AND VERIFIED WITH: A. Lavway RN 10:50 08/01/20 ([email or web address]) Performed at [facility], [address], [place], [place] [address]   Fungus Culture With Stain     Status: None (Preliminary result)   Collection Time: 08/02/20 12:01 PM   Specimen: Soft Tissue, Other  Result Value Ref Range Status   Fungus Stain Final report  Final    Comment: (NOTE) Performed At: [facility] [address] [place], [place]  [id number] Jolene Schimke MD WU:[id number]    Fungus (Mycology) Culture PENDING  Incomplete   Fungal Source WOUND  Final    Comment: LUMBAR Performed at [facility], [address], [place], [place] [address]   Aerobic/Anaerobic Culture (surgical/deep wound)      Status: None (Preliminary result)   Collection Time: 08/02/20 12:01 PM   Specimen: Soft Tissue, Other  Result Value Ref Range Status   Specimen Description WOUND LUMBAR  Final   Special Requests NONE  Final   Gram Stain NO WBC SEEN NO ORGANISMS SEEN   Final   Culture   Final    NO GROWTH 3 DAYS NO ANAEROBES ISOLATED; CULTURE IN PROGRESS FOR 5 DAYS Performed at [facility], [address] [place], [place] [address]    Report Status PENDING  Incomplete  Fungus Culture Result     Status: None   Collection Time: 08/02/20 12:01 PM  Result Value Ref Range Status   Result 1 Comment  Final    Comment: (NOTE) KOH/Calcofluor preparation:  no fungus observed. Performed At: [facility] [address] [place], [place] [id number] Jolene Schimke MD MW:[id number]      Radiology Studies: No results found.  Scheduled Meds: . Chlorhexidine Gluconate Cloth  6 each Topical Q0600  . feeding supplement (ENSURE ENLIVE)  237 mL Oral BID BM  . heparin injection (subcutaneous)  5,000 Units Subcutaneous Q12H  . insulin aspart  0-15 Units Subcutaneous TID WC  . insulin aspart  0-5 Units Subcutaneous QHS  . mupirocin ointment  1 application Nasal BID  . pramipexole  1 mg Oral QHS  . rifabutin  300 mg Oral Daily  . sodium chloride flush  10-40 mL Intracatheter Q12H  . sodium chloride flush  3 mL Intravenous Once  . sodium chloride flush  3 mL Intravenous Q12H  . sodium chloride flush  3 mL Intravenous Q12H  . traZODone  75 mg Oral QHS   Continuous Infusions: . sodium chloride    . cefTRIAXone (ROCEPHIN)  IV 2 g (08/05/20 1214)  . lactated ringers 10 mL/hr at 08/02/20 0844  . methocarbamol (ROBAXIN) IV 500 mg (08/05/20 0421)  . vancomycin 1,000 mg (08/05/20 0615)     LOS: 5 days    Time spent: 25 mins.   Cipriano Bunker, MD [facility]   If 7PM-7AM, please contact night-coverage

## 2020-08-06 LAB — ACID FAST SMEAR (AFB, MYCOBACTERIA): Acid Fast Smear: NEGATIVE

## 2020-08-07 ENCOUNTER — Emergency Department (HOSPITAL_COMMUNITY)
Admission: EM | Admit: 2020-08-07 | Discharge: 2020-08-07 | Disposition: A | Discharge status: Home / Self Care | Payer: BC Managed Care – PPO | Attending: Emergency Medicine | Admitting: Emergency Medicine

## 2020-08-07 ENCOUNTER — Other Ambulatory Visit: Payer: Self-pay

## 2020-08-07 ENCOUNTER — Encounter (HOSPITAL_COMMUNITY): Payer: Self-pay | Admitting: Emergency Medicine

## 2020-08-07 DIAGNOSIS — Z7984 Long term (current) use of oral hypoglycemic drugs: Secondary | ICD-10-CM | POA: Diagnosis not present

## 2020-08-07 DIAGNOSIS — Z5189 Encounter for other specified aftercare: Secondary | ICD-10-CM

## 2020-08-07 DIAGNOSIS — J441 Chronic obstructive pulmonary disease with (acute) exacerbation: Secondary | ICD-10-CM | POA: Insufficient documentation

## 2020-08-07 DIAGNOSIS — E119 Type 2 diabetes mellitus without complications: Secondary | ICD-10-CM | POA: Diagnosis not present

## 2020-08-07 DIAGNOSIS — I10 Essential (primary) hypertension: Secondary | ICD-10-CM | POA: Diagnosis not present

## 2020-08-07 DIAGNOSIS — Z79899 Other long term (current) drug therapy: Secondary | ICD-10-CM | POA: Diagnosis not present

## 2020-08-07 DIAGNOSIS — Z4689 Encounter for fitting and adjustment of other specified devices: Secondary | ICD-10-CM

## 2020-08-07 DIAGNOSIS — Z4801 Encounter for change or removal of surgical wound dressing: Secondary | ICD-10-CM | POA: Insufficient documentation

## 2020-08-07 DIAGNOSIS — F17210 Nicotine dependence, cigarettes, uncomplicated: Secondary | ICD-10-CM | POA: Insufficient documentation

## 2020-08-07 DIAGNOSIS — Z7689 Persons encountering health services in other specified circumstances: Secondary | ICD-10-CM

## 2020-08-07 LAB — AEROBIC/ANAEROBIC CULTURE W GRAM STAIN (SURGICAL/DEEP WOUND)
Culture: NO GROWTH
Gram Stain: NONE SEEN

## 2020-08-07 MED ORDER — OXYCODONE-ACETAMINOPHEN 5-325 MG PO TABS
1.0000 | ORAL_TABLET | Freq: Once | ORAL | Status: DC
  Filled 2020-08-07: qty 1

## 2020-08-07 MED ORDER — OXYCODONE-ACETAMINOPHEN 5-325 MG PO TABS
1.0000 | ORAL_TABLET | ORAL | Status: AC | PRN
  Administered 2020-08-07 (×2): 1 via ORAL
  Filled 2020-08-07: qty 1

## 2020-08-07 NOTE — ED Notes (Signed)
RN and provider assessed wound, which appears to be working well.  Pt's wound vac is working properly at this point.  Plan is to have home health RN adjust machine in the AM.

## 2020-08-07 NOTE — ED Provider Notes (Signed)
[facility] EMERGENCY DEPARTMENT Provider Note   CSN: [id number] Arrival date & time: 08/07/20  1330     History Chief Complaint  Patient presents with  . Wound Check    Brenda Morgan is a 64 y.o. female.  64 year old female with past medical history below including COPD, type 2 diabetes mellitus, recent lumbar spine surgery complicated by infection who presents with wound VAC problem.  Patient was admitted to the hospital for surgical wound infection which was debrided and a wound VAC was placed.  She was discharged 2 days ago and is set up with home health and home antibiotics.  Husband reports that overnight she was having severe pain and difficulty sleeping, feels like the skin around her wound VAC is tight and pulling as though the vacuum is too strong.  Today, the device showed an error message this morning.  The message has since disappeared and it appears to be running.  No fevers or other new symptoms since discharge from the hospital.  Has been receiving antibiotics on schedule as prescribed.  Home health nurse is scheduled to visit in the morning.  The history is provided by the patient and the spouse.  Wound Check       Past Medical History:  Diagnosis Date  . COPD (chronic obstructive pulmonary disease) ([place])   . Diabetes mellitus without complication ([place])   . Fatty liver 2012   per pt 06/24/20  . Sleep apnea     Patient Active Problem List   Diagnosis Date Noted  . Infection of lumbar spine ([place]) 08/02/2020  . Hardware complicating wound infection ([place]) 08/02/2020  . COPD (chronic obstructive pulmonary disease) ([place]) 08/01/2020  . DM II (diabetes mellitus, type II), controlled ([place]) 08/01/2020  . HTN (hypertension) 08/01/2020  . Postoperative wound infection 07/31/2020  . Postoperative back pain 07/06/2020  . Lumbar radiculopathy 07/06/2020  . Lumbar spinal stenosis 06/28/2020    Past Surgical History:  Procedure Laterality Date  .  BACK SURGERY    . carotid artery surgery Right 10/07/2012  . carotid ultrasound  06/2015  . CHOLECYSTECTOMY  1981  . COLONOSCOPY  2014  . DIAGNOSTIC MAMMOGRAM  03/2017  . groin lypnoid Left 03/2015  . LUMBAR WOUND DEBRIDEMENT N/A 08/02/2020   Procedure: IRRIGATE AND DEBRIDEMENT OF LUMBAR WOUND, PLACEMENT OF WOUND VAC;  Surgeon: Dawley, Theodoro Doing, DO;  Location: [facility];  Service: Neurosurgery;  Laterality: N/A;  . right heel spur  1994  . TUBAL LIGATION  1985  . wrist trigger release Right 01/2016     OB History   No obstetric history on file.     No family history on file.  Social History   Tobacco Use  . Smoking status: Current Every Day Smoker    Packs/day: 0.50    Types: Cigarettes  . Smokeless tobacco: Never Used  Substance Use Topics  . Alcohol use: Not Currently  . Drug use: Not Currently    Home Medications Prior to Admission medications   Medication Sig Start Date End Date Taking? Authorizing Provider  atorvastatin (LIPITOR) 10 MG tablet Take 5 mg by mouth daily.    [provider]  azelastine (ASTELIN) 0.1 % nasal spray Place 2 sprays into both nostrils 2 (two) times daily as needed for rhinitis (congestion).  07/30/20   [provider]  baclofen (LIORESAL) 20 MG tablet Take 20 mg by mouth 3 (three) times daily.    [provider]  carvedilol (COREG) 25 MG tablet Take  25 mg by mouth at bedtime.     [provider]  cefTRIAXone (ROCEPHIN) IVPB Inject 2 g into the vein daily. Indication:  Post-op lumbar wound infection First Dose: Yes Last Day of Therapy:  09/13/20 Labs - Once weekly:  CBC/D and BMP, Labs - Every other week:  ESR and CRP Method of administration: IV Push Method of administration may be changed at the discretion of home infusion pharmacist based upon assessment of the patient and/or caregiver's ability to self-administer the medication ordered. 08/05/20 09/14/20  Shawna Clamp, MD  cetirizine (ZYRTEC) 10 MG tablet Take  10 mg by mouth daily.    [provider]  Cholecalciferol (VITAMIN D) 50 MCG (2000 UT) tablet Take 2,000 Units by mouth daily.    [provider]  Empagliflozin-metFORMIN HCl ER (SYNJARDY XR) 25-1000 MG TB24 Take 1 tablet by mouth daily.    [provider]  ferrous sulfate 325 (65 FE) MG tablet Take 325 mg by mouth daily with breakfast.    [provider]  Fluticasone-Salmeterol (ADVAIR) 250-50 MCG/DOSE AEPB Inhale 1 puff into the lungs 2 (two) times daily as needed (shortness of breath/wheezing).     [provider]  furosemide (LASIX) 40 MG tablet Take 40 mg by mouth daily. 07/25/20   [provider]  hydrOXYzine (ATARAX/VISTARIL) 25 MG tablet Take 12.5 mg by mouth at bedtime. 07/30/20   [provider]  icosapent Ethyl (VASCEPA) 1 g capsule Take 1 g by mouth 2 (two) times daily.    [provider]  insulin aspart protamine- aspart (NOVOLOG MIX 70/30) (70-30) 100 UNIT/ML injection Inject 65 Units into the skin 2 (two) times daily with a meal.     [provider]  ipratropium-albuterol (DUONEB) 0.5-2.5 (3) MG/3ML SOLN Take 3 mLs by nebulization every 6 (six) hours as needed (shortness of breath/wheezing/asthma).     [provider]  lactulose (CHRONULAC) 10 GM/15ML solution Take 10 g by mouth 3 (three) times daily as needed (constipation).     [provider]  lamoTRIgine (LAMICTAL) 150 MG tablet Take 150 mg by mouth See admin instructions. Take one tablet (150 mg) by mouth every morning with a 25 mg tablet for a total dose of 175 mg daily    [provider]  lamoTRIgine (LAMICTAL) 25 MG tablet Take 25 mg by mouth See admin instructions. Take one tablet (25 mg) by mouth every morning with a 150 mg tablet for a total dose of 175 mg daily 07/21/20   [provider]  magnesium oxide (MAG-OX) 400 MG tablet Take 400 mg by mouth daily.    [provider]  methocarbamol (ROBAXIN-750) 750  MG tablet Take 1 tablet (750 mg total) by mouth 3 (three) times daily as needed for muscle spasms. 06/30/20   Costella, Vista Mink, PA-C  metolazone (ZAROXOLYN) 5 MG tablet Take 5 mg by mouth every Monday, Wednesday, and Friday.     [provider]  OXYGEN Inhale 3 L into the lungs at bedtime. Use with BIPAP    [provider]  pantoprazole (PROTONIX) 40 MG tablet Take 40 mg by mouth daily.    [provider]  potassium chloride SA (KLOR-CON) 20 MEQ tablet Take 20 mEq by mouth daily.    [provider]  pramipexole (MIRAPEX) 0.5 MG tablet Take 1 mg by mouth at bedtime.    [provider]  pregabalin (LYRICA) 200 MG capsule Take 200 mg by mouth in the morning, at noon, and at  bedtime.    [provider]  rifabutin (MYCOBUTIN) 150 MG capsule Take 2 capsules (300 mg total) by mouth daily. 08/02/20   Shawna Clamp, MD  Semaglutide, 1 MG/DOSE, (OZEMPIC, 1 MG/DOSE,) 2 MG/1.5ML SOPN Inject 1 mg into the skin every Sunday.    [provider]  silver sulfADIAZINE (SILVADENE) 1 % cream Apply 1 application topically daily as needed (leg sores).     [provider]  spironolactone (ALDACTONE) 50 MG tablet Take 50 mg by mouth daily.    [provider]  traZODone (DESYREL) 50 MG tablet Take 75 mg by mouth at bedtime. 07/31/20   [provider]  vancomycin IVPB Inject 1,000 mg into the vein every 12 (twelve) hours. Indication:  Post-op lumbar wound infection  First Dose: Yes Last Day of Therapy:  09/13/20 Labs - Sunday/Monday:  CBC/D, BMP, and vancomycin trough. Labs - Thursday:  BMP and vancomycin trough Labs - Every other week:  ESR and CRP Method of administration:Elastomeric Method of administration may be changed at the discretion of the patient and/or caregiver's ability to self-administer the medication ordered. 08/05/20 09/14/20  Shawna Clamp, MD    Allergies    Amlodipine, Naltrexone, and Penicillins  Review of  Systems   Review of Systems All other systems reviewed and are negative except that which was mentioned in HPI  Physical Exam Updated Vital Signs BP 134/62 (BP Location: Left Arm)   Pulse 71   Temp 98.2 F (36.8 C) (Oral)   Resp 16   SpO2 100%   Physical Exam Vitals and nursing note reviewed.  Constitutional:      General: She is not in acute distress.    Appearance: She is well-developed.  HENT:     Head: Normocephalic and atraumatic.  Eyes:     Conjunctiva/sclera: Conjunctivae normal.  Cardiovascular:     Rate and Rhythm: Normal rate and regular rhythm.     Heart sounds: Normal heart sounds. No murmur heard.   Pulmonary:     Effort: Pulmonary effort is normal.     Breath sounds: Normal breath sounds.  Musculoskeletal:     Cervical back: Neck supple.     Right lower leg: No edema.     Left lower leg: No edema.  Skin:    General: Skin is warm and dry.     Comments: Wound vac in place on lower lumbar back with appropriate seal, no leakage/drainage; central wound without necrosis; wound vac line is intact  Neurological:     Mental Status: She is alert and oriented to person, place, and time.     Comments: Fluent speech  Psychiatric:        Mood and Affect: Mood normal.     ED Results / Procedures / Treatments   Labs (all labs ordered are listed, but only abnormal results are displayed) Labs Reviewed - No data to display  EKG None  Radiology No results found.  Procedures Procedures (including critical care time)  Medications Ordered in ED Medications  oxyCODONE-acetaminophen (PERCOCET/ROXICET) 5-325 MG per tablet 1 tablet (has no administration in time range)  oxyCODONE-acetaminophen (PERCOCET/ROXICET) 5-325 MG per tablet 1 tablet (1 tablet Oral Given 08/07/20 1942)    ED Course  I have reviewed the triage vital signs and the nursing notes.     MDM Rules/Calculators/A&P                          Wound vac appears to be functioning  normally with good  seal, no leakage. Wound appears appropriately healing. I explained it is possible the suction power could be turned down but a wound care nurse is not available at this time of day. Because they have home health coming in the morning, I recommended that they have home health nurse follow-up about any potential adjustment of the wound VAC.  Otherwise she seems to be progressing appropriately at home and no problems with receiving antibiotics.  Discussed supportive measures with husband. Final Clinical Impression(s) / ED Diagnoses Final diagnoses:  Encounter for wound re-check  Encounter for management of wound VAC    Rx / DC Orders ED Discharge Orders    None       Ramadan Couey, Wenda Overland, MD 08/07/20 2005

## 2020-08-07 NOTE — ED Notes (Signed)
Updated regarding wait for treatment room.

## 2020-08-07 NOTE — ED Triage Notes (Signed)
Patient was discharged from hospital recently with wound-vac on right flank, states she is here because the wound is very painful and her wound-vac device showed an error message this morning. Patient is unable to remember what the error message was, no error message currently displayed on wound-vac device. Patient alert and oriented.

## 2020-08-07 NOTE — ED Notes (Signed)
Delay for treatment room explained to pt.

## 2020-08-10 NOTE — Progress Notes (Signed)
8/18- post discharge note- received call from Husband on 8/16 that [facility] would not be able to make nursing visit that day due to staffing emergency- call made to [facility] and re-faxed orders to them from discharge for [facility] needs- they stated that they were going to see if they could find nursing to staff on Tues. Also had [facility] orders to [facility]. Unfortunately on Tues- husband called again to state that [facility] called and told him that they could no longer staff patient's HH needs and they were releasing them from service. Husband attempting to find another agency in area that can take referral. Call made to [place] [facility] and await return call. On pt's previous admit- most HH agencies were OON with her [employer] plan.  Today- 8/18- continuing to try to find a [facility] agency with the assistance of both Pam with [facility] and Stephanie with [facility]- per Judeth Cornfield [facility] does not seem to be open for business- doors up there are locked- and they are not answering phone.  Have spoken with [facility] which is in network but they can not accept referral at this time. Have also reached out to all other [facility] agencies in the area that patient lives without success to secure an agency- have requested Pam to fax OPAT orders to pt's PCP- and husband is trying to see if PCP office can assist with needed services for today as we continue to problem solve issues for [facility].  Also received call from Pam [facility] with Dr. Val Riles office regarding situation.  1600- husband called again with name of agency that he spoke with that might can assist- [facility]- call made to that agency and msg left for intake RN to return call- Burna Mortimer.  Also reached out to local [facility] hospital liaison to see if she could assist- per Elnita Maxwell with [facility] in [place] has agreed to accept referral and can staff for needed services- [facility] [place] contactBurna [phone]  (fax-[phone])- will have OPAT orders faxed to them by Pam with [facility]- and Elnita Maxwell to pull orders from [facility] for d/c [facility] needs. Call made to Dr. Conchita Paris office and msg left for [facility] with update on [facility] arrangements. Husband also updated that [facility] had accepted referral.

## 2020-08-11 DIAGNOSIS — Z452 Encounter for adjustment and management of vascular access device: Secondary | ICD-10-CM | POA: Insufficient documentation

## 2020-08-11 DIAGNOSIS — I129 Hypertensive chronic kidney disease with stage 1 through stage 4 chronic kidney disease, or unspecified chronic kidney disease: Secondary | ICD-10-CM | POA: Insufficient documentation

## 2020-08-11 DIAGNOSIS — Z9981 Dependence on supplemental oxygen: Secondary | ICD-10-CM | POA: Insufficient documentation

## 2020-08-11 DIAGNOSIS — T8144XA Sepsis following a procedure, initial encounter: Secondary | ICD-10-CM | POA: Insufficient documentation

## 2020-08-11 DIAGNOSIS — Z79899 Other long term (current) drug therapy: Secondary | ICD-10-CM | POA: Insufficient documentation

## 2020-08-11 DIAGNOSIS — N1831 Chronic kidney disease, stage 3a: Secondary | ICD-10-CM | POA: Insufficient documentation

## 2020-08-11 DIAGNOSIS — T8142XA Infection following a procedure, deep incisional surgical site, initial encounter: Secondary | ICD-10-CM | POA: Insufficient documentation

## 2020-08-17 ENCOUNTER — Telehealth: Payer: Self-pay

## 2020-08-17 NOTE — Telephone Encounter (Signed)
Cece can you call Brenda Morgan to make sure she is able to take in fluids?  I see she was having nausea in the hospital and I want to encourage her to increase her water intake to flush her kidneys better.   I worry the rifabutin is making her nauseated.

## 2020-08-17 NOTE — Telephone Encounter (Signed)
Larita Fife, Pharmacist called office today with lab results for patient. States that patient's Vanc trough last week was 28.4 and creatine: 1.14. [facility] had patient hold off vanc and continue Rocephin.  States this weeks labs from 8/23 are Creatine: 1.38 and Vanc Trough: 11.1. Will fax results to office.  Will forward results to provider. Patient still holding off on vanc. Lorenso Courier, [place]

## 2020-08-18 NOTE — Telephone Encounter (Signed)
Spoke with patient this morning per Durwin Nora, NP. Advised she increase her water intake to help flush kidneys. Patient still having episodes of nausea will call office if this continues. Lorenso Courier, [place]

## 2020-08-19 NOTE — Telephone Encounter (Signed)
Do we have any vanc levels to help make that decision? Hopefully that is being checked today.

## 2020-08-19 NOTE — Telephone Encounter (Signed)
Larita Fife called back, asking for direction on when to restart vancomycin due to elevation in creatinine. 8/13 = 0.71 8/19 = 1.14 (first set of labs from home health) 8/23 = 1.38  Labs are being drawn today (will ask for these to be processed STAT). Patient still on rocephin, has been held since 8/20. Please advise on when to restart vancomycin, if at all. Andree Coss, RN

## 2020-08-19 NOTE — Telephone Encounter (Signed)
It is, per [employer]. Hoping to have these back this afternoon.

## 2020-08-22 ENCOUNTER — Telehealth: Payer: Self-pay

## 2020-08-22 NOTE — Telephone Encounter (Signed)
Per Larita Fife with [facility] states he spoke with Dr. Drue Second on 08/19/20 and received orders to start Daptomycin,  D/C vancomycin and rocephin. Larita Fife wanted to clarify end date of Dapto of 09/13/20. Routing to Dr. Drue Second for advise. Valarie Cones

## 2020-08-22 NOTE — Telephone Encounter (Signed)
Received verbal order from Dr. Drue Second to continue Cefriaxone, add Daptomycin. Discontinue Vanc and Rifabutin.  Larita Fife with [facility] accepted verbal order and verbalized understanding of orders.  Valarie Cones

## 2020-08-25 ENCOUNTER — Other Ambulatory Visit: Payer: Self-pay

## 2020-08-25 ENCOUNTER — Telehealth (INDEPENDENT_AMBULATORY_CARE_PROVIDER_SITE_OTHER): Payer: BC Managed Care – PPO | Admitting: Infectious Diseases

## 2020-08-25 DIAGNOSIS — N179 Acute kidney failure, unspecified: Secondary | ICD-10-CM

## 2020-08-25 DIAGNOSIS — T847XXD Infection and inflammatory reaction due to other internal orthopedic prosthetic devices, implants and grafts, subsequent encounter: Secondary | ICD-10-CM | POA: Diagnosis not present

## 2020-08-25 DIAGNOSIS — M4626 Osteomyelitis of vertebra, lumbar region: Secondary | ICD-10-CM

## 2020-08-25 NOTE — Progress Notes (Signed)
Patient: Brenda Morgan  DOB: 1956/03/24 MRN: [id number] PCP: Medicine, [facility]   VIRTUAL CARE ENCOUNTER  I connected with [name] on 09/30/20 at 10:15 AM EDT by VIDEO and verified that I am speaking with the correct person using two identifiers.   I discussed the limitations, risks, security and privacy concerns of performing an evaluation and management service by virtual services and the availability of in person appointments. I also discussed with the patient that there may be a patient responsible charge related to this service. The patient expressed understanding and agreed to proceed.  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 follow-up vertebral infection complicated by hardware. Intraoperative results were negative for any culture growth in the setting of prior antibiotic exposure.  Still has the wound vac in place with changes every Monday Wednesday and Friday. There is little to no drainage out now which is great. She has an appointment with her surgeon team in a few more weeks.   We had to switch her from vancomycin to daptomycin due to AKI.  She continues to take her Rifabutin once a day and it is not causing any nausea. She is taking POs well without any concern. Her back pain  has improved overall. No fevers or chills. No trouble breathing PICC line.   Review of Systems  Constitutional: Negative for chills and fever.  HENT: Negative for tinnitus.   Eyes: Negative for blurred vision and photophobia.  Respiratory: Negative for cough and sputum production.   Cardiovascular: Negative for chest pain.  Gastrointestinal: Negative for diarrhea, nausea and vomiting.  Genitourinary: Negative for dysuria.  Skin: Negative for rash.  Neurological: Negative for headaches.    Past Medical History:  Diagnosis Date   COPD (chronic obstructive pulmonary disease) ([place])    Diabetes mellitus without complication ([place])    Fatty liver 2012   per pt 06/24/20   Sleep apnea     Outpatient Medications Prior to Visit  Medication Sig Dispense Refill   atorvastatin (LIPITOR) 10 MG tablet Take 5 mg by mouth daily.     azelastine (ASTELIN) 0.1 % nasal spray Place 2 sprays into both nostrils 2 (two) times daily as needed for rhinitis (congestion).      baclofen (LIORESAL) 20 MG tablet Take 20 mg by mouth 3 (three) times daily.     carvedilol (COREG) 25 MG tablet Take 25 mg by mouth at bedtime.      cefTRIAXone (ROCEPHIN) IVPB Inject 2 g into the vein daily. Indication:  Post-op lumbar wound infection First Dose: Yes Last Day of Therapy:  09/13/20 Labs - Once weekly:  CBC/D and BMP, Labs - Every other week:  ESR and CRP Method of administration: IV Push Method of administration may be changed at the discretion of home infusion pharmacist based upon assessment of the  caregiver's ability to self-administer the medication ordered. 40 Units 0   cetirizine (ZYRTEC) 10 MG tablet Take 10 mg by mouth daily.     Cholecalciferol (VITAMIN D) 50 MCG (2000 UT) tablet Take 2,000 Units by mouth daily.     Empagliflozin-metFORMIN HCl ER (SYNJARDY XR) 25-1000 MG TB24 Take 1 tablet by mouth daily.     ferrous sulfate 325 (65 FE) MG tablet Take 325 mg by mouth daily with  breakfast.     Fluticasone-Salmeterol (ADVAIR) 250-50 MCG/DOSE AEPB Inhale 1 puff into the lungs 2 (two) times daily as needed (shortness of breath/wheezing).      hydrOXYzine (ATARAX/VISTARIL) 25 MG tablet Take 12.5 mg by mouth at bedtime.     icosapent Ethyl (VASCEPA) 1 g capsule Take 1 g by mouth 2 (two) times daily.     insulin aspart protamine- aspart (NOVOLOG MIX 70/30) (70-30) 100 UNIT/ML injection Inject 65 Units into the skin 2 (two) times daily with a meal.      lamoTRIgine (LAMICTAL) 150 MG tablet Take 150 mg by mouth See admin instructions. Take one tablet (150 mg) by mouth every morning with a 25 mg tablet for a total dose of 175 mg daily     lamoTRIgine (LAMICTAL) 25 MG tablet Take 25 mg by mouth See admin instructions. Take one tablet (25 mg) by mouth every morning with a 150 mg tablet for a total dose of 175 mg daily     magnesium oxide (MAG-OX) 400 MG tablet Take 400 mg by mouth daily.     methocarbamol (ROBAXIN-750) 750 MG tablet Take 1 tablet (750 mg total) by mouth 3 (three) times daily as needed for muscle spasms. (Patient not taking: Reported on 09/23/2020) 90 tablet 1   metolazone (ZAROXOLYN) 5 MG tablet Take 5 mg by mouth every Monday, Wednesday, and Friday.      OXYGEN Inhale 3 L into the lungs at bedtime. Use with BIPAP     pantoprazole (PROTONIX) 40 MG tablet Take 40 mg by mouth daily.     potassium chloride SA (KLOR-CON) 20 MEQ tablet Take 20 mEq by mouth daily.     pramipexole (MIRAPEX) 0.5 MG tablet Take 1 mg by mouth at bedtime.     pregabalin (LYRICA) 200 MG capsule Take 200 mg by mouth in the morning, at noon, and at bedtime.     Semaglutide, 1 MG/DOSE, (OZEMPIC, 1 MG/DOSE,) 2 MG/1.5ML SOPN Inject 1 mg into the skin every Sunday.     silver sulfADIAZINE (SILVADENE) 1 % cream Apply 1 application topically daily as needed (leg sores).      spironolactone (ALDACTONE) 50 MG tablet Take 50 mg by mouth daily.     torsemide (DEMADEX) 20 MG tablet Take 20  mg by mouth daily.      furosemide (LASIX) 40 MG tablet Take 40 mg by mouth daily.      ipratropium-albuterol (DUONEB) 0.5-2.5 (3) MG/3ML SOLN Take 3 mLs by nebulization every 6 (six) hours as needed (shortness of breath/wheezing/asthma).      lactulose (CHRONULAC) 10 GM/15ML solution Take 10 g by mouth 3 (three) times daily as needed (constipation).      rifabutin (MYCOBUTIN) 150 MG capsule Take 2 capsules (300 mg total) by mouth daily. 60 capsule 0   traZODone (DESYREL) 100 MG tablet Take 100 mg by mouth at bedtime.      vancomycin IVPB Inject 1,000 mg into the vein every 12 (twelve) hours. Indication:  Post-op lumbar wound infection  First Dose: Yes Last  Yes Last Day of Therapy:  09/13/20 Labs - Sunday/Monday:  CBC/D, BMP, and vancomycin trough. Labs - Thursday:  BMP and vancomycin trough Labs - Every other week:  ESR and CRP Method of administration:Elastomeric Method of administration may be changed at the discretion of the patient and/or caregiver's ability to self-administer the medication ordered. (Patient not taking: Reported on 08/25/2020) 80 Units 0   No facility-administered medications prior to visit.     Allergies  Allergen Reactions   Amlodipine Anaphylaxis, Swelling and Other (See Comments)    Tongue swelling    Naltrexone Hives   Penicillins Hives and Other (See Comments)    Tolerated ceftriaxone and cefepime 06/2017 Hives      Social History   Tobacco Use   Smoking status: Current Every Day Smoker    Packs/day: 1.00    Types: Cigarettes   Smokeless tobacco: Never Used  Substance Use Topics   Alcohol use: Not Currently   Drug use: Not Currently    No family history on file.  Objective:  There were no vitals filed for this visit. There is no height or weight on file to calculate BMI.  Physical Exam Constitutional:      Appearance: Normal appearance. She is not ill-appearing.  HENT:     Mouth/Throat:     Mouth: Mucous membranes are moist.      Pharynx: Oropharynx is clear.  Eyes:     General: No scleral icterus. Pulmonary:     Effort: Pulmonary effort is normal.  Neurological:     Mental Status: She is oriented to person, place, and time.  Psychiatric:        Mood and Affect: Mood normal.        Thought Content: Thought content normal.     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 Would suspect staph species or other skin flora. Will need suppressive regimen going forward after induction phase. We will have her return again at the end of therapy to make decisions regarding the next steps.      Hardware complicating wound infection ([place])    We will continue rifabutin at least the first 6 weeks. She has had some nausea with it previously but seems to be doing better but that overall now.        Unprioritized   AKI (acute kidney injury) ([place])    Improved since switching from vancomycin. Continue to push fluids and avoid nephrotoxic agents including NSAIDs to help with recovery.          Janene Madeira, MSN, NP-C [facility] [place] Pager: [phone] Office: [phone]  09/30/20  10:57 AM

## 2020-08-26 ENCOUNTER — Telehealth: Payer: Self-pay

## 2020-08-26 NOTE — Telephone Encounter (Signed)
Per Judeth Cornfield called Debbie at [facility] and relayed verbal order 2x weekly BMP for another week then weekly until end of antibiotics. Order was repeated and verified. Crissie Figures

## 2020-08-31 LAB — FUNGAL ORGANISM REFLEX

## 2020-08-31 LAB — FUNGUS CULTURE WITH STAIN

## 2020-08-31 LAB — FUNGUS CULTURE RESULT

## 2020-09-14 ENCOUNTER — Telehealth: Payer: Self-pay | Admitting: *Deleted

## 2020-09-14 NOTE — Telephone Encounter (Signed)
Larita Fife at [facility] calling for pull PICC orders. Please advise. Andree Coss, RN

## 2020-09-14 NOTE — Telephone Encounter (Signed)
OK to pull PICC line   I have a visit with her tomorrow but can you please help me get a message to her that I am going to call in an antibiotic for her to start taking by mouth.   Doxycycline - one capsule twice a day with food and a full glass of water. Careful with the sun as it increases risk for burns. Do not take with milk.   Can you please send in # 60 with 5 refills to pharmacy of her preference please?   We will talk further about the duration and reason for this at our visit tomorrow.

## 2020-09-15 ENCOUNTER — Telehealth (INDEPENDENT_AMBULATORY_CARE_PROVIDER_SITE_OTHER): Payer: BC Managed Care – PPO | Admitting: Infectious Diseases

## 2020-09-15 ENCOUNTER — Encounter: Payer: Self-pay | Admitting: Infectious Diseases

## 2020-09-15 ENCOUNTER — Other Ambulatory Visit: Payer: Self-pay

## 2020-09-15 DIAGNOSIS — N179 Acute kidney failure, unspecified: Secondary | ICD-10-CM

## 2020-09-15 DIAGNOSIS — M4626 Osteomyelitis of vertebra, lumbar region: Secondary | ICD-10-CM

## 2020-09-15 DIAGNOSIS — T847XXD Infection and inflammatory reaction due to other internal orthopedic prosthetic devices, implants and grafts, subsequent encounter: Secondary | ICD-10-CM

## 2020-09-15 HISTORY — DX: Acute kidney failure, unspecified: N17.9

## 2020-09-15 MED ORDER — DOXYCYCLINE HYCLATE 100 MG PO TABS
100.0000 mg | ORAL_TABLET | Freq: Two times a day (BID) | ORAL | 1 refills | Status: DC
Start: 2020-09-15 — End: 2020-11-07

## 2020-09-15 NOTE — Assessment & Plan Note (Signed)
She is doing very well and her wound has closed up completely. Her inflammatory markers have improved as well on treatment.  Will have home health D/C PICC line.  Will continue on suppressive doxycycline for now to cover staphylococcal species and have her follow up back here in the office in 4 weeks to repeat exam and blood work.

## 2020-09-15 NOTE — Assessment & Plan Note (Signed)
Mild r/t vancomycin in the setting of high trough. Normalized after discontinuing medication.

## 2020-09-15 NOTE — Assessment & Plan Note (Signed)
I asked her to follow up with her surgeon to ensure he has no reservations with regards to her showering.

## 2020-09-15 NOTE — Progress Notes (Signed)
Patient: Brenda Morgan  DOB: [birth year] MRN: [id number] PCP: Medicine, [facility]    VIRTUAL CARE ENCOUNTER  I connected with Brenda Morgan on 09/15/20 at  9:15 AM EDT by TELEPHONE and verified that I am speaking with the correct person using two identifiers.   I discussed the limitations, risks, security and privacy concerns of performing an evaluation and management service by telephone and the availability of in person appointments. I also discussed with the patient that there may be a patient responsible charge related to this service. The patient expressed understanding and agreed to proceed.  Patient Location: [place] residence   Other Participants: none  Provider Location: [facility] Office   Patient Active Problem List   Diagnosis Date Noted  . 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 Hardware complicating wound infection ([facility]) 08/02/2020    Priority: High  . COPD (chronic obstructive pulmonary disease) ([facility]) 08/01/2020  . DM II (diabetes mellitus, type II), controlled ([facility]) 08/01/2020  . HTN (hypertension) 08/01/2020  . Postoperative wound infection 07/31/2020  . Postoperative back pain 07/06/2020  . Lumbar radiculopathy 07/06/2020  . Lumbar spinal stenosis 06/28/2020     Subjective:  Brenda Morgan is a 64 y.o. with history of culture negative vertebral infection complicated by hardware s/p fusion.   Brenda Morgan completed her IV antibiotics on 9/21 and had PICC removed yesterday. Brenda Morgan states her back pain has improved. Brenda Morgan had the wound vac removed and it is closed up now and no drainage.  Brenda Morgan tolerated antibiotics well without side effects. Brenda Morgan is hopeful Brenda Morgan can take a shower soon    Review of Systems  Constitutional: Negative for chills and fever.  HENT: Negative for tinnitus.   Eyes: Negative for blurred vision and photophobia.  Respiratory: Negative for cough and sputum production.   Cardiovascular: Negative for chest pain.    Gastrointestinal: Negative for diarrhea, nausea and vomiting.  Genitourinary: Negative for dysuria.  Skin: Negative for rash.  Neurological: Negative for headaches.    Past Medical History:  Diagnosis Date  . COPD (chronic obstructive pulmonary disease) (HCC)   . Diabetes mellitus without complication (HCC)   . Fatty liver 2012   per pt 06/24/20  . Sleep apnea     Outpatient Medications Prior to Visit  Medication Sig Dispense Refill  . atorvastatin (LIPITOR) 10 MG tablet Take 5 mg by mouth daily.    [name] azelastine (ASTELIN) 0.1 % nasal spray Place 2 sprays into both nostrils 2 (two) times daily as needed for rhinitis (congestion).     . baclofen (LIORESAL) 20 MG tablet Take 20 mg by mouth 3 (three) times daily.    . carvedilol (COREG) 25 MG tablet Take 25 mg by mouth at bedtime.     . cetirizine (ZYRTEC) 10 MG tablet Take 10 mg by mouth daily.    . Cholecalciferol (VITAMIN D) 50 MCG (2000 UT) tablet Take 2,000 Units by mouth daily.    . Empagliflozin-metFORMIN HCl ER (SYNJARDY XR) 25-1000 MG TB24 Take 1 tablet by mouth daily.    . ferrous sulfate 325 (65 FE) MG tablet Take 325 mg by mouth daily with breakfast.    . Fluticasone-Salmeterol (ADVAIR) 250-50 MCG/DOSE AEPB Inhale 1 puff into the lungs 2 (two) times daily as needed (shortness of breath/wheezing).     . furosemide (LASIX) 40 MG tablet Take 40 mg by mouth daily.     . hydrOXYzine (ATARAX/VISTARIL) 25 MG tablet Take 12.5 mg by mouth at  bedtime.    [name] icosapent Ethyl (VASCEPA) 1 g capsule Take 1 g by mouth 2 (two) times daily.    . insulin aspart protamine- aspart (NOVOLOG MIX 70/30) (70-30) 100 UNIT/ML injection Inject 65 Units into the skin 2 (two) times daily with a meal.     . ipratropium-albuterol (DUONEB) 0.5-2.5 (3) MG/3ML SOLN Take 3 mLs by nebulization every 6 (six) hours as needed (shortness of breath/wheezing/asthma).     . lactulose (CHRONULAC) 10 GM/15ML solution Take 10 g by mouth 3 (three) times daily as needed  (constipation).     [name] lamoTRIgine (LAMICTAL) 150 MG tablet Take 150 mg by mouth See admin instructions. Take one tablet (150 mg) by mouth every morning with a 25 mg tablet for a total dose of 175 mg daily    . lamoTRIgine (LAMICTAL) 25 MG tablet Take 25 mg by mouth See admin instructions. Take one tablet (25 mg) by mouth every morning with a 150 mg tablet for a total dose of 175 mg daily    . magnesium oxide (MAG-OX) 400 MG tablet Take 400 mg by mouth daily.    . methocarbamol (ROBAXIN-750) 750 MG tablet Take 1 tablet (750 mg total) by mouth 3 (three) times daily as needed for muscle spasms. 90 tablet 1  . metolazone (ZAROXOLYN) 5 MG tablet Take 5 mg by mouth every Monday, Wednesday, and Friday.     . OXYGEN Inhale 3 L into the lungs at bedtime. Use with BIPAP    . pantoprazole (PROTONIX) 40 MG tablet Take 40 mg by mouth daily.    . potassium chloride SA (KLOR-CON) 20 MEQ tablet Take 20 mEq by mouth daily.    . pramipexole (MIRAPEX) 0.5 MG tablet Take 1 mg by mouth at bedtime.    . pregabalin (LYRICA) 200 MG capsule Take 200 mg by mouth in the morning, at noon, and at bedtime.    . rifabutin (MYCOBUTIN) 150 MG capsule Take 2 capsules (300 mg total) by mouth daily. 60 capsule 0  . Semaglutide, 1 MG/DOSE, (OZEMPIC, 1 MG/DOSE,) 2 MG/1.5ML SOPN Inject 1 mg into the skin every Sunday.    . silver sulfADIAZINE (SILVADENE) 1 % cream Apply 1 application topically daily as needed (leg sores).     [name] spironolactone (ALDACTONE) 50 MG tablet Take 50 mg by mouth daily.    [name] torsemide (DEMADEX) 20 MG tablet     . traZODone (DESYREL) 50 MG tablet Take 75 mg by mouth at bedtime.     . famotidine (PEPCID) 40 MG tablet     . methylPREDNISolone (MEDROL DOSEPAK) 4 MG TBPK tablet     . oxyCODONE (OXY IR/ROXICODONE) 5 MG immediate release tablet Take 5 mg by mouth 4 (four) times daily as needed.    . pioglitazone (ACTOS) 15 MG tablet      No facility-administered medications prior to visit.     Allergies    Allergen Reactions  . Amlodipine Anaphylaxis, Swelling and Other (See Comments)    Tongue swelling   . Naltrexone Hives  . Penicillins Hives and Other (See Comments)    Tolerated ceftriaxone and cefepime 06/2017 Hives      Social History   Tobacco Use  . Smoking status: Current Every Day Smoker    Packs/day: 1.00    Types: Cigarettes  . Smokeless tobacco: Never Used  Substance Use Topics  . Alcohol use: Not Currently  . Drug use: Not Currently    No family history on file.  Objective:  There  were no vitals filed for this visit. There is no height or weight on file to calculate BMI.  Physical Exam Pulmonary:     Effort: Pulmonary effort is normal.     Comments: No shortness of breath detected in conversation.  Neurological:     Mental Status: Brenda Morgan is oriented to person, place, and time.  Psychiatric:        Mood and Affect: Mood normal.        Behavior: Behavior normal.        Thought Content: Thought content normal.        Judgment: Judgment normal.     Lab Results: Lab Results  Component Value Date   WBC 6.4 08/05/2020   HGB 11.3 (L) 08/05/2020   HCT 36.8 08/05/2020   MCV 92.5 08/05/2020   PLT 272 08/05/2020    Lab Results  Component Value Date   CREATININE 0.71 08/05/2020   BUN 8 08/05/2020   NA 138 08/05/2020   K 3.3 (L) 08/05/2020   CL 101 08/05/2020   CO2 27 08/05/2020    Lab Results  Component Value Date   ALT 12 08/02/2020   AST 9 (L) 08/02/2020   ALKPHOS 98 08/02/2020   BILITOT 0.8 08/02/2020     Assessment & Plan:   Problem List Items Addressed This Visit      High   Hardware complicating wound infection (HCC)    Brenda Morgan is doing very well and her wound has closed up completely. Her inflammatory markers have improved as well on treatment.  Will have home health D/C PICC line.  Will continue on suppressive doxycycline for now to cover staphylococcal species and have her follow up back here in the office in 4 weeks to repeat exam and blood  work.         Follow Up Instructions: As above    I discussed the assessment and treatment plan with the patient. The patient was provided an opportunity to ask questions and all were answered. The patient agreed with the plan and demonstrated an understanding of the instructions.   The patient was advised to call back or seek an in-person evaluation if the symptoms worsen or if the condition fails to improve as anticipated.  I provided 8 minutes of non-face-to-face time during this encounter.   Rexene Alberts, MSN, NP-C [facility]  [email or web address] Pager: [phone] Office: [phone] [facility] Main Line: [phone]     09/15/20  9:32 AM

## 2020-09-19 LAB — ACID FAST CULTURE WITH REFLEXED SENSITIVITIES (MYCOBACTERIA): Acid Fast Culture: NEGATIVE

## 2020-09-23 ENCOUNTER — Emergency Department (HOSPITAL_COMMUNITY): Payer: BC Managed Care – PPO

## 2020-09-23 ENCOUNTER — Encounter (HOSPITAL_COMMUNITY): Payer: Self-pay | Admitting: Emergency Medicine

## 2020-09-23 ENCOUNTER — Emergency Department (HOSPITAL_COMMUNITY)
Admission: EM | Admit: 2020-09-23 | Discharge: 2020-09-23 | Disposition: A | Discharge status: Home / Self Care | Payer: BC Managed Care – PPO | Attending: Emergency Medicine | Admitting: Emergency Medicine

## 2020-09-23 DIAGNOSIS — G319 Degenerative disease of nervous system, unspecified: Secondary | ICD-10-CM

## 2020-09-23 DIAGNOSIS — Z79899 Other long term (current) drug therapy: Secondary | ICD-10-CM | POA: Insufficient documentation

## 2020-09-23 DIAGNOSIS — Z794 Long term (current) use of insulin: Secondary | ICD-10-CM | POA: Diagnosis not present

## 2020-09-23 DIAGNOSIS — F17210 Nicotine dependence, cigarettes, uncomplicated: Secondary | ICD-10-CM | POA: Diagnosis not present

## 2020-09-23 DIAGNOSIS — J449 Chronic obstructive pulmonary disease, unspecified: Secondary | ICD-10-CM | POA: Insufficient documentation

## 2020-09-23 DIAGNOSIS — S32050A Wedge compression fracture of fifth lumbar vertebra, initial encounter for closed fracture: Secondary | ICD-10-CM | POA: Insufficient documentation

## 2020-09-23 DIAGNOSIS — S3992XA Unspecified injury of lower back, initial encounter: Secondary | ICD-10-CM | POA: Diagnosis present

## 2020-09-23 DIAGNOSIS — W19XXXA Unspecified fall, initial encounter: Secondary | ICD-10-CM | POA: Insufficient documentation

## 2020-09-23 DIAGNOSIS — E119 Type 2 diabetes mellitus without complications: Secondary | ICD-10-CM | POA: Diagnosis not present

## 2020-09-23 DIAGNOSIS — I10 Essential (primary) hypertension: Secondary | ICD-10-CM | POA: Diagnosis not present

## 2020-09-23 HISTORY — DX: Degenerative disease of nervous system, unspecified: G31.9

## 2020-09-23 LAB — CBC WITH DIFFERENTIAL/PLATELET
Abs Immature Granulocytes: 0.06 10*3/uL (ref 0.00–0.07)
Basophils Absolute: 0 10*3/uL (ref 0.0–0.1)
Basophils Relative: 0 %
Eosinophils Absolute: 0.2 10*3/uL (ref 0.0–0.5)
Eosinophils Relative: 2 %
HCT: 36.7 % (ref 36.0–46.0)
Hemoglobin: 11.8 g/dL — ABNORMAL LOW (ref 12.0–15.0)
Immature Granulocytes: 1 %
Lymphocytes Relative: 24 %
Lymphs Abs: 2.4 10*3/uL (ref 0.7–4.0)
MCH: 29.4 pg (ref 26.0–34.0)
MCHC: 32.2 g/dL (ref 30.0–36.0)
MCV: 91.5 fL (ref 80.0–100.0)
Monocytes Absolute: 0.8 10*3/uL (ref 0.1–1.0)
Monocytes Relative: 8 %
Neutro Abs: 6.5 10*3/uL (ref 1.7–7.7)
Neutrophils Relative %: 65 %
Platelets: 171 10*3/uL (ref 150–400)
RBC: 4.01 MIL/uL (ref 3.87–5.11)
RDW: 15.3 % (ref 11.5–15.5)
WBC: 10 10*3/uL (ref 4.0–10.5)
nRBC: 0 % (ref 0.0–0.2)

## 2020-09-23 LAB — COMPREHENSIVE METABOLIC PANEL
ALT: 16 U/L (ref 0–44)
AST: 16 U/L (ref 15–41)
Albumin: 3.1 g/dL — ABNORMAL LOW (ref 3.5–5.0)
Alkaline Phosphatase: 98 U/L (ref 38–126)
Anion gap: 9 (ref 5–15)
BUN: 17 mg/dL (ref 8–23)
CO2: 29 mmol/L (ref 22–32)
Calcium: 9.3 mg/dL (ref 8.9–10.3)
Chloride: 106 mmol/L (ref 98–111)
Creatinine, Ser: 0.92 mg/dL (ref 0.44–1.00)
GFR calc Af Amer: >60 mL/min (ref >60)
GFR calc non Af Amer: >60 mL/min (ref >60)
Glucose, Bld: 126 mg/dL — ABNORMAL HIGH (ref 70–99)
Potassium: 3.7 mmol/L (ref 3.5–5.1)
Sodium: 144 mmol/L (ref 135–145)
Total Bilirubin: 0.8 mg/dL (ref 0.3–1.2)
Total Protein: 6.6 g/dL (ref 6.5–8.1)

## 2020-09-23 LAB — URINALYSIS, ROUTINE W REFLEX MICROSCOPIC
Bilirubin Urine: NEGATIVE
Glucose, UA: NEGATIVE mg/dL
Hgb urine dipstick: NEGATIVE
Ketones, ur: NEGATIVE mg/dL
Leukocytes,Ua: NEGATIVE
Nitrite: NEGATIVE
Protein, ur: NEGATIVE mg/dL
Specific Gravity, Urine: 1.016 (ref 1.005–1.030)
pH: 5 (ref 5.0–8.0)

## 2020-09-23 LAB — AMMONIA: Ammonia: 34 umol/L (ref 9–35)

## 2020-09-23 MED ORDER — MORPHINE SULFATE 15 MG PO TABS: 7.5000 mg | ORAL_TABLET | ORAL | 0 refills | Status: DC | PRN

## 2020-09-23 MED ORDER — OXYCODONE-ACETAMINOPHEN 5-325 MG PO TABS
1.0000 | ORAL_TABLET | Freq: Once | ORAL | Status: AC
  Administered 2020-09-23: 1 via ORAL
  Filled 2020-09-23: qty 1

## 2020-09-23 MED ORDER — DICLOFENAC SODIUM 1 % EX GEL: 4.0000 g | Freq: Four times a day (QID) | CUTANEOUS | 0 refills | Status: AC

## 2020-09-23 NOTE — ED Notes (Signed)
Pt placed on the bedpan  She reports that she cannot use the bedpan

## 2020-09-23 NOTE — Progress Notes (Signed)
Orthopedic Tech Progress Note Patient Details:  Brenda Morgan 02/15/1956 [id number] Fitted patient with TLSO with husband at bedside, took off only needs when out of bed per MD. RN signed for brace. Patient ID: Brenda Morgan, female   DOB: 18-Oct-1956, 64 y.o.   MRN: [id number]   [name] 09/23/2020, 6:48 PM

## 2020-09-23 NOTE — ED Provider Notes (Signed)
[facility] EMERGENCY DEPARTMENT Provider Note   CSN: [id number] Arrival date & time: 09/23/20  4098     History Chief Complaint  Patient presents with  . Fall    Brenda Morgan is a 64 y.o. female.  HPI   Patient with significant medical history of COPD, diabetes, fatty liver disease, lumbar decompression L4/L5 done by Dr. Conchita Paris presents to the emergency department after an unwitnessed fall.  patient states she does not remember how she fell, she states she was on her way to the bathroom and remembers being on the floor.  She denies hitting her head, losing conscious, is not on anticoagulant.  She admits to upper back pain as well as hip and left femur pain.  Patient was recently admitted to the hospital on 08/10 due to sepsis from her previous surgery.  She went for washout by Dr. Jake Samples and then placed on IV antibiotics and a wound VAC.  Patient recently finished her antibiotics as well as had her PICC line removed on 09/21.    Spoke with patient's husband who is at bedside and states he did not see the patient fall but found her on the floor.  He mentions that the patient had been more altered today saying that she was not answering his questions and seemed confused.  He states yesterday the patient was fine riding her lawnmower without any complaints.  He is concerned that she might have liver failure.  Patient denies headache, fever, chills, shortness of breath, chest pain, abdominal pain, nausea, vomiting, diarrhea, worsening pedal edema.  Past Medical History:  Diagnosis Date  . COPD (chronic obstructive pulmonary disease) (HCC)   . Diabetes mellitus without complication (HCC)   . Fatty liver 2012   per pt 06/24/20  . Sleep apnea     Patient Active Problem List   Diagnosis Date Noted  . AKI (acute kidney injury) (HCC) 09/15/2020  . Infection of lumbar spine (HCC) 08/02/2020  . Hardware complicating wound infection (HCC) 08/02/2020  . COPD (chronic  obstructive pulmonary disease) (HCC) 08/01/2020  . DM II (diabetes mellitus, type II), controlled (HCC) 08/01/2020  . HTN (hypertension) 08/01/2020  . Postoperative wound infection 07/31/2020  . Postoperative back pain 07/06/2020  . Lumbar radiculopathy 07/06/2020  . Lumbar spinal stenosis 06/28/2020    Past Surgical History:  Procedure Laterality Date  . BACK SURGERY    . carotid artery surgery Right 10/07/2012  . carotid ultrasound  06/2015  . CHOLECYSTECTOMY  1981  . COLONOSCOPY  2014  . DIAGNOSTIC MAMMOGRAM  03/2017  . groin lypnoid Left 03/2015  . LUMBAR WOUND DEBRIDEMENT N/A 08/02/2020   Procedure: IRRIGATE AND DEBRIDEMENT OF LUMBAR WOUND, PLACEMENT OF WOUND VAC;  Surgeon: Dawley, Alan Mulder, DO;  Location: [facility] OR;  Service: Neurosurgery;  Laterality: N/A;  . right heel spur  1994  . TUBAL LIGATION  1985  . wrist trigger release Right 01/2016     OB History   No obstetric history on file.     No family history on file.  Social History   Tobacco Use  . Smoking status: Current Every Day Smoker    Packs/day: 1.00    Types: Cigarettes  . Smokeless tobacco: Never Used  Substance Use Topics  . Alcohol use: Not Currently  . Drug use: Not Currently    Home Medications Prior to Admission medications   Medication Sig Start Date End Date Taking? Authorizing Provider  atorvastatin (LIPITOR) 10 MG tablet Take 5 mg by mouth  daily.   Yes [provider]  azelastine (ASTELIN) 0.1 % nasal spray Place 2 sprays into both nostrils 2 (two) times daily as needed for rhinitis (congestion).  07/30/20  Yes [provider]  baclofen (LIORESAL) 20 MG tablet Take 20 mg by mouth 3 (three) times daily.   Yes [provider]  carvedilol (COREG) 25 MG tablet Take 25 mg by mouth at bedtime.    Yes [provider]  cetirizine (ZYRTEC) 10 MG tablet Take 10 mg by mouth daily.   Yes [provider]  Cholecalciferol (VITAMIN D) 50 MCG (2000 UT) tablet Take  2,000 Units by mouth daily.   Yes [provider]  D3-50 1.25 MG (50000 UT) capsule Take 50,000 Units by mouth once a week. Sunday 08/09/20  Yes [provider]  doxycycline (VIBRA-TABS) 100 MG tablet Take 1 tablet (100 mg total) by mouth 2 (two) times daily. 09/15/20  Yes Blanchard Kelch, NP  Empagliflozin-metFORMIN HCl ER (SYNJARDY XR) 25-1000 MG TB24 Take 1 tablet by mouth daily.   Yes [provider]  famotidine (PEPCID) 40 MG tablet Take 40 mg by mouth daily.  09/09/20  Yes [provider]  ferrous sulfate 325 (65 FE) MG tablet Take 325 mg by mouth daily with breakfast.   Yes [provider]  Fluticasone-Salmeterol (ADVAIR) 250-50 MCG/DOSE AEPB Inhale 1 puff into the lungs 2 (two) times daily as needed (shortness of breath/wheezing).    Yes [provider]  furosemide (LASIX) 40 MG tablet Take 40 mg by mouth daily.  07/25/20  Yes [provider]  hydrOXYzine (ATARAX/VISTARIL) 25 MG tablet Take 12.5 mg by mouth at bedtime. 07/30/20  Yes [provider]  icosapent Ethyl (VASCEPA) 1 g capsule Take 1 g by mouth 2 (two) times daily.   Yes [provider]  insulin aspart protamine- aspart (NOVOLOG MIX 70/30) (70-30) 100 UNIT/ML injection Inject 65 Units into the skin 2 (two) times daily with a meal.    Yes [provider]  ipratropium-albuterol (DUONEB) 0.5-2.5 (3) MG/3ML SOLN Take 3 mLs by nebulization every 6 (six) hours as needed (shortness of breath/wheezing/asthma).    Yes [provider]  lactulose (CHRONULAC) 10 GM/15ML solution Take 10 g by mouth 3 (three) times daily as needed (constipation).    Yes [provider]  lamoTRIgine (LAMICTAL) 150 MG tablet Take 150 mg by mouth See admin instructions. Take one tablet (150 mg) by mouth every morning with a 25 mg tablet for a total dose of 175 mg daily   Yes [provider]  lamoTRIgine (LAMICTAL) 25 MG tablet Take 25 mg by mouth See admin  instructions. Take one tablet (25 mg) by mouth every morning with a 150 mg tablet for a total dose of 175 mg daily 07/21/20  Yes [provider]  magnesium oxide (MAG-OX) 400 MG tablet Take 400 mg by mouth daily.   Yes [provider]  metolazone (ZAROXOLYN) 5 MG tablet Take 5 mg by mouth every Monday, Wednesday, and Friday.    Yes [provider]  oxyCODONE (OXY IR/ROXICODONE) 5 MG immediate release tablet Take 5 mg by mouth 4 (four) times daily as needed for moderate pain.  08/08/20  Yes [provider]  OXYGEN Inhale 3 L into the lungs at bedtime. Use with BIPAP   Yes [provider]  pantoprazole (PROTONIX) 40 MG tablet Take 40 mg by mouth daily.   Yes [provider]  pioglitazone (ACTOS) 15 MG tablet Take  15 mg by mouth daily.  08/19/20  Yes [provider]  potassium chloride SA (KLOR-CON) 20 MEQ tablet Take 20 mEq by mouth daily.   Yes [provider]  pramipexole (MIRAPEX) 0.5 MG tablet Take 1 mg by mouth at bedtime.   Yes [provider]  pregabalin (LYRICA) 200 MG capsule Take 200 mg by mouth in the morning, at noon, and at bedtime.   Yes [provider]  Semaglutide, 1 MG/DOSE, (OZEMPIC, 1 MG/DOSE,) 2 MG/1.5ML SOPN Inject 1 mg into the skin every Sunday.   Yes [provider]  silver sulfADIAZINE (SILVADENE) 1 % cream Apply 1 application topically daily as needed (leg sores).    Yes [provider]  spironolactone (ALDACTONE) 50 MG tablet Take 50 mg by mouth daily.   Yes [provider]  torsemide (DEMADEX) 20 MG tablet Take 20 mg by mouth daily.  08/12/20  Yes [provider]  traZODone (DESYREL) 100 MG tablet Take 100 mg by mouth at bedtime.  07/31/20  Yes [provider]  methocarbamol (ROBAXIN-750) 750 MG tablet Take 1 tablet (750 mg total) by mouth 3 (three) times daily as needed for muscle spasms. Patient not taking: Reported on 09/23/2020 06/30/20    Alyson Ingles, PA-C  methylPREDNISolone (MEDROL DOSEPAK) 4 MG TBPK tablet  09/07/20   [provider]  rifabutin (MYCOBUTIN) 150 MG capsule Take 2 capsules (300 mg total) by mouth daily. 08/02/20   Cipriano Bunker, MD    Allergies    Amlodipine, Naltrexone, and Penicillins  Review of Systems   Review of Systems  Constitutional: Negative for chills and fever.  HENT: Negative for congestion, tinnitus, trouble swallowing and voice change.   Eyes: Negative for visual disturbance.  Respiratory: Negative for cough and shortness of breath.   Cardiovascular: Negative for chest pain.  Gastrointestinal: Negative for abdominal pain, diarrhea, nausea and vomiting.  Genitourinary: Negative for decreased urine volume, dysuria, enuresis, flank pain, frequency and vaginal bleeding.  Musculoskeletal: Positive for back pain.  Skin: Negative for rash.  Neurological: Negative for dizziness, light-headedness, numbness and headaches.  Hematological: Does not bruise/bleed easily.    Physical Exam Updated Vital Signs BP (!) 130/91   Pulse (!) 126   Temp 98.9 F (37.2 C) (Rectal)   Resp 14   SpO2 96%   Physical Exam Vitals and nursing note reviewed.  Constitutional:      General: She is not in acute distress.    Appearance: Normal appearance. She is not ill-appearing or diaphoretic.  HENT:     Head: Normocephalic and atraumatic.     Nose: No congestion or rhinorrhea.     Mouth/Throat:     Mouth: Mucous membranes are moist.     Pharynx: Oropharynx is clear. No oropharyngeal exudate or posterior oropharyngeal erythema.  Eyes:     General: No visual field deficit or scleral icterus.    Conjunctiva/sclera: Conjunctivae normal.     Pupils: Pupils are equal, round, and reactive to light.  Cardiovascular:     Rate and Rhythm: Normal rate and regular rhythm.     Pulses: Normal pulses.     Heart sounds: No murmur heard.  No friction rub. No gallop.   Pulmonary:     Effort: Pulmonary  effort is normal. No respiratory distress.     Breath sounds: No wheezing, rhonchi or rales.  Abdominal:     General: There is no distension.     Palpations: Abdomen is soft.     Tenderness:  There is no abdominal tenderness. There is no right CVA tenderness, left CVA tenderness or guarding.  Musculoskeletal:        General: Tenderness present. No swelling or signs of injury.     Right lower leg: No edema.     Left lower leg: No edema.     Comments: Patient spine was visualized, no gross abnormalities noted, tenderness to palpation along her cervical spine and lumbar spine no no step-off noted.  Skin:    General: Skin is warm and dry.     Findings: No rash.  Neurological:     General: No focal deficit present.     Mental Status: She is alert and oriented to person, place, and time.     GCS: GCS eye subscore is 4. GCS verbal subscore is 5. GCS motor subscore is 6.     Cranial Nerves: Cranial nerves are intact. No cranial nerve deficit or facial asymmetry.     Sensory: Sensation is intact. No sensory deficit.     Motor: Motor function is intact. No weakness or pronator drift.     Coordination: Coordination is intact. Romberg sign negative. Finger-Nose-Finger Test and Heel to [facility] Test normal.  Psychiatric:        Mood and Affect: Mood normal.     ED Results / Procedures / Treatments   Labs (all labs ordered are listed, but only abnormal results are displayed) Labs Reviewed  COMPREHENSIVE METABOLIC PANEL - Abnormal; Notable for the following components:      Result Value   Glucose, Bld 126 (*)    Albumin 3.1 (*)    All other components within normal limits  CBC WITH DIFFERENTIAL/PLATELET - Abnormal; Notable for the following components:   Hemoglobin 11.8 (*)    All other components within normal limits  AMMONIA  URINALYSIS, ROUTINE W REFLEX MICROSCOPIC    EKG None  Radiology DG Thoracic Spine 2 View  Result Date: 09/23/2020 CLINICAL DATA:  Tenderness after fall this  morning. EXAM: THORACIC SPINE 2 VIEWS COMPARISON:  Cervical and lumbar spine CT earlier today. FINDINGS: T12 compression fracture better appreciated on lumbar spine CT earlier today. The mild T2 compression fracture on cervical spine CT earlier today is obscured by osseous and soft tissue overlap. There is no evidence of acute fracture of the midthoracic spine. Diffuse disc space narrowing and endplate spurring. No acute fracture of included ribs. No paravertebral soft tissue abnormality to suggest acute mid thoracic fracture. IMPRESSION: 1. Mild T2 compression fracture seen on cervical spine CT earlier today is obscured by osseous and soft tissue overlap. 2. The T12 fracture on lumbar spine CT earlier today is not well appreciated radiographically. 3. No evidence of midthoracic spine fracture. Electronically Signed   By: Narda Rutherford M.D.   On: 09/23/2020 17:56   DG Pelvis 1-2 Views  Result Date: 09/23/2020 CLINICAL DATA:  Larey Seat.  Pelvic pain.  Recent lumbar fusion. EXAM: PELVIS - 1-2 VIEW COMPARISON:  None. FINDINGS: Both hips are normally located. No hip fractures are identified. The pubic symphysis and SI joints are intact. No definite pelvic fractures or bone lesions. Lumbar fusion hardware noted. IMPRESSION: No acute bony findings. Electronically Signed   By: Rudie Meyer M.D.   On: 09/23/2020 14:22   CT Head Wo Contrast  Result Date: 09/23/2020 CLINICAL DATA:  Head trauma, abnormal mental status (Age 36-64y) EXAM: CT HEAD WITHOUT CONTRAST TECHNIQUE: Contiguous axial images were obtained from the base of the skull through the vertex without intravenous contrast.  COMPARISON:  None. FINDINGS: Brain: Mild cerebral atrophy. Ventricular prominence likely due to central atrophy. No intracranial hemorrhage, mass effect, or midline shift. The basilar cisterns are patent. Periventricular white matter changes typical of chronic small vessel ischemia. There is a remote lacunar infarct in the right basal  ganglia. No evidence of territorial infarct or acute ischemia. No extra-axial or intracranial fluid collection. Vascular: Atherosclerosis of skullbase vasculature without hyperdense vessel or abnormal calcification. Skull: No fracture or focal lesion. Sinuses/Orbits: Paranasal sinuses and mastoid air cells are clear. The visualized orbits are unremarkable. Other: None. IMPRESSION: 1. No acute intracranial abnormality. No skull fracture. 2. Mild atrophy and chronic small vessel ischemia. Ventricular prominence likely due to central atrophy. Remote lacunar infarct in the right basal ganglia. Electronically Signed   By: Narda RutherfordMelanie  Sanford M.D.   On: 09/23/2020 16:30   CT Cervical Spine Wo Contrast  Result Date: 09/23/2020 CLINICAL DATA:  [age] year old female status post spine fusion in July. Fall this morning with increased pain in the back and legs. Midline tenderness. EXAM: CT CERVICAL SPINE WITHOUT CONTRAST TECHNIQUE: Multidetector CT imaging of the cervical spine was performed without intravenous contrast. Multiplanar CT image reconstructions were also generated. COMPARISON:  Head CT today reported separately. FINDINGS: Alignment: Relatively preserved cervical lordosis. Cervicothoracic junction alignment is within normal limits. Bilateral posterior element alignment is within normal limits. Skull base and vertebrae: Mild motion artifact in the lower cervical spine, most pronounced at C7. Visualized skull base is intact. No atlanto-occipital dissociation. C1 and C2 appear intact and normally aligned. No acute osseous abnormality identified. Soft tissues and spinal canal: No prevertebral fluid or swelling. No visible canal hematoma. Surgical clips along the anterior right carotid space in the upper neck. Superimposed calcified carotid atherosclerosis. There also appeared to be tiny surgical clips along the superior right thyroid. No obvious neck mass or lymphadenopathy. Disc levels:  Mild for age cervical spine  degeneration. Upper chest: Mild motion artifact. Subtle chronic appearing T2 superior endplate compression. Otherwise grossly intact visible upper thoracic levels. Negative lung apices, noncontrast visible superior mediastinum. Other: Negative visible posterior fossa. IMPRESSION: 1. No acute traumatic injury identified in the cervical spine. 2. Mild for age cervical spine degeneration. 3. Subtle chronic appearing T2 superior endplate compression fracture. 4. Multiple surgical clips in the right neck along the anterior carotid space. Electronically Signed   By: Odessa FlemingH  Hall M.D.   On: 09/23/2020 16:34   CT Lumbar Spine Wo Contrast  Result Date: 09/23/2020 CLINICAL DATA:  64 year old female status post spine fusion in July. Fall this morning with increased pain in the back and legs. EXAM: CT LUMBAR SPINE WITHOUT CONTRAST TECHNIQUE: Multidetector CT imaging of the lumbar spine was performed without intravenous contrast administration. Multiplanar CT image reconstructions were also generated. COMPARISON:  Postoperative lumbar radiographs 09/07/2020. Postoperative lumbar MRI 07/31/2020. Preoperative MRI 05/18/2020. FINDINGS: Segmentation: Transitional. Same numbering system used as on the previous MRIs designating a partially lumbarized S1 level with vestigial S1-S2 disc space. By this designation a prior surgery is at L4-L5 and the lowest ribs are at T12. Correlation with radiographs is recommended prior to any operative intervention. Alignment: Mild stable lumbar lordosis residual anterolisthesis since August with of L4 on L5. Vertebrae: Postoperative details are below. Mild superior endplate compression of T12 is new since August, and was not apparent on the radiographs last month (series 9, image 41 and coronal image 41). Elsewhere the T12 vertebra remains intact. There is a subacute to chronic appearing fracture of the right posterior 12th  rib (series 5, image 38). L1 through L3 vertebrae appear intact with chronic  fractures of the right L2 through L4 transverse processes (series 5, image 62 at the L3 level). Visible sacrum and SI joints appear intact. Right L5-S1 assimilation joint noted. Paraspinal and other soft tissues: Absent gallbladder. Mild Aortoiliac calcified atherosclerosis. Negative visible other noncontrast abdominal viscera. Postoperative changes to the posterior paraspinal soft tissues. Disc levels: The visible lower thoracic levels through L2-L3 appear negative. L3-L4: Mild circumferential disc bulge. Moderate posterior element hypertrophy appears increased from the preoperative MRI. Mild if any associated spinal stenosis. Mild to moderate left L3 foraminal stenosis has increased also. L4-L5: Sequelae of decompression and fusion. Bilateral pedicle screws with mildly medial position of both screws at L4 (series 5, image 76). Mild superior L4 endplate deformity is probably stable from the August MRI. Subsidence of the interbody implant into the L5 vertebra. New or increasing inferior L5 compression deformity since August is associated with inferior endplate fracture on the right (series 8, image 37) and lucency along the right L5 pedicle screw (image 35). The L5 posterior elements appear to remain intact. L5-S1: New vacuum disc. Moderate posterior element hypertrophy greater on the left is chronic. Chronic L5 neural foraminal stenosis has not definitely changed. IMPRESSION: 1. Compression fractures of both the T12 superior endplate and the L5 inferior endplate appear new or increased since an August MRI. And the latter is associated with lucency along the right L5 pedicle screw suspicious for hardware loosening. 2. Superimposed subsidence of the interbody implant into the L5 body, and mild compression of the L4 superior endplate appear not significantly changed since August. 3. No other acute osseous abnormality identified. Chronic appearing fractures of the right 12th rib, and the right L2 through L4 transverse  processes. 4. Adjacent segment disease at L3-L4 with mild new spinal and left L3 foraminal stenosis suspected since May. 5.  Aortic Atherosclerosis (ICD10-I70.0). Electronically Signed   By: Odessa Fleming M.D.   On: 09/23/2020 16:46   DG Chest Port 1 View  Result Date: 09/23/2020 CLINICAL DATA:  Back pain after fall EXAM: PORTABLE CHEST 1 VIEW COMPARISON:  07/31/2020 FINDINGS: Patient is rotated. The heart size and mediastinal contours appear within normal limits. Mildly prominent interstitial markings within the periphery of the right lung base. No pleural effusion or pneumothorax. The visualized skeletal structures are unremarkable. IMPRESSION: Mildly prominent interstitial markings within the periphery of the right lung base, which may reflect bronchitic type lung changes, atelectasis, or less likely infiltrate. Overall, little change from the prior study. Electronically Signed   By: Duanne Guess D.O.   On: 09/23/2020 13:42   DG FEMUR MIN 2 VIEWS LEFT  Result Date: 09/23/2020 CLINICAL DATA:  Pt reports spinal fusion in July, states she fell this morning onto her back, has increased pain in back and legs since. EXAM: LEFT FEMUR 2 VIEWS COMPARISON:  None. FINDINGS: Hip is located. No femoral neck fracture. Degenerative narrowing in the medial compartment of the LEFT knee. IMPRESSION: No acute findings Electronically Signed   By: Genevive Bi M.D.   On: 09/23/2020 14:23    Procedures Procedures (including critical care time)  Medications Ordered in ED Medications  oxyCODONE-acetaminophen (PERCOCET/ROXICET) 5-325 MG per tablet 1 tablet (1 tablet Oral Given 09/23/20 1823)    ED Course  I have reviewed the triage vital signs and the nursing notes.  Pertinent labs & imaging results that were available during my care of the patient were reviewed by me and considered  in my medical decision making (see chart for details).    MDM Rules/Calculators/A&P                          Patient presents  after a fall.  She alert but was somnolent during exam. She did not appear to be in acute distress, vital signs reassuring.  Will order screening labs, further imaging for evaluation.   CBC negative for leukocytosis, microcytic anemia present,  Baseline for patient, CMP does not show electrolyte abnormalities, no metabolic acidosis, glucose 126, hypoalbuminemia 3.1, no AKI, no anion gap.  Ammonia 34,  Chest x-ray shows mildly prominent interstitial markings with the periphery of the right lung base may reflect bronchial type lung changes or atelectasis.  X-ray of pelvis-no acute abnormalities, x-ray femur does not show any acute abnormalities.  Head CT does not show any acute abnormalities, CT C-spine does not show any acute abnormalities.  CT lumbar spine shows compression fracture both at T12 and L5 with potential lucency along the right L5 pedal screw suspicion for hardware loosening.  Moderate compression of the L4 superior endplate, does show chronic fractures of the right 12th rib and right L2-L4 transverse process.  Will provide pain meds and reevaluate.  Due to new fractures will consult neurosurgery for further evaluation.  Spoke with [facility] with neurosurgery who has evaluated the imaging and feels patient should be placed in a TLSO and will see her outpatient on Monday.  Patient was reevaluated and states she has some lower back pain.  She is answering all question without any difficulty.  Will provide back brace and walk patient.  If she is able to ambulate independently will DC and can be seen in follow-up at neurosurgery on Monday.  Low suspicion for CVA as patient denies headache, denies change in vision, weakness or paresthesia of the lower extremities, neuro exam was benign.  Head CT did not show any acute findings.  Low suspicion for spinal cord abnormality as she has full range of motion-5 strength in all extremities.  Neurovascular fully intact. Low suspicion for systemic infection  as vital signs reassuring, no obvious source infection on exam, no leukocytosis noted on CBC.  Low suspicion for ACS as patient denies chest pain, shortness of breath, no signs of hypoperfusion or fluid overload on exam.  I suspect patient suffered a mechanical fall resulting in fractures of her lumbar spine.  Will place patient in a back splint and provide pain management.  Due to shift change patient will be handed off to Dr. Adela Lank he was provided HPI, current work-up, likely disposition.  Pending urinalysis and pain is under control patient can be DC with pain meds and follow-up with neurosurgery for further evaluation.   Final Clinical Impression(s) / ED Diagnoses Final diagnoses:  Fall, initial encounter  Compression fracture of L5 vertebra, initial encounter [facility])    Rx / DC Orders ED Discharge Orders    None       Carroll Sage, PA-C 09/23/20 1910    Melene Plan, DO 09/23/20 2033

## 2020-09-23 NOTE — ED Notes (Signed)
Walked in the hallway with her own personal; walker she did well

## 2020-09-23 NOTE — ED Notes (Signed)
Pain med given 

## 2020-09-23 NOTE — Discharge Instructions (Addendum)
Take tylenol 1000mg (2 extra strength) four times a day. Use the gel as prescribed.   Then take the pain medicine if you feel like you need it. Narcotics do not help with the pain, they only make you care about it less.  You can become addicted to this, people may break into your house to steal it.  It will constipate you.  If you drive under the influence of this medicine you can get a DUI.    Follow up with your spinal doctor. Return for worsening pain or if you are concerned for your safety at home.

## 2020-09-23 NOTE — ED Triage Notes (Signed)
Pt reports spinal fusion in July, states she fell this morning onto her back, has increased pain in back and legs since. Denies LOC, not on thinners, no loss of bowel or bladder control.

## 2020-09-30 NOTE — Assessment & Plan Note (Signed)
We will continue daptomycin IV along with ceftriaxone. Would suspect staph species or other skin flora. Will need suppressive regimen going forward after induction phase. We will have her return again at the end of therapy to make decisions regarding the next steps.

## 2020-09-30 NOTE — Assessment & Plan Note (Signed)
We will continue rifabutin at least the first 6 weeks. She has had some nausea with it previously but seems to be doing better but that overall now.

## 2020-09-30 NOTE — Assessment & Plan Note (Signed)
Improved since switching from vancomycin. Continue to push fluids and avoid nephrotoxic agents including NSAIDs to help with recovery.

## 2020-10-06 IMAGING — DX DG CHEST 1V PORT
1 series · 1 of 1 positions shown · non-contrast
Comparison: None.

CLINICAL DATA: Shortness of breath

EXAM:
PORTABLE CHEST 1 VIEW

[chest ap]
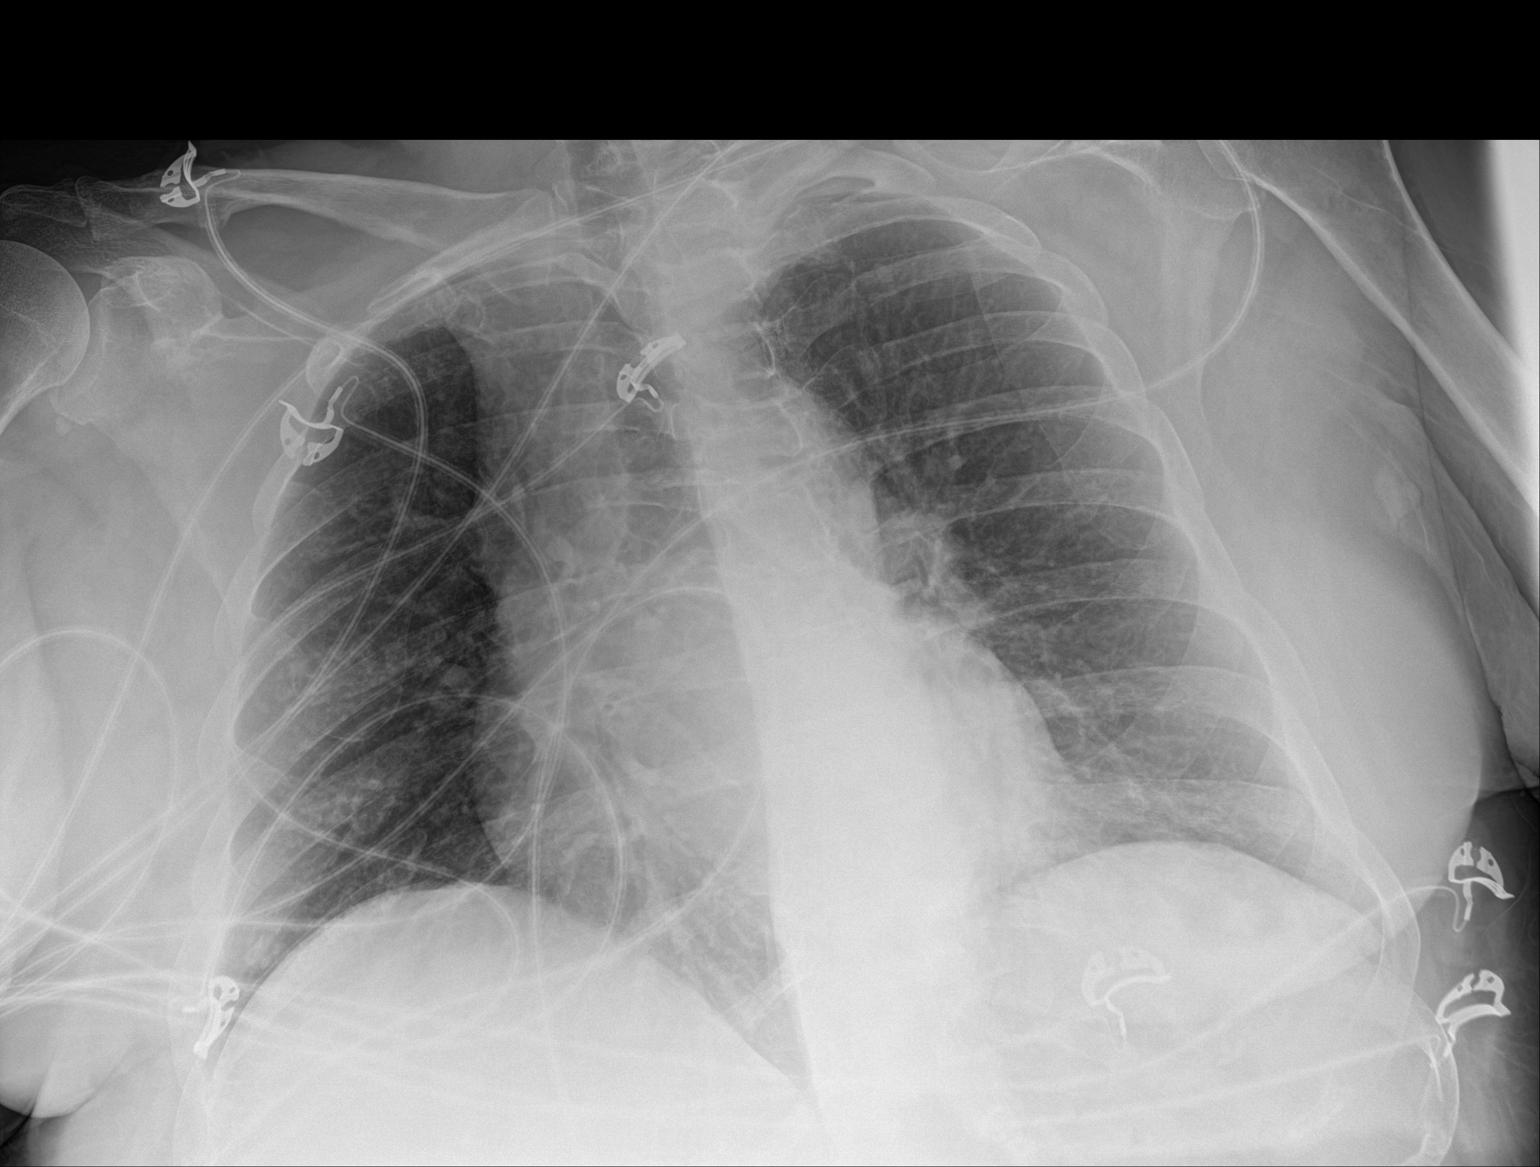

[1 of 1 positions shown; findings below may reference images not displayed]

FINDINGS: Heart and mediastinal contours are within normal limits. No focal
opacities or effusions. No acute bony abnormality.
IMPRESSION: No active disease.

## 2020-10-13 ENCOUNTER — Observation Stay (HOSPITAL_COMMUNITY)
Admission: EM | Admit: 2020-10-13 | Discharge: 2020-10-14 | Disposition: A | Discharge status: Home / Self Care | Payer: BC Managed Care – PPO | Attending: Internal Medicine | Admitting: Internal Medicine

## 2020-10-13 ENCOUNTER — Observation Stay (HOSPITAL_COMMUNITY): Payer: BC Managed Care – PPO

## 2020-10-13 ENCOUNTER — Other Ambulatory Visit: Payer: Self-pay

## 2020-10-13 ENCOUNTER — Encounter (HOSPITAL_COMMUNITY): Payer: Self-pay | Admitting: Emergency Medicine

## 2020-10-13 ENCOUNTER — Emergency Department (HOSPITAL_COMMUNITY): Payer: BC Managed Care – PPO

## 2020-10-13 DIAGNOSIS — F17210 Nicotine dependence, cigarettes, uncomplicated: Secondary | ICD-10-CM | POA: Diagnosis not present

## 2020-10-13 DIAGNOSIS — Z794 Long term (current) use of insulin: Secondary | ICD-10-CM | POA: Diagnosis not present

## 2020-10-13 DIAGNOSIS — R5383 Other fatigue: Secondary | ICD-10-CM

## 2020-10-13 DIAGNOSIS — Z20822 Contact with and (suspected) exposure to covid-19: Secondary | ICD-10-CM | POA: Diagnosis not present

## 2020-10-13 DIAGNOSIS — Z9981 Dependence on supplemental oxygen: Secondary | ICD-10-CM | POA: Diagnosis not present

## 2020-10-13 DIAGNOSIS — E119 Type 2 diabetes mellitus without complications: Secondary | ICD-10-CM | POA: Diagnosis not present

## 2020-10-13 DIAGNOSIS — R4182 Altered mental status, unspecified: Secondary | ICD-10-CM

## 2020-10-13 DIAGNOSIS — I152 Hypertension secondary to endocrine disorders: Secondary | ICD-10-CM | POA: Diagnosis present

## 2020-10-13 DIAGNOSIS — M4626 Osteomyelitis of vertebra, lumbar region: Secondary | ICD-10-CM | POA: Diagnosis present

## 2020-10-13 DIAGNOSIS — J449 Chronic obstructive pulmonary disease, unspecified: Secondary | ICD-10-CM | POA: Diagnosis not present

## 2020-10-13 DIAGNOSIS — Z79899 Other long term (current) drug therapy: Secondary | ICD-10-CM | POA: Insufficient documentation

## 2020-10-13 DIAGNOSIS — G9340 Encephalopathy, unspecified: Principal | ICD-10-CM | POA: Diagnosis present

## 2020-10-13 DIAGNOSIS — N179 Acute kidney failure, unspecified: Secondary | ICD-10-CM | POA: Diagnosis present

## 2020-10-13 DIAGNOSIS — R531 Weakness: Secondary | ICD-10-CM | POA: Diagnosis present

## 2020-10-13 DIAGNOSIS — I10 Essential (primary) hypertension: Secondary | ICD-10-CM | POA: Diagnosis present

## 2020-10-13 DIAGNOSIS — R0902 Hypoxemia: Secondary | ICD-10-CM

## 2020-10-13 DIAGNOSIS — E1122 Type 2 diabetes mellitus with diabetic chronic kidney disease: Secondary | ICD-10-CM

## 2020-10-13 DIAGNOSIS — R7981 Abnormal blood-gas level: Secondary | ICD-10-CM

## 2020-10-13 LAB — URINALYSIS, ROUTINE W REFLEX MICROSCOPIC
Bacteria, UA: NONE SEEN
Bilirubin Urine: NEGATIVE
Glucose, UA: >=500 mg/dL — AB
Hgb urine dipstick: NEGATIVE
Ketones, ur: NEGATIVE mg/dL
Leukocytes,Ua: NEGATIVE
Nitrite: NEGATIVE
Protein, ur: NEGATIVE mg/dL
Specific Gravity, Urine: 1.016 (ref 1.005–1.030)
pH: 6 (ref 5.0–8.0)

## 2020-10-13 LAB — I-STAT CHEM 8, ED
BUN: 21 mg/dL (ref 8–23)
Calcium, Ion: 1.17 mmol/L (ref 1.15–1.40)
Chloride: 99 mmol/L (ref 98–111)
Creatinine, Ser: 1.1 mg/dL — ABNORMAL HIGH (ref 0.44–1.00)
Glucose, Bld: 161 mg/dL — ABNORMAL HIGH (ref 70–99)
HCT: 39 % (ref 36.0–46.0)
Hemoglobin: 13.3 g/dL (ref 12.0–15.0)
Potassium: 4.2 mmol/L (ref 3.5–5.1)
Sodium: 140 mmol/L (ref 135–145)
TCO2: 35 mmol/L — ABNORMAL HIGH (ref 22–32)

## 2020-10-13 LAB — DIFFERENTIAL
Abs Immature Granulocytes: 0.08 10*3/uL — ABNORMAL HIGH (ref 0.00–0.07)
Basophils Absolute: 0 10*3/uL (ref 0.0–0.1)
Basophils Relative: 0 %
Eosinophils Absolute: 0.2 10*3/uL (ref 0.0–0.5)
Eosinophils Relative: 2 %
Immature Granulocytes: 1 %
Lymphocytes Relative: 20 %
Lymphs Abs: 2.5 10*3/uL (ref 0.7–4.0)
Monocytes Absolute: 0.7 10*3/uL (ref 0.1–1.0)
Monocytes Relative: 6 %
Neutro Abs: 9 10*3/uL — ABNORMAL HIGH (ref 1.7–7.7)
Neutrophils Relative %: 71 %

## 2020-10-13 LAB — I-STAT ARTERIAL BLOOD GAS, ED
Acid-Base Excess: 9 mmol/L — ABNORMAL HIGH (ref 0.0–2.0)
Acid-Base Excess: 10 mmol/L — ABNORMAL HIGH (ref 0.0–2.0)
Bicarbonate: 35 mmol/L — ABNORMAL HIGH (ref 20.0–28.0)
Bicarbonate: 36.3 mmol/L — ABNORMAL HIGH (ref 20.0–28.0)
Calcium, Ion: 1.21 mmol/L (ref 1.15–1.40)
Calcium, Ion: 1.24 mmol/L (ref 1.15–1.40)
HCT: 36 % (ref 36.0–46.0)
HCT: 33 % — ABNORMAL LOW (ref 36.0–46.0)
Hemoglobin: 12.2 g/dL (ref 12.0–15.0)
Hemoglobin: 11.2 g/dL — ABNORMAL LOW (ref 12.0–15.0)
O2 Saturation: 91 %
O2 Saturation: 99 %
Patient temperature: 98.1
Patient temperature: 97.6
Potassium: 3.7 mmol/L (ref 3.5–5.1)
Potassium: 3.6 mmol/L (ref 3.5–5.1)
Sodium: 143 mmol/L (ref 135–145)
Sodium: 142 mmol/L (ref 135–145)
TCO2: 37 mmol/L — ABNORMAL HIGH (ref 22–32)
TCO2: 38 mmol/L — ABNORMAL HIGH (ref 22–32)
pCO2 arterial: 51.2 mmHg — ABNORMAL HIGH (ref 32.0–48.0)
pCO2 arterial: 54 mmHg — ABNORMAL HIGH (ref 32.0–48.0)
pH, Arterial: 7.441 (ref 7.350–7.450)
pH, Arterial: 7.432 (ref 7.350–7.450)
pO2, Arterial: 59 mmHg — ABNORMAL LOW (ref 83.0–108.0)
pO2, Arterial: 141 mmHg — ABNORMAL HIGH (ref 83.0–108.0)

## 2020-10-13 LAB — RESPIRATORY PANEL BY RT PCR (FLU A&B, COVID)
Influenza A by PCR: NEGATIVE
Influenza B by PCR: NEGATIVE
SARS Coronavirus 2 by RT PCR: NEGATIVE

## 2020-10-13 LAB — COMPREHENSIVE METABOLIC PANEL
ALT: 21 U/L (ref 0–44)
AST: 15 U/L (ref 15–41)
Albumin: 3.3 g/dL — ABNORMAL LOW (ref 3.5–5.0)
Alkaline Phosphatase: 176 U/L — ABNORMAL HIGH (ref 38–126)
Anion gap: 12 (ref 5–15)
BUN: 15 mg/dL (ref 8–23)
CO2: 31 mmol/L (ref 22–32)
Calcium: 9.6 mg/dL (ref 8.9–10.3)
Chloride: 98 mmol/L (ref 98–111)
Creatinine, Ser: 1.2 mg/dL — ABNORMAL HIGH (ref 0.44–1.00)
GFR, Estimated: 51 mL/min — ABNORMAL LOW (ref >60)
Glucose, Bld: 167 mg/dL — ABNORMAL HIGH (ref 70–99)
Potassium: 3.9 mmol/L (ref 3.5–5.1)
Sodium: 141 mmol/L (ref 135–145)
Total Bilirubin: 0.8 mg/dL (ref 0.3–1.2)
Total Protein: 6.9 g/dL (ref 6.5–8.1)

## 2020-10-13 LAB — CBC
HCT: 40.9 % (ref 36.0–46.0)
Hemoglobin: 12.6 g/dL (ref 12.0–15.0)
MCH: 28.7 pg (ref 26.0–34.0)
MCHC: 30.8 g/dL (ref 30.0–36.0)
MCV: 93.2 fL (ref 80.0–100.0)
Platelets: 216 10*3/uL (ref 150–400)
RBC: 4.39 MIL/uL (ref 3.87–5.11)
RDW: 15.2 % (ref 11.5–15.5)
WBC: 12.6 10*3/uL — ABNORMAL HIGH (ref 4.0–10.5)
nRBC: 0 % (ref 0.0–0.2)

## 2020-10-13 LAB — PROTIME-INR
INR: 1 (ref 0.8–1.2)
Prothrombin Time: 12.7 seconds (ref 11.4–15.2)

## 2020-10-13 LAB — RAPID URINE DRUG SCREEN, HOSP PERFORMED
Amphetamines: NOT DETECTED
Barbiturates: NOT DETECTED
Benzodiazepines: NOT DETECTED
Cocaine: NOT DETECTED
Opiates: NOT DETECTED
Tetrahydrocannabinol: NOT DETECTED

## 2020-10-13 LAB — APTT: aPTT: 31 seconds (ref 24–36)

## 2020-10-13 LAB — CBG MONITORING, ED: Glucose-Capillary: 119 mg/dL — ABNORMAL HIGH (ref 70–99)

## 2020-10-13 LAB — AMMONIA: Ammonia: 17 umol/L (ref 9–35)

## 2020-10-13 MED ORDER — ENOXAPARIN SODIUM 40 MG/0.4ML ~~LOC~~ SOLN
40.0000 mg | SUBCUTANEOUS | Status: DC
  Administered 2020-10-13: 40 mg via SUBCUTANEOUS
  Filled 2020-10-13: qty 0.4

## 2020-10-13 MED ORDER — LACTULOSE 10 GM/15ML PO SOLN
10.0000 g | Freq: Three times a day (TID) | ORAL | Status: DC | PRN
  Filled 2020-10-13: qty 15

## 2020-10-13 MED ORDER — INSULIN ASPART 100 UNIT/ML ~~LOC~~ SOLN
0.0000 [IU] | Freq: Three times a day (TID) | SUBCUTANEOUS | Status: DC
  Administered 2020-10-14 (×2): 2 [IU] via SUBCUTANEOUS

## 2020-10-13 MED ORDER — OXYCODONE HCL 5 MG PO TABS: 5.0000 mg | ORAL_TABLET | Freq: Four times a day (QID) | ORAL | Status: DC | PRN

## 2020-10-13 MED ORDER — FAMOTIDINE 20 MG PO TABS
40.0000 mg | ORAL_TABLET | Freq: Every day | ORAL | Status: DC
  Administered 2020-10-14: 40 mg via ORAL
  Filled 2020-10-13: qty 2

## 2020-10-13 MED ORDER — CARVEDILOL 12.5 MG PO TABS: 25.0000 mg | ORAL_TABLET | Freq: Every day | ORAL | Status: DC

## 2020-10-13 MED ORDER — IPRATROPIUM-ALBUTEROL 0.5-2.5 (3) MG/3ML IN SOLN: 3.0000 mL | Freq: Four times a day (QID) | RESPIRATORY_TRACT | Status: DC | PRN

## 2020-10-13 MED ORDER — ATORVASTATIN CALCIUM 10 MG PO TABS
5.0000 mg | ORAL_TABLET | Freq: Every day | ORAL | Status: DC
  Administered 2020-10-14: 5 mg via ORAL
  Filled 2020-10-13: qty 1

## 2020-10-13 MED ORDER — LOSARTAN POTASSIUM 50 MG PO TABS
25.0000 mg | ORAL_TABLET | Freq: Every day | ORAL | Status: DC
  Administered 2020-10-14: 25 mg via ORAL
  Filled 2020-10-13: qty 1

## 2020-10-13 MED ORDER — SPIRONOLACTONE 25 MG PO TABS
50.0000 mg | ORAL_TABLET | Freq: Every day | ORAL | Status: DC
  Administered 2020-10-14: 50 mg via ORAL
  Filled 2020-10-13: qty 2

## 2020-10-13 MED ORDER — MOMETASONE FURO-FORMOTEROL FUM 200-5 MCG/ACT IN AERO: 2.0000 | INHALATION_SPRAY | Freq: Two times a day (BID) | RESPIRATORY_TRACT | Status: DC

## 2020-10-13 MED ORDER — ICOSAPENT ETHYL 1 G PO CAPS
1.0000 g | ORAL_CAPSULE | Freq: Two times a day (BID) | ORAL | Status: DC
  Administered 2020-10-14: 1 g via ORAL
  Filled 2020-10-13 (×2): qty 1

## 2020-10-13 MED ORDER — SODIUM CHLORIDE 0.9% FLUSH
3.0000 mL | Freq: Two times a day (BID) | INTRAVENOUS | Status: DC
  Administered 2020-10-14 (×2): 3 mL via INTRAVENOUS

## 2020-10-13 MED ORDER — PANTOPRAZOLE SODIUM 40 MG PO TBEC
40.0000 mg | DELAYED_RELEASE_TABLET | Freq: Every day | ORAL | Status: DC
  Administered 2020-10-14: 40 mg via ORAL
  Filled 2020-10-13: qty 1

## 2020-10-13 MED ORDER — TORSEMIDE 20 MG PO TABS
20.0000 mg | ORAL_TABLET | Freq: Every day | ORAL | Status: DC
  Administered 2020-10-14: 20 mg via ORAL
  Filled 2020-10-13: qty 1

## 2020-10-13 MED ORDER — INSULIN ASPART PROT & ASPART (70-30 MIX) 100 UNIT/ML PEN: 40.0000 [IU] | PEN_INJECTOR | Freq: Two times a day (BID) | SUBCUTANEOUS | Status: DC

## 2020-10-13 MED ORDER — INSULIN ASPART 100 UNIT/ML ~~LOC~~ SOLN: 0.0000 [IU] | Freq: Every day | SUBCUTANEOUS | Status: DC

## 2020-10-13 MED ORDER — MAGNESIUM OXIDE 400 (241.3 MG) MG PO TABS
400.0000 mg | ORAL_TABLET | Freq: Every day | ORAL | Status: DC
  Administered 2020-10-14: 400 mg via ORAL
  Filled 2020-10-13 (×2): qty 1

## 2020-10-13 NOTE — ED Provider Notes (Signed)
MSE was initiated and I personally evaluated the patient and placed orders (if any) at  3:17 PM on October 13, 2020.  Patient is 64 year old female last seen normal 9 PM last night patient's husband is at bedside states that she has been extremely weak this morning.  No fevers and no cough congestion.  He denies any alcohol use or drug use.  She has never had a stroke before per husband.  CONSTITUTIONAL:   Patient is a 64 year old female appears extremely weak and shaky diffusely. NEURO:   She is oriented to self but not oriented to month day or year.  She is not oriented to circumstance or current events.  Speech is fluent, clear without dysarthria or dysphasia. But she has a very tremulous voice and is too weak to speak in long sentences.  Strength severely weak in all extremities barely able to hold arms up against gravity bilaterally same with legs.  No focal weakness. Sensation intact in upper/lower extremities   No pronator drift.   CN I not tested  CN II grossly intact visual fields bilaterally. Did not visualize posterior eye.  CN III, IV, VI PERRLA and EOMs intact bilaterally  CN V Intact sensation to touch to the face  CN VII facial movements symmetric  CN VIII not tested  CN IX, X no uvula deviation, symmetric rise of soft palate  CN XI 5/5 SCM and trapezius strength bilaterally  CN XII Midline tongue protrusion, symmetric L/R movements  EYES:  pupils equal and reactive ENT/NECK:  trachea midline, no JVD CARDIO:   Tachycardic rate, reg rhythm, well-perfused PULM:   Mild tachypnea breathing, lungs are clear to brief auscultation GI/GU:  Abdomen non-distended MSK/SPINE:  No gross deformities, no edema SKIN:  no rash obvious, atraumatic, no ecchymosis    Patient appears ill but is VAN and negative.  No focal symptoms.  Well outside of TPA window.  I informed nurse that patient needs to be a priority 1 to move to back for evaluation.  She is significantly hypertensive this may  be PRES versus intracranial hemorrhage versus metabolic encephalopathy.  She is no evidence of infection on my perfunctory exam however she will require further evaluation.  CT head ordered and basic labs obtained.  The patient appears stable so that the remainder of the MSE may be completed by another provider.   Solon Augusta [place], [place] 10/13/20 1526    Jacalyn Lefevre, MD 10/13/20 1606

## 2020-10-13 NOTE — ED Notes (Signed)
Pt evaluated by PA on triage no stroke activation at this time.

## 2020-10-13 NOTE — ED Notes (Signed)
Pt's spouse took purse home.

## 2020-10-13 NOTE — H&P (Addendum)
History and Physical   Brenda Morgan FEX:[id number] DOB: Sep 14, 1956 DOA: 10/13/2020  PCP: [facility], [facility]   Patient coming from: Home  Chief Complaint: lethargy, general weakness  HPI: Brenda Morgan is a 64 y.o. 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  History obtained with the assistance of her husband given her lethargy and drowsiness.  Patient was at baseline when she went to bed at 8 PM the night before.  When her husband spoke to her at 35 AM she states that since she woke up at 6 AM she was feeling sleepy and generally weak.  Patient reports ongoing neck and back pain that is unchanged;  As well as weakness in her legs which is unchanged.  She reports new upper extremity generalized weakness bilaterally.  She denies fevers, cough, chest pain, shortness of breath, abdominal pain, nausea, constipation/diarrhea, visual changes.  She has had a recent admission and treatment for lumbar spine infection.  She presented with some altered mental status in August.  She has been on suppressive doxycycline after finishing her outpatient IV antibiotics.  Her doxycycline appears to be due to be stopped at this time.  No change in her back pain.  She additionally has history of OSA on BiPAP, but does not consistently use her BiPAP.  She has not used it for the last 3 days at least.  ED Course: Vital signs stable in ED.  Labs significant for mildly elevated creatinine at 1.2 from a baseline of 0.9.  White count mildly elevated to 12.6.  Mild alk phos elevation to 176.  Electrolytes all within normal limits, PT PTT within normal limits, calcium within normal limits, ammonia within normal limits.  ABG showed normal pH of 7.4 mild  elevation in PCO2 at 51 and PO2 of 59 (however pulse ox within normal limits.).   Review of Systems: As per HPI otherwise all other systems reviewed and are negative.  Past Medical History:  Diagnosis Date  . COPD (chronic obstructive pulmonary disease) ([place])   . Diabetes mellitus without complication ([place])   . Fatty liver 2012   per pt 06/24/20  . Sleep apnea     Past Surgical History:  Procedure Laterality Date  . BACK SURGERY    . carotid artery surgery Right 10/07/2012  . carotid ultrasound  06/2015  . CHOLECYSTECTOMY  1981  . COLONOSCOPY  2014  . DIAGNOSTIC MAMMOGRAM  03/2017  . groin lypnoid Left 03/2015  . LUMBAR WOUND DEBRIDEMENT N/A 08/02/2020   Procedure: IRRIGATE AND DEBRIDEMENT OF LUMBAR WOUND, PLACEMENT OF WOUND VAC;  Surgeon: Dawley, Theodoro Doing, DO;  Location: [facility];  Service: Neurosurgery;  Laterality: N/A;  . right heel spur  1994  . TUBAL LIGATION  1985  . wrist trigger release Right 01/2016    Social History  reports that she has been smoking cigarettes. She has been smoking about 1.00 pack per day. She has never used smokeless tobacco. She reports previous alcohol use. She reports previous drug use.  Allergies  Allergen Reactions  . Amlodipine Anaphylaxis, Swelling and Other (See Comments)    Tongue swelling   . Naltrexone Hives  . Penicillins Hives and Other (See Comments)    Tolerated ceftriaxone and cefepime 06/2017 Hives      Family History  Problem  Relation Age of Onset  . Diabetes Mother   . Heart disease Mother   . Stomach cancer Mother   . Diabetes Father   . Heart disease Father   . Lung cancer Father     Prior to Admission medications   Medication Sig Start Date End Date Taking? Authorizing Provider  atorvastatin (LIPITOR) 10 MG tablet Take 5 mg by mouth daily.   Yes [provider]  azelastine (ASTELIN) 0.1 % nasal spray Place 2 sprays into both nostrils 2 (two) times daily as needed for rhinitis (congestion).  07/30/20  Yes  [provider]  baclofen (LIORESAL) 20 MG tablet Take 20 mg by mouth at bedtime.    Yes [provider]  carvedilol (COREG) 25 MG tablet Take 25 mg by mouth at bedtime.    Yes [provider]  cetirizine (ZYRTEC) 10 MG tablet Take 10 mg by mouth daily.   Yes [provider]  Cholecalciferol (VITAMIN D) 50 MCG (2000 UT) tablet Take 2,000 Units by mouth daily.   Yes [provider]  D3-50 1.25 MG (50000 UT) capsule Take 50,000 Units by mouth once a week. Sunday 08/09/20  Yes [provider]  diclofenac Sodium (VOLTAREN) 1 % GEL Apply 4 g topically 4 (four) times daily. Patient taking differently: Apply 4 g topically 4 (four) times daily as needed (pain).  09/23/20  Yes Deno Etienne, DO  Empagliflozin-metFORMIN HCl ER (SYNJARDY XR) 25-1000 MG TB24 Take 1 tablet by mouth daily.   Yes [provider]  famotidine (PEPCID) 40 MG tablet Take 40 mg by mouth daily.  09/09/20  Yes [provider]  ferrous sulfate 325 (65 FE) MG tablet Take 325 mg by mouth daily with breakfast.   Yes [provider]  Fluticasone-Salmeterol (ADVAIR) 250-50 MCG/DOSE AEPB Inhale 1 puff into the lungs 2 (two) times daily as needed (shortness of breath/wheezing).    Yes [provider]  hydrOXYzine (ATARAX/VISTARIL) 25 MG tablet Take 12.5 mg by mouth at bedtime. 07/30/20  Yes [provider]  icosapent Ethyl (VASCEPA) 1 g capsule Take 1 g by mouth 2 (two) times daily.   Yes [provider]  ipratropium-albuterol (DUONEB) 0.5-2.5 (3) MG/3ML SOLN Take 3 mLs by nebulization every 6 (six) hours as needed (shortness of breath/wheezing/asthma).    Yes [provider]  lactulose (CHRONULAC) 10 GM/15ML solution Take 10 g by mouth 3 (three) times daily as needed (constipation).    Yes [provider]  lamoTRIgine (LAMICTAL) 150 MG tablet Take 150 mg by mouth See admin instructions. Take one tablet (150 mg) by mouth every  morning with a 25 mg tablet for a total dose of 175 mg daily   Yes [provider]  lamoTRIgine (LAMICTAL) 25 MG tablet Take 25 mg by mouth See admin instructions. Take one tablet (25 mg) by mouth every morning with a 150 mg tablet for a total dose of 175 mg daily 07/21/20  Yes [provider]  losartan (COZAAR) 25 MG tablet Take 25 mg by mouth daily. 09/28/20  Yes [provider]  magnesium oxide (MAG-OX) 400 MG tablet Take 400 mg by mouth daily.   Yes [provider]  metolazone (ZAROXOLYN) 5 MG tablet Take 5 mg by mouth every Monday, Wednesday, and Friday.    Yes [provider]  NOVOLOG MIX 70/30 FLEXPEN (70-30) 100 UNIT/ML FlexPen Inject 60 Units into the skin 2 (two) times daily with a meal.  10/10/20  Yes [provider]  oxyCODONE (OXY IR/ROXICODONE) 5 MG immediate release tablet Take 5 mg by mouth 4 (four) times daily as needed for moderate pain.  08/08/20  Yes [provider]  OXYGEN Inhale 3 L into the lungs at bedtime. Use with BIPAP   Yes [provider]  OZEMPIC, 1 MG/DOSE, 4 MG/3ML SOPN Inject 1 mg into the skin once a week. Take on Sunday 09/14/20  Yes [provider]  pantoprazole (PROTONIX) 40 MG tablet Take 40 mg by mouth daily.   Yes [provider]  pioglitazone (ACTOS) 15 MG tablet Take 15 mg by mouth daily.  08/19/20  Yes [provider]  potassium chloride SA (KLOR-CON) 20 MEQ tablet Take 20 mEq by mouth daily.   Yes [provider]  pramipexole (MIRAPEX) 0.5 MG tablet Take 1 mg by mouth at bedtime.   Yes [provider]  pregabalin (LYRICA) 200 MG capsule Take 200 mg by mouth in the morning, at noon, and at bedtime.   Yes [provider]  silver sulfADIAZINE (SILVADENE) 1 % cream Apply 1 application topically daily as needed (leg sores).    Yes [provider]  spironolactone (ALDACTONE) 50 MG tablet Take 50 mg by mouth daily.   Yes [provider]  torsemide (DEMADEX) 20 MG tablet Take 20 mg by mouth daily.  08/12/20  Yes [provider]  traZODone (DESYREL) 100 MG tablet Take 100 mg by mouth at bedtime.  07/31/20  Yes [provider]  doxycycline (VIBRA-TABS) 100 MG tablet Take 1 tablet (100 mg total) by mouth 2 (two) times daily. Patient not taking: Reported on 10/13/2020 09/15/20   Richland Callas, NP  methocarbamol (ROBAXIN-750) 750 MG tablet Take 1 tablet (750 mg total) by mouth 3 (three) times daily as needed for muscle spasms. Patient not taking: Reported on 09/23/2020 06/30/20   Traci Sermon, PA-C  morphine (MSIR) 15 MG tablet Take 0.5 tablets (7.5 mg total) by mouth every 4 (four) hours as needed for severe pain. Patient not taking: Reported on 10/13/2020 09/23/20   Deno Etienne, DO  rifabutin [facility]) 150 MG capsule Take 2 capsules (300 mg total) by mouth daily. 08/02/20   Shawna Clamp, MD    Physical Exam: Vitals:   10/13/20 2130 10/13/20 2145 10/13/20 2159 10/13/20 2310  BP: (!) 109/54 (!) 97/53 (!) 126/57   Pulse: 63 62 62   Resp:   20 18  Temp:   98 F (36.7 C)   TempSrc:   Oral   SpO2: 100% 98%    Weight:      Height:       Physical Exam Constitutional:      General: She is not in acute distress.    Appearance: Normal appearance.     Comments: Drowsy obese female  HENT:     Head: Normocephalic and atraumatic.     Mouth/Throat:     Mouth: Mucous membranes are moist.     Pharynx: Oropharynx is clear.  Eyes:     Extraocular Movements: Extraocular movements intact.     Pupils: Pupils are equal, round, and reactive to light.  Cardiovascular:     Rate and Rhythm: Normal rate and regular rhythm.     Pulses: Normal pulses.     Heart sounds: Normal heart sounds.  Pulmonary:     Effort: Pulmonary effort is normal. No respiratory distress.     Breath sounds: Normal breath sounds.  Abdominal:     General: Bowel sounds are normal. There  is no distension.     Palpations:  Abdomen is soft.     Tenderness: There is no abdominal tenderness.  Musculoskeletal:        General: No swelling or deformity.  Skin:    General: Skin is warm and dry.  Neurological:     General: No focal deficit present.     Comments: Mental Status: Patient is awake, alert to voice but drowsy, oriented to person place but not year No signs of aphasia or neglect Cranial Nerves: II: Pupils equal but constricted, round, and minimally reactive to light.  III,IV, VI: Unable to maintain lateral gaze with either eye in either direction.  Eyes will drift back to midline. V: Facial sensation is symmetric tolight touch VII: Facial movement is symmetric.  VIII: hearing is intact to voice X: Uvula elevates symmetrically XI: Shoulder shrug is symmetric. XII: tongue is midline without atrophy or fasciculations.  Motor: Reduced effort thorughout, at Least 4+/5 bilateral UE, 4+/5 bilateral lower extremitiy Sensory: Sensation is grossly intact bilateral UEs & LEs Cerebellar: Finger-Nose intact bilalat     Labs on Admission: I have personally reviewed following labs and imaging studies  CBC: Recent Labs  Lab 10/13/20 1529 10/13/20 1556 10/13/20 1634 10/13/20 2141  WBC 12.6*  --   --   --   NEUTROABS 9.0*  --   --   --   HGB 12.6 13.3 12.2 11.2*  HCT 40.9 39.0 36.0 33.0*  MCV 93.2  --   --   --   PLT 216  --   --   --     Basic Metabolic Panel: Recent Labs  Lab 10/13/20 1529 10/13/20 1556 10/13/20 1634 10/13/20 2141  NA 141 140 143 142  K 3.9 4.2 3.7 3.6  CL 98 99  --   --   CO2 31  --   --   --   GLUCOSE 167* 161*  --   --   BUN 15 21  --   --   CREATININE 1.20* 1.10*  --   --   CALCIUM 9.6  --   --   --     GFR: Estimated Creatinine Clearance: 58.4 mL/min (A) (by C-G formula based on SCr of 1.1 mg/dL (H)).  Liver Function Tests: Recent Labs  Lab 10/13/20 1529  AST 15  ALT 21  ALKPHOS 176*  BILITOT 0.8  PROT 6.9  ALBUMIN 3.3*    Urine analysis:      Component Value Date/Time   COLORURINE YELLOW 10/13/2020 1855   APPEARANCEUR CLEAR 10/13/2020 1855   LABSPEC 1.016 10/13/2020 1855   PHURINE 6.0 10/13/2020 1855   GLUCOSEU >=500 (A) 10/13/2020 1855   HGBUR NEGATIVE 10/13/2020 1855   BILIRUBINUR NEGATIVE 10/13/2020 1855   KETONESUR NEGATIVE 10/13/2020 1855   PROTEINUR NEGATIVE 10/13/2020 1855   NITRITE NEGATIVE 10/13/2020 1855   LEUKOCYTESUR NEGATIVE 10/13/2020 1855    Radiological Exams on Admission: CT HEAD WO CONTRAST  Result Date: 10/13/2020 CLINICAL DATA:  Cranial neuropathy.  Extremity weakness EXAM: CT HEAD WITHOUT CONTRAST TECHNIQUE: Contiguous axial images were obtained from the base of the skull through the vertex without intravenous contrast. COMPARISON:  09/23/2020 FINDINGS: Brain: No evidence of acute infarction, hemorrhage, extra-axial collection or mass lesion/mass effect. Scattered low-density changes within the periventricular and subcortical white matter compatible with chronic microvascular ischemic change. Mild diffuse cerebral volume loss. Similar size and configuration of the ventricles which remain mildly enlarged. Vascular: Atherosclerotic calcifications involving the large vessels of the skull  base. No unexpected hyperdense vessel. Skull: Normal. Negative for fracture or focal lesion. Sinuses/Orbits: No acute finding. Other: None. IMPRESSION: 1. No acute intracranial findings.  Stable exam from 09/23/2020. 2. Chronic microvascular ischemic change and cerebral volume loss. Electronically Signed   By: Davina Poke D.O.   On: 10/13/2020 16:51   DG CHEST PORT 1 VIEW  Result Date: 10/13/2020 CLINICAL DATA:  Lethargy EXAM: PORTABLE CHEST 1 VIEW COMPARISON:  09/23/2020 FINDINGS: The heart size and mediastinal contours are within normal limits. Both lungs are clear. The visualized skeletal structures are unremarkable. IMPRESSION: No active disease. Electronically Signed   By: Inez Catalina M.D.   On: 10/13/2020 20:37     EKG: Independently reviewed.  Normal sinus rhythm  Assessment/Plan Principal Problem:   Acute encephalopathy Active Problems:   COPD (chronic obstructive pulmonary disease) (HCC)   DM II (diabetes mellitus, type II), controlled (HCC)   HTN (hypertension)   Infection of lumbar spine (HCC)   AKI (acute kidney injury) ([place])  Acute encephalopathy - Work-up in ED largely unremarkable with normal electrolytes, normal liver function other than mildly elevated ALP, normal BUN, no acidosis on ABG, only mildly elevated PCO2. - Differential includes medication side effect due to multiple centrally acting medications, recurrence of infection given somewhat similar presentation in August, hypoxia from failure to use home BiPAP (this seems less likely as her PCO2 was okay and her pulse ox is also within normal limits). - No focal neurologic deficit on exam - We will hold centrally acting medications including baclofen, lamotrigine, pramipexole, pregabalin, trazodone, Robaxin. We will also hold home oxycodone until the morning. - Check CRP for signs of recurrent infection, and check TSH - Also awaiting results of UA and will get chest x-ray - We will recheck ABG while on BiPAP to ensure PO2 has normalized - AM CMP and CBC  AKI - Creatinine elevated to 1.2 (from baseline 0.9), mild AKI - We will hold her metolazone - Recheck labs in the a.m.  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 - Has completed 4-week course of prophylactic doxycycline -  We will consider reimaging if patient does not improve, or elevated inflammatory markers  Diabetes - Patient is taking 70/30 insulin 60 units twice daily at home. She is also taking Synjardy, and pioglitazone. - We will start reduced dose of 70/30 insulin at 40 units twice daily, with sliding scale insulin - Hold p.o. meds  Depression - Holding centrally acting medications as above  DVT prophylaxis: Lovenox  Family Communication:  Husband updated at bedside  Disposition Plan:   Patient is from:  Home  Anticipated DC to:  Home  Anticipated DC date:  Pending clinical course    Consults called:  None  Admission status:  Observation, progressive  Severity of Illness: The appropriate patient status for this patient is OBSERVATION. Observation status is judged to be reasonable and necessary in order to provide the required intensity of service to ensure the patient's safety. The patient's presenting symptoms, physical exam findings, and initial radiographic and laboratory data in  the context of their medical condition is felt to place them at decreased risk for further clinical deterioration. Furthermore, it is anticipated that the patient will be medically stable for discharge from the hospital within 2 midnights of admission. The following factors support the patient status of observation.   " The patient's presenting symptoms include lethargy, confusion, weakness. " The physical exam findings include weakness, confusion. " The initial radiographic and laboratory data are largely stable does show mild AKI.[name]   Marcelyn Bruins MD [facility]  How to contact the [facility] Attending or Consulting provider [place] or covering provider during after hours [place], for this patient?   1. Check the care team in [facility] and look for a) attending/consulting [facility] provider listed and b) the [facility] team listed 2. Log into [email or web address] and use [place]'s universal password to access. If you do not have  the password, please contact the hospital operator. 3. Locate the [facility]  provider you are looking for under [facility] and page to a number that you can be directly reached. 4. If you still have difficulty reaching the provider, please page the [facility] (Director on Call) for the Hospitalists listed on amion for assistance.  10/14/2020, 12:11 AM

## 2020-10-13 NOTE — ED Triage Notes (Signed)
Pt brought to ED by family for further evaluation for AMS, unsteady gait and incontinent that started today at 6 am. Last seen well per family yesterday at 2100.

## 2020-10-13 NOTE — ED Provider Notes (Signed)
[facility] EMERGENCY DEPARTMENT Provider Note   CSN: [id number] Arrival date & time: 10/13/20  1438     History Chief Complaint  Patient presents with  . Extremity Weakness    Brenda Morgan is a 64 y.o. female.  Patient with generalized weakness since last pm. Hx copd, htn, uses bipap at night. States symptoms acute onset, moderate, persistent. Last at baseline, last evening, sometime before 8 pm. Is supposed to use bipap at night, ?whether used last night. 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 Pt indicates all day today has felt generally weak and sleepy. Denies new meds use, states takes oxycodone for back pain, and last took last evening. Spouse states he lays out pts meds, and so does not believe could have taken any extra meds. No acute trauma or fall (did fall a few weeks ago). Denies syncope. No chest pain or discomfort. No sob. No abd pain or nvd. No dysuria or gu c/o. No rash/skin lesions. No fever or chills. No melena, or rectal bleeding. Denies blood loss of any sort. Spouse notes similar symptoms in past a couple times - states one time was told ammonia level high, and another time that co2 level was high.   The history is provided by the patient and the spouse.  Extremity Weakness Pertinent negatives include no chest pain, no abdominal pain, no headaches and no shortness of breath.       Past Medical History:  Diagnosis Date  . COPD (chronic obstructive pulmonary disease) (HCC)   . Diabetes mellitus without complication (HCC)   . Fatty liver 2012   per pt 06/24/20  . Sleep apnea     Patient Active Problem List   Diagnosis Date Noted  . AKI (acute kidney injury) (HCC) 09/15/2020  . Infection of lumbar spine (HCC) 08/02/2020  . Hardware complicating wound infection (HCC) 08/02/2020  . COPD (chronic obstructive pulmonary disease) (HCC) 08/01/2020  . DM II (diabetes mellitus, type II), controlled (HCC) 08/01/2020  . HTN  (hypertension) 08/01/2020  . Postoperative wound infection 07/31/2020  . Postoperative back pain 07/06/2020  . Lumbar radiculopathy 07/06/2020  . Lumbar spinal stenosis 06/28/2020    Past Surgical History:  Procedure Laterality Date  . BACK SURGERY    . carotid artery surgery Right 10/07/2012  . carotid ultrasound  06/2015  . CHOLECYSTECTOMY  1981  . COLONOSCOPY  2014  . DIAGNOSTIC MAMMOGRAM  03/2017  . groin lypnoid Left 03/2015  . LUMBAR WOUND DEBRIDEMENT N/A 08/02/2020   Procedure: IRRIGATE AND DEBRIDEMENT OF LUMBAR WOUND, PLACEMENT OF WOUND VAC;  Surgeon: Dawley, Alan Mulder, DO;  Location: [facility] OR;  Service: Neurosurgery;  Laterality: N/A;  . right heel spur  1994  . TUBAL LIGATION  1985  . wrist trigger release Right 01/2016     OB History   No obstetric history on file.     No family history on file.  Social History   Tobacco Use  . Smoking status: Current Every Day Smoker    Packs/day: 1.00    Types: Cigarettes  . Smokeless tobacco: Never Used  Substance Use Topics  . Alcohol use: Not Currently  . Drug use: Not Currently    Home Medications Prior to Admission medications   Medication Sig Start Date End Date Taking? Authorizing Provider  atorvastatin (LIPITOR) 10 MG tablet Take 5 mg by mouth daily.   Yes [provider]  azelastine (ASTELIN) 0.1 % nasal spray Place 2 sprays into both nostrils 2 (two)  times daily as needed for rhinitis (congestion).  07/30/20  Yes [provider]  baclofen (LIORESAL) 20 MG tablet Take 20 mg by mouth at bedtime.    Yes [provider]  carvedilol (COREG) 25 MG tablet Take 25 mg by mouth at bedtime.    Yes [provider]  cetirizine (ZYRTEC) 10 MG tablet Take 10 mg by mouth daily.   Yes [provider]  Cholecalciferol (VITAMIN D) 50 MCG (2000 UT) tablet Take 2,000 Units by mouth daily.   Yes [provider]  D3-50 1.25 MG (50000 UT) capsule Take 50,000 Units by mouth once a week.  Sunday 08/09/20  Yes [provider]  diclofenac Sodium (VOLTAREN) 1 % GEL Apply 4 g topically 4 (four) times daily. Patient taking differently: Apply 4 g topically 4 (four) times daily as needed (pain).  09/23/20  Yes Melene Plan, DO  Empagliflozin-metFORMIN HCl ER (SYNJARDY XR) 25-1000 MG TB24 Take 1 tablet by mouth daily.   Yes [provider]  famotidine (PEPCID) 40 MG tablet Take 40 mg by mouth daily.  09/09/20  Yes [provider]  ferrous sulfate 325 (65 FE) MG tablet Take 325 mg by mouth daily with breakfast.   Yes [provider]  Fluticasone-Salmeterol (ADVAIR) 250-50 MCG/DOSE AEPB Inhale 1 puff into the lungs 2 (two) times daily as needed (shortness of breath/wheezing).    Yes [provider]  hydrOXYzine (ATARAX/VISTARIL) 25 MG tablet Take 12.5 mg by mouth at bedtime. 07/30/20  Yes [provider]  icosapent Ethyl (VASCEPA) 1 g capsule Take 1 g by mouth 2 (two) times daily.   Yes [provider]  ipratropium-albuterol (DUONEB) 0.5-2.5 (3) MG/3ML SOLN Take 3 mLs by nebulization every 6 (six) hours as needed (shortness of breath/wheezing/asthma).    Yes [provider]  lactulose (CHRONULAC) 10 GM/15ML solution Take 10 g by mouth 3 (three) times daily as needed (constipation).    Yes [provider]  lamoTRIgine (LAMICTAL) 150 MG tablet Take 150 mg by mouth See admin instructions. Take one tablet (150 mg) by mouth every morning with a 25 mg tablet for a total dose of 175 mg daily   Yes [provider]  lamoTRIgine (LAMICTAL) 25 MG tablet Take 25 mg by mouth See admin instructions. Take one tablet (25 mg) by mouth every morning with a 150 mg tablet for a total dose of 175 mg daily 07/21/20  Yes [provider]  losartan (COZAAR) 25 MG tablet Take 25 mg by mouth daily. 09/28/20  Yes [provider]  magnesium oxide (MAG-OX) 400 MG tablet Take 400 mg by mouth daily.   Yes [provider]    metolazone (ZAROXOLYN) 5 MG tablet Take 5 mg by mouth every Monday, Wednesday, and Friday.    Yes [provider]  NOVOLOG MIX 70/30 FLEXPEN (70-30) 100 UNIT/ML FlexPen Inject 60 Units into the skin 2 (two) times daily with a meal.  10/10/20  Yes [provider]  oxyCODONE (OXY IR/ROXICODONE) 5 MG immediate release tablet Take 5 mg by mouth 4 (four) times daily as needed for moderate pain.  08/08/20  Yes [provider]  OXYGEN Inhale 3 L into the lungs at bedtime. Use with BIPAP   Yes [provider]  OZEMPIC, 1 MG/DOSE, 4 MG/3ML SOPN Inject 1 mg into the skin once a week. Take on Sunday 09/14/20  Yes [provider]  pantoprazole (PROTONIX) 40 MG tablet Take 40 mg by mouth daily.   Yes  [provider]  pioglitazone (ACTOS) 15 MG tablet Take 15 mg by mouth daily.  08/19/20  Yes [provider]  potassium chloride SA (KLOR-CON) 20 MEQ tablet Take 20 mEq by mouth daily.   Yes [provider]  pramipexole (MIRAPEX) 0.5 MG tablet Take 1 mg by mouth at bedtime.   Yes [provider]  pregabalin (LYRICA) 200 MG capsule Take 200 mg by mouth in the morning, at noon, and at bedtime.   Yes [provider]  silver sulfADIAZINE (SILVADENE) 1 % cream Apply 1 application topically daily as needed (leg sores).    Yes [provider]  spironolactone (ALDACTONE) 50 MG tablet Take 50 mg by mouth daily.   Yes [provider]  torsemide (DEMADEX) 20 MG tablet Take 20 mg by mouth daily.  08/12/20  Yes [provider]  traZODone (DESYREL) 100 MG tablet Take 100 mg by mouth at bedtime.  07/31/20  Yes [provider]  doxycycline (VIBRA-TABS) 100 MG tablet Take 1 tablet (100 mg total) by mouth 2 (two) times daily. Patient not taking: Reported on 10/13/2020 09/15/20   Blanchard Kelch, NP  methocarbamol (ROBAXIN-750) 750 MG tablet Take 1 tablet (750 mg total) by mouth 3 (three) times daily as needed for  muscle spasms. Patient not taking: Reported on 09/23/2020 06/30/20   Alyson Ingles, PA-C  morphine (MSIR) 15 MG tablet Take 0.5 tablets (7.5 mg total) by mouth every 4 (four) hours as needed for severe pain. Patient not taking: Reported on 10/13/2020 09/23/20   Melene Plan, DO  rifabutin [facility]) 150 MG capsule Take 2 capsules (300 mg total) by mouth daily. 08/02/20   Cipriano Bunker, MD    Allergies    Amlodipine, Naltrexone, and Penicillins  Review of Systems   Review of Systems  Constitutional: Negative for chills and fever.  HENT: Negative for sore throat and trouble swallowing.   Eyes: Negative for redness and visual disturbance.  Respiratory: Negative for cough and shortness of breath.   Cardiovascular: Negative for chest pain.  Gastrointestinal: Negative for abdominal pain, blood in stool, diarrhea and vomiting.  Endocrine: Negative for polyuria.  Genitourinary: Negative for dysuria, flank pain and vaginal bleeding.  Musculoskeletal: Positive for extremity weakness. Negative for back pain and neck pain.  Skin: Negative for rash.  Neurological: Negative for dizziness, speech difficulty, numbness and headaches.  Hematological: Does not bruise/bleed easily.  Psychiatric/Behavioral: Negative for confusion.    Physical Exam Updated Vital Signs BP 123/83 (BP Location: Right Arm)   Pulse 71   Temp 98.1 F (36.7 C) (Oral)   Resp 14   Ht 1.676 m (5\' 6" )   Wt 87.7 kg   SpO2 94%   BMI 31.21 kg/m   Physical Exam Vitals and nursing note reviewed.  Constitutional:      Appearance: Normal appearance. She is well-developed.  HENT:     Head: Atraumatic.     Nose: Nose normal.     Mouth/Throat:     Mouth: Mucous membranes are moist.  Eyes:     General: No scleral icterus.    Extraocular Movements: Extraocular movements intact.     Conjunctiva/sclera: Conjunctivae normal.     Pupils: Pupils are equal, round, and reactive to light.  Neck:     Vascular: No carotid bruit.      Trachea: No tracheal deviation.     Comments: Thyroid not grossly enlarged or tender. No neck mass or l/a.  Cardiovascular:     Rate and  Rhythm: Normal rate and regular rhythm.     Pulses: Normal pulses.     Heart sounds: Normal heart sounds. No murmur heard.  No friction rub. No gallop.   Pulmonary:     Effort: Pulmonary effort is normal. No respiratory distress.     Breath sounds: Normal breath sounds.  Abdominal:     General: Bowel sounds are normal. There is no distension.     Palpations: Abdomen is soft. There is no mass.     Tenderness: There is no abdominal tenderness. There is no guarding or rebound.     Hernia: No hernia is present.  Genitourinary:    Comments: No cva tenderness.  Musculoskeletal:        General: No swelling or tenderness.     Cervical back: Normal range of motion and neck supple. No rigidity. No muscular tenderness.     Right lower leg: No edema.     Left lower leg: No edema.  Skin:    General: Skin is warm and dry.     Findings: No rash.  Neurological:     Mental Status: She is alert.     Comments: Alert, speech normal, fluent. No aphasia or dysarthria. Motor/sens grossly intact bil.   Psychiatric:        Mood and Affect: Mood normal.     ED Results / Procedures / Treatments   Labs (all labs ordered are listed, but only abnormal results are displayed) Results for orders placed or performed during the hospital encounter of 10/13/20  Protime-INR  Result Value Ref Range   Prothrombin Time 12.7 11.4 - 15.2 seconds   INR 1.0 0.8 - 1.2  APTT  Result Value Ref Range   aPTT 31 24 - 36 seconds  CBC  Result Value Ref Range   WBC 12.6 (H) 4.0 - 10.5 K/uL   RBC 4.39 3.87 - 5.11 MIL/uL   Hemoglobin 12.6 12.0 - 15.0 g/dL   HCT 79.0 36 - 46 %   MCV 93.2 80.0 - 100.0 fL   MCH 28.7 26.0 - 34.0 pg   MCHC 30.8 30.0 - 36.0 g/dL   RDW 24.0 97.3 - 53.2 %   Platelets 216 150 - 400 K/uL   nRBC 0.0 0.0 - 0.2 %  Differential  Result Value Ref Range    Neutrophils Relative % 71 %   Neutro Abs 9.0 (H) 1.7 - 7.7 K/uL   Lymphocytes Relative 20 %   Lymphs Abs 2.5 0.7 - 4.0 K/uL   Monocytes Relative 6 %   Monocytes Absolute 0.7 0.1 - 1.0 K/uL   Eosinophils Relative 2 %   Eosinophils Absolute 0.2 0.0 - 0.5 K/uL   Basophils Relative 0 %   Basophils Absolute 0.0 0.0 - 0.1 K/uL   Immature Granulocytes 1 %   Abs Immature Granulocytes 0.08 (H) 0.00 - 0.07 K/uL  Comprehensive metabolic panel  Result Value Ref Range   Sodium 141 135 - 145 mmol/L   Potassium 3.9 3.5 - 5.1 mmol/L   Chloride 98 98 - 111 mmol/L   CO2 31 22 - 32 mmol/L   Glucose, Bld 167 (H) 70 - 99 mg/dL   BUN 15 8 - 23 mg/dL   Creatinine, Ser 9.92 (H) 0.44 - 1.00 mg/dL   Calcium 9.6 8.9 - 42.6 mg/dL   Total Protein 6.9 6.5 - 8.1 g/dL   Albumin 3.3 (L) 3.5 - 5.0 g/dL   AST 15 15 - 41 U/L   ALT 21 0 -  44 U/L   Alkaline Phosphatase 176 (H) 38 - 126 U/L   Total Bilirubin 0.8 0.3 - 1.2 mg/dL   GFR, Estimated 51 (L) >60 mL/min   Anion gap 12 5 - 15  Ammonia  Result Value Ref Range   Ammonia 17 9 - 35 umol/L  I-stat chem 8, ED  Result Value Ref Range   Sodium 140 135 - 145 mmol/L   Potassium 4.2 3.5 - 5.1 mmol/L   Chloride 99 98 - 111 mmol/L   BUN 21 8 - 23 mg/dL   Creatinine, Ser 7.821.10 (H) 0.44 - 1.00 mg/dL   Glucose, Bld 956161 (H) 70 - 99 mg/dL   Calcium, Ion 2.131.17 0.861.15 - 1.40 mmol/L   TCO2 35 (H) 22 - 32 mmol/L   Hemoglobin 13.3 12.0 - 15.0 g/dL   HCT 57.839.0 36 - 46 %  I-Stat arterial blood gas, ED  Result Value Ref Range   pH, Arterial 7.441 7.35 - 7.45   pCO2 arterial 51.2 (H) 32 - 48 mmHg   pO2, Arterial 59 (L) 83 - 108 mmHg   Bicarbonate 35.0 (H) 20.0 - 28.0 mmol/L   TCO2 37 (H) 22 - 32 mmol/L   O2 Saturation 91.0 %   Acid-Base Excess 9.0 (H) 0.0 - 2.0 mmol/L   Sodium 143 135 - 145 mmol/L   Potassium 3.7 3.5 - 5.1 mmol/L   Calcium, Ion 1.21 1.15 - 1.40 mmol/L   HCT 36.0 36 - 46 %   Hemoglobin 12.2 12.0 - 15.0 g/dL   Patient temperature 46.998.1 F    Collection  site Radial    Drawn by Operator    Sample type ARTERIAL    DG Thoracic Spine 2 View  Result Date: 09/23/2020 CLINICAL DATA:  Tenderness after fall this morning. EXAM: THORACIC SPINE 2 VIEWS COMPARISON:  Cervical and lumbar spine CT earlier today. FINDINGS: T12 compression fracture better appreciated on lumbar spine CT earlier today. The mild T2 compression fracture on cervical spine CT earlier today is obscured by osseous and soft tissue overlap. There is no evidence of acute fracture of the midthoracic spine. Diffuse disc space narrowing and endplate spurring. No acute fracture of included ribs. No paravertebral soft tissue abnormality to suggest acute mid thoracic fracture. IMPRESSION: 1. Mild T2 compression fracture seen on cervical spine CT earlier today is obscured by osseous and soft tissue overlap. 2. The T12 fracture on lumbar spine CT earlier today is not well appreciated radiographically. 3. No evidence of midthoracic spine fracture. Electronically Signed   By: Narda RutherfordMelanie  Sanford M.D.   On: 09/23/2020 17:56   DG Pelvis 1-2 Views  Result Date: 09/23/2020 CLINICAL DATA:  Larey SeatFell.  Pelvic pain.  Recent lumbar fusion. EXAM: PELVIS - 1-2 VIEW COMPARISON:  None. FINDINGS: Both hips are normally located. No hip fractures are identified. The pubic symphysis and SI joints are intact. No definite pelvic fractures or bone lesions. Lumbar fusion hardware noted. IMPRESSION: No acute bony findings. Electronically Signed   By: Rudie MeyerP.  Gallerani M.D.   On: 09/23/2020 14:22   CT HEAD WO CONTRAST  Result Date: 10/13/2020 CLINICAL DATA:  Cranial neuropathy.  Extremity weakness EXAM: CT HEAD WITHOUT CONTRAST TECHNIQUE: Contiguous axial images were obtained from the base of the skull through the vertex without intravenous contrast. COMPARISON:  09/23/2020 FINDINGS: Brain: No evidence of acute infarction, hemorrhage, extra-axial collection or mass lesion/mass effect. Scattered low-density changes within the  periventricular and subcortical white matter compatible with chronic microvascular ischemic change. Mild diffuse cerebral  volume loss. Similar size and configuration of the ventricles which remain mildly enlarged. Vascular: Atherosclerotic calcifications involving the large vessels of the skull base. No unexpected hyperdense vessel. Skull: Normal. Negative for fracture or focal lesion. Sinuses/Orbits: No acute finding. Other: None. IMPRESSION: 1. No acute intracranial findings.  Stable exam from 09/23/2020. 2. Chronic microvascular ischemic change and cerebral volume loss. Electronically Signed   By: Duanne Guess D.O.   On: 10/13/2020 16:51   CT Head Wo Contrast  Result Date: 09/23/2020 CLINICAL DATA:  Head trauma, abnormal mental status (Age 55-64y) EXAM: CT HEAD WITHOUT CONTRAST TECHNIQUE: Contiguous axial images were obtained from the base of the skull through the vertex without intravenous contrast. COMPARISON:  None. FINDINGS: Brain: Mild cerebral atrophy. Ventricular prominence likely due to central atrophy. No intracranial hemorrhage, mass effect, or midline shift. The basilar cisterns are patent. Periventricular white matter changes typical of chronic small vessel ischemia. There is a remote lacunar infarct in the right basal ganglia. No evidence of territorial infarct or acute ischemia. No extra-axial or intracranial fluid collection. Vascular: Atherosclerosis of skullbase vasculature without hyperdense vessel or abnormal calcification. Skull: No fracture or focal lesion. Sinuses/Orbits: Paranasal sinuses and mastoid air cells are clear. The visualized orbits are unremarkable. Other: None. IMPRESSION: 1. No acute intracranial abnormality. No skull fracture. 2. Mild atrophy and chronic small vessel ischemia. Ventricular prominence likely due to central atrophy. Remote lacunar infarct in the right basal ganglia. Electronically Signed   By: Narda Rutherford M.D.   On: 09/23/2020 16:30   CT Cervical  Spine Wo Contrast  Result Date: 09/23/2020 CLINICAL DATA:  64 year old female status post spine fusion in July. Fall this morning with increased pain in the back and legs. Midline tenderness. EXAM: CT CERVICAL SPINE WITHOUT CONTRAST TECHNIQUE: Multidetector CT imaging of the cervical spine was performed without intravenous contrast. Multiplanar CT image reconstructions were also generated. COMPARISON:  Head CT today reported separately. FINDINGS: Alignment: Relatively preserved cervical lordosis. Cervicothoracic junction alignment is within normal limits. Bilateral posterior element alignment is within normal limits. Skull base and vertebrae: Mild motion artifact in the lower cervical spine, most pronounced at C7. Visualized skull base is intact. No atlanto-occipital dissociation. C1 and C2 appear intact and normally aligned. No acute osseous abnormality identified. Soft tissues and spinal canal: No prevertebral fluid or swelling. No visible canal hematoma. Surgical clips along the anterior right carotid space in the upper neck. Superimposed calcified carotid atherosclerosis. There also appeared to be tiny surgical clips along the superior right thyroid. No obvious neck mass or lymphadenopathy. Disc levels:  Mild for age cervical spine degeneration. Upper chest: Mild motion artifact. Subtle chronic appearing T2 superior endplate compression. Otherwise grossly intact visible upper thoracic levels. Negative lung apices, noncontrast visible superior mediastinum. Other: Negative visible posterior fossa. IMPRESSION: 1. No acute traumatic injury identified in the cervical spine. 2. Mild for age cervical spine degeneration. 3. Subtle chronic appearing T2 superior endplate compression fracture. 4. Multiple surgical clips in the right neck along the anterior carotid space. Electronically Signed   By: Odessa Fleming M.D.   On: 09/23/2020 16:34   CT Lumbar Spine Wo Contrast  Result Date: 09/23/2020 CLINICAL DATA:  64 year old  female status post spine fusion in July. Fall this morning with increased pain in the back and legs. EXAM: CT LUMBAR SPINE WITHOUT CONTRAST TECHNIQUE: Multidetector CT imaging of the lumbar spine was performed without intravenous contrast administration. Multiplanar CT image reconstructions were also generated. COMPARISON:  Postoperative lumbar radiographs 09/07/2020. Postoperative lumbar  MRI 07/31/2020. Preoperative MRI 05/18/2020. FINDINGS: Segmentation: Transitional. Same numbering system used as on the previous MRIs designating a partially lumbarized S1 level with vestigial S1-S2 disc space. By this designation a prior surgery is at L4-L5 and the lowest ribs are at T12. Correlation with radiographs is recommended prior to any operative intervention. Alignment: Mild stable lumbar lordosis residual anterolisthesis since August with of L4 on L5. Vertebrae: Postoperative details are below. Mild superior endplate compression of T12 is new since August, and was not apparent on the radiographs last month (series 9, image 41 and coronal image 41). Elsewhere the T12 vertebra remains intact. There is a subacute to chronic appearing fracture of the right posterior 12th rib (series 5, image 38). L1 through L3 vertebrae appear intact with chronic fractures of the right L2 through L4 transverse processes (series 5, image 62 at the L3 level). Visible sacrum and SI joints appear intact. Right L5-S1 assimilation joint noted. Paraspinal and other soft tissues: Absent gallbladder. Mild Aortoiliac calcified atherosclerosis. Negative visible other noncontrast abdominal viscera. Postoperative changes to the posterior paraspinal soft tissues. Disc levels: The visible lower thoracic levels through L2-L3 appear negative. L3-L4: Mild circumferential disc bulge. Moderate posterior element hypertrophy appears increased from the preoperative MRI. Mild if any associated spinal stenosis. Mild to moderate left L3 foraminal stenosis has  increased also. L4-L5: Sequelae of decompression and fusion. Bilateral pedicle screws with mildly medial position of both screws at L4 (series 5, image 76). Mild superior L4 endplate deformity is probably stable from the August MRI. Subsidence of the interbody implant into the L5 vertebra. New or increasing inferior L5 compression deformity since August is associated with inferior endplate fracture on the right (series 8, image 37) and lucency along the right L5 pedicle screw (image 35). The L5 posterior elements appear to remain intact. L5-S1: New vacuum disc. Moderate posterior element hypertrophy greater on the left is chronic. Chronic L5 neural foraminal stenosis has not definitely changed. IMPRESSION: 1. Compression fractures of both the T12 superior endplate and the L5 inferior endplate appear new or increased since an August MRI. And the latter is associated with lucency along the right L5 pedicle screw suspicious for hardware loosening. 2. Superimposed subsidence of the interbody implant into the L5 body, and mild compression of the L4 superior endplate appear not significantly changed since August. 3. No other acute osseous abnormality identified. Chronic appearing fractures of the right 12th rib, and the right L2 through L4 transverse processes. 4. Adjacent segment disease at L3-L4 with mild new spinal and left L3 foraminal stenosis suspected since May. 5.  Aortic Atherosclerosis (ICD10-I70.0). Electronically Signed   By: Odessa Fleming M.D.   On: 09/23/2020 16:46   DG Chest Port 1 View  Result Date: 09/23/2020 CLINICAL DATA:  Back pain after fall EXAM: PORTABLE CHEST 1 VIEW COMPARISON:  07/31/2020 FINDINGS: Patient is rotated. The heart size and mediastinal contours appear within normal limits. Mildly prominent interstitial markings within the periphery of the right lung base. No pleural effusion or pneumothorax. The visualized skeletal structures are unremarkable. IMPRESSION: Mildly prominent interstitial  markings within the periphery of the right lung base, which may reflect bronchitic type lung changes, atelectasis, or less likely infiltrate. Overall, little change from the prior study. Electronically Signed   By: Duanne Guess D.O.   On: 09/23/2020 13:42   DG FEMUR MIN 2 VIEWS LEFT  Result Date: 09/23/2020 CLINICAL DATA:  Pt reports spinal fusion in July, states she fell this morning onto her back, has  increased pain in back and legs since. EXAM: LEFT FEMUR 2 VIEWS COMPARISON:  None. FINDINGS: Hip is located. No femoral neck fracture. Degenerative narrowing in the medial compartment of the LEFT knee. IMPRESSION: No acute findings Electronically Signed   By: Genevive Bi M.D.   On: 09/23/2020 14:23    EKG EKG Interpretation  Date/Time:  Thursday October 13 2020 16:12:35 EDT Ventricular Rate:  71 PR Interval:    QRS Duration: 87 QT Interval:  420 QTC Calculation: 457 R Axis:   56 Text Interpretation: Sinus rhythm No significant change since last tracing Confirmed by Cathren Laine ([id number]) on 10/13/2020 6:33:46 PM   Radiology CT HEAD WO CONTRAST  Result Date: 10/13/2020 CLINICAL DATA:  Cranial neuropathy.  Extremity weakness EXAM: CT HEAD WITHOUT CONTRAST TECHNIQUE: Contiguous axial images were obtained from the base of the skull through the vertex without intravenous contrast. COMPARISON:  09/23/2020 FINDINGS: Brain: No evidence of acute infarction, hemorrhage, extra-axial collection or mass lesion/mass effect. Scattered low-density changes within the periventricular and subcortical white matter compatible with chronic microvascular ischemic change. Mild diffuse cerebral volume loss. Similar size and configuration of the ventricles which remain mildly enlarged. Vascular: Atherosclerotic calcifications involving the large vessels of the skull base. No unexpected hyperdense vessel. Skull: Normal. Negative for fracture or focal lesion. Sinuses/Orbits: No acute finding. Other: None.  IMPRESSION: 1. No acute intracranial findings.  Stable exam from 09/23/2020. 2. Chronic microvascular ischemic change and cerebral volume loss. Electronically Signed   By: Duanne Guess D.O.   On: 10/13/2020 16:51    Procedures Procedures (including critical care time)  Medications Ordered in ED Medications - No data to display  ED Course  I have reviewed the triage vital signs and the nursing notes.  Pertinent labs & imaging results that were available during my care of the patient were reviewed by me and considered in my medical decision making (see chart for details).    MDM Rules/Calculators/A&P                          Iv ns. Stat labs. Continuous pulse ox and cardiac monitoring. Imaging.  Reviewed nursing notes and prior charts for additional history.   Labs reviewed/interpreted by me - chem normal. Ammonia normal. On abg, co2 mildly elevated.   CT reviewed/interpreted by me - no hem.  Pt does appear drowsy, generally tired, easily falls asleep, and easily aroused.  Recheck, spouse indicates pt still altered compared to baseline - cause not entirely clear, ?mild elevated co2, and meds contributed. As not at baseline, will admit to medicine w non-specific encephalopathy.      Final Clinical Impression(s) / ED Diagnoses Final diagnoses:  None    Rx / DC Orders ED Discharge Orders    None       Cathren Laine, MD 10/13/20 [phone]

## 2020-10-14 DIAGNOSIS — G9340 Encephalopathy, unspecified: Secondary | ICD-10-CM | POA: Diagnosis not present

## 2020-10-14 LAB — COMPREHENSIVE METABOLIC PANEL
ALT: 19 U/L (ref 0–44)
AST: 14 U/L — ABNORMAL LOW (ref 15–41)
Albumin: 2.9 g/dL — ABNORMAL LOW (ref 3.5–5.0)
Alkaline Phosphatase: 161 U/L — ABNORMAL HIGH (ref 38–126)
Anion gap: 12 (ref 5–15)
BUN: 14 mg/dL (ref 8–23)
CO2: 30 mmol/L (ref 22–32)
Calcium: 9.1 mg/dL (ref 8.9–10.3)
Chloride: 99 mmol/L (ref 98–111)
Creatinine, Ser: 0.95 mg/dL (ref 0.44–1.00)
GFR, Estimated: >60 mL/min (ref >60)
Glucose, Bld: 141 mg/dL — ABNORMAL HIGH (ref 70–99)
Potassium: 3.8 mmol/L (ref 3.5–5.1)
Sodium: 141 mmol/L (ref 135–145)
Total Bilirubin: 0.6 mg/dL (ref 0.3–1.2)
Total Protein: 6.2 g/dL — ABNORMAL LOW (ref 6.5–8.1)

## 2020-10-14 LAB — CBG MONITORING, ED
Glucose-Capillary: 126 mg/dL — ABNORMAL HIGH (ref 70–99)
Glucose-Capillary: 132 mg/dL — ABNORMAL HIGH (ref 70–99)

## 2020-10-14 LAB — CBC
HCT: 36 % (ref 36.0–46.0)
Hemoglobin: 11.2 g/dL — ABNORMAL LOW (ref 12.0–15.0)
MCH: 29.1 pg (ref 26.0–34.0)
MCHC: 31.1 g/dL (ref 30.0–36.0)
MCV: 93.5 fL (ref 80.0–100.0)
Platelets: 175 10*3/uL (ref 150–400)
RBC: 3.85 MIL/uL — ABNORMAL LOW (ref 3.87–5.11)
RDW: 15.1 % (ref 11.5–15.5)
WBC: 8.5 10*3/uL (ref 4.0–10.5)
nRBC: 0 % (ref 0.0–0.2)

## 2020-10-14 LAB — C-REACTIVE PROTEIN: CRP: 3.3 mg/dL — ABNORMAL HIGH (ref <1.0)

## 2020-10-14 LAB — TSH: TSH: 1.493 u[IU]/mL (ref 0.350–4.500)

## 2020-10-14 LAB — MAGNESIUM: Magnesium: 1.8 mg/dL (ref 1.7–2.4)

## 2020-10-14 MED ORDER — ALUM & MAG HYDROXIDE-SIMETH 200-200-20 MG/5ML PO SUSP
30.0000 mL | Freq: Once | ORAL | Status: AC
  Administered 2020-10-14: 30 mL via ORAL
  Filled 2020-10-14: qty 30

## 2020-10-14 MED ORDER — OXYCODONE HCL 5 MG PO TABS: 5.0000 mg | ORAL_TABLET | Freq: Four times a day (QID) | ORAL | Status: DC | PRN

## 2020-10-14 MED ORDER — INSULIN ASPART PROT & ASPART (70-30 MIX) 100 UNIT/ML ~~LOC~~ SUSP
40.0000 [IU] | Freq: Two times a day (BID) | SUBCUTANEOUS | Status: DC
  Administered 2020-10-14: 40 [IU] via SUBCUTANEOUS
  Filled 2020-10-14: qty 10

## 2020-10-14 NOTE — ED Notes (Signed)
Report given to [place]- Med Tech - [employer]

## 2020-10-14 NOTE — ED Notes (Signed)
Patient verbalizes understanding of discharge instructions. Opportunity for questioning and answers were provided. Armband removed by staff, pt discharged from ED via wheelchair.  

## 2020-10-14 NOTE — ED Notes (Signed)
Spoke with Dr. Natale Milch on phone. Verified that patient could be discharged.

## 2020-10-14 NOTE — ED Notes (Signed)
Pt assisted to bedside commode

## 2020-10-14 NOTE — Discharge Summary (Signed)
Physician Discharge Summary  Brenda Morgan TKZ:[id number] DOB: September 19, 1956 DOA: 10/13/2020  PCP: [facility], [facility]  Admit date: 10/13/2020 Discharge date: 10/14/2020  Admitted From: Home Disposition: Home  Recommendations for Outpatient Follow-up:  1. Follow up with PCP in 1-2 weeks 2. 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 Patient was at baseline when she went to bed at 8 PM the night before. When her husband spoke to her at 54 AM she states that since she woke up at 6 AM she was feeling sleepy and generally weak. Patient reports ongoing neck and back pain that is unchanged;As well as weakness in her legs which is unchanged. She reports new upper extremity generalized weakness bilaterally. She denies fevers, cough, chest pain, shortness of breath, abdominal pain, nausea, constipation/diarrhea, visual changes. She has had a recent admission and treatment for lumbar spine infection. She presented with some altered mental status in August. She has been on suppressive doxycycline after finishing her outpatient IV antibiotics. Her doxycycline appears to be due to be stopped at this time. No change in her back pain. She additionally has history of OSA on BiPAP, but does not consistently use her BiPAP. She has not used it for the last 3 days at least. In ED. Labs significant for mildly elevated  creatinine at 1.2 from a baseline of 0.9. White count mildly elevated to 12.6. Mild alk phos elevation to 176. Electrolytes all within normal limits, PT PTT within normal limits, calcium within normal limits, ammonia within normal limits.ABG showed normal pH of 7.4 mild elevation in PCO2 at 51 and PO2 of 59 (however pulse ox within normal limits.).  Patient admitted as above with weakness and lethargy, mental status was A/O x4 since onset per husband at bedside with no episodes of confusion or loss of memory or recall per the patient.  Lengthy discussion at bedside today that patient does see multiple physicians in her hometown each of which apparently have been prescribing different CNS medications, given patient's lengthy home medication list we have discussed the high likelihood that she had likely suffered from polypharmacy.  Given lack of mental status changes, patient's physical exam is now benign, PT OT following with no further recommendations and marked and rapid improvement of multiple sedating nighttime medications we feel this is likely the cause of her acute changes.  Husband at bedside is concerned for removing so many medications at once, we discussed that the medications we recommended to hold should not have any withdrawal symptoms, but patient should follow with PCP as well as other prescribing physicians for safely weaning remaining medications if they choose to continue to wean her off of her narcotics, Lamictal as well as hydroxyzine.  Currently we recommended to stop baclofen, methocarbamol, metolazone and trazodone the majority of which she appears to be taking most notably at night to help with sleep.  At this time given negative imaging and no overt lab findings to explain patient's symptoms as well as resolution of symptoms holding these medications as above patient is otherwise stable and  agreeable for discharge home with close follow-up with PCP and other specialists as  scheduled.  Discharge Diagnoses:  Principal Problem:   Acute encephalopathy Active Problems:   COPD (chronic obstructive pulmonary disease) (HCC)   DM II (diabetes mellitus, type II), controlled (HCC)   HTN (hypertension)   Infection of lumbar spine (HCC)   AKI (acute kidney injury) [name])    Discharge Instructions  Discharge Instructions    Diet - low sodium heart healthy   Complete by: As directed    Increase activity slowly   Complete by: As directed    Increase activity slowly   Complete by: As directed      Allergies as of 10/14/2020      Reactions   Amlodipine Anaphylaxis, Swelling, Other (See Comments)   Tongue swelling    Naltrexone Hives   Penicillins Hives, Other (See Comments)   Tolerated ceftriaxone and cefepime 06/2017 Hives      Medication List    STOP taking these medications   baclofen 20 MG tablet Commonly known as: LIORESAL   methocarbamol 750 MG tablet Commonly known as: Robaxin-750   metolazone 5 MG tablet Commonly known as: ZAROXOLYN   traZODone 100 MG tablet Commonly known as: DESYREL     TAKE these medications   atorvastatin 10 MG tablet Commonly known as: LIPITOR Take 5 mg by mouth daily.   azelastine 0.1 % nasal spray Commonly known as: ASTELIN Place 2 sprays into both nostrils 2 (two) times daily as needed for rhinitis (congestion).   carvedilol 25 MG tablet Commonly known as: COREG Take 25 mg by mouth at bedtime.   cetirizine 10 MG tablet Commonly known as: ZYRTEC Take 10 mg by mouth daily.   diclofenac Sodium 1 % Gel Commonly known as: VOLTAREN Apply 4 g topically 4 (four) times daily. What changed:   when to take this  reasons to take this   doxycycline 100 MG tablet Commonly known as: VIBRA-TABS Take 1 tablet (100 mg total) by mouth 2 (two) times daily.   famotidine 40 MG tablet Commonly known as: PEPCID Take 40 mg by mouth daily.   ferrous sulfate 325 (65 FE) MG tablet Take 325 mg by mouth daily with  breakfast.   Fluticasone-Salmeterol 250-50 MCG/DOSE Aepb Commonly known as: ADVAIR Inhale 1 puff into the lungs 2 (two) times daily as needed (shortness of breath/wheezing).   hydrOXYzine 25 MG tablet Commonly known as: ATARAX/VISTARIL Take 12.5 mg by mouth at bedtime.   ipratropium-albuterol 0.5-2.5 (3) MG/3ML Soln Commonly known as: DUONEB Take 3 mLs by nebulization every 6 (six) hours as needed (shortness of breath/wheezing/asthma).   lactulose 10 GM/15ML solution Commonly known as: CHRONULAC Take 10 g by mouth 3 (three) times daily as needed (constipation).   lamoTRIgine 150 MG tablet Commonly known as: LAMICTAL Take 150 mg by mouth See admin instructions. Take one tablet (150 mg) by mouth every morning with a 25 mg tablet for a total dose of 175 mg daily   lamoTRIgine 25 MG tablet Commonly known as: LAMICTAL Take 25 mg by mouth See admin instructions. Take one tablet (25 mg) by mouth every morning with a 150 mg tablet for a total dose of 175 mg daily   losartan 25 MG tablet Commonly known as: COZAAR Take 25 mg by mouth daily.   magnesium oxide 400 MG tablet Commonly known as: MAG-OX Take 400 mg by mouth daily.   morphine 15 MG tablet Commonly known as: MSIR Take 0.5 tablets (7.5 mg total) by  mouth every 4 (four) hours as needed for severe pain.   NovoLOG Mix 70/30 FlexPen (70-30) 100 UNIT/ML FlexPen Generic drug: insulin aspart protamine - aspart Inject 60 Units into the skin 2 (two) times daily with a meal.   oxyCODONE 5 MG immediate release tablet Commonly known as: Oxy IR/ROXICODONE Take 5 mg by mouth 4 (four) times daily as needed for moderate pain.   OXYGEN Inhale 3 L into the lungs at bedtime. Use with BIPAP   Ozempic (1 MG/DOSE) 4 MG/3ML Sopn Generic drug: Semaglutide (1 MG/DOSE) Inject 1 mg into the skin once a week. Take on Sunday   pantoprazole 40 MG tablet Commonly known as: PROTONIX Take 40 mg by mouth daily.   pioglitazone 15 MG  tablet Commonly known as: ACTOS Take 15 mg by mouth daily.   potassium chloride SA 20 MEQ tablet Commonly known as: KLOR-CON Take 20 mEq by mouth daily.   pramipexole 0.5 MG tablet Commonly known as: MIRAPEX Take 1 mg by mouth at bedtime.   pregabalin 200 MG capsule Commonly known as: LYRICA Take 200 mg by mouth in the morning, at noon, and at bedtime.   rifabutin 150 MG capsule Commonly known as: MYCOBUTIN Take 2 capsules (300 mg total) by mouth daily.   silver sulfADIAZINE 1 % cream Commonly known as: SILVADENE Apply 1 application topically daily as needed (leg sores).   spironolactone 50 MG tablet Commonly known as: ALDACTONE Take 50 mg by mouth daily.   Synjardy XR 25-1000 MG Tb24 Generic drug: Empagliflozin-metFORMIN HCl ER Take 1 tablet by mouth daily.   torsemide 20 MG tablet Commonly known as: DEMADEX Take 20 mg by mouth daily.   Vascepa 1 g capsule Generic drug: icosapent Ethyl Take 1 g by mouth 2 (two) times daily.   Vitamin D 50 MCG (2000 UT) tablet Take 2,000 Units by mouth daily.   D3-50 1.25 MG (50000 UT) capsule Generic drug: Cholecalciferol Take 50,000 Units by mouth once a week. Sunday       Allergies  Allergen Reactions  . Amlodipine Anaphylaxis, Swelling and Other (See Comments)    Tongue swelling   . Naltrexone Hives  . Penicillins Hives and Other (See Comments)    Tolerated ceftriaxone and cefepime 06/2017 Hives      Consultations:  None   Procedures/Studies: DG Thoracic Spine 2 View  Result Date: 09/23/2020 CLINICAL DATA:  Tenderness after fall this morning. EXAM: THORACIC SPINE 2 VIEWS COMPARISON:  Cervical and lumbar spine CT earlier today. FINDINGS: T12 compression fracture better appreciated on lumbar spine CT earlier today. The mild T2 compression fracture on cervical spine CT earlier today is obscured by osseous and soft tissue overlap. There is no evidence of acute fracture of the midthoracic spine. Diffuse disc space  narrowing and endplate spurring. No acute fracture of included ribs. No paravertebral soft tissue abnormality to suggest acute mid thoracic fracture. IMPRESSION: 1. Mild T2 compression fracture seen on cervical spine CT earlier today is obscured by osseous and soft tissue overlap. 2. The T12 fracture on lumbar spine CT earlier today is not well appreciated radiographically. 3. No evidence of midthoracic spine fracture. Electronically Signed   By: Keith Rake M.D.   On: 09/23/2020 17:56   DG Pelvis 1-2 Views  Result Date: 09/23/2020 CLINICAL DATA:  [place].  Pelvic pain.  Recent lumbar fusion. EXAM: PELVIS - 1-2 VIEW COMPARISON:  None. FINDINGS: Both hips are normally located. No hip fractures are identified. The pubic symphysis and SI joints are intact.  No definite pelvic fractures or bone lesions. Lumbar fusion hardware noted. IMPRESSION: No acute bony findings. Electronically Signed   By: Marijo Sanes M.D.   On: 09/23/2020 14:22   CT HEAD WO CONTRAST  Result Date: 10/13/2020 CLINICAL DATA:  Cranial neuropathy.  Extremity weakness EXAM: CT HEAD WITHOUT CONTRAST TECHNIQUE: Contiguous axial images were obtained from the base of the skull through the vertex without intravenous contrast. COMPARISON:  09/23/2020 FINDINGS: Brain: No evidence of acute infarction, hemorrhage, extra-axial collection or mass lesion/mass effect. Scattered low-density changes within the periventricular and subcortical white matter compatible with chronic microvascular ischemic change. Mild diffuse cerebral volume loss. Similar size and configuration of the ventricles which remain mildly enlarged. Vascular: Atherosclerotic calcifications involving the large vessels of the skull base. No unexpected hyperdense vessel. Skull: Normal. Negative for fracture or focal lesion. Sinuses/Orbits: No acute finding. Other: None. IMPRESSION: 1. No acute intracranial findings.  Stable exam from 09/23/2020. 2. Chronic microvascular ischemic change  and cerebral volume loss. Electronically Signed   By: Davina Poke D.O.   On: 10/13/2020 16:51   CT Head Wo Contrast  Result Date: 09/23/2020 CLINICAL DATA:  Head trauma, abnormal mental status (Age 60-64y) EXAM: CT HEAD WITHOUT CONTRAST TECHNIQUE: Contiguous axial images were obtained from the base of the skull through the vertex without intravenous contrast. COMPARISON:  None. FINDINGS: Brain: Mild cerebral atrophy. Ventricular prominence likely due to central atrophy. No intracranial hemorrhage, mass effect, or midline shift. The basilar cisterns are patent. Periventricular white matter changes typical of chronic small vessel ischemia. There is a remote lacunar infarct in the right basal ganglia. No evidence of territorial infarct or acute ischemia. No extra-axial or intracranial fluid collection. Vascular: Atherosclerosis of skullbase vasculature without hyperdense vessel or abnormal calcification. Skull: No fracture or focal lesion. Sinuses/Orbits: Paranasal sinuses and mastoid air cells are clear. The visualized orbits are unremarkable. Other: None. IMPRESSION: 1. No acute intracranial abnormality. No skull fracture. 2. Mild atrophy and chronic small vessel ischemia. Ventricular prominence likely due to central atrophy. Remote lacunar infarct in the right basal ganglia. Electronically Signed   By: Keith Rake M.D.   On: 09/23/2020 16:30   CT Cervical Spine Wo Contrast  Result Date: 09/23/2020 CLINICAL DATA:  64 year old female status post spine fusion in July. Fall this morning with increased pain in the back and legs. Midline tenderness. EXAM: CT CERVICAL SPINE WITHOUT CONTRAST TECHNIQUE: Multidetector CT imaging of the cervical spine was performed without intravenous contrast. Multiplanar CT image reconstructions were also generated. COMPARISON:  Head CT today reported separately. FINDINGS: Alignment: Relatively preserved cervical lordosis. Cervicothoracic junction alignment is within normal  limits. Bilateral posterior element alignment is within normal limits. Skull base and vertebrae: Mild motion artifact in the lower cervical spine, most pronounced at C7. Visualized skull base is intact. No atlanto-occipital dissociation. C1 and C2 appear intact and normally aligned. No acute osseous abnormality identified. Soft tissues and spinal canal: No prevertebral fluid or swelling. No visible canal hematoma. Surgical clips along the anterior right carotid space in the upper neck. Superimposed calcified carotid atherosclerosis. There also appeared to be tiny surgical clips along the superior right thyroid. No obvious neck mass or lymphadenopathy. Disc levels:  Mild for age cervical spine degeneration. Upper chest: Mild motion artifact. Subtle chronic appearing T2 superior endplate compression. Otherwise grossly intact visible upper thoracic levels. Negative lung apices, noncontrast visible superior mediastinum. Other: Negative visible posterior fossa. IMPRESSION: 1. No acute traumatic injury identified in the cervical spine. 2. Mild for age  cervical spine degeneration. 3. Subtle chronic appearing T2 superior endplate compression fracture. 4. Multiple surgical clips in the right neck along the anterior carotid space. Electronically Signed   By: Genevie Ann M.D.   On: 09/23/2020 16:34   CT Lumbar Spine Wo Contrast  Result Date: 09/23/2020 CLINICAL DATA:  64 year old female status post spine fusion in July. Fall this morning with increased pain in the back and legs. EXAM: CT LUMBAR SPINE WITHOUT CONTRAST TECHNIQUE: Multidetector CT imaging of the lumbar spine was performed without intravenous contrast administration. Multiplanar CT image reconstructions were also generated. COMPARISON:  Postoperative lumbar radiographs 09/07/2020. Postoperative lumbar MRI 07/31/2020. Preoperative MRI 05/18/2020. FINDINGS: Segmentation: Transitional. Same numbering system used as on the previous MRIs designating a partially  lumbarized S1 level with vestigial S1-S2 disc space. By this designation a prior surgery is at L4-L5 and the lowest ribs are at T12. Correlation with radiographs is recommended prior to any operative intervention. Alignment: Mild stable lumbar lordosis residual anterolisthesis since August with of L4 on L5. Vertebrae: Postoperative details are below. Mild superior endplate compression of T12 is new since August, and was not apparent on the radiographs last month (series 9, image 41 and coronal image 41). Elsewhere the T12 vertebra remains intact. There is a subacute to chronic appearing fracture of the right posterior 12th rib (series 5, image 38). L1 through L3 vertebrae appear intact with chronic fractures of the right L2 through L4 transverse processes (series 5, image 62 at the L3 level). Visible sacrum and SI joints appear intact. Right L5-S1 assimilation joint noted. Paraspinal and other soft tissues: Absent gallbladder. Mild Aortoiliac calcified atherosclerosis. Negative visible other noncontrast abdominal viscera. Postoperative changes to the posterior paraspinal soft tissues. Disc levels: The visible lower thoracic levels through L2-L3 appear negative. L3-L4: Mild circumferential disc bulge. Moderate posterior element hypertrophy appears increased from the preoperative MRI. Mild if any associated spinal stenosis. Mild to moderate left L3 foraminal stenosis has increased also. L4-L5: Sequelae of decompression and fusion. Bilateral pedicle screws with mildly medial position of both screws at L4 (series 5, image 76). Mild superior L4 endplate deformity is probably stable from the August MRI. Subsidence of the interbody implant into the L5 vertebra. New or increasing inferior L5 compression deformity since August is associated with inferior endplate fracture on the right (series 8, image 37) and lucency along the right L5 pedicle screw (image 35). The L5 posterior elements appear to remain intact. L5-S1: New  vacuum disc. Moderate posterior element hypertrophy greater on the left is chronic. Chronic L5 neural foraminal stenosis has not definitely changed. IMPRESSION: 1. Compression fractures of both the T12 superior endplate and the L5 inferior endplate appear new or increased since an August MRI. And the latter is associated with lucency along the right L5 pedicle screw suspicious for hardware loosening. 2. Superimposed subsidence of the interbody implant into the L5 body, and mild compression of the L4 superior endplate appear not significantly changed since August. 3. No other acute osseous abnormality identified. Chronic appearing fractures of the right 12th rib, and the right L2 through L4 transverse processes. 4. Adjacent segment disease at L3-L4 with mild new spinal and left L3 foraminal stenosis suspected since May. 5.  Aortic Atherosclerosis (ICD10-I70.0). Electronically Signed   By: Genevie Ann M.D.   On: 09/23/2020 16:46   DG CHEST PORT 1 VIEW  Result Date: 10/13/2020 CLINICAL DATA:  Lethargy EXAM: PORTABLE CHEST 1 VIEW COMPARISON:  09/23/2020 FINDINGS: The heart size and mediastinal contours are within  normal limits. Both lungs are clear. The visualized skeletal structures are unremarkable. IMPRESSION: No active disease. Electronically Signed   By: Inez Catalina M.D.   On: 10/13/2020 20:37   DG Chest Port 1 View  Result Date: 09/23/2020 CLINICAL DATA:  Back pain after fall EXAM: PORTABLE CHEST 1 VIEW COMPARISON:  07/31/2020 FINDINGS: Patient is rotated. The heart size and mediastinal contours appear within normal limits. Mildly prominent interstitial markings within the periphery of the right lung base. No pleural effusion or pneumothorax. The visualized skeletal structures are unremarkable. IMPRESSION: Mildly prominent interstitial markings within the periphery of the right lung base, which may reflect bronchitic type lung changes, atelectasis, or less likely infiltrate. Overall, little change from the  prior study. Electronically Signed   By: Davina Poke D.O.   On: 09/23/2020 13:42   DG FEMUR MIN 2 VIEWS LEFT  Result Date: 09/23/2020 CLINICAL DATA:  Pt reports spinal fusion in July, states she fell this morning onto her back, has increased pain in back and legs since. EXAM: LEFT FEMUR 2 VIEWS COMPARISON:  None. FINDINGS: Hip is located. No femoral neck fracture. Degenerative narrowing in the medial compartment of the LEFT knee. IMPRESSION: No acute findings Electronically Signed   By: Suzy Bouchard M.D.   On: 09/23/2020 14:23     Subjective: No acute issues or events overnight denies nausea vomiting diarrhea constipation headache fevers chills memory loss, tremors, weakness, dysarthria difficulty swallowing.   Discharge Exam: Vitals:   10/14/20 1000 10/14/20 1130  BP: 105/81 (!) 125/59  Pulse: 73 72  Resp: 17 20  Temp:    SpO2: 94% 92%   Vitals:   10/14/20 0733 10/14/20 0830 10/14/20 1000 10/14/20 1130  BP:  118/69 105/81 (!) 125/59  Pulse: 62 66 73 72  Resp:  $Remo'15 17 20  'IXZEd$ Temp:      TempSrc:      SpO2: 100% 100% 94% 92%  Weight:      Height:        General: Pt is alert, awake, not in acute distress Cardiovascular: RRR, S1/S2 +, no rubs, no gallops Respiratory: CTA bilaterally, no wheezing, no rhonchi Abdominal: Soft, NT, ND, bowel sounds + Extremities: no edema, no cyanosis    The results of significant diagnostics from this hospitalization (including imaging, microbiology, ancillary and laboratory) are listed below for reference.     Microbiology: Recent Results (from the past 240 hour(s))  Respiratory Panel by RT PCR (Flu A&B, Covid) - Nasopharyngeal Swab     Status: None   Collection Time: 10/13/20  3:23 PM   Specimen: Nasopharyngeal Swab  Result Value Ref Range Status   SARS Coronavirus 2 by RT PCR NEGATIVE NEGATIVE Final    Comment: (NOTE) SARS-CoV-2 target nucleic acids are NOT DETECTED.  The SARS-CoV-2 RNA is generally detectable in upper  respiratoy specimens during the acute phase of infection. The lowest concentration of SARS-CoV-2 viral copies this assay can detect is 131 copies/mL. A negative result does not preclude SARS-Cov-2 infection and should not be used as the sole basis for treatment or other patient management decisions. A negative result may occur with  improper specimen collection/handling, submission of specimen other than nasopharyngeal swab, presence of viral mutation(s) within the areas targeted by this assay, and inadequate number of viral copies (<131 copies/mL). A negative result must be combined with clinical observations, patient history, and epidemiological information. The expected result is Negative.  Fact Sheet for Patients:  PinkCheek.be  Fact Sheet for Healthcare Providers:  GravelBags.it  This test is no t yet approved or cleared by the [place] and  has been authorized for detection and/or diagnosis of SARS-CoV-2 by FDA under an Emergency Use Authorization (EUA). This EUA will remain  in effect (meaning this test can be used) for the duration of the COVID-19 declaration under Section 564(b)(1) of the Act, 21 U.S.C. section 360bbb-3(b)(1), unless the authorization is terminated or revoked sooner.     Influenza A by PCR NEGATIVE NEGATIVE Final   Influenza B by PCR NEGATIVE NEGATIVE Final    Comment: (NOTE) The Xpert Xpress SARS-CoV-2/FLU/RSV assay is intended as an aid in  the diagnosis of influenza from Nasopharyngeal swab specimens and  should not be used as a sole basis for treatment. Nasal washings and  aspirates are unacceptable for Xpert Xpress SARS-CoV-2/FLU/RSV  testing.  Fact Sheet for Patients: PinkCheek.be  Fact Sheet for Healthcare Providers: GravelBags.it  This test is not yet approved or cleared by the [place] FDA and  has been  authorized for detection and/or diagnosis of SARS-CoV-2 by  FDA under an Emergency Use Authorization (EUA). This EUA will remain  in effect (meaning this test can be used) for the duration of the  Covid-19 declaration under Section 564(b)(1) of the Act, 21  U.S.C. section 360bbb-3(b)(1), unless the authorization is  terminated or revoked. Performed at [facility], [place] [address], [place], [place] [address]      Labs: BNP (last 3 results) No results for input(s): BNP in the last 8760 hours. Basic Metabolic Panel: Recent Labs  Lab 10/13/20 1529 10/13/20 1556 10/13/20 1634 10/13/20 2141 10/14/20 0004 10/14/20 0210  NA 141 140 143 142  --  141  K 3.9 4.2 3.7 3.6  --  3.8  CL 98 99  --   --   --  99  CO2 31  --   --   --   --  30  GLUCOSE 167* 161*  --   --   --  141*  BUN 15 21  --   --   --  14  CREATININE 1.20* 1.10*  --   --   --  0.95  CALCIUM 9.6  --   --   --   --  9.1  MG  --   --   --   --  1.8  --    Liver Function Tests: Recent Labs  Lab 10/13/20 1529 10/14/20 0210  AST 15 14*  ALT 21 19  ALKPHOS 176* 161*  BILITOT 0.8 0.6  PROT 6.9 6.2*  ALBUMIN 3.3* 2.9*   No results for input(s): LIPASE, AMYLASE in the last 168 hours. Recent Labs  Lab 10/13/20 1607  AMMONIA 17   CBC: Recent Labs  Lab 10/13/20 1529 10/13/20 1556 10/13/20 1634 10/13/20 2141 10/14/20 0210  WBC 12.6*  --   --   --  8.5  NEUTROABS 9.0*  --   --   --   --   HGB 12.6 13.3 12.2 11.2* 11.2*  HCT 40.9 39.0 36.0 33.0* 36.0  MCV 93.2  --   --   --  93.5  PLT 216  --   --   --  175   Cardiac Enzymes: No results for input(s): CKTOTAL, CKMB, CKMBINDEX, TROPONINI in the last 168 hours. BNP: Invalid input(s): POCBNP CBG: Recent Labs  Lab 10/13/20 2157 10/14/20 0827 10/14/20 1124  GLUCAP 119* 126* 132*   D-Dimer No results for input(s): DDIMER in the last 72  hours. Hgb A1c No results for input(s): HGBA1C in the last 72 hours. Lipid Profile No results for input(s): CHOL,  HDL, LDLCALC, TRIG, CHOLHDL, LDLDIRECT in the last 72 hours. Thyroid function studies Recent Labs    10/14/20 0004  TSH 1.493   Anemia work up No results for input(s): VITAMINB12, FOLATE, FERRITIN, TIBC, IRON, RETICCTPCT in the last 72 hours. Urinalysis    Component Value Date/Time   COLORURINE YELLOW 10/13/2020 1855   APPEARANCEUR CLEAR 10/13/2020 1855   LABSPEC 1.016 10/13/2020 1855   PHURINE 6.0 10/13/2020 1855   GLUCOSEU >=500 (A) 10/13/2020 1855   HGBUR NEGATIVE 10/13/2020 1855   BILIRUBINUR NEGATIVE 10/13/2020 1855   KETONESUR NEGATIVE 10/13/2020 1855   PROTEINUR NEGATIVE 10/13/2020 1855   NITRITE NEGATIVE 10/13/2020 1855   LEUKOCYTESUR NEGATIVE 10/13/2020 1855   Sepsis Labs Invalid input(s): PROCALCITONIN,  WBC,  LACTICIDVEN Microbiology Recent Results (from the past 240 hour(s))  Respiratory Panel by RT PCR (Flu A&B, Covid) - Nasopharyngeal Swab     Status: None   Collection Time: 10/13/20  3:23 PM   Specimen: Nasopharyngeal Swab  Result Value Ref Range Status   SARS Coronavirus 2 by RT PCR NEGATIVE NEGATIVE Final    Comment: (NOTE) SARS-CoV-2 target nucleic acids are NOT DETECTED.  The SARS-CoV-2 RNA is generally detectable in upper respiratoy specimens during the acute phase of infection. The lowest concentration of SARS-CoV-2 viral copies this assay can detect is 131 copies/mL. A negative result does not preclude SARS-Cov-2 infection and should not be used as the sole basis for treatment or other patient management decisions. A negative result may occur with  improper specimen collection/handling, submission of specimen other than nasopharyngeal swab, presence of viral mutation(s) within the areas targeted by this assay, and inadequate number of viral copies (<131 copies/mL). A negative result must be combined with clinical observations, patient history, and epidemiological information. The expected result is Negative.  Fact Sheet for Patients:   PinkCheek.be  Fact Sheet for Healthcare Providers:  GravelBags.it  This test is no t yet approved or cleared by the [place] FDA and  has been authorized for detection and/or diagnosis of SARS-CoV-2 by FDA under an Emergency Use Authorization (EUA). This EUA will remain  in effect (meaning this test can be used) for the duration of the COVID-19 declaration under Section 564(b)(1) of the Act, 21 U.S.C. section 360bbb-3(b)(1), unless the authorization is terminated or revoked sooner.     Influenza A by PCR NEGATIVE NEGATIVE Final   Influenza B by PCR NEGATIVE NEGATIVE Final    Comment: (NOTE) The Xpert Xpress SARS-CoV-2/FLU/RSV assay is intended as an aid in  the diagnosis of influenza from Nasopharyngeal swab specimens and  should not be used as a sole basis for treatment. Nasal washings and  aspirates are unacceptable for Xpert Xpress SARS-CoV-2/FLU/RSV  testing.  Fact Sheet for Patients: PinkCheek.be  Fact Sheet for Healthcare Providers: GravelBags.it  This test is not yet approved or cleared by the [place] FDA and  has been authorized for detection and/or diagnosis of SARS-CoV-2 by  FDA under an Emergency Use Authorization (EUA). This EUA will remain  in effect (meaning this test can be used) for the duration of the  Covid-19 declaration under Section 564(b)(1) of the Act, 21  U.S.C. section 360bbb-3(b)(1), unless the authorization is  terminated or revoked. Performed at [facility], [place] [address] [place], [place] [address]      Time coordinating discharge: Over 30 minutes  SIGNED:  Little Ishikawa, DO [facility] 10/14/2020, 1:04 PM Pager   If 7PM-7AM, please contact night-coverage [email or web address]

## 2020-10-14 NOTE — ED Notes (Signed)
Pt assisted to bedside commode. O2 sat remained 100% while out of bed while on bipap.

## 2020-10-14 NOTE — ED Notes (Signed)
Lunch Tray Ordered @ 1020.  

## 2020-10-14 NOTE — Evaluation (Signed)
Physical Therapy Evaluation Patient Details Name: Brenda Morgan MRN: [id number] DOB: [birth year] Today's Date: 10/14/2020   History of Present Illness  Pt is a 64 y/o female admitted secondary to acute metabolic encephalopathy. Pt with recent lumbar surgery with infection. PMH includes COPD, HTN, DM, and OSA on BIPAP.   Clinical Impression  Pt admitted secondary to problem above with deficits below. Requiring min guard A For ambulation with upright walker. Pt reports pain in LLE, but did not seem to limit ambulation. Pt reporting some falls, so recommend HHPT and HHOT follow up at d/c. Will continue to follow acutely to maximize functional mobility independence and safety.     Follow Up Recommendations Home health PT;Other (comment) (HHOT )    Equipment Recommendations  None recommended by PT    Recommendations for Other Services       Precautions / Restrictions Precautions Precautions: Fall Restrictions Weight Bearing Restrictions: No      Mobility  Bed Mobility Overal bed mobility: Needs Assistance Bed Mobility: Supine to Sit;Sit to Supine     Supine to sit: Min assist Sit to supine: Supervision   General bed mobility comments: Min A for trunk elevation     Transfers Overall transfer level: Needs assistance Equipment used:  (upright walker) Transfers: Sit to/from Stand Sit to Stand: Min guard         General transfer comment: Min guard for safety  Ambulation/Gait Ambulation/Gait assistance: Min guard Gait Distance (Feet): 40 Feet Assistive device:  (upright walker) Gait Pattern/deviations: Step-through pattern;Decreased stride length;Decreased weight shift to right;Antalgic Gait velocity: Decreased   General Gait Details: Mildly antalgic gait secondary to LLE pain. Gait within ED room. No LOB noted.   Stairs            Wheelchair Mobility    Modified Rankin (Stroke Patients Only)       Balance Overall balance assessment: Needs  assistance Sitting-balance support: No upper extremity supported;Feet supported Sitting balance-Leahy Scale: Good     Standing balance support: Bilateral upper extremity supported;During functional activity Standing balance-Leahy Scale: Poor Standing balance comment: Reliant on BUE support                              Pertinent Vitals/Pain Pain Assessment: Faces Faces Pain Scale: Hurts even more Pain Location: LLE  Pain Descriptors / Indicators: Grimacing;Guarding Pain Intervention(s): Limited activity within patient's tolerance;Monitored during session;Repositioned    Home Living Family/patient expects to be discharged to:: Private residence Living Arrangements: Spouse/significant other Available Help at Discharge: Family;Available 24 hours/day Type of Home: Mobile home Home Access: Ramped entrance     Home Layout: One level Home Equipment: Walker - 2 wheels;Shower seat;Grab bars - toilet;Other (comment) (upright Rollator; bidet)      Prior Function Level of Independence: Needs assistance   Gait / Transfers Assistance Needed: Uses upright walker for ambulation   ADL's / Homemaking Assistance Needed: Reports sometimes requiring assist for bathing.         Hand Dominance        Extremity/Trunk Assessment   Upper Extremity Assessment Upper Extremity Assessment: Overall WFL for tasks assessed    Lower Extremity Assessment Lower Extremity Assessment: Generalized weakness;LLE deficits/detail LLE Deficits / Details: Reports LLE pain     Cervical / Trunk Assessment Cervical / Trunk Assessment: Normal  Communication   Communication: No difficulties  Cognition Arousal/Alertness: Awake/alert Behavior During Therapy: WFL for tasks assessed/performed Overall Cognitive Status: Within Functional Limits for  tasks assessed                                        General Comments General comments (skin integrity, edema, etc.): Pt's husband  present during session     Exercises     Assessment/Plan    PT Assessment Patient needs continued PT services  PT Problem List Decreased strength;Decreased balance;Decreased mobility;Pain;Decreased activity tolerance       PT Treatment Interventions Gait training;DME instruction;Functional mobility training;Therapeutic activities;Therapeutic exercise;Balance training;Patient/family education    PT Goals (Current goals can be found in the Care Plan section)  Acute Rehab PT Goals Patient Stated Goal: to go home PT Goal Formulation: With patient Time For Goal Achievement: 10/28/20 Potential to Achieve Goals: Good    Frequency Min 3X/week   Barriers to discharge        Co-evaluation               AM-PAC PT "6 Clicks" Mobility  Outcome Measure Help needed turning from your back to your side while in a flat bed without using bedrails?: A Little Help needed moving from lying on your back to sitting on the side of a flat bed without using bedrails?: A Little Help needed moving to and from a bed to a chair (including a wheelchair)?: A Little Help needed standing up from a chair using your arms (e.g., wheelchair or bedside chair)?: A Little Help needed to walk in hospital room?: A Little Help needed climbing 3-5 steps with a railing? : A Lot 6 Click Score: 17    End of Session Equipment Utilized During Treatment: Gait belt Activity Tolerance: Patient tolerated treatment well Patient left: in bed;with call bell/phone within reach;with family/visitor present (on stretcher in ED ) Nurse Communication: Mobility status PT Visit Diagnosis: Other abnormalities of gait and mobility (R26.89);Muscle weakness (generalized) (M62.81);Pain Pain - Right/Left: Left Pain - part of body: Leg    Time: 1962-2297 PT Time Calculation (min) (ACUTE ONLY): 17 min   Charges:   PT Evaluation $PT Eval Moderate Complexity: 1 Mod          Farley Ly, PT, DPT  Acute Rehabilitation Services   Pager: [phone] Office: [phone]   Lehman Prom 10/14/2020, 1:44 PM

## 2020-10-14 NOTE — ED Notes (Signed)
Ordered breakfast--Brenda Morgan 

## 2020-10-15 LAB — HEMOGLOBIN A1C
Hgb A1c MFr Bld: 6.4 % — ABNORMAL HIGH (ref 4.8–5.6)
Mean Plasma Glucose: 137 mg/dL

## 2020-10-18 ENCOUNTER — Ambulatory Visit: Payer: BC Managed Care – PPO | Admitting: Infectious Diseases

## 2020-10-20 ENCOUNTER — Ambulatory Visit: Payer: BC Managed Care – PPO | Admitting: Infectious Diseases

## 2020-11-07 ENCOUNTER — Other Ambulatory Visit: Payer: Self-pay | Admitting: Infectious Diseases

## 2020-11-07 ENCOUNTER — Encounter: Payer: Self-pay | Admitting: Infectious Diseases

## 2020-11-07 ENCOUNTER — Ambulatory Visit (INDEPENDENT_AMBULATORY_CARE_PROVIDER_SITE_OTHER): Payer: BC Managed Care – PPO | Admitting: Infectious Diseases

## 2020-11-07 ENCOUNTER — Other Ambulatory Visit: Payer: Self-pay

## 2020-11-07 VITALS — BP 117/67 | HR 80 | Wt 184.0 lb

## 2020-11-07 DIAGNOSIS — I83018 Varicose veins of right lower extremity with ulcer other part of lower leg: Secondary | ICD-10-CM | POA: Diagnosis not present

## 2020-11-07 DIAGNOSIS — L97812 Non-pressure chronic ulcer of other part of right lower leg with fat layer exposed: Secondary | ICD-10-CM

## 2020-11-07 DIAGNOSIS — T847XXD Infection and inflammatory reaction due to other internal orthopedic prosthetic devices, implants and grafts, subsequent encounter: Secondary | ICD-10-CM | POA: Diagnosis not present

## 2020-11-07 DIAGNOSIS — Z23 Encounter for immunization: Secondary | ICD-10-CM

## 2020-11-07 MED ORDER — DOXYCYCLINE HYCLATE 100 MG PO TABS: 100.0000 mg | ORAL_TABLET | Freq: Two times a day (BID) | ORAL | 11 refills | Status: DC

## 2020-11-07 NOTE — Patient Instructions (Signed)
Please continue the doxycycline twice a day   Please stop by the lab on your way out.   We gave you your booster of the pneumococcal 23 (pneumovax) pneumonia vaccine   No signs of infection on your current CT scan which is great news.   Please return in 4 months to check in again.   Will try to get you a referral to the [facility] wound

## 2020-11-07 NOTE — Progress Notes (Signed)
Patient: Brenda Morgan  DOB: [birth year] MRN: [id number] PCP: Medicine, [facility]      Patient Active Problem List   Diagnosis Date Noted  . 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 Hardware complicating wound infection (HCC) 08/02/2020    Priority: High  . Stasis ulcer (HCC) 12/21/2020  . Acute encephalopathy 10/13/2020  . AKI (acute kidney injury) (HCC) 09/15/2020  . COPD (chronic obstructive pulmonary disease) (HCC) 08/01/2020  . DM II (diabetes mellitus, type II), controlled (HCC) 08/01/2020  . HTN (hypertension) 08/01/2020  . Postoperative wound infection 07/31/2020  . Postoperative back pain 07/06/2020  . Lumbar radiculopathy 07/06/2020  . Lumbar spinal stenosis 06/28/2020     Subjective:  Brenda Morgan is a 64 y.o. 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   COPD flare requiring hospitalization a few weeks ago. Had a fall and fracture in T12 vertebra, not able to have vertebroplasty. She is doing a little better since then.  She would like to consider at some point repeating spinal injections for chronic pain management.   She is tearful today in discussing pain in the back from fall along with other medical problems recently. New ulcer that she has been dealing with on lower extremity that has not healed.    Review of Systems  Constitutional: Negative for chills and fever.  HENT: Negative for tinnitus.   Eyes: Negative for blurred vision and photophobia.  Respiratory: Negative for cough and sputum production.   Cardiovascular: Negative for chest pain.  Gastrointestinal: Negative for diarrhea, nausea and vomiting.  Genitourinary: Negative for dysuria.  Skin: Negative for rash.  Neurological: Negative for headaches.    Past Medical History:  Diagnosis  Date  . COPD (chronic obstructive pulmonary disease) (HCC)   . Diabetes mellitus without complication (HCC)   . Fatty liver 2012   per pt 06/24/20  . Sleep apnea     Outpatient Medications Prior to Visit  Medication Sig Dispense Refill  . atorvastatin (LIPITOR) 10 MG tablet Take 5 mg by mouth daily.    [name] azelastine (ASTELIN) 0.1 % nasal spray Place 2 sprays into both nostrils 2 (two) times daily as needed for rhinitis (congestion).     . carvedilol (COREG) 25 MG tablet Take 25 mg by mouth at bedtime.     . cetirizine (ZYRTEC) 10 MG tablet Take 10 mg by mouth daily.    . Cholecalciferol (VITAMIN D) 50 MCG (2000 UT) tablet Take 2,000 Units by mouth daily.    . D3-50 1.25 MG (50000 UT) capsule Take 50,000 Units by mouth once a week. Sunday    . diclofenac Sodium (VOLTAREN) 1 % GEL Apply 4 g topically 4 (four) times daily. (Patient taking differently: Apply 4 g topically 4 (four) times daily as needed (pain). ) 100 g 0  . Empagliflozin-metFORMIN HCl ER (SYNJARDY XR) 25-1000 MG TB24 Take 1 tablet by mouth daily.    . famotidine (PEPCID) 40 MG tablet Take 40 mg by mouth daily.     . ferrous sulfate 325 (65 FE) MG tablet Take 325 mg by mouth daily with breakfast.    . hydrOXYzine (ATARAX/VISTARIL) 25 MG tablet Take 12.5 mg by mouth at bedtime.    [name] icosapent Ethyl (VASCEPA) 1 g capsule Take 1 g by mouth 2 (two) times daily.    [name] ipratropium-albuterol (  DUONEB) 0.5-2.5 (3) MG/3ML SOLN Take 3 mLs by nebulization every 6 (six) hours as needed (shortness of breath/wheezing/asthma).     . lactulose (CHRONULAC) 10 GM/15ML solution Take 10 g by mouth 3 (three) times daily as needed (constipation).     [name] lamoTRIgine (LAMICTAL) 150 MG tablet Take 150 mg by mouth See admin instructions. Take one tablet (150 mg) by mouth every morning with a 25 mg tablet for a total dose of 175 mg daily    . magnesium oxide (MAG-OX) 400 MG tablet Take 400 mg by mouth daily.    [name] NOVOLOG MIX 70/30 FLEXPEN (70-30) 100 UNIT/ML  FlexPen Inject 60 Units into the skin 2 (two) times daily with a meal.     . OZEMPIC, 1 MG/DOSE, 4 MG/3ML SOPN Inject 1 mg into the skin once a week. Take on Sunday    . pantoprazole (PROTONIX) 40 MG tablet Take 40 mg by mouth daily.    . pioglitazone (ACTOS) 15 MG tablet Take 15 mg by mouth daily.     . potassium chloride SA (KLOR-CON) 20 MEQ tablet Take 20 mEq by mouth daily.    . pramipexole (MIRAPEX) 0.5 MG tablet Take 1 mg by mouth at bedtime.    . pregabalin (LYRICA) 200 MG capsule Take 200 mg by mouth in the morning, at noon, and at bedtime.    [name] spironolactone (ALDACTONE) 50 MG tablet Take 50 mg by mouth daily.    [name] torsemide (DEMADEX) 20 MG tablet Take 20 mg by mouth daily.     [name] doxycycline (VIBRA-TABS) 100 MG tablet Take 1 tablet (100 mg total) by mouth 2 (two) times daily. 60 tablet 1  . baclofen (LIORESAL) 20 MG tablet     . buprenorphine (BUTRANS) 15 MCG/HR     . Fluticasone-Salmeterol (ADVAIR) 250-50 MCG/DOSE AEPB Inhale 1 puff into the lungs 2 (two) times daily as needed (shortness of breath/wheezing).     [name] lamoTRIgine (LAMICTAL) 25 MG tablet Take 25 mg by mouth See admin instructions. Take one tablet (25 mg) by mouth every morning with a 150 mg tablet for a total dose of 175 mg daily    . losartan (COZAAR) 25 MG tablet Take 25 mg by mouth daily.    [name] morphine (MSIR) 15 MG tablet Take 0.5 tablets (7.5 mg total) by mouth every 4 (four) hours as needed for severe pain. (Patient not taking: Reported on 10/13/2020) 5 tablet 0  . oxyCODONE (OXY IR/ROXICODONE) 5 MG immediate release tablet Take 5 mg by mouth 4 (four) times daily as needed for moderate pain.     . OXYGEN Inhale 3 L into the lungs at bedtime. Use with BIPAP    . rifabutin (MYCOBUTIN) 150 MG capsule Take 2 capsules (300 mg total) by mouth daily. 60 capsule 0  . silver sulfADIAZINE (SILVADENE) 1 % cream Apply 1 application topically daily as needed (leg sores).      No facility-administered medications prior to visit.      Allergies  Allergen Reactions  . Amlodipine Anaphylaxis, Swelling and Other (See Comments)    Tongue swelling   . Naltrexone Hives  . Penicillins Hives and Other (See Comments)    Tolerated ceftriaxone and cefepime 06/2017 Hives      Social History   Tobacco Use  . Smoking status: Current Every Day Smoker    Packs/day: 1.00    Types: Cigarettes  . Smokeless tobacco: Never Used  Substance Use Topics  . Alcohol use: Not Currently  .  Drug use: Not Currently     Objective:   Vitals:   11/07/20 1448  BP: 117/67  Pulse: 80  Weight: 184 lb (83.5 kg)   Body mass index is 29.7 kg/m.  Physical Exam Pulmonary:     Effort: Pulmonary effort is normal.     Comments: No shortness of breath detected in conversation.  Neurological:     Mental Status: She is oriented to person, place, and time.  Psychiatric:        Mood and Affect: Mood normal.        Behavior: Behavior normal.        Thought Content: Thought content normal.        Judgment: Judgment normal.     Lab Results: Lab Results  Component Value Date   WBC 13.4 (H) 11/07/2020   HGB 12.6 11/07/2020   HCT 38.3 11/07/2020   MCV 89.7 11/07/2020   PLT 226 11/07/2020    Lab Results  Component Value Date   CREATININE 0.95 10/14/2020   BUN 14 10/14/2020   NA 141 10/14/2020   K 3.8 10/14/2020   CL 99 10/14/2020   CO2 30 10/14/2020    Lab Results  Component Value Date   ALT 19 10/14/2020   AST 14 (L) 10/14/2020   ALKPHOS 161 (H) 10/14/2020   BILITOT 0.6 10/14/2020     Assessment & Plan:   Problem List Items Addressed This Visit      High   Hardware complicating wound infection (HCC) - Primary    Unfortunately she had a fall with compression fracture in other vertebral body. CT scan fortunately without any findings c/w ongoing discitis or muscle/soft tissue inflammation.  Will continue suppressive doxycycline for possible staphylococcal involvement for chronic suppression over the next 8 months. May be  able to stop and watch closely for signs of recurrence vs ongoing suppression.  Would like to wait another month or so before considering resuming spinal injections to ensure her infection is under good control. She will always be at risk for a new infection if these are resumed and I cannot guarantee the suppressive antibiotics will prevent that for her.   She is asking if she is due for a pneumonia vaccine - will provide today based on records review.   Return in about 4 months (around 03/07/2021).       Relevant Orders   C-reactive protein (Completed)   Sedimentation rate (Completed)   CBC (Completed)     Unprioritized   Stasis ulcer (HCC)    Referral to wound center for assistance in management. No signs of infection. Recommend elevation and compression, good fitting shoes that provide low friction points.       Relevant Orders   AMB referral to wound care center    Other Visit Diagnoses    Need for vaccination against Streptococcus pneumoniae using pneumococcal conjugate vaccine 13       Relevant Orders   Pneumococcal polysaccharide vaccine 23-valent greater than or equal to 2yo subcutaneous/IM (Completed)      Rexene Alberts, MSN, NP-C [facility]  [email or web address] Pager: [phone] Office: [phone] [facility] Main Line: [phone]

## 2020-11-07 NOTE — Telephone Encounter (Signed)
Pending. Has appt today at 3

## 2020-11-08 LAB — CBC
HCT: 38.3 % (ref 35.0–45.0)
Hemoglobin: 12.6 g/dL (ref 11.7–15.5)
MCH: 29.5 pg (ref 27.0–33.0)
MCHC: 32.9 g/dL (ref 32.0–36.0)
MCV: 89.7 fL (ref 80.0–100.0)
MPV: 10.5 fL (ref 7.5–12.5)
Platelets: 226 10*3/uL (ref 140–400)
RBC: 4.27 10*6/uL (ref 3.80–5.10)
RDW: 14.1 % (ref 11.0–15.0)
WBC: 13.4 10*3/uL — ABNORMAL HIGH (ref 3.8–10.8)

## 2020-11-08 LAB — C-REACTIVE PROTEIN: CRP: 19.8 mg/L — ABNORMAL HIGH (ref <8.0)

## 2020-11-08 LAB — SEDIMENTATION RATE: Sed Rate: 41 mm/h — ABNORMAL HIGH (ref 0–30)

## 2020-12-14 ENCOUNTER — Telehealth: Payer: Self-pay

## 2020-12-14 NOTE — Telephone Encounter (Signed)
There is always an ongoing risk of new infection associated with injections. She should be on suppressive antibiotics (doxycycline) still which she is to continue for another 8 months at least. I can not guarantee this will not flare her known infection and cannot guarantee she will not acquire a new infection.   That being said, her current infection seems to be under good control from our recent visit; if she feels that she would benefit from spinal injections and it is worth the risk of potential new infection then we can provide a letter with an update on her condition. I am not in the office to sign it, however so will need to wait until next week at my return.

## 2020-12-14 NOTE — Telephone Encounter (Signed)
Patients husband called asking if would be ok for patient to still have injections in her back for pain with [facility] with her having the infection. Patient's husband stated the patient will more than likely need something in writing giving her clearance. She was suppose to have it done this week and was unable to get it done due to needing clearance from out office per patient's husband. Please advise. Yurika Pereda T [name]

## 2020-12-14 NOTE — Telephone Encounter (Signed)
Patient advised and will call back next week to let our office know if she decides to go through with the spinal injections. She stated she wanted to think about her risks first. Brenda Morgan T [name]

## 2020-12-19 NOTE — Telephone Encounter (Signed)
Patient's husband called officer requesting letter for pain clinic to get spinal injections. Will forward message to provider for letter. Will call patient once completed. Lorenso Courier, [place]

## 2020-12-20 ENCOUNTER — Encounter: Payer: Self-pay | Admitting: Infectious Diseases

## 2020-12-20 NOTE — Telephone Encounter (Signed)
Spoke with patient who will try to print letter at home. Advised she call office if she has any issues printing.

## 2020-12-20 NOTE — Telephone Encounter (Signed)
I put a letter in her chart - she should be able to access this via her MyChart if that can save her a trip.  Otherwise I can come sign it and leave it at the front office.   TY Judeth Cornfield

## 2020-12-21 DIAGNOSIS — I872 Venous insufficiency (chronic) (peripheral): Secondary | ICD-10-CM | POA: Insufficient documentation

## 2020-12-21 NOTE — Assessment & Plan Note (Signed)
Referral to wound center for assistance in management. No signs of infection. Recommend elevation and compression, good fitting shoes that provide low friction points.

## 2020-12-21 NOTE — Assessment & Plan Note (Addendum)
Unfortunately she had a fall with compression fracture in other vertebral body. CT scan fortunately without any findings c/w ongoing discitis or muscle/soft tissue inflammation.  Will continue suppressive doxycycline for possible staphylococcal involvement for chronic suppression over the next 8 months. May be able to stop and watch closely for signs of recurrence vs ongoing suppression.  Would like to wait another month or so before considering resuming spinal injections to ensure her infection is under good control. She will always be at risk for a new infection if these are resumed and I cannot guarantee the suppressive antibiotics will prevent that for her.   She is asking if she is due for a pneumonia vaccine - will provide today based on records review.   Return in about 4 months (around 03/07/2021).

## 2021-02-27 ENCOUNTER — Other Ambulatory Visit: Payer: Self-pay

## 2021-02-27 ENCOUNTER — Encounter: Payer: Self-pay | Admitting: Infectious Diseases

## 2021-02-27 ENCOUNTER — Ambulatory Visit (INDEPENDENT_AMBULATORY_CARE_PROVIDER_SITE_OTHER): Payer: BC Managed Care – PPO | Admitting: Infectious Diseases

## 2021-02-27 VITALS — BP 102/65 | HR 70 | Temp 98.0°F | Resp 16 | Ht 66.0 in | Wt 180.6 lb

## 2021-02-27 DIAGNOSIS — T847XXD Infection and inflammatory reaction due to other internal orthopedic prosthetic devices, implants and grafts, subsequent encounter: Secondary | ICD-10-CM | POA: Diagnosis not present

## 2021-02-27 DIAGNOSIS — I872 Venous insufficiency (chronic) (peripheral): Secondary | ICD-10-CM

## 2021-02-27 DIAGNOSIS — M4626 Osteomyelitis of vertebra, lumbar region: Secondary | ICD-10-CM | POA: Diagnosis not present

## 2021-02-27 NOTE — Patient Instructions (Signed)
Nice to see you.   Be careful in the sun with the antibiotic. Stay out for only short time frames between 10 am and 4 pm. Full coverage with hat, long sleeves/pants.   Continue the doxycycline twice a day until your next appointment.   Would like to see you back around August to see how you are doing.

## 2021-02-27 NOTE — Assessment & Plan Note (Signed)
Culture negative. Given hardware present would like to get her through 1 year of doxycycline suppression. No acute signs of worsening of infection today.  Fall in October with T12 - L5 compression fractures. No worsening of osteomyelitis/discitis at that scan (infact none seen at all on read). There was a reference to lucency along the right L5 pedicle screw suspicious for hardware loosening.  Unclear if this is r/t mechanical fall/fracture vs infection. May consider repeating CT scan at return visit to see if we can re-image this.   Still with persistent back pain - worse after her fall 4 months ago with new fractures in spine.  She is planning spinal stimulator placement with pain clinic.  Could consider second opinion for NSGY locally here.   Check CRP, ESR today for therapeutic drug monitoring. RTC in August.

## 2021-02-27 NOTE — Assessment & Plan Note (Signed)
She has features c/w lipodermatosclerosis. Heavy hyperpigmentation, swelling and tense bottle-necking of skin. She was seeing a vascular surgeon at one point. Compression stockings recommended. Possible f/u with vein & vascular also. I don't see where any venous refulx studies were done. No new ulcerations present today.

## 2021-02-27 NOTE — Progress Notes (Signed)
Patient: Brenda Morgan  DOB: [birth year] MRN: [id number] PCP: Medicine, [facility]     Subjective:  Brenda Morgan is a 65 y.o. 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     HPI since LOV November 2021: Here with her husband today for follow up. She has continued doxyycline twice daily. She has shooting pains in the left lateral leg from the ankle bone up to her hip. She has had spinal injections that are not helping. Spinal stimulator scheduled 3/30 but trying to have it moved up. Overall her back pain is about the same quality. Neurosurgery "no other recommendations for care d/t infection."   Wondering about darkening in her lower legs. Leg ulceration has healed up. Feels like the swelling is constant, painful and darker color has been increasing. No fevers, chills.   She would really like to go out and tend to her garden. Husband has tried to bring her garden to her but not the same. He has concerns with her getting off balance. Requesting possible PT visits to see if that will help. Felt that was really helpful last time.    Review of Systems  Constitutional: Negative for chills and fever.  HENT: Negative for tinnitus.   Eyes: Negative for blurred vision and photophobia.  Respiratory: Negative for cough and sputum production.   Cardiovascular: Positive for leg swelling. Negative for chest pain.  Gastrointestinal: Negative for diarrhea, nausea and vomiting.  Genitourinary: Negative for dysuria.  Musculoskeletal: Positive for back pain and falls.  Skin: Positive for rash.  Neurological: Positive for weakness. Negative for headaches.    Past Medical History:  Diagnosis Date  . COPD (chronic obstructive pulmonary disease) ([place])   . Diabetes mellitus without complication ([place])   . Fatty liver  2012   per pt 06/24/20  . Sleep apnea     Outpatient Medications Prior to Visit  Medication Sig Dispense Refill  . atorvastatin (LIPITOR) 10 MG tablet Take 5 mg by mouth daily.    [name] azelastine (ASTELIN) 0.1 % nasal spray Place 2 sprays into both nostrils 2 (two) times daily as needed for rhinitis (congestion).     . baclofen (LIORESAL) 20 MG tablet     . carvedilol (COREG) 25 MG tablet Take 25 mg by mouth at bedtime.     . cetirizine (ZYRTEC) 10 MG tablet Take 10 mg by mouth daily.    . Cholecalciferol (VITAMIN D) 50 MCG (2000 UT) tablet Take 2,000 Units by mouth daily.    . D3-50 1.25 MG (50000 UT) capsule Take 50,000 Units by mouth once a week. Sunday    . diclofenac Sodium (VOLTAREN) 1 % GEL Apply 4 g topically 4 (four) times daily. (Patient taking differently: Apply 4 g topically 4 (four) times daily as needed (pain).) 100 g 0  . doxycycline (VIBRA-TABS) 100 MG tablet Take 1 tablet (100 mg total) by mouth 2 (two) times daily. 60 tablet 11  . Empagliflozin-metFORMIN HCl ER (SYNJARDY XR) 25-1000 MG TB24 Take 1 tablet by mouth daily.    . famotidine (PEPCID) 40 MG tablet Take 40 mg by mouth daily.     . ferrous sulfate 325 (65 FE) MG tablet Take 325 mg by mouth daily with breakfast.    . Fluticasone-Salmeterol (ADVAIR) 250-50 MCG/DOSE AEPB Inhale 1 puff into the lungs 2 (two) times daily as needed (shortness of breath/wheezing).     [name]  hydrOXYzine (ATARAX/VISTARIL) 25 MG tablet Take 12.5 mg by mouth at bedtime.    [name] icosapent Ethyl (VASCEPA) 1 g capsule Take 1 g by mouth 2 (two) times daily.    [name] ipratropium-albuterol (DUONEB) 0.5-2.5 (3) MG/3ML SOLN Take 3 mLs by nebulization every 6 (six) hours as needed (shortness of breath/wheezing/asthma).     . lactulose (CHRONULAC) 10 GM/15ML solution Take 10 g by mouth 3 (three) times daily as needed (constipation).     [name] lamoTRIgine (LAMICTAL) 150 MG tablet Take 150 mg by mouth See admin instructions. Take one tablet (150 mg) by mouth every morning  with a 25 mg tablet for a total dose of 175 mg daily    . lamoTRIgine (LAMICTAL) 25 MG tablet Take 25 mg by mouth See admin instructions. Take one tablet (25 mg) by mouth every morning with a 150 mg tablet for a total dose of 175 mg daily    . losartan (COZAAR) 25 MG tablet Take 25 mg by mouth daily.    . magnesium oxide (MAG-OX) 400 MG tablet Take 400 mg by mouth daily.    [name] morphine (MSIR) 15 MG tablet Take 0.5 tablets (7.5 mg total) by mouth every 4 (four) hours as needed for severe pain. 5 tablet 0  . NOVOLOG MIX 70/30 FLEXPEN (70-30) 100 UNIT/ML FlexPen Inject 60 Units into the skin 2 (two) times daily with a meal.     . oxyCODONE (OXY IR/ROXICODONE) 5 MG immediate release tablet Take 5 mg by mouth 4 (four) times daily as needed for moderate pain.     . OXYGEN Inhale 3 L into the lungs at bedtime. Use with BIPAP    . OZEMPIC, 1 MG/DOSE, 4 MG/3ML SOPN Inject 1 mg into the skin once a week. Take on Sunday    . pantoprazole (PROTONIX) 40 MG tablet Take 40 mg by mouth daily.    . pioglitazone (ACTOS) 15 MG tablet Take 15 mg by mouth daily.     . potassium chloride SA (KLOR-CON) 20 MEQ tablet Take 20 mEq by mouth daily.    . pramipexole (MIRAPEX) 0.5 MG tablet Take 1 mg by mouth at bedtime.    . pregabalin (LYRICA) 200 MG capsule Take 200 mg by mouth in the morning, at noon, and at bedtime.    . silver sulfADIAZINE (SILVADENE) 1 % cream Apply 1 application topically daily as needed (leg sores).     [name] spironolactone (ALDACTONE) 50 MG tablet Take 50 mg by mouth daily.    [name] torsemide (DEMADEX) 20 MG tablet Take 20 mg by mouth daily.     . rifabutin (MYCOBUTIN) 150 MG capsule Take 2 capsules (300 mg total) by mouth daily. 60 capsule 0  . buprenorphine (BUTRANS) 15 MCG/HR      No facility-administered medications prior to visit.     Allergies  Allergen Reactions  . Amlodipine Anaphylaxis, Swelling and Other (See Comments)    Tongue swelling   . Naltrexone Hives  . Penicillins Hives and Other  (See Comments)    Tolerated ceftriaxone and cefepime 06/2017 Hives    . Penicillin G     Social History   Tobacco Use  . Smoking status: Current Every Day Smoker    Packs/day: 1.00    Types: Cigarettes  . Smokeless tobacco: Never Used  . Tobacco comment: not thinking about it right now.  Substance Use Topics  . Alcohol use: Not Currently  . Drug use: Not Currently     Objective:   Vitals:  02/27/21 1527  BP: 102/65  Pulse: 70  Resp: 16  Temp: 98 F (36.7 C)  TempSrc: Temporal  SpO2: 94%  Weight: 180 lb 9.6 oz (81.9 kg)  Height: _0  (1.676 m)   Body mass index is 29.15 kg/m.  Physical Exam Constitutional:      Appearance: She is well-developed and well-nourished.     Comments: Seated comfortably in chair.   HENT:     Mouth/Throat:     Mouth: Mucous membranes are normal. No oral lesions.     Dentition: Normal dentition. No dental abscesses.     Pharynx: No oropharyngeal exudate.  Cardiovascular:     Rate and Rhythm: Normal rate and regular rhythm.     Heart sounds: Normal heart sounds.  Pulmonary:     Effort: Pulmonary effort is normal.     Breath sounds: Normal breath sounds.  Abdominal:     General: There is no distension.     Palpations: Abdomen is soft.     Tenderness: There is no abdominal tenderness.  Musculoskeletal:        General: Swelling (bilateral legs) and tenderness (bilateral shins) present.     Comments: No vertebral bony tenderness. Walking with an upright rolling walker    Lymphadenopathy:     Cervical: No cervical adenopathy.  Skin:    General: Skin is warm and dry.     Capillary Refill: Capillary refill takes less than 2 seconds.     Findings: Rash (hyperpigmentation staining overlying both lower extremeties. No warmth or redness. ) present.  Neurological:     Mental Status: She is alert and oriented to person, place, and time.  Psychiatric:        Mood and Affect: Mood and affect normal.        Judgment: Judgment normal.      Comments: In good spirits today and engaged in care discussion     Lab Results: Lab Results  Component Value Date   WBC 13.4 (H) 11/07/2020   HGB 12.6 11/07/2020   HCT 38.3 11/07/2020   MCV 89.7 11/07/2020   PLT 226 11/07/2020    Lab Results  Component Value Date   CREATININE 0.95 10/14/2020   BUN 14 10/14/2020   NA 141 10/14/2020   K 3.8 10/14/2020   CL 99 10/14/2020   CO2 30 10/14/2020    Lab Results  Component Value Date   ALT 19 10/14/2020   AST 14 (L) 10/14/2020   ALKPHOS 161 (H) 10/14/2020   BILITOT 0.6 10/14/2020     Assessment & Plan:   Problem List Items Addressed This Visit      High   Infection of lumbar spine (HCC)    Culture negative. Given hardware present would like to get her through 1 year of doxycycline suppression. No acute signs of worsening of infection today.  Fall in October with T12 - L5 compression fractures. No worsening of osteomyelitis/discitis at that scan (infact none seen at all on read). There was a reference to lucency along the right L5 pedicle screw suspicious for hardware loosening.  Unclear if this is r/t mechanical fall/fracture vs infection. May consider repeating CT scan at return visit to see if we can re-image this.   Still with persistent back pain - worse after her fall 4 months ago with new fractures in spine.  She is planning spinal stimulator placement with pain clinic.  Could consider second opinion for NSGY locally here.   Check CRP, ESR today for therapeutic  drug monitoring. RTC in August.       Hardware complicating wound infection ([place]) - Primary   Relevant Orders   C-reactive protein   Sedimentation rate     Unprioritized   Venous stasis dermatitis of both lower extremities    She has features c/w lipodermatosclerosis. Heavy hyperpigmentation, swelling and tense bottle-necking of skin. She was seeing a vascular surgeon at one point. Compression stockings recommended. Possible f/u with vein & vascular also. I  don't see where any venous refulx studies were done. No new ulcerations present today.          Janene Madeira, MSN, NP-C [facility] for Infectious Disease [email or web address] Pager: [phone] Office: [phone] [place]: [phone]

## 2021-02-28 LAB — C-REACTIVE PROTEIN: CRP: 3.2 mg/L (ref <8.0)

## 2021-02-28 LAB — SEDIMENTATION RATE: Sed Rate: 33 mm/h — ABNORMAL HIGH (ref 0–30)

## 2021-03-07 ENCOUNTER — Ambulatory Visit: Payer: BC Managed Care – PPO | Admitting: Infectious Diseases

## 2021-04-10 ENCOUNTER — Other Ambulatory Visit: Payer: Self-pay | Admitting: Pain Medicine

## 2021-04-28 NOTE — Pre-Procedure Instructions (Signed)
Surgical Instructions:    Your procedure is scheduled on Wednesday (08:30 AM- 10:32 AM).  Report to [facility] Main Entrance "A" at 06:30 A.M., then check in with the Admitting office.  Call this number if you have any questions prior to, or have any problems the morning of surgery:  [phone]    Remember:  Do not eat or drink after midnight the night before your surgery.     Take these medicines the morning of surgery with A SIP OF WATER: atorvastatin (LIPITOR) carvedilol (COREG) cetirizine (ZYRTEC) DULoxetine (CYMBALTA) icosapent Ethyl (VASCEPA)  lamoTRIgine (LAMICTAL) pantoprazole (PROTONIX) pregabalin (LYRICA)    IF NEEDED: oxyCODONE (OXY IR/ROXICODONE) azelastine (ASTELIN) nasal spray Fluticasone-Salmeterol (ADVAIR) inhaler ipratropium-albuterol (DUONEB) nebulizer treatment   As of today, STOP taking any Aspirin (unless otherwise instructed by your surgeon), NSAIDs including diclofenac Sodium (VOLTAREN) GEL Aleve, Naproxen, Ibuprofen, Motrin, Advil, Goody's, BC's, all herbal medications, fish oil, and all vitamins.            WHAT DO I DO ABOUT MY DIABETES MEDICATION?   Do not take Empagliflozin-metFORMIN HCl ER (SYNJARDY XR) the day before or the morning of surgery.   . The day of surgery, do not take other diabetes injectables, including OZEMPIC.    HOW TO MANAGE YOUR DIABETES BEFORE AND AFTER SURGERY  Why is it important to control my blood sugar before and after surgery? . Improving blood sugar levels before and after surgery helps healing and can limit problems. . A way of improving blood sugar control is eating a healthy diet by: o  Eating less sugar and carbohydrates o  Increasing activity/exercise o  Talking with your doctor about reaching your blood sugar goals . High blood sugars (greater than 180 mg/dL) can raise your risk of infections and slow your recovery, so you will need to focus on controlling your diabetes during the weeks before  surgery. . Make sure that the doctor who takes care of your diabetes knows about your planned surgery including the date and location.  How do I manage my blood sugar before surgery? . Check your blood sugar at least 4 times a day, starting 2 days before surgery, to make sure that the level is not too high or low. . Check your blood sugar the morning of your surgery when you wake up and every 2 hours until you get to the Short Stay unit. o If your blood sugar is less than 70 mg/dL, you will need to treat for low blood sugar: - Do not take insulin. - Treat a low blood sugar (less than 70 mg/dL) with  cup of clear juice (cranberry or apple), 4 glucose tablets, OR glucose gel. - Recheck blood sugar in 15 minutes after treatment (to make sure it is greater than 70 mg/dL). If your blood sugar is not greater than 70 mg/dL on recheck, call [phone] for further instructions. . Report your blood sugar to the short stay nurse when you get to Short Stay.  . If you are admitted to the hospital after surgery: o Your blood sugar will be checked by the staff and you will probably be given insulin after surgery (instead of oral diabetes medicines) to make sure you have good blood sugar levels. o The goal for blood sugar control after surgery is 80-180 mg/dL.     Special instructions:   - Preparing For Surgery  Before surgery, you can play an important role. Because skin is not sterile, your skin needs to be as free  of germs as possible. You can reduce the number of germs on your skin by washing with CHG (chlorahexidine gluconate) Soap before surgery.  CHG is an antiseptic cleaner which kills germs and bonds with the skin to continue killing germs even after washing.    Oral Hygiene is also important to reduce your risk of infection.  Remember - BRUSH YOUR TEETH THE MORNING OF SURGERY WITH YOUR REGULAR TOOTHPASTE  Please do not use if you have an allergy to CHG or antibacterial soaps. If  your skin becomes reddened/irritated stop using the CHG.  Do not shave (including legs and underarms) for at least 48 hours prior to first CHG shower. It is OK to shave your face.  Please follow these instructions carefully.   1. Shower the NIGHT BEFORE SURGERY and the MORNING OF SURGERY  2. If you chose to wash your hair, wash your hair first as usual with your normal shampoo.  3. After you shampoo, rinse your hair and body thoroughly to remove the shampoo.  4. Wash Face and genitals (private parts) with your normal soap.   5. Use CHG Soap as you would any other liquid soap. You can apply CHG directly to the skin and wash gently with a scrungie or a clean washcloth.   6. Apply the CHG Soap to your body ONLY FROM THE NECK DOWN.  Do not use on open wounds or open sores. Avoid contact with your eyes, ears, mouth and genitals (private parts). Wash Face and genitals (private parts)  with your normal soap.   7. Wash thoroughly, paying special attention to the area where your surgery will be performed.  8. Thoroughly rinse your body with warm water from the neck down.  9. DO NOT shower/wash with your normal soap after using and rinsing off the CHG Soap.  10. Pat yourself dry with a CLEAN TOWEL.  11. Wear CLEAN PAJAMAS to bed the night before surgery.  12. Place CLEAN SHEETS on your bed the night before your surgery.  13. DO NOT SLEEP WITH PETS.   Day of Surgery: SHOWER with CHG soap. Brush your teeth WITH YOUR REGULAR TOOTHPASTE. Wear Clean/Comfortable clothing the morning of surgery. Do not apply any deodorants/lotions.   Do not wear jewelry, make up, or nail polish. Do not shave 48 hours prior to surgery.    Do NOT Smoke (Tobacco/Vaping) or drink Alcohol 24 hours prior to your procedure. Do not bring valuables to the hospital. [facility] is not responsible for any belongings or valuables.  If you use a CPAP at night, you may bring all equipment for your overnight stay.    Contacts, glasses, or dentures may not be worn into surgery, please bring cases for these belongings.   For patients admitted to the hospital, discharge time will be determined by your treatment team.   Patients discharged the day of surgery will not be allowed to drive home, and someone needs to stay with them for 24 hours.    Please read over the following fact sheets that you were given.

## 2021-05-01 ENCOUNTER — Other Ambulatory Visit: Payer: Self-pay

## 2021-05-01 ENCOUNTER — Encounter (HOSPITAL_COMMUNITY)
Admission: RE | Admit: 2021-05-01 | Discharge: 2021-05-01 | Disposition: A | Discharge status: Home / Self Care | Payer: BC Managed Care – PPO | Source: Ambulatory Visit | Attending: Pain Medicine | Admitting: Pain Medicine

## 2021-05-01 ENCOUNTER — Encounter (HOSPITAL_COMMUNITY): Payer: Self-pay

## 2021-05-01 DIAGNOSIS — E119 Type 2 diabetes mellitus without complications: Secondary | ICD-10-CM | POA: Insufficient documentation

## 2021-05-01 DIAGNOSIS — Z981 Arthrodesis status: Secondary | ICD-10-CM | POA: Insufficient documentation

## 2021-05-01 DIAGNOSIS — K760 Fatty (change of) liver, not elsewhere classified: Secondary | ICD-10-CM | POA: Insufficient documentation

## 2021-05-01 DIAGNOSIS — M961 Postlaminectomy syndrome, not elsewhere classified: Secondary | ICD-10-CM | POA: Diagnosis present

## 2021-05-01 DIAGNOSIS — J449 Chronic obstructive pulmonary disease, unspecified: Secondary | ICD-10-CM | POA: Insufficient documentation

## 2021-05-01 DIAGNOSIS — Z01812 Encounter for preprocedural laboratory examination: Secondary | ICD-10-CM | POA: Insufficient documentation

## 2021-05-01 DIAGNOSIS — G894 Chronic pain syndrome: Secondary | ICD-10-CM | POA: Diagnosis not present

## 2021-05-01 DIAGNOSIS — Z20822 Contact with and (suspected) exposure to covid-19: Secondary | ICD-10-CM | POA: Insufficient documentation

## 2021-05-01 DIAGNOSIS — Z9981 Dependence on supplemental oxygen: Secondary | ICD-10-CM | POA: Insufficient documentation

## 2021-05-01 DIAGNOSIS — Z880 Allergy status to penicillin: Secondary | ICD-10-CM | POA: Diagnosis not present

## 2021-05-01 HISTORY — DX: Acute myocardial infarction, unspecified: I21.9

## 2021-05-01 LAB — CBC
HCT: 40.5 % (ref 36.0–46.0)
Hemoglobin: 12.8 g/dL (ref 12.0–15.0)
MCH: 29.8 pg (ref 26.0–34.0)
MCHC: 31.6 g/dL (ref 30.0–36.0)
MCV: 94.2 fL (ref 80.0–100.0)
Platelets: 191 10*3/uL (ref 150–400)
RBC: 4.3 MIL/uL (ref 3.87–5.11)
RDW: 14.8 % (ref 11.5–15.5)
WBC: 8.2 10*3/uL (ref 4.0–10.5)
nRBC: 0 % (ref 0.0–0.2)

## 2021-05-01 LAB — COMPREHENSIVE METABOLIC PANEL
ALT: 20 U/L (ref 0–44)
AST: 24 U/L (ref 15–41)
Albumin: 3.8 g/dL (ref 3.5–5.0)
Alkaline Phosphatase: 85 U/L (ref 38–126)
Anion gap: 6 (ref 5–15)
BUN: 24 mg/dL — ABNORMAL HIGH (ref 8–23)
CO2: 35 mmol/L — ABNORMAL HIGH (ref 22–32)
Calcium: 9.8 mg/dL (ref 8.9–10.3)
Chloride: 100 mmol/L (ref 98–111)
Creatinine, Ser: 1.27 mg/dL — ABNORMAL HIGH (ref 0.44–1.00)
GFR, Estimated: 48 mL/min — ABNORMAL LOW (ref >60)
Glucose, Bld: 61 mg/dL — ABNORMAL LOW (ref 70–99)
Potassium: 5 mmol/L (ref 3.5–5.1)
Sodium: 141 mmol/L (ref 135–145)
Total Bilirubin: 0.6 mg/dL (ref 0.3–1.2)
Total Protein: 7 g/dL (ref 6.5–8.1)

## 2021-05-01 LAB — HEMOGLOBIN A1C
Hgb A1c MFr Bld: 5.9 % — ABNORMAL HIGH (ref 4.8–5.6)
Mean Plasma Glucose: 122.63 mg/dL

## 2021-05-01 LAB — SURGICAL PCR SCREEN
MRSA, PCR: NEGATIVE
Staphylococcus aureus: NEGATIVE

## 2021-05-01 LAB — GLUCOSE, CAPILLARY: Glucose-Capillary: 82 mg/dL (ref 70–99)

## 2021-05-01 LAB — SARS CORONAVIRUS 2 (TAT 6-24 HRS): SARS Coronavirus 2: NEGATIVE

## 2021-05-01 NOTE — Progress Notes (Signed)
PCP - Dr. Sharion Balloon, NP Cardiologist - Dr. Enrigue Catena, MD   (both providers in [place], [place])  Chest x-ray - 10/14/20 EKG - 10/13/20 Stress Test -  ECHO - C.E 04/17/11 Cardiac Cath - C.E. 04/17/11   Sleep Study - yes CPAP - O2 3L [place] (only wears O2 at night- does not tolerate wearing CPAP mask at night)   Fasting Blood Sugar - 80s-100s Checks Blood Sugar - wears CBG monitor (currently on L arm)    COVID TEST- PAT 05/01/21   Anesthesia review: yes - pulmonary hx   Patient denies shortness of breath, fever, cough and chest pain at PAT appointment   All instructions explained to the patient, with a verbal understanding of the material. Patient agrees to go over the instructions while at home for a better understanding. Patient also instructed to self quarantine after being tested for COVID-19. The opportunity to ask questions was provided.

## 2021-05-02 ENCOUNTER — Encounter (HOSPITAL_COMMUNITY): Payer: Self-pay

## 2021-05-02 NOTE — Anesthesia Preprocedure Evaluation (Addendum)
Anesthesia Evaluation  Patient identified by MRN, date of birth, ID band Patient awake    Reviewed: Allergy & Precautions, H&P , NPO status , Patient's Chart, lab work & pertinent test results  Airway Mallampati: II   Neck ROM: full    Dental   Pulmonary sleep apnea , COPD,  oxygen dependent, former smoker,    breath sounds clear to auscultation       Cardiovascular hypertension,  Rhythm:regular Rate:Normal     Neuro/Psych  Neuromuscular disease    GI/Hepatic   Endo/Other  diabetes, Type 2  Renal/GU      Musculoskeletal   Abdominal   Peds  Hematology   Anesthesia Other Findings   Reproductive/Obstetrics                            Anesthesia Physical Anesthesia Plan  ASA: III  Anesthesia Plan: General   Post-op Pain Management:    Induction: Intravenous  PONV Risk Score and Plan: 3 and Ondansetron, Dexamethasone and Treatment may vary due to age or medical condition  Airway Management Planned: Oral ETT  Additional Equipment:   Intra-op Plan:   Post-operative Plan: Extubation in OR  Informed Consent: I have reviewed the patients History and Physical, chart, labs and discussed the procedure including the risks, benefits and alternatives for the proposed anesthesia with the patient or authorized representative who has indicated his/her understanding and acceptance.     Dental advisory given  Plan Discussed with: CRNA, Anesthesiologist and Surgeon  Anesthesia Plan Comments: (PAT note by Antionette Poles, PA-C: History of COPD, she is on 3 L of O2 at night. Per notes in care everywhere, she is supposed be on BiPAP but reportedly is not using.  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  This was diagnosed during an evaluation of acute liver injury from accidental acetaminophen overdose in April 2012 for which she was hospitalized  at [facility] and received Mucomyst therapy with recovery of acute liver injury.  S/p lumbar fusion 06/2020 complicated by infection with subsequent cleanout.  She is [facility] ID at [facility].  Last seen 02/27/2021, discussed that she was planning for spinal cord stimulator placement.  Echo 2018 showed EF 60-65%, grade 1 diastolic filling abnormality, mildly dilated LA, no valvular abnormality.  Preop labs reviewed, creatinine mildly elevated 1.27, DM2 well-controlled with A1c 5.9, otherwise unremarkable.  EKG 10/14/2020: Sinus rhythm.  Low voltage precordial leads.  Rate 65.  TTE 06/25/17 (care everywhere): Summary  1. Overall left ventricular ejection fraction is estimated at 60 to 65%.  2. Normal global left ventricular systolic function.  3. (Grade 1) Mildly abnormal left ventricular diastolic filling.  4. Mildly dilated left atrium.   Cardiac cath 04/17/11 (care everywhere): DIAGNOSTIC SUMMARY  Coronary Artery Disease   Left Main: insignificant   LAD system: insignificant   LCX system: insignificant   RCA system: insignificant   No significant CAD indicated   FINAL DIAGNOSIS  786.50 Chest pain unspecified   COMMENT  Patient transferred from OSH after thrombolysis for presumed inferior STEMI   associated with hypotension. Minor coronary disease only. Echo in the   cath lab showed normal LV systolic function. Notably the RV size and   function was normal and there was no pericardial effusion. Known to be in   renal failure prior to catheterisation and an ABG showed acidosis. The   differential diagnosis includes sepsis.  )  Anesthesia Quick Evaluation  

## 2021-05-02 NOTE — Progress Notes (Addendum)
Anesthesia Chart Review:  History of COPD, she is on 3 L of O2 at night. Per notes in care everywhere, she is supposed be on BiPAP but reportedly is not using.  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  This was diagnosed during an evaluation of acute liver injury from accidental acetaminophen overdose in April 2012 for which she was hospitalized at [facility] and received Mucomyst therapy with recovery of acute liver injury.  S/p lumbar fusion 06/2020 complicated by infection with subsequent cleanout.  She is followed by ID at [facility].  Last seen 02/27/2021, discussed that she was planning for spinal cord stimulator placement.  Echo 2018 showed EF 60-65%, grade 1 diastolic filling abnormality, mildly dilated LA, no valvular abnormality.  Preop labs reviewed, creatinine mildly elevated 1.27, DM2 well-controlled with A1c 5.9, otherwise unremarkable.  EKG 10/14/2020: Sinus rhythm.  Low voltage precordial leads.  Rate 65.  TTE 06/25/17 (care everywhere): Summary  1. Overall left ventricular ejection fraction is estimated at 60 to 65%.  2. Normal global left ventricular systolic function.  3. (Grade 1) Mildly abnormal left ventricular diastolic filling.  4. Mildly dilated left atrium.    Cardiac cath 04/17/11 (care everywhere): DIAGNOSTIC SUMMARY  Coronary Artery Disease   Left Main: insignificant   LAD system: insignificant   LCX system: insignificant   RCA system: insignificant   No significant CAD indicated   FINAL DIAGNOSIS  786.50 Chest pain unspecified   COMMENT  Patient transferred from OSH after thrombolysis for presumed inferior STEMI   associated with hypotension. Minor coronary disease only. Echo in the   cath lab showed normal LV systolic function. Notably the RV size and   function was normal and there was no pericardial effusion. Known to be in   renal failure prior to  catheterisation and an ABG showed acidosis. The   differential diagnosis includes sepsis.    Zannie Cove [facility] Short Stay Center/Anesthesiology Phone [phone] 05/02/2021 12:49 PM

## 2021-05-03 ENCOUNTER — Ambulatory Visit (HOSPITAL_COMMUNITY): Payer: BC Managed Care – PPO | Admitting: Certified Registered Nurse Anesthetist

## 2021-05-03 ENCOUNTER — Ambulatory Visit (HOSPITAL_COMMUNITY)
Admission: RE | Admit: 2021-05-03 | Discharge: 2021-05-03 | Disposition: A | Discharge status: Home / Self Care | Payer: BC Managed Care – PPO | Attending: Pain Medicine | Admitting: Pain Medicine

## 2021-05-03 ENCOUNTER — Ambulatory Visit (HOSPITAL_COMMUNITY): Payer: BC Managed Care – PPO | Admitting: Physician Assistant

## 2021-05-03 ENCOUNTER — Encounter (HOSPITAL_COMMUNITY): Payer: Self-pay | Admitting: Pain Medicine

## 2021-05-03 ENCOUNTER — Other Ambulatory Visit: Payer: Self-pay

## 2021-05-03 ENCOUNTER — Ambulatory Visit (HOSPITAL_COMMUNITY): Payer: BC Managed Care – PPO

## 2021-05-03 ENCOUNTER — Encounter (HOSPITAL_COMMUNITY)
Admission: RE | Disposition: A | Discharge status: Home / Self Care | Payer: Self-pay | Source: Home / Self Care | Attending: Pain Medicine

## 2021-05-03 DIAGNOSIS — E119 Type 2 diabetes mellitus without complications: Secondary | ICD-10-CM | POA: Insufficient documentation

## 2021-05-03 DIAGNOSIS — Z419 Encounter for procedure for purposes other than remedying health state, unspecified: Secondary | ICD-10-CM

## 2021-05-03 DIAGNOSIS — G894 Chronic pain syndrome: Secondary | ICD-10-CM | POA: Insufficient documentation

## 2021-05-03 DIAGNOSIS — M961 Postlaminectomy syndrome, not elsewhere classified: Secondary | ICD-10-CM | POA: Diagnosis not present

## 2021-05-03 DIAGNOSIS — Z20822 Contact with and (suspected) exposure to covid-19: Secondary | ICD-10-CM | POA: Insufficient documentation

## 2021-05-03 DIAGNOSIS — J449 Chronic obstructive pulmonary disease, unspecified: Secondary | ICD-10-CM | POA: Insufficient documentation

## 2021-05-03 DIAGNOSIS — Z981 Arthrodesis status: Secondary | ICD-10-CM | POA: Insufficient documentation

## 2021-05-03 DIAGNOSIS — Z880 Allergy status to penicillin: Secondary | ICD-10-CM | POA: Insufficient documentation

## 2021-05-03 HISTORY — PX: SPINAL CORD STIMULATOR INSERTION: SHX5378

## 2021-05-03 HISTORY — DX: Essential (primary) hypertension: I10

## 2021-05-03 LAB — GLUCOSE, CAPILLARY
Glucose-Capillary: 151 mg/dL — ABNORMAL HIGH (ref 70–99)
Glucose-Capillary: 122 mg/dL — ABNORMAL HIGH (ref 70–99)

## 2021-05-03 SURGERY — INSERTION, SPINAL CORD STIMULATOR, LUMBAR
Anesthesia: General | Site: Spine Lumbar

## 2021-05-03 MED ORDER — ROCURONIUM BROMIDE 10 MG/ML (PF) SYRINGE
PREFILLED_SYRINGE | INTRAVENOUS | Status: AC
  Filled 2021-05-03: qty 10

## 2021-05-03 MED ORDER — OXYCODONE HCL 5 MG/5ML PO SOLN: 5.0000 mg | Freq: Once | ORAL | Status: DC | PRN

## 2021-05-03 MED ORDER — EPHEDRINE SULFATE-NACL 50-0.9 MG/10ML-% IV SOSY
PREFILLED_SYRINGE | INTRAVENOUS | Status: DC | PRN
  Administered 2021-05-03: 15 mg via INTRAVENOUS

## 2021-05-03 MED ORDER — DOUBLE ANTIBIOTIC 500-10000 UNIT/GM EX OINT
TOPICAL_OINTMENT | CUTANEOUS | Status: AC
  Filled 2021-05-03: qty 28.4

## 2021-05-03 MED ORDER — BUPIVACAINE HCL (PF) 0.25 % IJ SOLN
INTRAMUSCULAR | Status: AC
  Filled 2021-05-03: qty 30

## 2021-05-03 MED ORDER — FENTANYL CITRATE (PF) 100 MCG/2ML IJ SOLN
INTRAMUSCULAR | Status: DC | PRN
  Administered 2021-05-03 (×2): 50 ug via INTRAVENOUS

## 2021-05-03 MED ORDER — PHENYLEPHRINE 40 MCG/ML (10ML) SYRINGE FOR IV PUSH (FOR BLOOD PRESSURE SUPPORT)
PREFILLED_SYRINGE | INTRAVENOUS | Status: DC | PRN
  Administered 2021-05-03 (×2): 120 ug via INTRAVENOUS
  Administered 2021-05-03 (×2): 80 ug via INTRAVENOUS
  Administered 2021-05-03: 160 ug via INTRAVENOUS

## 2021-05-03 MED ORDER — ONDANSETRON HCL 4 MG/2ML IJ SOLN: 4.0000 mg | Freq: Four times a day (QID) | INTRAMUSCULAR | Status: DC | PRN

## 2021-05-03 MED ORDER — ORAL CARE MOUTH RINSE: 15.0000 mL | Freq: Once | OROMUCOSAL | Status: AC

## 2021-05-03 MED ORDER — ONDANSETRON HCL 4 MG/2ML IJ SOLN
INTRAMUSCULAR | Status: AC
  Filled 2021-05-03: qty 2

## 2021-05-03 MED ORDER — ROCURONIUM BROMIDE 10 MG/ML (PF) SYRINGE
PREFILLED_SYRINGE | INTRAVENOUS | Status: DC | PRN
  Administered 2021-05-03: 50 mg via INTRAVENOUS

## 2021-05-03 MED ORDER — 0.9 % SODIUM CHLORIDE (POUR BTL) OPTIME
TOPICAL | Status: DC | PRN
  Administered 2021-05-03: 1000 mL

## 2021-05-03 MED ORDER — CHLORHEXIDINE GLUCONATE 0.12 % MT SOLN
15.0000 mL | Freq: Once | OROMUCOSAL | Status: AC
  Administered 2021-05-03: 15 mL via OROMUCOSAL
  Filled 2021-05-03: qty 15

## 2021-05-03 MED ORDER — LIDOCAINE 2% (20 MG/ML) 5 ML SYRINGE
INTRAMUSCULAR | Status: DC | PRN
  Administered 2021-05-03: 50 mg via INTRAVENOUS

## 2021-05-03 MED ORDER — EPHEDRINE 5 MG/ML INJ
INTRAVENOUS | Status: AC
  Filled 2021-05-03: qty 10

## 2021-05-03 MED ORDER — VANCOMYCIN HCL IN DEXTROSE 1-5 GM/200ML-% IV SOLN
1000.0000 mg | Freq: Once | INTRAVENOUS | Status: AC
  Administered 2021-05-03: 1000 mg via INTRAVENOUS

## 2021-05-03 MED ORDER — FENTANYL CITRATE (PF) 250 MCG/5ML IJ SOLN
INTRAMUSCULAR | Status: AC
  Filled 2021-05-03: qty 5

## 2021-05-03 MED ORDER — DEXAMETHASONE SODIUM PHOSPHATE 10 MG/ML IJ SOLN
INTRAMUSCULAR | Status: DC | PRN
  Administered 2021-05-03: 4 mg via INTRAVENOUS

## 2021-05-03 MED ORDER — MIDAZOLAM HCL 2 MG/2ML IJ SOLN
INTRAMUSCULAR | Status: AC
  Filled 2021-05-03: qty 2

## 2021-05-03 MED ORDER — LIDOCAINE 2% (20 MG/ML) 5 ML SYRINGE
INTRAMUSCULAR | Status: AC
  Filled 2021-05-03: qty 5

## 2021-05-03 MED ORDER — ONDANSETRON HCL 4 MG/2ML IJ SOLN
INTRAMUSCULAR | Status: DC | PRN
  Administered 2021-05-03: 4 mg via INTRAVENOUS

## 2021-05-03 MED ORDER — PROPOFOL 10 MG/ML IV BOLUS
INTRAVENOUS | Status: DC | PRN
  Administered 2021-05-03: 120 mg via INTRAVENOUS

## 2021-05-03 MED ORDER — MIDAZOLAM HCL 2 MG/2ML IJ SOLN
INTRAMUSCULAR | Status: DC | PRN
  Administered 2021-05-03: 2 mg via INTRAVENOUS

## 2021-05-03 MED ORDER — PROPOFOL 10 MG/ML IV BOLUS
INTRAVENOUS | Status: AC
  Filled 2021-05-03: qty 40

## 2021-05-03 MED ORDER — PHENYLEPHRINE 40 MCG/ML (10ML) SYRINGE FOR IV PUSH (FOR BLOOD PRESSURE SUPPORT)
PREFILLED_SYRINGE | INTRAVENOUS | Status: AC
  Filled 2021-05-03: qty 10

## 2021-05-03 MED ORDER — LACTATED RINGERS IV SOLN: INTRAVENOUS | Status: DC

## 2021-05-03 MED ORDER — OXYCODONE HCL 5 MG PO TABS
5.0000 mg | ORAL_TABLET | Freq: Once | ORAL | Status: DC | PRN
Start: 2021-05-03 — End: 2021-05-03

## 2021-05-03 MED ORDER — VANCOMYCIN HCL IN DEXTROSE 1-5 GM/200ML-% IV SOLN
INTRAVENOUS | Status: AC
  Filled 2021-05-03: qty 200

## 2021-05-03 MED ORDER — BUPIVACAINE HCL 0.25 % IJ SOLN
INTRAMUSCULAR | Status: DC | PRN
  Administered 2021-05-03: 10 mL

## 2021-05-03 MED ORDER — FENTANYL CITRATE (PF) 100 MCG/2ML IJ SOLN
25.0000 ug | INTRAMUSCULAR | Status: DC | PRN
  Administered 2021-05-03: 50 ug via INTRAVENOUS
  Administered 2021-05-03: 25 ug via INTRAVENOUS

## 2021-05-03 MED ORDER — FENTANYL CITRATE (PF) 100 MCG/2ML IJ SOLN
INTRAMUSCULAR | Status: AC
  Filled 2021-05-03: qty 2

## 2021-05-03 SURGICAL SUPPLY — 50 items
ADH SKN CLS APL DERMABOND .7 (GAUZE/BANDAGES/DRESSINGS) ×1
ANCH LD 4 SET SPNL CORD STM (Anchor) ×1 IMPLANT
ANCHOR CLICK (Anchor) ×1 IMPLANT
ANCHOR CLIK NEURO F/LEAD 2 (Anchor) ×1 IMPLANT
APL PRP STRL LF DISP 70% ISPRP (MISCELLANEOUS) ×1
BINDER ABDOMINAL 12 ML 46-62 (SOFTGOODS) ×2 IMPLANT
CHLORAPREP W/TINT 26 (MISCELLANEOUS) ×2 IMPLANT
CLEANER TIP ELECTROSURG 2X2 (MISCELLANEOUS) ×2 IMPLANT
CONTROL REMOTE FREELINK ALPHA (NEUROSURGERY SUPPLIES) ×2 IMPLANT
DERMABOND ADVANCED (GAUZE/BANDAGES/DRESSINGS) ×1
DERMABOND ADVANCED .7 DNX12 (GAUZE/BANDAGES/DRESSINGS) ×1 IMPLANT
DEVICE FIXATE SUTURING DUAL (MISCELLANEOUS) ×2 IMPLANT
DRAPE C-ARM 42X72 X-RAY (DRAPES) ×2 IMPLANT
DRAPE C-ARMOR (DRAPES) ×2 IMPLANT
DRAPE LAPAROTOMY 100X72X124 (DRAPES) ×2 IMPLANT
DRAPE SURG 17X23 STRL (DRAPES) ×8 IMPLANT
DRSG AQUACEL AG ADV 3.5X 4 (GAUZE/BANDAGES/DRESSINGS) ×4 IMPLANT
DRSG OPSITE POSTOP 3X4 (GAUZE/BANDAGES/DRESSINGS) IMPLANT
ELECT REM PT RETURN 9FT ADLT (ELECTROSURGICAL) ×2
ELECTRODE REM PT RTRN 9FT ADLT (ELECTROSURGICAL) ×1 IMPLANT
GLOVE BIO SURGEON STRL SZ8 (GLOVE) ×4 IMPLANT
GLOVE SRG 8 PF TXTR STRL LF DI (GLOVE) ×2 IMPLANT
GLOVE SURG UNDER POLY LF SZ8 (GLOVE) ×4
GOWN STRL REUS W/ TWL LRG LVL3 (GOWN DISPOSABLE) IMPLANT
GOWN STRL REUS W/ TWL XL LVL3 (GOWN DISPOSABLE) ×1 IMPLANT
GOWN STRL REUS W/TWL 2XL LVL3 (GOWN DISPOSABLE) ×4 IMPLANT
GOWN STRL REUS W/TWL LRG LVL3 (GOWN DISPOSABLE)
GOWN STRL REUS W/TWL XL LVL3 (GOWN DISPOSABLE) ×2
KIT BASIN OR (CUSTOM PROCEDURE TRAY) ×2 IMPLANT
KIT CHARGING (KITS) ×1
KIT CHARGING PRECISION NEURO (KITS) ×1 IMPLANT
KIT IPG ALPHA WAVEWRITER (Stimulator) ×2 IMPLANT
KIT LEAD 50CM (Lead) ×4 IMPLANT
KIT TURNOVER KIT B (KITS) ×2 IMPLANT
NEEDLE HYPO 25X1 1.5 SAFETY (NEEDLE) ×2 IMPLANT
NS IRRIG 1000ML POUR BTL (IV SOLUTION) ×2 IMPLANT
PACK LAMINECTOMY NEURO (CUSTOM PROCEDURE TRAY) ×2 IMPLANT
STAPLER SKIN PROX WIDE 3.9 (STAPLE) ×2 IMPLANT
STRIP CLOSURE SKIN 1/2X4 (GAUZE/BANDAGES/DRESSINGS) IMPLANT
SUT MNCRL AB 4-0 PS2 18 (SUTURE) IMPLANT
SUT SILK 0 (SUTURE) ×2
SUT SILK 0 MO-6 18XCR BRD 8 (SUTURE) ×1 IMPLANT
SUT VIC AB 2-0 CP2 18 (SUTURE) ×4 IMPLANT
SYR EPIDURAL 5ML GLASS (SYRINGE) ×2 IMPLANT
TOOL LONG TUNNEL (SPINAL CORD STIMULATOR) ×2 IMPLANT
TOWEL GREEN STERILE (TOWEL DISPOSABLE) ×2 IMPLANT
TOWEL GREEN STERILE FF (TOWEL DISPOSABLE) ×2 IMPLANT
WATER STERILE IRR 1000ML POUR (IV SOLUTION) ×2 IMPLANT
WRENCH HEX 7.6 (SPINAL CORD STIMULATOR) ×2 IMPLANT
YANKAUER SUCT BULB TIP NO VENT (SUCTIONS) ×2 IMPLANT

## 2021-05-03 NOTE — Op Note (Signed)
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  Chronic pain syndrome  Post-operative diagnosis: Same as above  Procedures performed: 1.  Permanent implantation of percutaneous spinal cord stimulator leads X 2 under direct flouroscopy 2.  Spinal cord stimulator battery implant (pulse generator)  Surgeon: Lorrine Kin  Procedure in detail:  Informed consent obtained.  1gram vancomycin given due to penicillin allergy.  Patient taken to operating room.  Placed under general anesthesia.  Placed prone on operating room table.  Sterile prep and drape in usual fashion.  Time out performed.  Under flouroscopy, T12-L1 interspace identified.  Overlying skin anesthetized with 71ml 0.25% bupivicaine.  Incision made.  Dissection to supraspinous ligament.  Under flouroscopic guideance with a loss of resistance to air technique, T12-L1 interspace easily accessed.  8 contact [employer] lead threaded to left of midline with most distal 2 contacts entering T8.  Second lead placed at midline in an identical fashion.  Lead placement confirmed in AP and lateral  Views.  Needles and stylettes removed.  Each lead anchored with Stay-fix device.  Final imaging noted no lead migration.  Attention directed to left flank.  An approximat 2 inch area anesthetized with 0.25% bupivicaine, 20ml.  Incision made, pocket created.  Leads tunnelled from midline to flank pocket with tunnelling device.  Strain relief loop created in midline incision.  Leads connected to generator.  All parameters appropriate.  Leads secured to generator with wrench.  Each pocket irrigate with saline.  Hemostasis achieved.  Each pocket closed with 2-0 vicryl sutures followed by staples and sterile dressings.  Complications: none  Fluids: per anesthesia record.  Estimated blood loss: minimal  Implants:    1.  [employer] percutaneous leads X 2.  Wave [employer]urist)  Disposition:  Recovery room, then home.  Clinic follow up 10 days for dressing removal.

## 2021-05-03 NOTE — Transfer of Care (Signed)
Immediate Anesthesia Transfer of Care Note  Patient: Brenda Morgan  Procedure(s) Performed: Spinal Cord Stimulator Placement (N/A Spine Lumbar)  Patient Location: PACU  Anesthesia Type:General  Level of Consciousness: awake, alert  and oriented  Airway & Oxygen Therapy: Patient Spontanous Breathing and Patient connected to nasal cannula oxygen  Post-op Assessment: Report given to RN, Post -op Vital signs reviewed and stable and Patient moving all extremities  Post vital signs: Reviewed and stable  Last Vitals:  Vitals Value Taken Time  BP 102/52 05/03/21 1024  Temp 36.2 C 05/03/21 1024  Pulse 67 05/03/21 1032  Resp 16 05/03/21 1032  SpO2 96 % 05/03/21 1032  Vitals shown include unvalidated device data.  Last Pain:  Vitals:   05/03/21 1024  TempSrc:   PainSc: Asleep      Patients Stated Pain Goal: 4 (05/03/21 0740)  Complications: No complications documented.

## 2021-05-03 NOTE — Anesthesia Procedure Notes (Signed)
Procedure Name: Intubation Date/Time: 05/03/2021 8:44 AM Performed by: Leonor Liv, CRNA Pre-anesthesia Checklist: Patient identified, Emergency Drugs available, Suction available and Patient being monitored Patient Re-evaluated:Patient Re-evaluated prior to induction Oxygen Delivery Method: Circle System Utilized Preoxygenation: Pre-oxygenation with 100% oxygen Induction Type: IV induction Ventilation: Mask ventilation without difficulty Laryngoscope Size: Mac and 3 Grade View: Grade I Tube type: Oral Tube size: 7.0 mm Number of attempts: 1 Airway Equipment and Method: Stylet and Oral airway Placement Confirmation: ETT inserted through vocal cords under direct vision,  positive ETCO2 and breath sounds checked- equal and bilateral Tube secured with: Tape Dental Injury: Teeth and Oropharynx as per pre-operative assessment

## 2021-05-04 ENCOUNTER — Encounter (HOSPITAL_COMMUNITY): Payer: Self-pay | Admitting: Pain Medicine

## 2021-05-04 NOTE — H&P (Signed)
Patient presents for permanent SCS implant.  PMH DM COPD Lumbar radiculopathy H/o lumbar spine surgery   Meds EMR list reviewed    All Reviewed - PCN allergy noted  ROS: Neg except as noted  PE: CV: regular pulse Pulm: unlabored breathing MSK: good strength LE B Abd: soft Neuro: CN II-XII grossly intact Gen: NAD  A/P H/o Failed back surgery syndrome, here for SCS implant.  1.  Implant today 2.  10 day f/u for dressing and staple removal.

## 2021-05-04 NOTE — Anesthesia Postprocedure Evaluation (Signed)
Anesthesia Post Note  Patient: Lexicographer  Procedure(s) Performed: Spinal Cord Stimulator Placement (N/A Spine Lumbar)     Patient location during evaluation: PACU Anesthesia Type: General Level of consciousness: awake and alert Pain management: pain level controlled Vital Signs Assessment: post-procedure vital signs reviewed and stable Respiratory status: spontaneous breathing, nonlabored ventilation, respiratory function stable and patient connected to nasal cannula oxygen Cardiovascular status: blood pressure returned to baseline and stable Postop Assessment: no apparent nausea or vomiting Anesthetic complications: no   No complications documented.  Last Vitals:  Vitals:   05/03/21 1054 05/03/21 1110  BP: 109/78 110/69  Pulse: 67 64  Resp: 19 15  Temp:  36.6 C  SpO2: 97% 100%    Last Pain:  Vitals:   05/03/21 1110  TempSrc:   PainSc: 3                  Nola Botkins S

## 2021-05-06 ENCOUNTER — Emergency Department (HOSPITAL_COMMUNITY): Payer: BC Managed Care – PPO

## 2021-05-06 ENCOUNTER — Other Ambulatory Visit: Payer: Self-pay

## 2021-05-06 ENCOUNTER — Emergency Department (HOSPITAL_COMMUNITY)
Admission: EM | Admit: 2021-05-06 | Discharge: 2021-05-06 | Disposition: A | Discharge status: Home / Self Care | Payer: BC Managed Care – PPO | Attending: Emergency Medicine | Admitting: Emergency Medicine

## 2021-05-06 DIAGNOSIS — E119 Type 2 diabetes mellitus without complications: Secondary | ICD-10-CM | POA: Insufficient documentation

## 2021-05-06 DIAGNOSIS — I10 Essential (primary) hypertension: Secondary | ICD-10-CM | POA: Insufficient documentation

## 2021-05-06 DIAGNOSIS — M5442 Lumbago with sciatica, left side: Secondary | ICD-10-CM | POA: Diagnosis present

## 2021-05-06 DIAGNOSIS — Z7984 Long term (current) use of oral hypoglycemic drugs: Secondary | ICD-10-CM | POA: Insufficient documentation

## 2021-05-06 DIAGNOSIS — Z87891 Personal history of nicotine dependence: Secondary | ICD-10-CM | POA: Insufficient documentation

## 2021-05-06 DIAGNOSIS — R509 Fever, unspecified: Secondary | ICD-10-CM | POA: Diagnosis not present

## 2021-05-06 DIAGNOSIS — J449 Chronic obstructive pulmonary disease, unspecified: Secondary | ICD-10-CM | POA: Insufficient documentation

## 2021-05-06 DIAGNOSIS — M5416 Radiculopathy, lumbar region: Secondary | ICD-10-CM | POA: Diagnosis not present

## 2021-05-06 DIAGNOSIS — Z79899 Other long term (current) drug therapy: Secondary | ICD-10-CM | POA: Diagnosis not present

## 2021-05-06 DIAGNOSIS — Z7952 Long term (current) use of systemic steroids: Secondary | ICD-10-CM | POA: Diagnosis not present

## 2021-05-06 LAB — CBC WITH DIFFERENTIAL/PLATELET
Abs Immature Granulocytes: 0.21 10*3/uL — ABNORMAL HIGH (ref 0.00–0.07)
Basophils Absolute: 0.1 10*3/uL (ref 0.0–0.1)
Basophils Relative: 1 %
Eosinophils Absolute: 0.1 10*3/uL (ref 0.0–0.5)
Eosinophils Relative: 1 %
HCT: 36.4 % (ref 36.0–46.0)
Hemoglobin: 11.5 g/dL — ABNORMAL LOW (ref 12.0–15.0)
Immature Granulocytes: 2 %
Lymphocytes Relative: 13 %
Lymphs Abs: 1.6 10*3/uL (ref 0.7–4.0)
MCH: 30.1 pg (ref 26.0–34.0)
MCHC: 31.6 g/dL (ref 30.0–36.0)
MCV: 95.3 fL (ref 80.0–100.0)
Monocytes Absolute: 1.1 10*3/uL — ABNORMAL HIGH (ref 0.1–1.0)
Monocytes Relative: 9 %
Neutro Abs: 8.9 10*3/uL — ABNORMAL HIGH (ref 1.7–7.7)
Neutrophils Relative %: 74 %
Platelets: 188 10*3/uL (ref 150–400)
RBC: 3.82 MIL/uL — ABNORMAL LOW (ref 3.87–5.11)
RDW: 14.5 % (ref 11.5–15.5)
WBC: 11.9 10*3/uL — ABNORMAL HIGH (ref 4.0–10.5)
nRBC: 0 % (ref 0.0–0.2)

## 2021-05-06 LAB — COMPREHENSIVE METABOLIC PANEL
ALT: 20 U/L (ref 0–44)
AST: 24 U/L (ref 15–41)
Albumin: 3.1 g/dL — ABNORMAL LOW (ref 3.5–5.0)
Alkaline Phosphatase: 123 U/L (ref 38–126)
Anion gap: 8 (ref 5–15)
BUN: 21 mg/dL (ref 8–23)
CO2: 30 mmol/L (ref 22–32)
Calcium: 9.2 mg/dL (ref 8.9–10.3)
Chloride: 99 mmol/L (ref 98–111)
Creatinine, Ser: 1.09 mg/dL — ABNORMAL HIGH (ref 0.44–1.00)
GFR, Estimated: 57 mL/min — ABNORMAL LOW (ref >60)
Glucose, Bld: 135 mg/dL — ABNORMAL HIGH (ref 70–99)
Potassium: 4.2 mmol/L (ref 3.5–5.1)
Sodium: 137 mmol/L (ref 135–145)
Total Bilirubin: 0.9 mg/dL (ref 0.3–1.2)
Total Protein: 6.9 g/dL (ref 6.5–8.1)

## 2021-05-06 LAB — URINALYSIS, ROUTINE W REFLEX MICROSCOPIC
Bilirubin Urine: NEGATIVE
Glucose, UA: 50 mg/dL — AB
Hgb urine dipstick: NEGATIVE
Ketones, ur: 5 mg/dL — AB
Leukocytes,Ua: NEGATIVE
Nitrite: NEGATIVE
Protein, ur: NEGATIVE mg/dL
Specific Gravity, Urine: 1.016 (ref 1.005–1.030)
pH: 6 (ref 5.0–8.0)

## 2021-05-06 LAB — LACTIC ACID, PLASMA: Lactic Acid, Venous: 0.9 mmol/L (ref 0.5–1.9)

## 2021-05-06 MED ORDER — MORPHINE SULFATE (PF) 4 MG/ML IV SOLN
4.0000 mg | Freq: Once | INTRAVENOUS | Status: AC
  Administered 2021-05-06: 4 mg via INTRAVENOUS
  Filled 2021-05-06: qty 1

## 2021-05-06 NOTE — ED Triage Notes (Signed)
Pt had spinal cord stimulator placed by Dr Lorrine Kin on 5/11. Pt arrives with continued/worsening lower back pain since the surgery. No meds PTA.

## 2021-05-06 NOTE — ED Notes (Signed)
Patient transported to CT 

## 2021-05-06 NOTE — Discharge Instructions (Addendum)
Your history and exam, work-up today did not reveal a clear source of the fever you had yesterday and the worsen back pain.  We had multiple discussions with neurosurgery about the CT scan results and they have a low suspicion based on the appearance that this is an infection of the surgical area.  Your white blood cell count was slightly elevated with a lactic acid was normal and you were not febrile here.  The urinalysis does not show infection.  As we discussed, please follow-up with your neurosurgeon this week and rest and stay hydrated.  If any symptoms change or worsen acutely, please return to the nearest emergency department.  Please call the [employer] representative to help get your stimulator working effectively and properly.  The representative I spoke with was Janeal Holmes at [phone].  If any symptoms change or worsen, please return.

## 2021-05-06 NOTE — ED Provider Notes (Signed)
With the boss [facility] EMERGENCY DEPARTMENT Provider Note   CSN: [id number] Arrival date & time: 05/06/21  1057     History Chief Complaint  Patient presents with  . Back Pain    Chrisanne Loose is a 65 y.o. female.  The history is provided by the patient and medical records. No language interpreter was used.  Back Pain Location:  Lumbar spine Quality:  Stabbing Radiates to:  L posterior upper leg Pain severity:  Severe Pain is:  Unable to specify Onset quality:  Gradual Duration:  3 days Timing:  Constant Progression:  Worsening Chronicity:  New Relieved by:  Nothing Worsened by:  Palpation Ineffective treatments:  None tried Associated symptoms: fever and leg pain   Associated symptoms: no abdominal pain, no abdominal swelling, no bladder incontinence, no bowel incontinence, no chest pain, no dysuria, no headaches, no numbness, no paresthesias, no tingling and no weakness   Risk factors: recent surgery        Past Medical History:  Diagnosis Date  . COPD (chronic obstructive pulmonary disease) (HCC)   . Diabetes mellitus without complication (HCC)   . Fatty liver 2012   per pt 06/24/20  . Hypertension   . Sleep apnea     Patient Active Problem List   Diagnosis Date Noted  . Venous stasis dermatitis of both lower extremities 12/21/2020  . AKI (acute kidney injury) (HCC) 09/15/2020  . Infection of lumbar spine (HCC) 08/02/2020  . Hardware complicating wound infection (HCC) 08/02/2020  . COPD (chronic obstructive pulmonary disease) (HCC) 08/01/2020  . DM II (diabetes mellitus, type II), controlled (HCC) 08/01/2020  . HTN (hypertension) 08/01/2020  . Postoperative wound infection 07/31/2020  . Lumbar radiculopathy 07/06/2020  . Lumbar spinal stenosis 06/28/2020    Past Surgical History:  Procedure Laterality Date  . BACK SURGERY    . carotid artery surgery Right 10/07/2012  . carotid ultrasound  06/2015  . CHOLECYSTECTOMY  1981  .  COLONOSCOPY  2014  . DIAGNOSTIC MAMMOGRAM  03/2017  . groin lypnoid Left 03/2015  . LUMBAR WOUND DEBRIDEMENT N/A 08/02/2020   Procedure: IRRIGATE AND DEBRIDEMENT OF LUMBAR WOUND, PLACEMENT OF WOUND VAC;  Surgeon: Dawley, Alan Mulder, DO;  Location: [facility] OR;  Service: Neurosurgery;  Laterality: N/A;  . right heel spur  1994  . SPINAL CORD STIMULATOR INSERTION N/A 05/03/2021   Procedure: Spinal Cord Stimulator Placement;  Surgeon: Renaldo Fiddler, MD;  Location: [facility] OR;  Service: Neurosurgery;  Laterality: N/A;  . TUBAL LIGATION  1985  . wrist trigger release Right 01/2016     OB History   No obstetric history on file.     Family History  Problem Relation Age of Onset  . Diabetes Mother   . Heart disease Mother   . Stomach cancer Mother   . Diabetes Father   . Heart disease Father   . Lung cancer Father     Social History   Tobacco Use  . Smoking status: Former Smoker    Packs/day: 1.00    Types: Cigarettes    Quit date: 04/23/2021    Years since quitting: 0.0  . Smokeless tobacco: Never Used  . Tobacco comment: not thinking about it right now.  Substance Use Topics  . Alcohol use: Not Currently  . Drug use: Not Currently    Home Medications Prior to Admission medications   Medication Sig Start Date End Date Taking? Authorizing Provider  atorvastatin (LIPITOR) 10 MG tablet Take 5 mg by mouth  daily.    [provider]  azelastine (ASTELIN) 0.1 % nasal spray Place 2 sprays into both nostrils 2 (two) times daily as needed for rhinitis (congestion).  07/30/20   [provider]  Biotin w/ Vitamins C & E (HAIR/SKIN/NAILS PO) Take 3 capsules by mouth daily. With Joint support    [provider]  carvedilol (COREG) 25 MG tablet Take 25 mg by mouth daily.    [provider]  cetirizine (ZYRTEC) 10 MG tablet Take 10 mg by mouth daily.    [provider]  Cholecalciferol (VITAMIN D) 50 MCG (2000 UT) tablet Take 2,000 Units by mouth daily.     [provider]  diclofenac Sodium (VOLTAREN) 1 % GEL Apply 4 g topically 4 (four) times daily. Patient taking differently: Apply 4 g topically 4 (four) times daily as needed (pain). 09/23/20   Melene Plan, DO  doxycycline (VIBRA-TABS) 100 MG tablet Take 1 tablet (100 mg total) by mouth 2 (two) times daily. 11/07/20   Blanchard Kelch, NP  DULoxetine (CYMBALTA) 30 MG capsule Take 30 mg by mouth daily.    [provider]  Empagliflozin-metFORMIN HCl ER (SYNJARDY XR) 25-1000 MG TB24 Take 1 tablet by mouth daily.    [provider]  famotidine (PEPCID) 40 MG tablet Take 40 mg by mouth at bedtime. 09/09/20   [provider]  fentaNYL (DURAGESIC) 50 MCG/HR Place 1 patch onto the skin every 3 (three) days.    [provider]  ferrous sulfate 325 (65 FE) MG tablet Take 325 mg by mouth daily with breakfast.    [provider]  Fluticasone-Salmeterol (ADVAIR) 250-50 MCG/DOSE AEPB Inhale 1 puff into the lungs 2 (two) times daily as needed (shortness of breath/wheezing).     [provider]  furosemide (LASIX) 40 MG tablet Take 40 mg by mouth daily.    [provider]  hydrOXYzine (ATARAX/VISTARIL) 25 MG tablet Take 12.5 mg by mouth at bedtime. 07/30/20   [provider]  icosapent Ethyl (VASCEPA) 1 g capsule Take 1 g by mouth 2 (two) times daily.    [provider]  ipratropium-albuterol (DUONEB) 0.5-2.5 (3) MG/3ML SOLN Take 3 mLs by nebulization every 6 (six) hours as needed (shortness of breath/wheezing/asthma).     [provider]  lactulose (CHRONULAC) 10 GM/15ML solution Take 10 g by mouth 3 (three) times daily as needed (constipation).     [provider]  lamoTRIgine (LAMICTAL) 150 MG tablet Take 150 mg by mouth See admin instructions. Take one tablet (150 mg) by mouth every morning with a 25 mg tablet for a total dose of 175 mg daily    [provider]  lamoTRIgine (LAMICTAL) 25 MG tablet  Take 25 mg by mouth See admin instructions. Take one tablet (25 mg) by mouth every morning with a 150 mg tablet for a total dose of 175 mg daily 07/21/20   [provider]  losartan (COZAAR) 25 MG tablet Take 25 mg by mouth daily. 09/28/20   [provider]  magnesium oxide (MAG-OX) 400 MG tablet Take 400 mg by mouth daily.    [provider]  metolazone (ZAROXOLYN) 5 MG tablet Take 5 mg by mouth 3 (three) times a week.    [provider]  morphine (MSIR) 15 MG tablet Take 0.5 tablets (7.5 mg total) by mouth every 4 (four) hours as needed for severe pain. Patient not taking: Reported on 04/25/2021 09/23/20   Melene Plan, DO  oxyCODONE (  OXY IR/ROXICODONE) 5 MG immediate release tablet Take 5 mg by mouth 4 (four) times daily as needed for moderate pain.  08/08/20   [provider]  OXYGEN Inhale 3 L into the lungs at bedtime. Use with BIPAP    [provider]  OZEMPIC, 1 MG/DOSE, 4 MG/3ML SOPN Inject 1 mg into the skin once a week. Take on Sunday 09/14/20   [provider]  pantoprazole (PROTONIX) 40 MG tablet Take 40 mg by mouth daily.    [provider]  potassium chloride SA (KLOR-CON) 20 MEQ tablet Take 20 mEq by mouth daily.    [provider]  pramipexole (MIRAPEX) 0.5 MG tablet Take 1 mg by mouth at bedtime.    [provider]  pregabalin (LYRICA) 200 MG capsule Take 200 mg by mouth in the morning, at noon, and at bedtime.    [provider]  silver sulfADIAZINE (SILVADENE) 1 % cream Apply 1 application topically daily as needed (leg sores).     [provider]  spironolactone (ALDACTONE) 50 MG tablet Take 50 mg by mouth daily.    [provider]  traZODone (DESYREL) 100 MG tablet Take 100 mg by mouth at bedtime.    [provider]    Allergies    Amlodipine, Naltrexone, Penicillins, and Penicillin g  Review of Systems   Review of Systems  Constitutional: Positive for  chills and fever. Negative for diaphoresis and fatigue.  HENT: Negative for congestion.   Eyes: Negative for visual disturbance.  Respiratory: Negative for cough, chest tightness, shortness of breath and wheezing.   Cardiovascular: Negative for chest pain, palpitations and leg swelling.  Gastrointestinal: Negative for abdominal pain, bowel incontinence, constipation, diarrhea, nausea and vomiting.  Genitourinary: Positive for frequency. Negative for bladder incontinence, dysuria and flank pain.  Musculoskeletal: Positive for back pain. Negative for neck pain and neck stiffness.  Neurological: Negative for tingling, weakness, light-headedness, numbness, headaches and paresthesias.  Psychiatric/Behavioral: Negative for agitation.  All other systems reviewed and are negative.   Physical Exam Updated Vital Signs BP 128/75 (BP Location: Left Arm)   Pulse 83   Temp 98 F (36.7 C)   Resp 16   SpO2 94%   Physical Exam Vitals and nursing note reviewed.  Constitutional:      General: She is not in acute distress.    Appearance: She is well-developed. She is not ill-appearing, toxic-appearing or diaphoretic.  HENT:     Head: Normocephalic and atraumatic.     Nose: No congestion or rhinorrhea.  Eyes:     Conjunctiva/sclera: Conjunctivae normal.  Cardiovascular:     Rate and Rhythm: Normal rate and regular rhythm.     Heart sounds: No murmur heard.   Pulmonary:     Effort: Pulmonary effort is normal. No respiratory distress.     Breath sounds: Normal breath sounds. No wheezing, rhonchi or rales.  Chest:     Chest wall: No tenderness.  Abdominal:     General: Abdomen is flat.     Palpations: Abdomen is soft.     Tenderness: There is no abdominal tenderness. There is no right CVA tenderness, left CVA tenderness, guarding or rebound.  Musculoskeletal:        General: Tenderness present. No deformity.     Cervical back: Neck supple. No tenderness.     Right lower leg: No edema.      Left lower leg: No edema.  Skin:    General: Skin is warm and dry.  Capillary Refill: Capillary refill takes less than 2 seconds.     Findings: Erythema present.  Neurological:     General: No focal deficit present.     Mental Status: She is alert.     Sensory: No sensory deficit.     Motor: No weakness.  Psychiatric:        Mood and Affect: Mood normal.     ED Results / Procedures / Treatments   Labs (all labs ordered are listed, but only abnormal results are displayed) Labs Reviewed  CBC WITH DIFFERENTIAL/PLATELET - Abnormal; Notable for the following components:      Result Value   WBC 11.9 (*)    RBC 3.82 (*)    Hemoglobin 11.5 (*)    Neutro Abs 8.9 (*)    Monocytes Absolute 1.1 (*)    Abs Immature Granulocytes 0.21 (*)    All other components within normal limits  COMPREHENSIVE METABOLIC PANEL - Abnormal; Notable for the following components:   Glucose, Bld 135 (*)    Creatinine, Ser 1.09 (*)    Albumin 3.1 (*)    GFR, Estimated 57 (*)    All other components within normal limits  URINALYSIS, ROUTINE W REFLEX MICROSCOPIC - Abnormal; Notable for the following components:   Glucose, UA 50 (*)    Ketones, ur 5 (*)    All other components within normal limits  URINE CULTURE  CULTURE, BLOOD (ROUTINE X 2)  CULTURE, BLOOD (ROUTINE X 2)  LACTIC ACID, PLASMA    EKG None  Radiology CT Lumbar Spine Wo Contrast  Result Date: 05/06/2021 CLINICAL DATA:  Low back pain, prior surgery, new symptoms; recent spinal stimulator placement, fevers, chills, worsened back pain. EXAM: CT LUMBAR SPINE WITHOUT CONTRAST TECHNIQUE: Multidetector CT imaging of the lumbar spine was performed without intravenous contrast administration. Multiplanar CT image reconstructions were also generated. COMPARISON:  Lumbar spine MRI 07/31/2020. Radiographs of the thoracolumbar spine 03/13/2021. FINDINGS: Segmentation: 5 lumbar vertebrae. The caudal most well-formed intervertebral disc space is  designated L5-S1. Alignment: Lumbar levocurvature. 4 mm L4-L5 grade 1 anterolisthesis. This may be slightly progressed as compared to the MRI of 07/31/2020. Vertebrae: Redemonstrated chronic T12 vertebral compression fracture (60% height loss anteriorly. Redemonstrated moderate chronic L4 superior endplate compression fracture. Redemonstrated posterior spinal fusion construct at L4-L5. No evidence of acute hardware compromise. Redemonstrated L4-L5 interbody device. 8 mm sclerotic lesion within the right sacral ala, nonspecific but likely benign. Paraspinal and other soft tissues: No acute abnormality within the visualized abdomen/retroperitoneum. Aortoiliac atherosclerosis. There is a small amount of gas surrounding spinal cord stimulator leads within the dorsal soft tissues at the L1-L3 levels. A small fluid collection is also present within the dorsal lumbar soft tissues, overlying the L3 level, measuring 2.6 x 2.6 cm in transaxial dimensions (series 3, image 58). The spinal cord stimulator leads enter the dorsal aspect of the spinal canal at the T12-L1 level. Disc levels: No more than mild disc degeneration at the non operative levels. T11-T12: Mild bony retropulsion at the level of the T12 superior endplate. Disc bulge. No appreciable spinal canal stenosis. The neural foramina are incompletely imaged. T12-L1: Small disc bulge. No appreciable significant spinal canal stenosis or neural foraminal narrowing. L1-L2: Small disc bulge. No appreciable significant spinal canal or neural foraminal stenosis. L2-L3: No significant disc herniation or stenosis. L3-L4: Disc bulge with endplate spurring bilateral neural foraminal. Facet arthrosis/ligamentum flavum hypertrophy. Apparent mild bilateral subarticular and central canal narrowing. Suspect moderate bilateral neural foraminal narrowing. L4-L5: The spinal  canal and neural foramina are obscured by streak and beam hardening artifact arising from a posterior spinal fusion  construct. L5-S1: Disc bulge. Facet arthrosis/ligamentum flavum hypertrophy. Apparent mild bilateral subarticular and central canal stenosis. Streak and beam hardening artifact limits evaluation of the neural foramina. Suspect at least moderate bilateral foraminal stenosis. IMPRESSION: Small amount of subcutaneous gas surrounding spinal cord stimulator leads within the dorsal lumbar soft tissues at the L1-L3 levels. Additionally, there is a small adjacent fluid collection overlying the lumbar spine dorsally at the L3 level, measuring 2.6 x 2.6 cm in transaxial dimensions. This may reflect a postprocedural seroma. Abscess cannot be excluded given the provided history. Redemonstrated chronic T12 and L4 vertebral compression fracture deformities. Redemonstrated posterior spinal fusion construct at L4-L5 (and presumed prior posterior decompression, although this is obscured by streak and beam hardening artifact). Artifact precludes adequate evaluation of the spinal canal and neural foramina at this level. 4 mm L4-L5 grade 1 anterolisthesis may have slightly progressed from the lumbar spine MRI of 07/31/2020. Lumbar and lower thoracic spondylosis, as described. No more than mild suspected spinal canal stenosis at the remaining visible levels. Multilevel neural foraminal narrowing, as detailed. Electronically Signed   By: Jackey LogeKyle  Golden DO   On: 05/06/2021 14:11    Procedures Procedures   Medications Ordered in ED Medications  morphine 4 MG/ML injection 4 mg (4 mg Intravenous Given 05/06/21 1409)    ED Course  I have reviewed the triage vital signs and the nursing notes.  Pertinent labs & imaging results that were available during my care of the patient were reviewed by me and considered in my medical decision making (see chart for details).    MDM Rules/Calculators/A&P                          Nicholaus CorollaVenetia Rua is a 65 y.o. female with a past medical history significant for diabetes, hypertension, COPD,  prior spinal infection, and recent spinal stimulator placed 3 days ago by Dr. Lorrine KinEichman who presents with fever of 101 yesterday, chills, worsening back pain, and some darkened urine.  Patient reports that she had surgery several days ago and since discharge has had worsening back pain.  Family reports they did not bring the remote for the stimulator and they are unsure if it ever was even turned on.  She reports that her discomfort is a 9 out of 10 in severity and is primarily in her low back.  She reports it does radiate down her left leg which is similar to the pain she had 3 surgery.  She denies any new numbness or weakness.  Denies loss of bowel or bladder control.  She does report she has had increase in urination and had a darkened urine that looks orange at times.  She denies dysuria however.  She reports fever yesterday of 101 measured orally and she has felt chills.  She denies any chest pain, shortness breath, or cough.  Denies abdominal pain nausea or vomiting.  Denies other complaints aside from the worsening pain.  On exam, patient does have tenderness around her postoperative site but I do not see any drainage or purulence.  The adhesive bandages still in place.  Patient does have warmth all over her body as well as some mild erythema diffusely.  Abdomen nontender.  Patient had symmetric strength and sensation in legs.  Good pulses in legs.  Clinically I do feel need to touch base with neurosurgery to discuss  what imaging would be most beneficial for this patient.  Cannot rule out postoperative infection or complication at this time.  Will get screening labs, talk with neurosurgery to discuss imaging options, and will also get urinalysis given the urine changes and her fevers.  We will get a rectal temperature given the warmth on exam.  Will give pain medicine.  Anticipate reassessment after discussion with neurosurgery and work-up is completed.  12:48 PM Just spoke with neurosurgery team and  they recommend having the patient call the [facility] Scientific representative who deals with spinal stimulators to have them come assess and determine if the stimulator is on and functioning correctly.  Patient did not have the number to call so secretary will try to get in touch with this representative.  Neurosurgery agreed with CT scan and did not feel that contrast was needed given the ongoing contrast scarcity.  We will get rest of work-up completed and determine disposition  3:13 PM I spoke with Janeal Holmes who is the [employer] rep who reports that the patient did have a spinal stimulator placed several days ago.  The patient will need the stimulator remote from home in order to titrate or adjust the control and settings up if the pain is worsened.  She reports that universally they leave the stimulator on after any procedure and the patient can then turn it up as needed.  Enrique Sack North's cell phone is [phone].  She said she would be available later this evening if she needs to come in with a computer to assess or adjust the stimulator if the patient is unable to get the remote and there is no other plans in place.  Work-up is begun to return and she does have a leukocytosis of 11.9.  CT scan returns showing a small amount of subcutaneous gas near the spinal cord stimulator leads as well as an adjacent 2.6 x 2.6 cm fluid collection that may reflect a postprocedural seroma versus abscess which cannot be excluded.  No other acute abnormality seen.  Still waiting on urinalysis, after urine, we will touch base again with neurosurgery to discuss if patient will need any further imaging or procedure or management at this time.  8:32 PM  Spoke again with neurosurgery who reviewed the images.  They do not suspect this is an abscess.  Given the patient's normal lactic acid, lack of fever here, and otherwise well appearance, both the neurosurgery team and patient now agree with discharge home.   Patient's urinalysis does not show UTI.  Unclear source of the fever but patient is feeling somewhat better.  Plans to go home and scientific representative to help troubleshoot the stimulator to try to help with the pain.  She will follow-up with Dr. Lorrine Kin this week and understands extremely strict return precautions.  Patient and family have no other questions or concerns and patient discharged in good condition with improving symptoms.   Final Clinical Impression(s) / ED Diagnoses Final diagnoses:  Acute low back pain with left-sided sciatica, unspecified back pain laterality  Recent unexplained fever    Rx / DC Orders ED Discharge Orders    None     Clinical Impression: 1. Acute low back pain with left-sided sciatica, unspecified back pain laterality   2. Recent unexplained fever     Disposition: Discharge  Condition: Good  I have discussed the results, Dx and Tx plan with the pt(& family if present). He/she/they expressed understanding and agree(s) with the plan. Discharge instructions discussed  at great length. Strict return precautions discussed and pt &/or family have verbalized understanding of the instructions. No further questions at time of discharge.    New Prescriptions   No medications on file    Follow Up: Renaldo Fiddler, MD [address] [place] [address] [phone]     [facility] EMERGENCY DEPARTMENT [address] [id number] mc [place] [address] [phone]       Franchesca Veneziano, Canary Brim, MD 05/06/21 2156

## 2021-05-11 LAB — CULTURE, BLOOD (ROUTINE X 2)
Culture: NO GROWTH
Culture: NO GROWTH

## 2021-07-09 IMAGING — RF DG C-ARM 1-60 MIN
1 series · 3 of 3 positions shown · non-contrast
Comparison: Radiographs of the thoracic spine 03/13/2021.

CLINICAL DATA: Elective surgery. Additional history provided:
Spinal cord stimulator, permanent implant. Provided fluoroscopy time
47.3 seconds (23.33 mGy).

EXAM:
THORACOLUMBAR SPINE 1V

[Series 1: run · 3 of 3 slices shown]
[im 1/3]
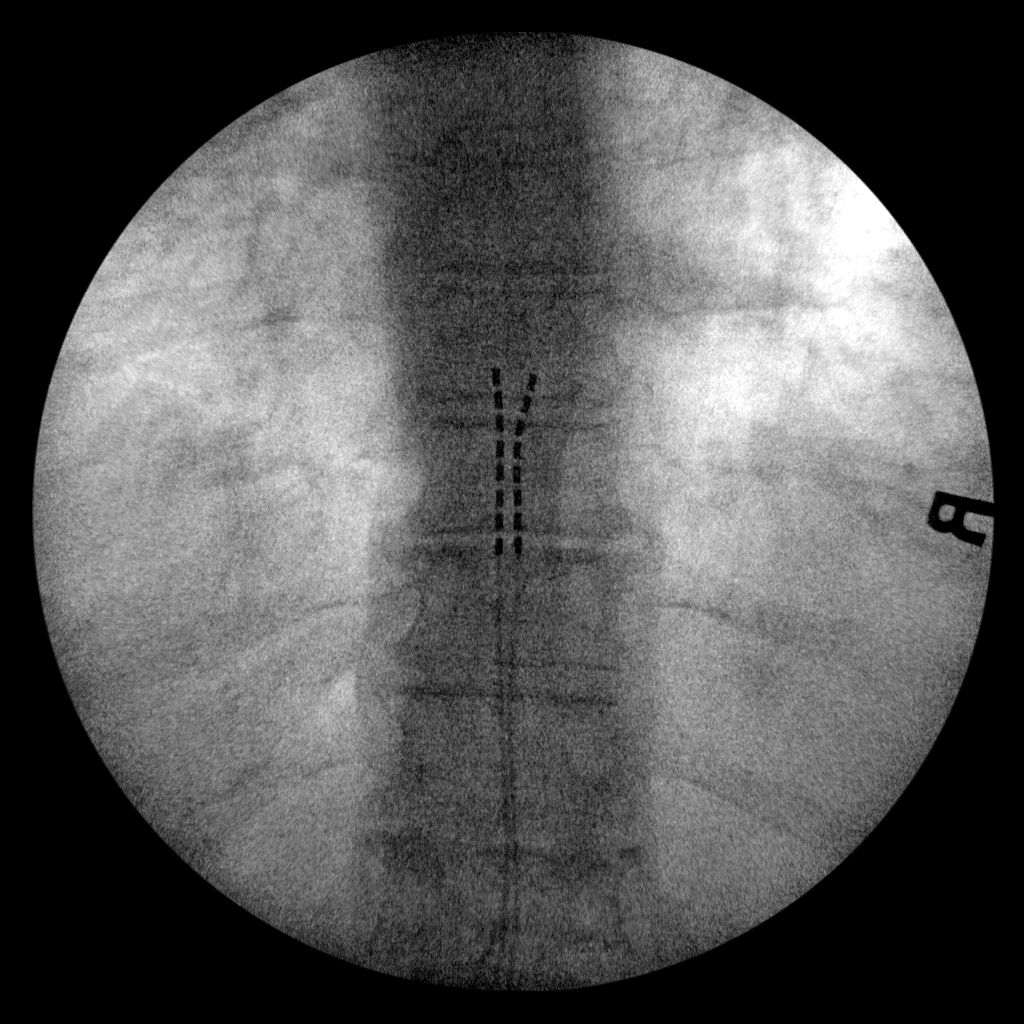
[im 2/3]
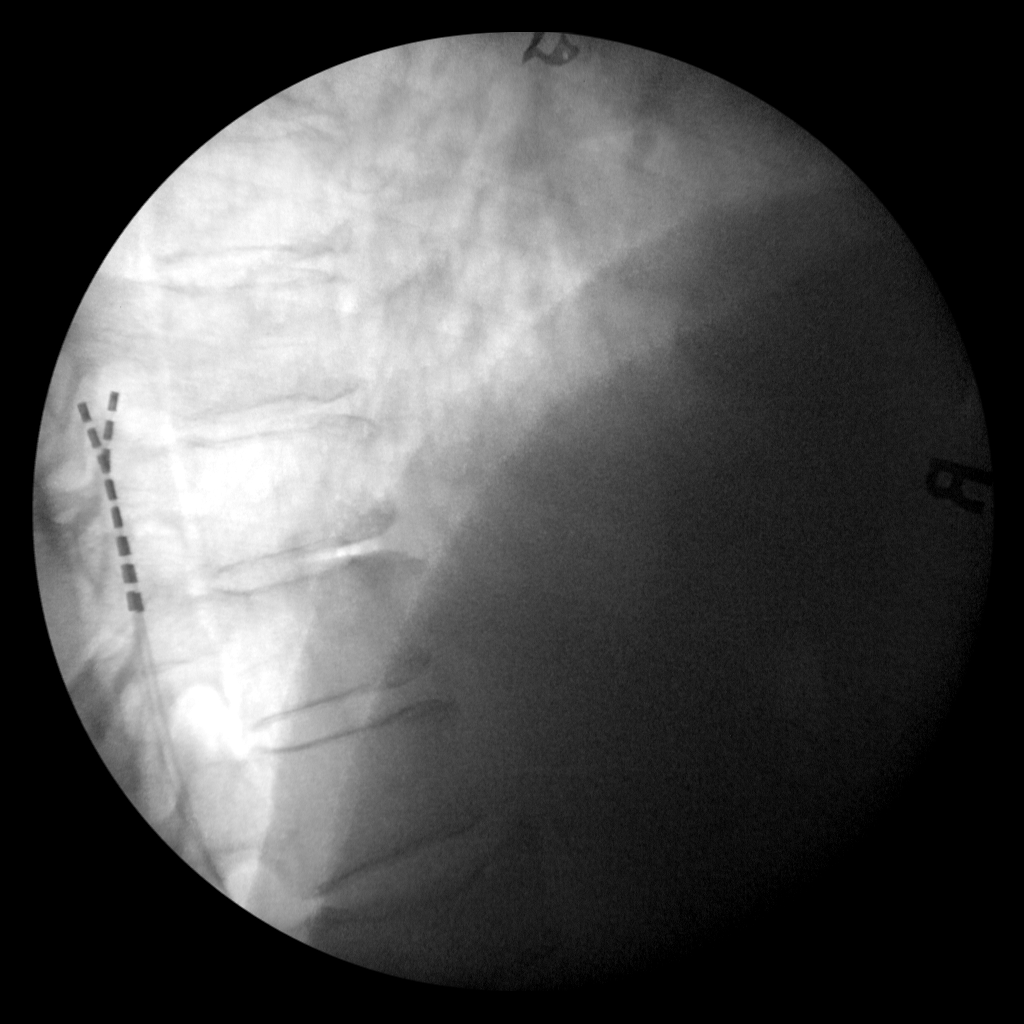
[im 3/3]
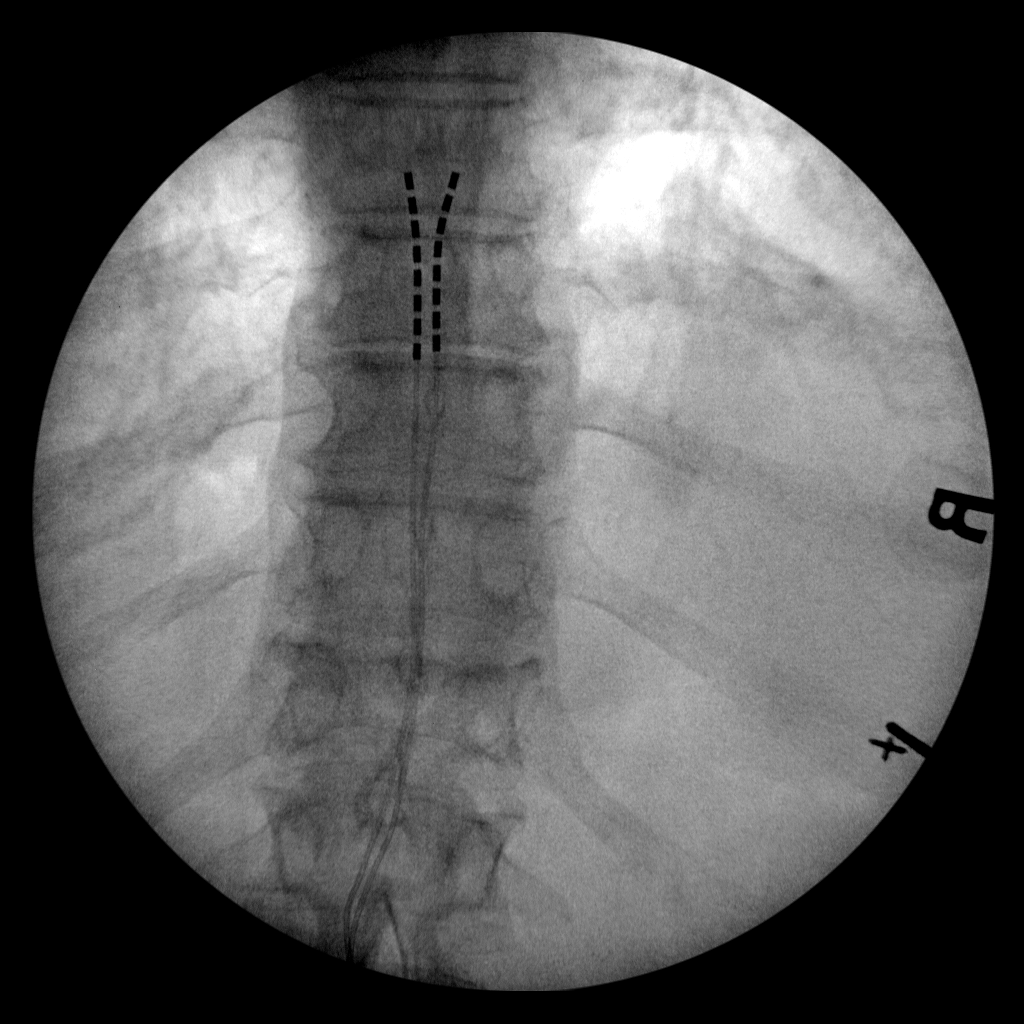

[3 of 3 positions shown; findings below may reference images not displayed]

FINDINGS: Two intraprocedural fluoroscopic images of the thoracic spine are
submitted. On the provided images, 2 spinal cord stimulator leads
project within the dorsal aspect of the thoracic spinal canal. The
exact level of lead termination is difficult to ascertain given the
provided field of view. Correlate with the procedural history.
IMPRESSION: Two intraprocedural fluoroscopic images of the thoracic spine from
spinal cord stimulator lead placement, as described.

## 2021-07-09 IMAGING — RF DG THORACOLUMBAR SPINE 2V
1 series · 3 of 3 positions shown · non-contrast
Comparison: Radiographs of the thoracic spine 03/13/2021.

CLINICAL DATA: Elective surgery. Additional history provided:
Spinal cord stimulator, permanent implant. Provided fluoroscopy time
47.3 seconds (23.33 mGy).

EXAM:
THORACOLUMBAR SPINE 1V

[Series 1: run · 3 of 3 slices shown]
[im 1/3]
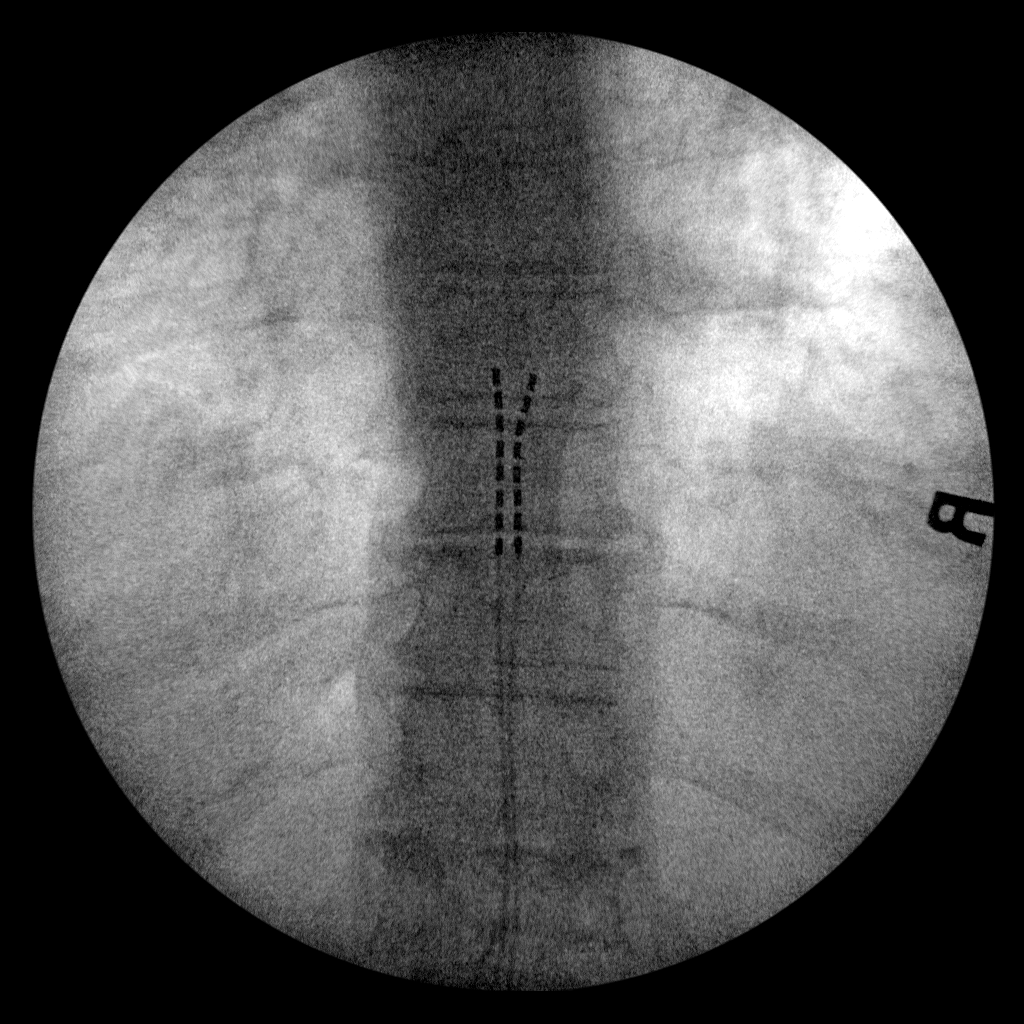
[im 2/3]
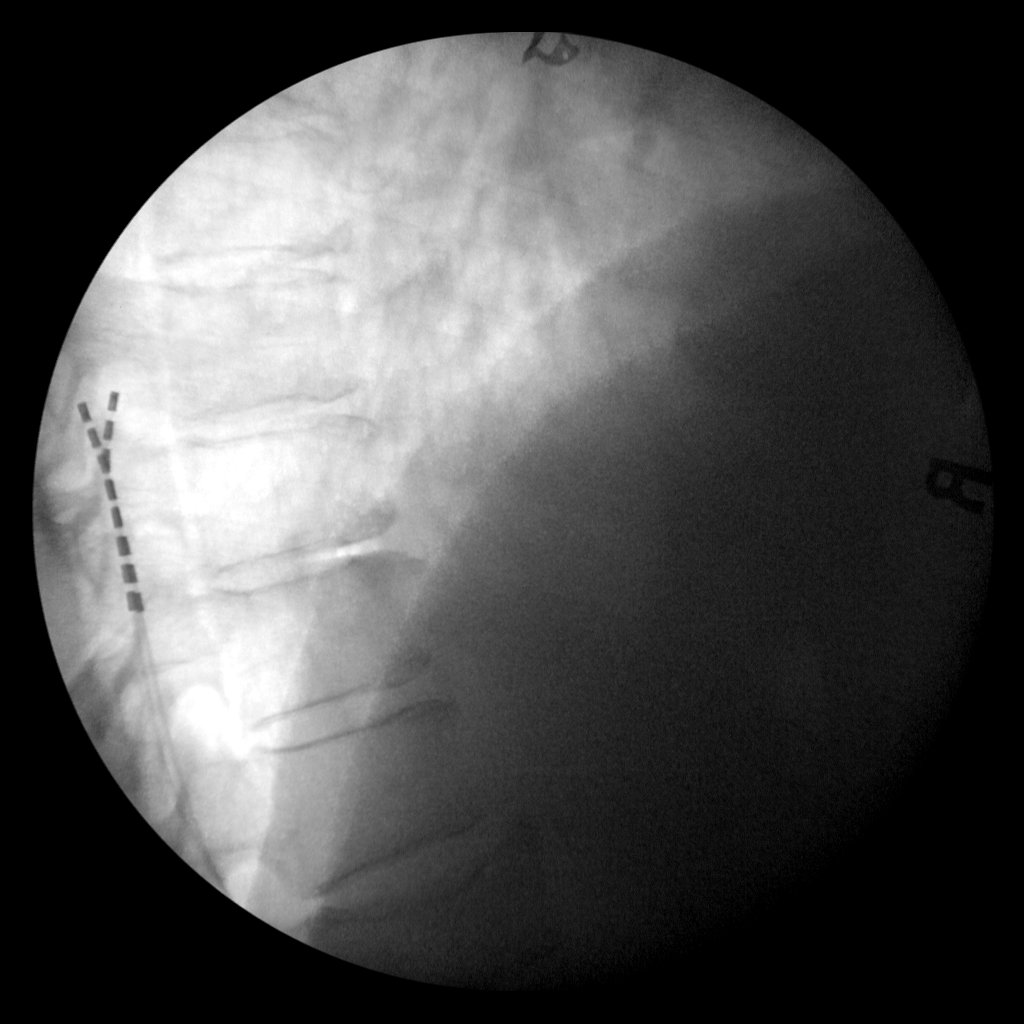
[im 3/3]
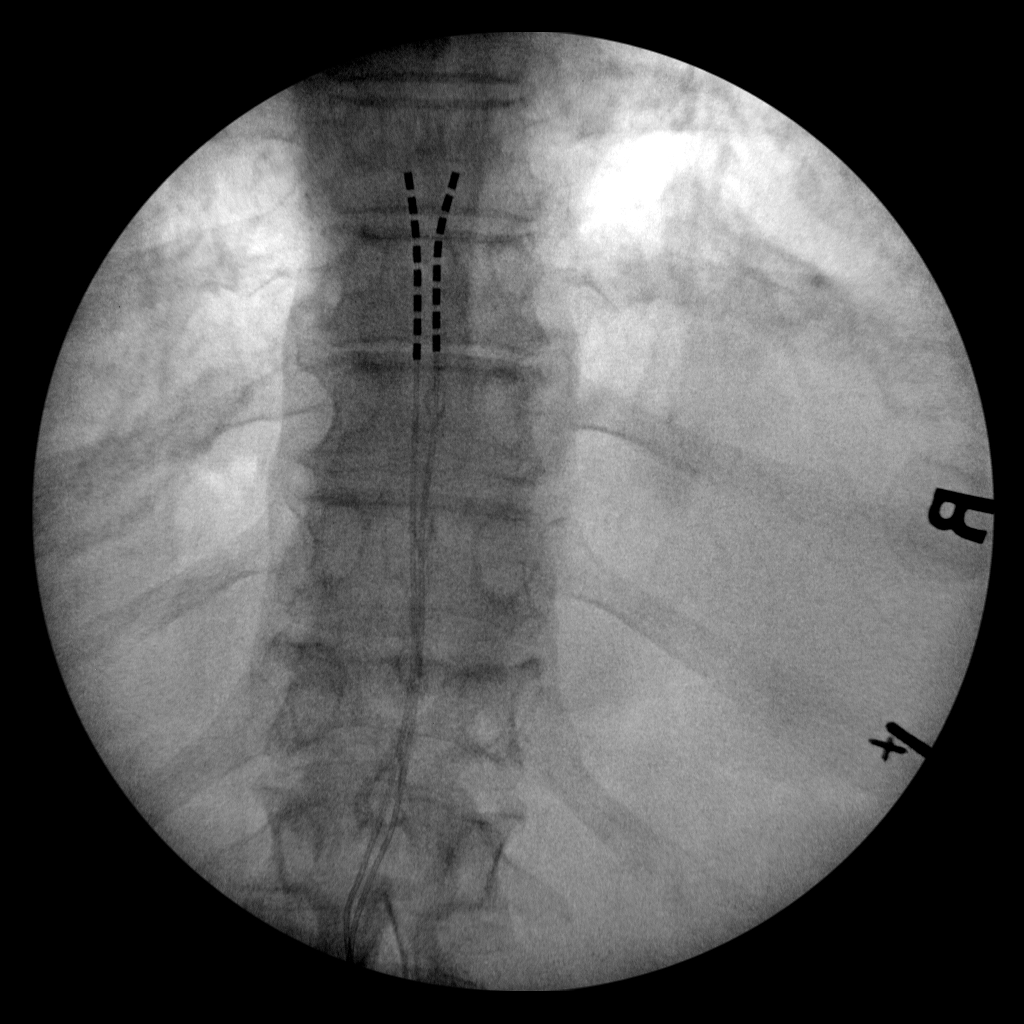

[3 of 3 positions shown; findings below may reference images not displayed]

FINDINGS: Two intraprocedural fluoroscopic images of the thoracic spine are
submitted. On the provided images, 2 spinal cord stimulator leads
project within the dorsal aspect of the thoracic spinal canal. The
exact level of lead termination is difficult to ascertain given the
provided field of view. Correlate with the procedural history.
IMPRESSION: Two intraprocedural fluoroscopic images of the thoracic spine from
spinal cord stimulator lead placement, as described.

## 2021-08-08 ENCOUNTER — Other Ambulatory Visit: Payer: Self-pay | Admitting: Orthopedic Surgery

## 2021-08-14 NOTE — Progress Notes (Signed)
Please enter orders for PAT visit scheduled for 08-24-21. 

## 2021-08-23 NOTE — Patient Instructions (Addendum)
DUE TO COVID-19 ONLY ONE VISITOR IS ALLOWED TO COME WITH YOU AND STAY IN THE WAITING ROOM ONLY DURING PRE OP AND PROCEDURE.   **NO VISITORS ARE ALLOWED IN THE SHORT STAY AREA OR RECOVERY ROOM!!**   Your procedure is scheduled on: 09/05/21   [facility]    Report to admitting at 10:00 AM   Call this number if you have problems the morning of surgery [phone]   Do not eat food :After Midnight.   May have liquids until 9:45 AM day of surgery  CLEAR LIQUID DIET  Foods Allowed                                                                     Foods Excluded  Water, Black Coffee and tea (no milk or creamer)           liquids that you cannot  Plain Jell-O in any flavor  (No red)                                    see through such as: Fruit ices (not with fruit pulp)                                            milk, soups, orange juice              Iced Popsicles (No red)                                               All solid food                                   Apple juices Sports drinks like Gatorade (No red) Lightly seasoned clear broth or consume(fat free) Sugar    The day of surgery:  Drink ONE (1) Pre-Surgery G2 by 9:45 am the morning of surgery. Drink in one sitting. Do not sip.  This drink was given to you during your hospital  pre-op appointment visit. Nothing else to drink after completing the  Pre-Surgery G2.          If you have questions, please contact your surgeon's office.     Oral Hygiene is also important to reduce your risk of infection.                                    Remember - BRUSH YOUR TEETH THE MORNING OF SURGERY WITH YOUR REGULAR TOOTHPASTE   Take these medicines the morning of surgery with A SIP OF WATER: Atorvastatin, Carvedilol, Zyrtec, Doxycycline, Cymbalta, Inhalers, Percocet, Pantoprazole, Lyrica.  DO NOT TAKE ANY ORAL DIABETIC MEDICATIONS DAY OF YOUR SURGERY  How to Manage Your Diabetes Before and  After Surgery  Why is it important to  control my blood sugar before and after surgery? Improving blood sugar levels before and after surgery helps healing and can limit problems. A way of improving blood sugar control is eating a healthy diet by:  Eating less sugar and carbohydrates  Increasing activity/exercise  Talking with your doctor about reaching your blood sugar goals High blood sugars (greater than 180 mg/dL) can raise your risk of infections and slow your recovery, so you will need to focus on controlling your diabetes during the weeks before surgery. Make sure that the doctor who takes care of your diabetes knows about your planned surgery including the date and location.  How do I manage my blood sugar before surgery? Check your blood sugar at least 4 times a day, starting 2 days before surgery, to make sure that the level is not too high or low. Check your blood sugar the morning of your surgery when you wake up and every 2 hours until you get to the Short Stay unit. If your blood sugar is less than 70 mg/dL, you will need to treat for low blood sugar: Do not take insulin. Treat a low blood sugar (less than 70 mg/dL) with  cup of clear juice (cranberry or apple), 4 glucose tablets, OR glucose gel. Recheck blood sugar in 15 minutes after treatment (to make sure it is greater than 70 mg/dL). If your blood sugar is not greater than 70 mg/dL on recheck, call [phone] for further instructions. Report your blood sugar to the short stay nurse when you get to Short Stay.  If you are admitted to the hospital after surgery: Your blood sugar will be checked by the staff and you will probably be given insulin after surgery (instead of oral diabetes medicines) to make sure you have good blood sugar levels. The goal for blood sugar control after surgery is 80-180 mg/dL.   WHAT DO I DO ABOUT MY DIABETES MEDICATION?  Do not take oral diabetes medicines (pills) the morning of  surgery.  THE DAY BEFORE SURGERY, take Pioglitazone as prescribed. Do not take Synjardy.       THE MORNING OF SURGERY, do not take any diabetes medications   Reviewed and Endorsed by [facility] Patient Education Committee, August 2015                               You may not have any metal on your body including hair pins, jewelry, and body piercing             Do not wear make-up, lotions, powders, perfumes, or deodorant  Do not wear nail polish including gel and S&S, artificial/acrylic nails, or any other type of covering on natural nails including finger and toenails. If you have artificial nails, gel coating, etc. that needs to be removed by a nail salon please have this removed prior to surgery or surgery may need to be canceled/ delayed if the surgeon/ anesthesia feels like they are unable to be safely monitored.   Do not shave  48 hours prior to surgery.    Do not bring valuables to the hospital. [place] [facility] IS NOT             RESPONSIBLE   FOR VALUABLES.   Contacts, dentures or bridgework may not be worn into surgery.    Patients discharged the day of surgery will not be allowed to drive home.  Special Instructions: Bring a copy of your healthcare power  of attorney and living will documents         the day of surgery if you haven't scanned them in before.   Please read over the following fact sheets you were given: IF YOU HAVE QUESTIONS ABOUT YOUR PRE OP INSTRUCTIONS PLEASE CALL [phone]   [facility] - Preparing for Surgery Before surgery, you can play an important role.  Because skin is not sterile, your skin needs to be as free of germs as possible.  You can reduce the number of germs on your skin by washing with CHG (chlorahexidine gluconate) soap before surgery.  CHG is an antiseptic cleaner which kills germs and bonds with the skin to continue killing germs even after washing. Please DO NOT use if you have an allergy to CHG or antibacterial soaps.  If your skin  becomes reddened/irritated stop using the CHG and inform your nurse when you arrive at Short Stay. Do not shave (including legs and underarms) for at least 48 hours prior to the first CHG shower.  You may shave your face/neck.  Please follow these instructions carefully:  1.  Shower with CHG Soap the night before surgery and the  morning of surgery.  2.  If you choose to wash your hair, wash your hair first as usual with your normal  shampoo.  3.  After you shampoo, rinse your hair and body thoroughly to remove the shampoo.                             4.  Use CHG as you would any other liquid soap.  You can apply chg directly to the skin and wash.  Gently with a scrungie or clean washcloth.  5.  Apply the CHG Soap to your body ONLY FROM THE NECK DOWN.   Do   not use on face/ open                           Wound or open sores. Avoid contact with eyes, ears mouth and   genitals (private parts).                       Wash face,  Genitals (private parts) with your normal soap.             6.  Wash thoroughly, paying special attention to the area where your    surgery  will be performed.  7.  Thoroughly rinse your body with warm water from the neck down.  8.  DO NOT shower/wash with your normal soap after using and rinsing off the CHG Soap.                9.  Pat yourself dry with a clean towel.            10.  Wear clean pajamas.            11.  Place clean sheets on your bed the night of your first shower and do not  sleep with pets. Day of Surgery : Do not apply any lotions/deodorants the morning of surgery.  Please wear clean clothes to the hospital/surgery center.  FAILURE TO FOLLOW THESE INSTRUCTIONS MAY RESULT IN THE CANCELLATION OF YOUR SURGERY  PATIENT SIGNATURE_________________________________  NURSE SIGNATURE__________________________________  ________________________________________________________________________   Rogelia Mire  An incentive spirometer is a tool that can  help keep your lungs clear and active.  This tool measures how well you are filling your lungs with each breath. Taking long deep breaths may help reverse or decrease the chance of developing breathing (pulmonary) problems (especially infection) following: A long period of time when you are unable to move or be active. BEFORE THE PROCEDURE  If the spirometer includes an indicator to show your best effort, your nurse or respiratory therapist will set it to a desired goal. If possible, sit up straight or lean slightly forward. Try not to slouch. Hold the incentive spirometer in an upright position. INSTRUCTIONS FOR USE  Sit on the edge of your bed if possible, or sit up as far as you can in bed or on a chair. Hold the incentive spirometer in an upright position. Breathe out normally. Place the mouthpiece in your mouth and seal your lips tightly around it. Breathe in slowly and as deeply as possible, raising the piston or the ball toward the top of the column. Hold your breath for 3-5 seconds or for as long as possible. Allow the piston or ball to fall to the bottom of the column. Remove the mouthpiece from your mouth and breathe out normally. Rest for a few seconds and repeat Steps 1 through 7 at least 10 times every 1-2 hours when you are awake. Take your time and take a few normal breaths between deep breaths. The spirometer may include an indicator to show your best effort. Use the indicator as a goal to work toward during each repetition. After each set of 10 deep breaths, practice coughing to be sure your lungs are clear. If you have an incision (the cut made at the time of surgery), support your incision when coughing by placing a pillow or rolled up towels firmly against it. Once you are able to get out of bed, walk around indoors and cough well. You may stop using the incentive spirometer when instructed by your caregiver.  RISKS AND COMPLICATIONS Take your time so you do not get dizzy or  light-headed. If you are in pain, you may need to take or ask for pain medication before doing incentive spirometry. It is harder to take a deep breath if you are having pain. AFTER USE Rest and breathe slowly and easily. It can be helpful to keep track of a log of your progress. Your caregiver can provide you with a simple table to help with this. If you are using the spirometer at home, follow these instructions: SEEK MEDICAL CARE IF:  You are having difficultly using the spirometer. You have trouble using the spirometer as often as instructed. Your pain medication is not giving enough relief while using the spirometer. You develop fever of 100.5 F (38.1 C) or higher. SEEK IMMEDIATE MEDICAL CARE IF:  You cough up bloody sputum that had not been present before. You develop fever of 102 F (38.9 C) or greater. You develop worsening pain at or near the incision site. MAKE SURE YOU:  Understand these instructions. Will watch your condition. Will get help right away if you are not doing well or get worse. Document Released: 04/22/2007 Document Revised: 03/03/2012 Document Reviewed: 06/23/2007 [facility] Patient Information 2014 [place], [place].   ________________________________________________________________________

## 2021-08-23 NOTE — Progress Notes (Addendum)
COVID swab appointment: N/a  COVID Vaccine Completed: yes x2 Date COVID Vaccine completed: 03/08/20, 04/10/20 Has received booster: COVID vaccine manufacturer: Moderna     Date of COVID positive in last 90 days: yes 08/23/21  PCP - Sharion Balloon, MD Cardiologist - Enrigue Catena, MD  Cardiac clearance from Dr. Rockne Menghini 08/01/21, on chart Medical clearance 08/03/21 by Ernesta Amble  Chest x-ray - CT 10/13/20 Epic EKG - 10/14/20 Epic Stress Test - yes per pt, cannot remember when ECHO - yes, pt cannot remember when Cardiac Cath - 2012 Pacemaker/ICD device last checked: Spinal Cord Stimulator: located lower back  Sleep Study - yes, positive sleep apnea CPAP - doesn't use it  Fasting Blood Sugar - 98-400 Checks Blood Sugar __2-3_ times a day Freestyle Libre  Blood Thinner Instructions: N/a Aspirin Instructions: Last Dose:  Activity level: Can perform activities of daily living without stopping and without symptoms of chest pain or shortness of breath. Uses walker to ambulate, cannot do stairs       Anesthesia review: 3L O2 PRN pt uses weekly, HTN, DM, COPD, fatty liver  Patient denies shortness of breath, fever, cough and chest pain at PAT appointment   Patient verbalized understanding of instructions that were given to them at the PAT appointment. Patient was also instructed that they will need to review over the PAT instructions again at home before surgery.

## 2021-08-24 ENCOUNTER — Encounter (HOSPITAL_COMMUNITY)
Admission: RE | Admit: 2021-08-24 | Discharge: 2021-08-24 | Disposition: A | Discharge status: Home / Self Care | Payer: Medicare PPO | Source: Ambulatory Visit | Attending: Orthopedic Surgery | Admitting: Orthopedic Surgery

## 2021-08-24 ENCOUNTER — Encounter (HOSPITAL_COMMUNITY): Payer: Self-pay

## 2021-08-24 ENCOUNTER — Other Ambulatory Visit: Payer: Self-pay

## 2021-08-24 HISTORY — DX: Gastro-esophageal reflux disease without esophagitis: K21.9

## 2021-08-24 HISTORY — DX: Anxiety disorder, unspecified: F41.9

## 2021-08-24 HISTORY — DX: Cardiac murmur, unspecified: R01.1

## 2021-08-24 HISTORY — DX: Anemia, unspecified: D64.9

## 2021-08-24 HISTORY — DX: Unspecified osteoarthritis, unspecified site: M19.90

## 2021-08-24 HISTORY — DX: Pneumonia, unspecified organism: J18.9

## 2021-08-27 ENCOUNTER — Other Ambulatory Visit: Payer: Self-pay

## 2021-08-27 ENCOUNTER — Inpatient Hospital Stay (HOSPITAL_COMMUNITY)
Admission: EM | Admit: 2021-08-27 | Discharge: 2021-08-31 | DRG: 871 | Disposition: A | Discharge status: Home / Self Care | Payer: BC Managed Care – PPO | Attending: Internal Medicine | Admitting: Internal Medicine

## 2021-08-27 ENCOUNTER — Encounter (HOSPITAL_COMMUNITY): Payer: Self-pay | Admitting: *Deleted

## 2021-08-27 ENCOUNTER — Emergency Department (HOSPITAL_COMMUNITY): Payer: BC Managed Care – PPO

## 2021-08-27 DIAGNOSIS — Z9682 Presence of neurostimulator: Secondary | ICD-10-CM

## 2021-08-27 DIAGNOSIS — J9622 Acute and chronic respiratory failure with hypercapnia: Secondary | ICD-10-CM | POA: Diagnosis present

## 2021-08-27 DIAGNOSIS — A4189 Other specified sepsis: Principal | ICD-10-CM | POA: Diagnosis present

## 2021-08-27 DIAGNOSIS — R6521 Severe sepsis with septic shock: Secondary | ICD-10-CM | POA: Diagnosis present

## 2021-08-27 DIAGNOSIS — Z888 Allergy status to other drugs, medicaments and biological substances status: Secondary | ICD-10-CM

## 2021-08-27 DIAGNOSIS — Z9049 Acquired absence of other specified parts of digestive tract: Secondary | ICD-10-CM

## 2021-08-27 DIAGNOSIS — T380X5A Adverse effect of glucocorticoids and synthetic analogues, initial encounter: Secondary | ICD-10-CM | POA: Diagnosis present

## 2021-08-27 DIAGNOSIS — I878 Other specified disorders of veins: Secondary | ICD-10-CM | POA: Diagnosis present

## 2021-08-27 DIAGNOSIS — U071 COVID-19: Secondary | ICD-10-CM | POA: Diagnosis present

## 2021-08-27 DIAGNOSIS — E119 Type 2 diabetes mellitus without complications: Secondary | ICD-10-CM

## 2021-08-27 DIAGNOSIS — Z79899 Other long term (current) drug therapy: Secondary | ICD-10-CM

## 2021-08-27 DIAGNOSIS — Z9981 Dependence on supplemental oxygen: Secondary | ICD-10-CM

## 2021-08-27 DIAGNOSIS — J9602 Acute respiratory failure with hypercapnia: Secondary | ICD-10-CM | POA: Diagnosis present

## 2021-08-27 DIAGNOSIS — E875 Hyperkalemia: Secondary | ICD-10-CM | POA: Diagnosis present

## 2021-08-27 DIAGNOSIS — Z833 Family history of diabetes mellitus: Secondary | ICD-10-CM

## 2021-08-27 DIAGNOSIS — F419 Anxiety disorder, unspecified: Secondary | ICD-10-CM | POA: Diagnosis present

## 2021-08-27 DIAGNOSIS — K219 Gastro-esophageal reflux disease without esophagitis: Secondary | ICD-10-CM | POA: Diagnosis present

## 2021-08-27 DIAGNOSIS — Z7984 Long term (current) use of oral hypoglycemic drugs: Secondary | ICD-10-CM

## 2021-08-27 DIAGNOSIS — I959 Hypotension, unspecified: Secondary | ICD-10-CM | POA: Diagnosis present

## 2021-08-27 DIAGNOSIS — E1122 Type 2 diabetes mellitus with diabetic chronic kidney disease: Secondary | ICD-10-CM

## 2021-08-27 DIAGNOSIS — G9341 Metabolic encephalopathy: Secondary | ICD-10-CM | POA: Diagnosis present

## 2021-08-27 DIAGNOSIS — J9621 Acute and chronic respiratory failure with hypoxia: Secondary | ICD-10-CM | POA: Diagnosis present

## 2021-08-27 DIAGNOSIS — K760 Fatty (change of) liver, not elsewhere classified: Secondary | ICD-10-CM | POA: Diagnosis present

## 2021-08-27 DIAGNOSIS — G894 Chronic pain syndrome: Secondary | ICD-10-CM | POA: Diagnosis present

## 2021-08-27 DIAGNOSIS — E1165 Type 2 diabetes mellitus with hyperglycemia: Secondary | ICD-10-CM | POA: Diagnosis present

## 2021-08-27 DIAGNOSIS — G4733 Obstructive sleep apnea (adult) (pediatric): Secondary | ICD-10-CM | POA: Diagnosis present

## 2021-08-27 DIAGNOSIS — Z792 Long term (current) use of antibiotics: Secondary | ICD-10-CM

## 2021-08-27 DIAGNOSIS — J440 Chronic obstructive pulmonary disease with acute lower respiratory infection: Secondary | ICD-10-CM | POA: Diagnosis present

## 2021-08-27 DIAGNOSIS — M4626 Osteomyelitis of vertebra, lumbar region: Secondary | ICD-10-CM | POA: Diagnosis present

## 2021-08-27 DIAGNOSIS — N179 Acute kidney failure, unspecified: Secondary | ICD-10-CM | POA: Diagnosis present

## 2021-08-27 DIAGNOSIS — J1282 Pneumonia due to coronavirus disease 2019: Secondary | ICD-10-CM | POA: Diagnosis present

## 2021-08-27 DIAGNOSIS — D6959 Other secondary thrombocytopenia: Secondary | ICD-10-CM | POA: Diagnosis present

## 2021-08-27 DIAGNOSIS — M1990 Unspecified osteoarthritis, unspecified site: Secondary | ICD-10-CM | POA: Diagnosis present

## 2021-08-27 DIAGNOSIS — Z87891 Personal history of nicotine dependence: Secondary | ICD-10-CM

## 2021-08-27 DIAGNOSIS — I110 Hypertensive heart disease with heart failure: Secondary | ICD-10-CM | POA: Diagnosis present

## 2021-08-27 DIAGNOSIS — Z880 Allergy status to penicillin: Secondary | ICD-10-CM

## 2021-08-27 DIAGNOSIS — E872 Acidosis: Secondary | ICD-10-CM | POA: Diagnosis present

## 2021-08-27 DIAGNOSIS — I5032 Chronic diastolic (congestive) heart failure: Secondary | ICD-10-CM | POA: Diagnosis present

## 2021-08-27 DIAGNOSIS — J9601 Acute respiratory failure with hypoxia: Secondary | ICD-10-CM | POA: Diagnosis not present

## 2021-08-27 DIAGNOSIS — Z7951 Long term (current) use of inhaled steroids: Secondary | ICD-10-CM

## 2021-08-27 DIAGNOSIS — M488X6 Other specified spondylopathies, lumbar region: Secondary | ICD-10-CM | POA: Diagnosis present

## 2021-08-27 DIAGNOSIS — R339 Retention of urine, unspecified: Secondary | ICD-10-CM | POA: Diagnosis present

## 2021-08-27 DIAGNOSIS — Z79891 Long term (current) use of opiate analgesic: Secondary | ICD-10-CM

## 2021-08-27 LAB — CBC WITH DIFFERENTIAL/PLATELET
Abs Immature Granulocytes: 0.08 10*3/uL — ABNORMAL HIGH (ref 0.00–0.07)
Basophils Absolute: 0 10*3/uL (ref 0.0–0.1)
Basophils Relative: 0 %
Eosinophils Absolute: 0.1 10*3/uL (ref 0.0–0.5)
Eosinophils Relative: 1 %
HCT: 37.7 % (ref 36.0–46.0)
Hemoglobin: 11.7 g/dL — ABNORMAL LOW (ref 12.0–15.0)
Immature Granulocytes: 1 %
Lymphocytes Relative: 15 %
Lymphs Abs: 1.9 10*3/uL (ref 0.7–4.0)
MCH: 29.5 pg (ref 26.0–34.0)
MCHC: 31 g/dL (ref 30.0–36.0)
MCV: 95 fL (ref 80.0–100.0)
Monocytes Absolute: 0.8 10*3/uL (ref 0.1–1.0)
Monocytes Relative: 6 %
Neutro Abs: 9.7 10*3/uL — ABNORMAL HIGH (ref 1.7–7.7)
Neutrophils Relative %: 77 %
Platelets: 129 10*3/uL — ABNORMAL LOW (ref 150–400)
RBC: 3.97 MIL/uL (ref 3.87–5.11)
RDW: 13.3 % (ref 11.5–15.5)
WBC: 12.5 10*3/uL — ABNORMAL HIGH (ref 4.0–10.5)
nRBC: 0 % (ref 0.0–0.2)

## 2021-08-27 LAB — I-STAT ARTERIAL BLOOD GAS, ED
Acid-Base Excess: 3 mmol/L — ABNORMAL HIGH (ref 0.0–2.0)
Bicarbonate: 30.3 mmol/L — ABNORMAL HIGH (ref 20.0–28.0)
Calcium, Ion: 1.21 mmol/L (ref 1.15–1.40)
HCT: 32 % — ABNORMAL LOW (ref 36.0–46.0)
Hemoglobin: 10.9 g/dL — ABNORMAL LOW (ref 12.0–15.0)
O2 Saturation: 84 %
Patient temperature: 98
Potassium: 4.5 mmol/L (ref 3.5–5.1)
Sodium: 138 mmol/L (ref 135–145)
TCO2: 32 mmol/L (ref 22–32)
pCO2 arterial: 59.6 mmHg — ABNORMAL HIGH (ref 32.0–48.0)
pH, Arterial: 7.313 — ABNORMAL LOW (ref 7.350–7.450)
pO2, Arterial: 53 mmHg — ABNORMAL LOW (ref 83.0–108.0)

## 2021-08-27 LAB — COMPREHENSIVE METABOLIC PANEL
ALT: 19 U/L (ref 0–44)
AST: 20 U/L (ref 15–41)
Albumin: 3.2 g/dL — ABNORMAL LOW (ref 3.5–5.0)
Alkaline Phosphatase: 104 U/L (ref 38–126)
Anion gap: 8 (ref 5–15)
BUN: 21 mg/dL (ref 8–23)
CO2: 28 mmol/L (ref 22–32)
Calcium: 8.8 mg/dL — ABNORMAL LOW (ref 8.9–10.3)
Chloride: 102 mmol/L (ref 98–111)
Creatinine, Ser: 1.54 mg/dL — ABNORMAL HIGH (ref 0.44–1.00)
GFR, Estimated: 37 mL/min — ABNORMAL LOW (ref >60)
Glucose, Bld: 155 mg/dL — ABNORMAL HIGH (ref 70–99)
Potassium: 4.5 mmol/L (ref 3.5–5.1)
Sodium: 138 mmol/L (ref 135–145)
Total Bilirubin: 0.8 mg/dL (ref 0.3–1.2)
Total Protein: 7 g/dL (ref 6.5–8.1)

## 2021-08-27 LAB — LACTIC ACID, PLASMA: Lactic Acid, Venous: 1.7 mmol/L (ref 0.5–1.9)

## 2021-08-27 LAB — BRAIN NATRIURETIC PEPTIDE: B Natriuretic Peptide: 302.3 pg/mL — ABNORMAL HIGH (ref 0.0–100.0)

## 2021-08-27 LAB — CBG MONITORING, ED: Glucose-Capillary: 140 mg/dL — ABNORMAL HIGH (ref 70–99)

## 2021-08-27 LAB — AMMONIA: Ammonia: 11 umol/L (ref 9–35)

## 2021-08-27 MED ORDER — ALBUTEROL SULFATE HFA 108 (90 BASE) MCG/ACT IN AERS
4.0000 | INHALATION_SPRAY | Freq: Once | RESPIRATORY_TRACT | Status: DC
  Filled 2021-08-27: qty 6.7

## 2021-08-27 MED ORDER — NALOXONE HCL 0.4 MG/ML IJ SOLN: 0.4000 mg | Freq: Once | INTRAMUSCULAR | Status: DC

## 2021-08-27 MED ORDER — DEXAMETHASONE SODIUM PHOSPHATE 10 MG/ML IJ SOLN
6.0000 mg | Freq: Once | INTRAMUSCULAR | Status: AC
  Administered 2021-08-27: 6 mg via INTRAVENOUS
  Filled 2021-08-27: qty 1

## 2021-08-27 MED ORDER — LACTATED RINGERS IV BOLUS
1000.0000 mL | Freq: Once | INTRAVENOUS | Status: AC
  Administered 2021-08-27: 1000 mL via INTRAVENOUS

## 2021-08-27 NOTE — ED Triage Notes (Signed)
Pt had a + home covid test on Wednesday. Pt reports sob a couple of hours ago, denies cough or pain. Hx of copd. Pt is supposed to be wearing 2L oxygen, arrived to triage on room air with sats reading 60% on RA, then increased to 82%

## 2021-08-27 NOTE — ED Provider Notes (Signed)
[facility] EMERGENCY DEPARTMENT Provider Note   CSN: [id number] Arrival date & time: 08/27/21  2100  LEVEL 5 CAVEAT - ALTERED MENTAL STATUS   History Chief Complaint  Patient presents with   Shortness of Breath    Brenda Morgan is a 65 y.o. female.  HPI 65 year old female presents with hypoxia and trouble breathing.  History is somewhat from the patient for some later from the husband as the patient is confused.  She has been dealing with COVID-like symptoms since 8/31.  She is had cough, shortness of breath, congestion.  Today however she seems sleepy and having more congestion.  She wears oxygen with her BiPAP at night but not during the day.  She is found to have O2 sats in the 80s.  Patient had a home COVID test that was positive and was started on paxlovid by the PCP.  Past Medical History:  Diagnosis Date   Anemia    Anxiety    Arthritis    COPD (chronic obstructive pulmonary disease) (HCC)    Diabetes mellitus without complication (HCC)    Fatty liver 2012   per pt 06/24/20   GERD (gastroesophageal reflux disease)    Heart murmur    Hypertension    Pneumonia    Sleep apnea     Patient Active Problem List   Diagnosis Date Noted   Venous stasis dermatitis of both lower extremities 12/21/2020   AKI (acute kidney injury) (HCC) 09/15/2020   Infection of lumbar spine (HCC) 08/02/2020   Hardware complicating wound infection (HCC) 08/02/2020   COPD (chronic obstructive pulmonary disease) (HCC) 08/01/2020   DM II (diabetes mellitus, type II), controlled (HCC) 08/01/2020   HTN (hypertension) 08/01/2020   Postoperative wound infection 07/31/2020   Lumbar radiculopathy 07/06/2020   Lumbar spinal stenosis 06/28/2020    Past Surgical History:  Procedure Laterality Date   BACK SURGERY     carotid artery surgery Right 10/07/2012   carotid ultrasound  06/2015   CHOLECYSTECTOMY  1981   COLONOSCOPY  2014   DIAGNOSTIC MAMMOGRAM  03/2017   groin lypnoid  Left 03/2015   LUMBAR WOUND DEBRIDEMENT N/A 08/02/2020   Procedure: IRRIGATE AND DEBRIDEMENT OF LUMBAR WOUND, PLACEMENT OF WOUND VAC;  Surgeon: Dawley, Alan Mulder, DO;  Location: [facility] OR;  Service: Neurosurgery;  Laterality: N/A;   right heel spur  1994   SPINAL CORD STIMULATOR INSERTION N/A 05/03/2021   Procedure: Spinal Cord Stimulator Placement;  Surgeon: Renaldo Fiddler, MD;  Location: [facility] OR;  Service: Neurosurgery;  Laterality: N/A;   TUBAL LIGATION  1985   wrist trigger release Right 01/2016     OB History   No obstetric history on file.     Family History  Problem Relation Age of Onset   Diabetes Mother    Heart disease Mother    Stomach cancer Mother    Diabetes Father    Heart disease Father    Lung cancer Father     Social History   Tobacco Use   Smoking status: Former    Packs/day: 1.00    Types: Cigarettes    Quit date: 04/23/2021    Years since quitting: 0.3   Smokeless tobacco: Never   Tobacco comments:    not thinking about it right now.  Vaping Use   Vaping Use: Never used  Substance Use Topics   Alcohol use: Not Currently   Drug use: Not Currently    Home Medications Prior to Admission medications   Medication  Sig Start Date End Date Taking? Authorizing Provider  atorvastatin (LIPITOR) 10 MG tablet Take 5 mg by mouth daily.    [provider]  azelastine (ASTELIN) 0.1 % nasal spray Place 2 sprays into both nostrils 2 (two) times daily as needed for rhinitis (congestion).  07/30/20   [provider]  Biotin w/ Vitamins C & E (HAIR/SKIN/NAILS PO) Take 3 capsules by mouth daily. With Collagen    [provider]  carvedilol (COREG) 25 MG tablet Take 25 mg by mouth daily.    [provider]  cetirizine (ZYRTEC) 10 MG tablet Take 10 mg by mouth daily.    [provider]  Cholecalciferol (VITAMIN D) 50 MCG (2000 UT) tablet Take 2,000 Units by mouth daily.    [provider]  diclofenac Sodium (VOLTAREN) 1 % GEL  Apply 4 g topically 4 (four) times daily. Patient taking differently: Apply 4 g topically 4 (four) times daily as needed (pain). 09/23/20   Melene Plan, DO  doxycycline (VIBRA-TABS) 100 MG tablet Take 1 tablet (100 mg total) by mouth 2 (two) times daily. 11/07/20   Blanchard Kelch, NP  DULoxetine (CYMBALTA) 30 MG capsule Take 30 mg by mouth daily.    [provider]  Empagliflozin-metFORMIN HCl ER (SYNJARDY XR) 25-1000 MG TB24 Take 1 tablet by mouth daily.    [provider]  famotidine (PEPCID) 40 MG tablet Take 40 mg by mouth at bedtime. 09/09/20   [provider]  fentaNYL (DURAGESIC) 50 MCG/HR Place 1 patch onto the skin every 3 (three) days.    [provider]  ferrous sulfate 325 (65 FE) MG tablet Take 325 mg by mouth daily with breakfast.    [provider]  Fluticasone-Salmeterol (ADVAIR) 250-50 MCG/DOSE AEPB Inhale 1 puff into the lungs 2 (two) times daily as needed (shortness of breath/wheezing).     [provider]  furosemide (LASIX) 40 MG tablet Take 40 mg by mouth every other day.    [provider]  hydrOXYzine (ATARAX/VISTARIL) 25 MG tablet Take 25 mg by mouth at bedtime. 07/30/20   [provider]  icosapent Ethyl (VASCEPA) 1 g capsule Take 2 g by mouth 2 (two) times daily.    [provider]  lamoTRIgine (LAMICTAL) 150 MG tablet Take 150 mg by mouth See admin instructions. Take one tablet (150 mg) by mouth every night with a 25 mg tablet for a total dose of 175 mg daily    [provider]  lamoTRIgine (LAMICTAL) 25 MG tablet Take 25 mg by mouth See admin instructions. Take one tablet (25 mg) by mouth every night with a 150 mg tablet for a total dose of 175 mg daily 07/21/20   [provider]  losartan (COZAAR) 25 MG tablet Take 25 mg by mouth daily. 09/28/20   [provider]  magnesium oxide (MAG-OX) 400 MG tablet Take 400 mg by mouth daily.    [provider]   metolazone (ZAROXOLYN) 5 MG tablet Take 5 mg by mouth 3 (three) times a week.    [provider]  naloxegol oxalate (MOVANTIK) 25 MG TABS tablet Take 25 mg by mouth daily.    [provider]  oxyCODONE-acetaminophen (PERCOCET) 10-325 MG tablet Take 1 tablet by mouth every 8 (eight) hours as needed for pain.    [provider]  OXYGEN Inhale 3 L into the lungs at bedtime. Use with BIPAP    [provider]  OZEMPIC, 1 MG/DOSE, 4 MG/3ML  SOPN Inject 1 mg into the skin once a week. Take on Sunday 09/14/20   [provider]  pantoprazole (PROTONIX) 40 MG tablet Take 40 mg by mouth daily.    [provider]  pioglitazone (ACTOS) 15 MG tablet Take 15 mg by mouth daily.    [provider]  potassium chloride SA (KLOR-CON) 20 MEQ tablet Take 20 mEq by mouth daily.    [provider]  pramipexole (MIRAPEX) 0.5 MG tablet Take 1 mg by mouth at bedtime.    [provider]  pregabalin (LYRICA) 200 MG capsule Take 200 mg by mouth in the morning, at noon, and at bedtime.    [provider]  silver sulfADIAZINE (SILVADENE) 1 % cream Apply 1 application topically daily as needed (leg sores).     [provider]  spironolactone (ALDACTONE) 50 MG tablet Take 50 mg by mouth daily.    [provider]  traZODone (DESYREL) 50 MG tablet Take 50 mg by mouth at bedtime.    [provider]    Allergies    Amlodipine, Naltrexone, Penicillins, and Penicillin g  Review of Systems   Review of Systems  Unable to perform ROS: Mental status change   Physical Exam Updated Vital Signs BP (!) 108/59   Pulse 61   Temp 98 F (36.7 C) (Oral)   Resp 12   SpO2 (!) 84%   Physical Exam Vitals and nursing note reviewed.  Constitutional:      Appearance: She is well-developed.     Comments: Awake but sleepy. However she is able to talk to me in complete sentences  HENT:     Head: Normocephalic and atraumatic.      Right Ear: External ear normal.     Left Ear: External ear normal.     Nose: Nose normal.  Eyes:     General:        Right eye: No discharge.        Left eye: No discharge.  Cardiovascular:     Rate and Rhythm: Normal rate and regular rhythm.     Heart sounds: Normal heart sounds.  Pulmonary:     Effort: Pulmonary effort is normal. No accessory muscle usage or respiratory distress.     Breath sounds: Wheezing (diffuse, expiratory) present.  Abdominal:     Palpations: Abdomen is soft.     Tenderness: There is no abdominal tenderness.  Musculoskeletal:     Right lower leg: No edema.     Left lower leg: No edema.  Skin:    General: Skin is warm and dry.  Psychiatric:        Mood and Affect: Mood is not anxious.    ED Results / Procedures / Treatments   Labs (all labs ordered are listed, but only abnormal results are displayed) Labs Reviewed  COMPREHENSIVE METABOLIC PANEL - Abnormal; Notable for the following components:      Result Value   Glucose, Bld 155 (*)    Creatinine, Ser 1.54 (*)    Calcium 8.8 (*)    Albumin 3.2 (*)    GFR, Estimated 37 (*)    All other components within normal limits  BRAIN NATRIURETIC PEPTIDE - Abnormal; Notable for the following components:   B Natriuretic Peptide 302.3 (*)    All other components within normal limits  CBC WITH DIFFERENTIAL/PLATELET - Abnormal; Notable for the following components:   WBC 12.5 (*)    Hemoglobin 11.7 (*)    Platelets 129 (*)  Neutro Abs 9.7 (*)    Abs Immature Granulocytes 0.08 (*)    All other components within normal limits  CBG MONITORING, ED - Abnormal; Notable for the following components:   Glucose-Capillary 140 (*)    All other components within normal limits  I-STAT ARTERIAL BLOOD GAS, ED - Abnormal; Notable for the following components:   pH, Arterial 7.313 (*)    pCO2 arterial 59.6 (*)    pO2, Arterial 53 (*)    Bicarbonate 30.3 (*)    Acid-Base Excess 3.0 (*)    HCT 32.0 (*)     Hemoglobin 10.9 (*)    All other components within normal limits  CULTURE, BLOOD (ROUTINE X 2)  CULTURE, BLOOD (ROUTINE X 2)  RESP PANEL BY RT-PCR (FLU A&B, COVID) ARPGX2  LACTIC ACID, PLASMA  AMMONIA  LACTIC ACID, PLASMA  URINALYSIS, ROUTINE W REFLEX MICROSCOPIC    EKG EKG Interpretation  Date/Time:  Sunday August 27 2021 22:24:37 EDT Ventricular Rate:  63 PR Interval:  157 QRS Duration: 89 QT Interval:  436 QTC Calculation: 447 R Axis:   67 Text Interpretation: Sinus rhythm no acute ST/T changes Confirmed by [name] [phone]) on 08/27/2021 11:25:56 PM  Radiology DG Chest Portable 1 View  Result Date: 08/27/2021 CLINICAL DATA:  Shortness of breath.  COVID positive. EXAM: PORTABLE CHEST 1 VIEW COMPARISON:  Most recent radiograph 10/13/2020 FINDINGS: Lung volumes are low. Upper normal heart size. There is vascular congestion. Mild patchy bibasilar airspace disease, left greater than right. No pneumothorax or pleural effusion. Spinal stimulator in place. No acute osseous abnormalities are seen. IMPRESSION: Low lung volumes with upper normal heart size and vascular congestion. Patchy bibasilar airspace disease, left greater than right, atelectasis versus pneumonia in the setting of COVID. Electronically Signed   By: Narda Rutherford M.D.   On: 08/27/2021 22:21    Procedures .Critical Care  Date/Time: 08/27/2021 11:34 PM Performed by: [name], MD Authorized by: [name], MD   Critical care provider statement:    Critical care time (minutes):  40   Critical care time was exclusive of:  Separately billable procedures and treating other patients   Critical care was necessary to treat or prevent imminent or life-threatening deterioration of the following conditions:  Respiratory failure   Critical care was time spent personally by me on the following activities:  Discussions with consultants, evaluation of patient's response to treatment, examination of patient,  ordering and performing treatments and interventions, ordering and review of laboratory studies, ordering and review of radiographic studies, pulse oximetry, re-evaluation of patient's condition, obtaining history from patient or surrogate and review of old charts   Medications Ordered in ED Medications  albuterol (VENTOLIN HFA) 108 (90 Base) MCG/ACT inhaler 4 puff (has no administration in time range)  dexamethasone (DECADRON) injection 6 mg (has no administration in time range)  lactated ringers bolus 1,000 mL (1,000 mLs Intravenous New Bag/Given 08/27/21 2324)    ED Course  I have reviewed the triage vital signs and the nursing notes.  Pertinent labs & imaging results that were available during my care of the patient were reviewed by me and considered in my medical decision making (see chart for details).    MDM Rules/Calculators/A&P                           Patient presents with respiratory failure, both hypoxia and hypercarbia.  Seems to be mild and she is only mildly somnolent and  altered.  However no focal deficits and so I think the confusion is from the CO2.  She was placed on BiPAP.  Otherwise she is breathing well and her chest x-ray is consistent with pneumonia from COVID.  She will be started on Decadron and remdesivir.  She will need admission.  I discussed with Dr. Toniann Fail, and because of the covid on BiPAP he is asking that talk to ICU.  Consult is currently pending.  Needs to be admitted to either ICU versus stepdown. Care to Dr. Nicanor Alcon. Final Clinical Impression(s) / ED Diagnoses Final diagnoses:  Acute respiratory failure with hypoxia and hypercapnia (HCC)    Rx / DC Orders ED Discharge Orders     None        [name], MD 08/28/21 0009

## 2021-08-28 DIAGNOSIS — J9611 Chronic respiratory failure with hypoxia: Secondary | ICD-10-CM | POA: Diagnosis not present

## 2021-08-28 DIAGNOSIS — E875 Hyperkalemia: Secondary | ICD-10-CM | POA: Diagnosis present

## 2021-08-28 DIAGNOSIS — G894 Chronic pain syndrome: Secondary | ICD-10-CM | POA: Diagnosis present

## 2021-08-28 DIAGNOSIS — N179 Acute kidney failure, unspecified: Secondary | ICD-10-CM

## 2021-08-28 DIAGNOSIS — R339 Retention of urine, unspecified: Secondary | ICD-10-CM | POA: Diagnosis present

## 2021-08-28 DIAGNOSIS — J9601 Acute respiratory failure with hypoxia: Secondary | ICD-10-CM | POA: Diagnosis present

## 2021-08-28 DIAGNOSIS — I959 Hypotension, unspecified: Secondary | ICD-10-CM | POA: Diagnosis present

## 2021-08-28 DIAGNOSIS — J9602 Acute respiratory failure with hypercapnia: Secondary | ICD-10-CM | POA: Diagnosis not present

## 2021-08-28 DIAGNOSIS — I5032 Chronic diastolic (congestive) heart failure: Secondary | ICD-10-CM | POA: Diagnosis present

## 2021-08-28 DIAGNOSIS — E1165 Type 2 diabetes mellitus with hyperglycemia: Secondary | ICD-10-CM | POA: Diagnosis present

## 2021-08-28 DIAGNOSIS — G9341 Metabolic encephalopathy: Secondary | ICD-10-CM | POA: Diagnosis present

## 2021-08-28 DIAGNOSIS — I110 Hypertensive heart disease with heart failure: Secondary | ICD-10-CM | POA: Diagnosis present

## 2021-08-28 DIAGNOSIS — A4189 Other specified sepsis: Secondary | ICD-10-CM | POA: Diagnosis present

## 2021-08-28 DIAGNOSIS — K219 Gastro-esophageal reflux disease without esophagitis: Secondary | ICD-10-CM | POA: Diagnosis present

## 2021-08-28 DIAGNOSIS — R6521 Severe sepsis with septic shock: Secondary | ICD-10-CM | POA: Diagnosis present

## 2021-08-28 DIAGNOSIS — G4733 Obstructive sleep apnea (adult) (pediatric): Secondary | ICD-10-CM | POA: Diagnosis present

## 2021-08-28 DIAGNOSIS — E119 Type 2 diabetes mellitus without complications: Secondary | ICD-10-CM | POA: Diagnosis not present

## 2021-08-28 DIAGNOSIS — J1282 Pneumonia due to coronavirus disease 2019: Secondary | ICD-10-CM | POA: Diagnosis present

## 2021-08-28 DIAGNOSIS — J440 Chronic obstructive pulmonary disease with acute lower respiratory infection: Secondary | ICD-10-CM | POA: Diagnosis present

## 2021-08-28 DIAGNOSIS — J9621 Acute and chronic respiratory failure with hypoxia: Secondary | ICD-10-CM | POA: Diagnosis present

## 2021-08-28 DIAGNOSIS — E872 Acidosis: Secondary | ICD-10-CM | POA: Diagnosis present

## 2021-08-28 DIAGNOSIS — F419 Anxiety disorder, unspecified: Secondary | ICD-10-CM | POA: Diagnosis present

## 2021-08-28 DIAGNOSIS — R579 Shock, unspecified: Secondary | ICD-10-CM | POA: Insufficient documentation

## 2021-08-28 DIAGNOSIS — K760 Fatty (change of) liver, not elsewhere classified: Secondary | ICD-10-CM | POA: Diagnosis present

## 2021-08-28 DIAGNOSIS — D6959 Other secondary thrombocytopenia: Secondary | ICD-10-CM | POA: Diagnosis present

## 2021-08-28 DIAGNOSIS — I878 Other specified disorders of veins: Secondary | ICD-10-CM | POA: Diagnosis present

## 2021-08-28 DIAGNOSIS — M1990 Unspecified osteoarthritis, unspecified site: Secondary | ICD-10-CM | POA: Diagnosis present

## 2021-08-28 DIAGNOSIS — A419 Sepsis, unspecified organism: Secondary | ICD-10-CM | POA: Diagnosis not present

## 2021-08-28 DIAGNOSIS — U071 COVID-19: Secondary | ICD-10-CM | POA: Diagnosis present

## 2021-08-28 DIAGNOSIS — J9622 Acute and chronic respiratory failure with hypercapnia: Secondary | ICD-10-CM | POA: Diagnosis present

## 2021-08-28 LAB — BASIC METABOLIC PANEL
Anion gap: 9 (ref 5–15)
BUN: 29 mg/dL — ABNORMAL HIGH (ref 8–23)
CO2: 27 mmol/L (ref 22–32)
Calcium: 8.6 mg/dL — ABNORMAL LOW (ref 8.9–10.3)
Chloride: 100 mmol/L (ref 98–111)
Creatinine, Ser: 1.65 mg/dL — ABNORMAL HIGH (ref 0.44–1.00)
GFR, Estimated: 34 mL/min — ABNORMAL LOW (ref >60)
Glucose, Bld: 149 mg/dL — ABNORMAL HIGH (ref 70–99)
Potassium: 4.6 mmol/L (ref 3.5–5.1)
Sodium: 136 mmol/L (ref 135–145)

## 2021-08-28 LAB — CBC
HCT: 33.7 % — ABNORMAL LOW (ref 36.0–46.0)
Hemoglobin: 10.3 g/dL — ABNORMAL LOW (ref 12.0–15.0)
MCH: 28.9 pg (ref 26.0–34.0)
MCHC: 30.6 g/dL (ref 30.0–36.0)
MCV: 94.7 fL (ref 80.0–100.0)
Platelets: 122 10*3/uL — ABNORMAL LOW (ref 150–400)
RBC: 3.56 MIL/uL — ABNORMAL LOW (ref 3.87–5.11)
RDW: 13.3 % (ref 11.5–15.5)
WBC: 10.3 10*3/uL (ref 4.0–10.5)
nRBC: 0 % (ref 0.0–0.2)

## 2021-08-28 LAB — HEMOGLOBIN A1C
Hgb A1c MFr Bld: 5.9 % — ABNORMAL HIGH (ref 4.8–5.6)
Mean Plasma Glucose: 122.63 mg/dL

## 2021-08-28 LAB — CREATININE, SERUM
Creatinine, Ser: 1.75 mg/dL — ABNORMAL HIGH (ref 0.44–1.00)
GFR, Estimated: 32 mL/min — ABNORMAL LOW (ref >60)

## 2021-08-28 LAB — RESP PANEL BY RT-PCR (FLU A&B, COVID) ARPGX2
Influenza A by PCR: NEGATIVE
Influenza B by PCR: NEGATIVE
SARS Coronavirus 2 by RT PCR: POSITIVE — AB

## 2021-08-28 LAB — GLUCOSE, CAPILLARY
Glucose-Capillary: 131 mg/dL — ABNORMAL HIGH (ref 70–99)
Glucose-Capillary: 147 mg/dL — ABNORMAL HIGH (ref 70–99)
Glucose-Capillary: 167 mg/dL — ABNORMAL HIGH (ref 70–99)
Glucose-Capillary: 175 mg/dL — ABNORMAL HIGH (ref 70–99)
Glucose-Capillary: 225 mg/dL — ABNORMAL HIGH (ref 70–99)
Glucose-Capillary: 82 mg/dL (ref 70–99)

## 2021-08-28 LAB — CBG MONITORING, ED
Glucose-Capillary: 131 mg/dL — ABNORMAL HIGH (ref 70–99)
Glucose-Capillary: 142 mg/dL — ABNORMAL HIGH (ref 70–99)

## 2021-08-28 LAB — MRSA NEXT GEN BY PCR, NASAL: MRSA by PCR Next Gen: NOT DETECTED

## 2021-08-28 LAB — HIV ANTIBODY (ROUTINE TESTING W REFLEX): HIV Screen 4th Generation wRfx: NONREACTIVE

## 2021-08-28 MED ORDER — SODIUM CHLORIDE 0.9% FLUSH
3.0000 mL | Freq: Two times a day (BID) | INTRAVENOUS | Status: DC
  Administered 2021-08-28 – 2021-08-31 (×8): 3 mL via INTRAVENOUS

## 2021-08-28 MED ORDER — DOXYCYCLINE HYCLATE 100 MG PO TABS
100.0000 mg | ORAL_TABLET | Freq: Two times a day (BID) | ORAL | Status: DC
  Administered 2021-08-28 – 2021-08-31 (×6): 100 mg via ORAL
  Filled 2021-08-28 (×6): qty 1

## 2021-08-28 MED ORDER — NOREPINEPHRINE 4 MG/250ML-% IV SOLN
2.0000 ug/min | INTRAVENOUS | Status: DC
  Administered 2021-08-28: 2 ug/min via INTRAVENOUS
  Filled 2021-08-28: qty 250

## 2021-08-28 MED ORDER — SODIUM CHLORIDE 0.9 % IV SOLN
250.0000 mL | INTRAVENOUS | Status: DC
  Administered 2021-08-28: 250 mL via INTRAVENOUS

## 2021-08-28 MED ORDER — DEXAMETHASONE SODIUM PHOSPHATE 10 MG/ML IJ SOLN
6.0000 mg | Freq: Every day | INTRAMUSCULAR | Status: DC
  Administered 2021-08-29 – 2021-08-31 (×3): 6 mg via INTRAVENOUS
  Filled 2021-08-28 (×3): qty 1

## 2021-08-28 MED ORDER — CHLORHEXIDINE GLUCONATE CLOTH 2 % EX PADS
6.0000 | MEDICATED_PAD | Freq: Every day | CUTANEOUS | Status: DC
  Administered 2021-08-28 – 2021-08-31 (×4): 6 via TOPICAL

## 2021-08-28 MED ORDER — HEPARIN SODIUM (PORCINE) 5000 UNIT/ML IJ SOLN
5000.0000 [IU] | Freq: Three times a day (TID) | INTRAMUSCULAR | Status: DC
  Administered 2021-08-28 – 2021-08-30 (×9): 5000 [IU] via SUBCUTANEOUS
  Filled 2021-08-28 (×9): qty 1

## 2021-08-28 MED ORDER — SODIUM CHLORIDE 0.9 % IV SOLN
100.0000 mg | Freq: Every day | INTRAVENOUS | Status: DC
  Administered 2021-08-29 – 2021-08-31 (×3): 100 mg via INTRAVENOUS
  Filled 2021-08-28 (×3): qty 20

## 2021-08-28 MED ORDER — DOCUSATE SODIUM 100 MG PO CAPS: 100.0000 mg | ORAL_CAPSULE | Freq: Two times a day (BID) | ORAL | Status: DC | PRN

## 2021-08-28 MED ORDER — INSULIN ASPART 100 UNIT/ML IJ SOLN
0.0000 [IU] | INTRAMUSCULAR | Status: DC
  Administered 2021-08-28 (×2): 2 [IU] via SUBCUTANEOUS
  Administered 2021-08-28: 3 [IU] via SUBCUTANEOUS
  Administered 2021-08-28: 5 [IU] via SUBCUTANEOUS
  Administered 2021-08-28: 2 [IU] via SUBCUTANEOUS
  Administered 2021-08-28 – 2021-08-29 (×2): 3 [IU] via SUBCUTANEOUS
  Administered 2021-08-29: 5 [IU] via SUBCUTANEOUS
  Administered 2021-08-29: 8 [IU] via SUBCUTANEOUS
  Administered 2021-08-29: 5 [IU] via SUBCUTANEOUS
  Administered 2021-08-29: 4 [IU] via SUBCUTANEOUS
  Administered 2021-08-30: 5 [IU] via SUBCUTANEOUS
  Administered 2021-08-30 (×2): 3 [IU] via SUBCUTANEOUS

## 2021-08-28 MED ORDER — POLYETHYLENE GLYCOL 3350 17 G PO PACK: 17.0000 g | PACK | Freq: Every day | ORAL | Status: DC | PRN

## 2021-08-28 MED ORDER — TOCILIZUMAB 400 MG/20ML IV SOLN
8.0000 mg/kg | Freq: Once | INTRAVENOUS | Status: AC
  Administered 2021-08-28: 688 mg via INTRAVENOUS
  Filled 2021-08-28: qty 34.4

## 2021-08-28 MED ORDER — FAMOTIDINE 20 MG PO TABS
40.0000 mg | ORAL_TABLET | Freq: Every day | ORAL | Status: DC
  Administered 2021-08-28 – 2021-08-30 (×3): 40 mg via ORAL
  Filled 2021-08-28 (×3): qty 2

## 2021-08-28 MED ORDER — SODIUM CHLORIDE 0.9 % IV SOLN
200.0000 mg | Freq: Once | INTRAVENOUS | Status: AC
  Administered 2021-08-28: 200 mg via INTRAVENOUS
  Filled 2021-08-28: qty 40

## 2021-08-28 MED ORDER — ACETAMINOPHEN 325 MG PO TABS
650.0000 mg | ORAL_TABLET | ORAL | Status: DC | PRN
  Administered 2021-08-28: 650 mg via ORAL
  Filled 2021-08-28: qty 2

## 2021-08-28 MED ORDER — MOMETASONE FURO-FORMOTEROL FUM 200-5 MCG/ACT IN AERO
2.0000 | INHALATION_SPRAY | Freq: Two times a day (BID) | RESPIRATORY_TRACT | Status: DC
  Administered 2021-08-28 – 2021-08-31 (×6): 2 via RESPIRATORY_TRACT
  Filled 2021-08-28 (×2): qty 8.8

## 2021-08-28 MED ORDER — PANTOPRAZOLE SODIUM 40 MG IV SOLR
40.0000 mg | INTRAVENOUS | Status: DC
  Administered 2021-08-28: 40 mg via INTRAVENOUS
  Filled 2021-08-28: qty 40

## 2021-08-28 MED ORDER — IPRATROPIUM-ALBUTEROL 0.5-2.5 (3) MG/3ML IN SOLN: 3.0000 mL | RESPIRATORY_TRACT | Status: DC | PRN
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
## 2021-08-28 NOTE — ED Notes (Signed)
Report given to rn on 3m 

## 2021-08-28 NOTE — Progress Notes (Signed)
Patient noted to have not voided since arrival to ED last night. Writer bladder scanned patient an scan showed to have >500 cc see flow sheet. Writer asked patient to try to urinate but patient was not able. Writer than implemented straight cath protocol. Patient had 700 cc output via straight cath. Patient tolerated well. Peri care performed and sterile technique maintained.

## 2021-08-28 NOTE — Progress Notes (Signed)
NAME:  Brenda Morgan, MRN:  [id number], DOB:  [birth year], LOS: 0 ADMISSION DATE:  08/27/2021, CONSULTATION DATE:  08/28/21 REFERRING MD:  Criss Alvine CHIEF COMPLAINT:  Dyspnea   History of Present Illness:  Brenda Morgan ,is a 65 y.o. female, who presented to the [facility] ED with a chief complaint of dyspnea. 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   She was diagnosed with COVID-19 on an home test on 8/31 after developing COVID like symptoms. She was prescribed paxlovid by her pcp. On 9/4 she deveolped more shortness of breath, wheezing, fatigue, and AMS. She was brought to the ED by her family. ED course was notable for COVID+ PCR, abg 7.31/59/53/30, she was started on BiPAP, given 6mg  of decadron. She developed hypotension and was given 1L IVF. She remains confused. PCCM was consulted for admission.  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   Significant Hospital Events: Including procedures, antibiotic start and stop dates in addition to other pertinent events   9/4: Presented to the ED 9/5: Transferred to ICU on BiPAP. Started on Decadron, Remdesivir, Tocilizumab  Interim History / Subjective:   Ms. Brenda Morgan denies any acute complaints, including SOB or chest pain. She is hoping to have the BiPAP removed so she can try eating.   Objective   Blood pressure (!) 103/52, pulse (!) 54, temperature 97.7 F (36.5 C), temperature source Oral, resp. rate 14, height 5\' 4"  (1.626 m), weight 89.5 kg, SpO2 98 %.    Vent Mode: BIPAP;PCV FiO2 (%):  [50 %] 50 % Set Rate:  [16 bmp] 16 bmp PEEP:  [6 cmH20] 6 cmH20   Intake/Output Summary (Last 24 hours) at 08/28/2021 0720 Last data filed at 08/28/2021 0646 Gross per 24 hour  Intake 1010 ml  Output 700 ml  Net 310 ml   Filed Weights   08/28/21 0100 08/28/21 0258  Weight: 86 kg 89.5 kg    Examination: General: No acute distress. Resting comfortably on BiPAP.  Lungs: Rhonchi diffusely with some expiratory wheezing. No increased work of breathing. No tachypnea.  Cardiovascular: Regular rate and rhythm. No murmurs.  Abdomen: Soft and non-tender, non-distended.  Extremities: Warm and dry. No pitting edema. Chronic hyperpigmentation changes present.  Neuro: Alert and oriented. Moving all extremities. No focal deficits.   Resolved Hospital Problem list   Encephalopathy  Assessment & Plan:   # Septic Shock  # COVID-19 Pneumonia  # Acute hypoxic and hypercapnic Respiratory Failure  # History of COPD # Hx of OSA  First positive test on 8/31 initially being treated at home with Paxlovid. ABG on arrival with hypercapnia and hypoxia, doing well on BiPAP. Pressor support was discontinued this AM and blood pressure is holding with MAP > 65.   - Trial off BiPAP with re-evaluation of ABG 1 hour after.  - Continue Decadron (Day 1 out of 10) and Remdesivir (Day 1 out of 5)  - Received Tocilizumab on 9/5 - Continue Dulera   # Acute Metabolic Encephalopathy Secondary to hypercapnia. Resolved.   # AKI: Pre-renal in the setting of infection/shock versus post-renal due to urinary retention. Received IVF resuscitation.  # Urinary Rentention: Question if secondary to encephalopathy. S/p in/out cath with 700 cc out. At risk for bladder stretch injury.  - Repeat BMP this afternoon - Bladder scans q8h - Foley catheter if > 300 cc on  bladder scan  # Mild Thrombocytopenia: Likely secondary to COVID-19 infection, however given hx of NASH, will monitor closely.  - Continue to trend platelets.   # Type 2 Diabetes Mellitus: On Pioglitazone, Ozempic, and Synjardy at home. Well controlled.  - SSI   # HTN: On Coreg 25 mg BID, Spironolactone 50 mg daily, Losartan 25 mg.  - Holding anti-hypertensives given pressor support was just recently weaned  # HFpEF (2018, EF 60-65%) # Chronic venous  stasis  - Holding home Furosemide and Metolazone in the setting of AKI . Euvolemic at this time.   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  - Will restart as blood pressure allows.   Best Practice (right click and "Reselect all SmartList Selections" daily)   Diet/type: Regular consistency (see orders) DVT prophylaxis: prophylactic heparin  GI prophylaxis: N/A Lines: N/A Foley:  N/A Code Status:  full code Last date of multidisciplinary goals of care discussion [N/A]  Labs   CBC: Recent Labs  Lab 08/27/21 2216 08/27/21 2235 08/28/21 0351  WBC 12.5*  --  10.3  NEUTROABS 9.7*  --   --   HGB 11.7* 10.9* 10.3*  HCT 37.7 32.0* 33.7*  MCV 95.0  --  94.7  PLT 129*  --  122*   Basic Metabolic Panel: Recent Labs  Lab 08/27/21 2216 08/27/21 2235 08/28/21 0351  NA 138 138  --   K 4.5 4.5  --   CL 102  --   --   CO2 28  --   --   GLUCOSE 155*  --   --   BUN 21  --   --   CREATININE 1.54*  --  1.75*  CALCIUM 8.8*  --   --    GFR: Estimated Creatinine Clearance: 35.2 mL/min (A) (by C-G formula based on SCr of 1.75 mg/dL (H)). Recent Labs  Lab 08/27/21 2216 08/27/21 2218 08/28/21 0351  WBC 12.5*  --  10.3  LATICACIDVEN  --  1.7  --    Liver Function Tests: Recent Labs  Lab 08/27/21 2216  AST 20  ALT 19  ALKPHOS 104  BILITOT 0.8  PROT 7.0  ALBUMIN 3.2*   No results for input(s): LIPASE, AMYLASE in the last 168 hours. Recent Labs  Lab 08/27/21 2220  AMMONIA 11   ABG    Component Value Date/Time   PHART 7.313 (L) 08/27/2021 2235   PCO2ART 59.6 (H) 08/27/2021 2235   PO2ART 53 (L) 08/27/2021 2235   HCO3 30.3 (H) 08/27/2021 2235   TCO2 32 08/27/2021 2235   O2SAT 84.0 08/27/2021 2235    Coagulation Profile: No results for input(s): INR, PROTIME in the last 168 hours.  Cardiac Enzymes: No results for input(s): CKTOTAL,  CKMB, CKMBINDEX, TROPONINI in the last 168 hours.  HbA1C: Hgb A1c MFr Bld  Date/Time Value Ref Range Status  08/28/2021 03:51 AM 5.9 (H) 4.8 - 5.6 % Final    Comment:    (NOTE) Pre diabetes:          5.7%-6.4%  Diabetes:              >6.4%  Glycemic control for   <7.0% adults with diabetes   05/01/2021 09:28 AM 5.9 (H) 4.8 - 5.6 % Final    Comment:    (NOTE) Pre diabetes:          5.7%-6.4%  Diabetes:              >  6.4%  Glycemic control for   <7.0% adults with diabetes    CBG: Recent Labs  Lab 08/27/21 2257 08/28/21 0016 08/28/21 0140 08/28/21 0258  GLUCAP 140* 142* 131* 147*   Review of Systems:   Negative except as noted above.   Past Medical History:  She,  has a past medical history of Anemia, Anxiety, Arthritis, COPD (chronic obstructive pulmonary disease) (HCC), Diabetes mellitus without complication (HCC), Fatty liver (2012), GERD (gastroesophageal reflux disease), Heart murmur, Hypertension, Pneumonia, and Sleep apnea.   Surgical History:   Past Surgical History:  Procedure Laterality Date   BACK SURGERY     carotid artery surgery Right 10/07/2012   carotid ultrasound  06/2015   CHOLECYSTECTOMY  1981   COLONOSCOPY  2014   DIAGNOSTIC MAMMOGRAM  03/2017   groin lypnoid Left 03/2015   LUMBAR WOUND DEBRIDEMENT N/A 08/02/2020   Procedure: IRRIGATE AND DEBRIDEMENT OF LUMBAR WOUND, PLACEMENT OF WOUND VAC;  Surgeon: Dawley, Alan Mulderroy C, DO;  Location: [facility] OR;  Service: Neurosurgery;  Laterality: N/A;   right heel spur  1994   SPINAL CORD STIMULATOR INSERTION N/A 05/03/2021   Procedure: Spinal Cord Stimulator Placement;  Surgeon: Renaldo FiddlerEichman, Dave S, MD;  [facility];  Service: Neurosurgery;  Laterality: N/A;   TUBAL LIGATION  1985   wrist trigger release Right 01/2016    Social History:   reports that she quit smoking about 4 months ago. Her smoking use included cigarettes. She smoked an average of 1 pack per day. She has never used smokeless tobacco. She reports  that she does not currently use alcohol. She reports that she does not currently use drugs.   Family History:  Her family history includes Diabetes in her father and mother; Heart disease in her father and mother; Lung cancer in her father; Stomach cancer in her mother.   Allergies Allergies  Allergen Reactions   Amlodipine Anaphylaxis, Swelling and Other (See Comments)    Tongue swelling    Naltrexone Hives   Penicillins Hives and Other (See Comments)    Tolerated ceftriaxone and cefepime 06/2017 Hives     Penicillin G     Home Medications  Prior to Admission medications   Medication Sig Start Date End Date Taking? Authorizing Provider  acetaminophen (TYLENOL) 500 MG tablet Take 1,000 mg by mouth every 6 (six) hours as needed for moderate pain, headache or fever.   Yes [provider]  azelastine (ASTELIN) 0.1 % nasal spray Place 2 sprays into both nostrils 2 (two) times daily as needed for rhinitis (congestion).  07/30/20  Yes [provider]  Biotin w/ Vitamins C & E (HAIR/SKIN/NAILS PO) Take 3 capsules by mouth daily. With Collagen   Yes [provider]  carvedilol (COREG) 25 MG tablet Take 25 mg by mouth daily.   Yes [provider]  cetirizine (ZYRTEC) 10 MG tablet Take 10 mg by mouth daily.   Yes [provider]  Cholecalciferol (VITAMIN D) 50 MCG (2000 UT) tablet Take 2,000 Units by mouth daily.   Yes [provider]  Continuous Blood Gluc Sensor (FREESTYLE LIBRE 14 DAY SENSOR) MISC Apply topically every 14 (fourteen) days. 08/17/21  Yes [provider]  Continuous Blood Gluc Sensor (FREESTYLE LIBRE 14 DAY SENSOR) MISC APPLY 1 SENSOR EVERY 14 DAYS AS DIRECTED 02/24/21  Yes [provider]  diclofenac Sodium (VOLTAREN) 1 % GEL Apply 4 g topically 4 (four) times daily. Patient taking differently: Apply 4 g topically 4 (four) times daily as needed (  pain). 09/23/20  Yes Melene Plan, DO  doxycycline (VIBRA-TABS) 100 MG  tablet Take 1 tablet (100 mg total) by mouth 2 (two) times daily. 11/07/20  Yes Blanchard Kelch, NP  DULoxetine (CYMBALTA) 30 MG capsule Take 30 mg by mouth daily.   Yes [provider]  Empagliflozin-metFORMIN HCl ER (SYNJARDY XR) 25-1000 MG TB24 Take 1 tablet by mouth daily.   Yes [provider]  famotidine (PEPCID) 40 MG tablet Take 40 mg by mouth at bedtime. 09/09/20  Yes [provider]  fentaNYL (DURAGESIC) 50 MCG/HR Place 1 patch onto the skin every 3 (three) days.   Yes [provider]  ferrous sulfate 325 (65 FE) MG tablet Take 325 mg by mouth daily with breakfast.   Yes [provider]  Fluticasone-Salmeterol (ADVAIR) 250-50 MCG/DOSE AEPB Inhale 1 puff into the lungs 2 (two) times daily as needed (shortness of breath/wheezing).    Yes [provider]  furosemide (LASIX) 40 MG tablet Take 40 mg by mouth every other day.   Yes [provider]  hydrOXYzine (ATARAX/VISTARIL) 25 MG tablet Take 25 mg by mouth at bedtime. 07/30/20  Yes [provider]  icosapent Ethyl (VASCEPA) 1 g capsule Take 2 g by mouth 2 (two) times daily.   Yes [provider]  lamoTRIgine (LAMICTAL) 150 MG tablet Take 150 mg by mouth See admin instructions. Take one tablet (150 mg) by mouth every night with a 25 mg tablet for a total dose of 175 mg daily   Yes [provider]  lamoTRIgine (LAMICTAL) 25 MG tablet Take 25 mg by mouth See admin instructions. Take one tablet (25 mg) by mouth every night with a 150 mg tablet for a total dose of 175 mg daily 07/21/20  Yes [provider]  losartan (COZAAR) 25 MG tablet Take 25 mg by mouth daily. 09/28/20  Yes [provider]  magnesium oxide (MAG-OX) 400 MG tablet Take 400 mg by mouth daily.   Yes [provider]  metolazone (ZAROXOLYN) 5 MG tablet Take 5 mg by mouth 3 (three) times a week. Sunday,Wednesday,friday   Yes [provider]  naloxegol oxalate  (MOVANTIK) 25 MG TABS tablet Take 25 mg by mouth daily.   Yes [provider]  oxyCODONE-acetaminophen (PERCOCET) 10-325 MG tablet Take 1 tablet by mouth every 8 (eight) hours as needed for pain.   Yes [provider]  OXYGEN Inhale 3 L into the lungs at bedtime. Use with BIPAP   Yes [provider]  OZEMPIC, 1 MG/DOSE, 4 MG/3ML SOPN Inject 1 mg into the skin once a week. Take on Sunday 09/14/20  Yes [provider]  pantoprazole (PROTONIX) 40 MG tablet Take 40 mg by mouth daily.   Yes [provider]  PAXLOVID, 150/100, 10 x 150 MG & 10 x 100MG  TBPK Take 2 tablets by mouth See admin instructions. bid x 5 days 08/24/21  Yes [provider]  pioglitazone (ACTOS) 15 MG tablet Take 15 mg by mouth daily.   Yes [provider]  potassium chloride SA (KLOR-CON) 20 MEQ tablet Take 20 mEq by mouth daily.   Yes [provider]  pramipexole (MIRAPEX) 0.5 MG tablet Take 1 mg by mouth at bedtime.   Yes [provider]  pregabalin (LYRICA) 200 MG capsule Take 200 mg by mouth in the morning, at noon, and at bedtime.   Yes [provider]  promethazine-dextromethorphan (PROMETHAZINE-DM) 6.25-15 MG/5ML syrup Take 5 mLs by mouth 4 (four)  times daily as needed for cough. 08/25/21  Yes [provider]  silver sulfADIAZINE (SILVADENE) 1 % cream Apply 1 application topically daily as needed (leg sores).    Yes [provider]  spironolactone (ALDACTONE) 50 MG tablet Take 50 mg by mouth daily.   Yes [provider]  traZODone (DESYREL) 100 MG tablet Take 100 mg by mouth at bedtime.   Yes [provider]  zinc gluconate 50 MG tablet Take 50 mg by mouth daily.   Yes [provider]  atorvastatin (LIPITOR) 10 MG tablet Take 5 mg by mouth daily.    [provider]   Dr. Verdene Lennert Internal Medicine PGY-2  08/28/2021, 12:39 PM

## 2021-08-28 NOTE — Progress Notes (Signed)
eLink Physician-Brief Progress Note Patient Name: Brenda Morgan DOB: [birth year] MRN: [id number]   Date of Service  08/28/2021  HPI/Events of Note  29F with OSA and COPD of ? Severity.  Here with covid and acute hypercapnia.  Was lethargic earlier but is now alert and appropriate.  Has been in shock, and remains on a low dose levophed  eICU Interventions  Pt to remain on bipap and on levo as long as MAP < 65.  Spoke to pt and bedside RN     Intervention Category Evaluation Type: New Patient Evaluation  [name] 08/28/2021, 3:08 AM

## 2021-08-28 NOTE — Progress Notes (Signed)
Patient taken off bipap and placed on 3L [place]. Vitals are stable. RN made aware. 

## 2021-08-28 NOTE — Progress Notes (Signed)
Patient arrived to unit at this time, alert and oriented x3. Patient on Bipap at time of arrival to ICU. Able to follow commands. Skin assessment performed with Clinical research associate and Nadeen Landau. Patient noted to have red sacrum non blanchable. Foam placed to sacral area. Patient also noted to have skin tare to right shin upon arrival. Foam in place. Patient also noted to have left lateral back incision where spinal chord stimulator is placed. All other skin WDL. ED RN contacted about patients glasses, writer made aware that patients husband took glasses, dentures and all patients belongings home. Patient noted to have 3 rings on left hand. Also has white pants at bedside. Patient oriented to unit and educated on how to use call light. Safety measures implemented and bed locked in lowest position. Dr. Eulah Citizen via E-link aware of patient arrival to unit.

## 2021-08-28 NOTE — Progress Notes (Signed)
RT transported to 86M 10.Pt was stable.Unit RT received report on pt.

## 2021-08-28 NOTE — Progress Notes (Signed)
RT note. Patient placed on bipap at this time for QHS, 16/6 R16 40%. Patient sat 96% and resting comfortable, RT will continue to monitor.

## 2021-08-28 NOTE — H&P (Signed)
NAME:  Brenda Morgan, MRN:  [id number], DOB:  [birth year], LOS: 0 ADMISSION DATE:  08/27/2021, CONSULTATION DATE:  9/5 REFERRING MD:  Criss Alvine, CHIEF COMPLAINT: dyspnea  History of Present Illness:  Patient is encephalopathic. Therefore history has been obtained from chart review and family at bedside.  Brenda Morgan ,is a 65 y.o. 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  She was diagnosed with COVID-19 on an home test on 8/31 after developing COVID like symptoms. She was prescribed paxlovid by her pcp. On 9/4 she deveolped more shortness of breath, wheezing, fatigue, and AMS. She was brought to the ED by her family  ED course was notable for COVID+ PCR, abg 7.31/59/53/30, she was started on BiPAP, given 6mg  of decadron. She developed hypotension and was given 1L IVF. She remains confused.  PCCM was consulted for admission.  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   Significant Hospital Events: Including procedures, antibiotic start and stop dates in addition to other pertinent events   9/4 presented to ED 9/5 6mg  decadron, tocilizumab, remdisivir   Interim History / Subjective:  See above  Unable to obtain subjective evaluation due to patient status  Objective   Blood pressure (!) 77/42, pulse (!) 55, temperature 98 F (36.7 C), temperature source Oral, resp. rate (!) 22, weight 86 kg, SpO2 99 %.    Vent Mode: BIPAP;PCV FiO2 (%):  [50 %] 50 % Set Rate:  [16 bmp] 16 bmp PEEP:  [6 cmH20] 6 cmH20  No intake or output data in the 24 hours ending 08/28/21 0125 Filed Weights   08/28/21 0100  Weight: 86 kg    Examination: General:  in bed, NAD,  unwell appearing, HEENT: MM pink, anicteric, atraumatic Neuro confused, follows commands,  MAE, RASS -2 CV: S1S2, SB, no m/r/g appreciated PULM:  Rhonchi in the upper lobes and in the lower lobes, Trachea midline, chest expansion symmetric GI: soft, bsx4 active, nontender  Extremities: warm/dry, no pretibial edema, capillary refill less than 3 seconds  Skin: no rashes or lesions noted  Resolved Hospital Problem list     Assessment & Plan:  COVID-19 Pneumonia Septic Shock- secondary to above Acute respiratory failure with hypoxia and hypercapnia-secondary to above HX COPD, OSA Covid positive on 8/31 on home test. Received outpatient paxlovid. CXR concerning for pneumonia. WBC 12.5. ABG with respiratory acidosis. 7.31/59/53/30. 1L IVF given in ED. Lactate 1.7. No st changes on EKG. BNP 302. -Admit to ICU -Continue BiPAP at this time -Received 6mg  decadron in ED -Start Remdesivir -Dr. 10/28/21 discussed the risks and benefits of tocilizumab with husband. Inclusion criteria reviewed. Proceeding with tocilizumab infusion. -Start peripheral levophed. Goal MAP 65 or greater. Titrate to goal. 9/31, PRN Duonebs -Follow up blood cultures  Acute Metabolic Encephalopathy Suspect secondary to infection and hypercarbia. Ammonia 11. LFT WNL -monitor neuro exam -limit sedating agents  Acute kidney injury Creat 1.54, baseline 0.95 -Ensure renal perfusion. Goal MAP 65 or greater. -Avoid neprotoxic drugs as possible. -Strict I&O's -Follow up AM creatinine  Thrombocytopenia PLT 129. Suspect secondary to infection -If no signs of active bleeding, PLT transfusion for platelets less than 10K -If signs of active bleeding or surgical procedure indicated, PLT transfusion for platelets less than 50K -Follow up AM PLT levels -Monitor for signs  of active bleeding  DMII -Blood Glucose goal 140-180. 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  Best Practice (right click and "Reselect all SmartList Selections" daily)   Diet/type: NPO DVT prophylaxis: prophylactic heparin  GI prophylaxis: PPI Lines: N/A Foley:  N/A Code Status:  full code Last date of multidisciplinary goals of care discussion [Full scope of care 9/5]  Labs   CBC: Recent Labs  Lab 08/27/21 2216 08/27/21 2235  WBC 12.5*  --   NEUTROABS 9.7*  --   HGB 11.7* 10.9*  HCT 37.7 32.0*  MCV 95.0  --   PLT 129*  --     Basic Metabolic Panel: Recent Labs  Lab 08/27/21 2216 08/27/21 2235  NA 138 138  K 4.5 4.5  CL 102  --   CO2 28  --   GLUCOSE 155*  --   BUN 21  --   CREATININE 1.54*  --   CALCIUM 8.8*  --    GFR: Estimated Creatinine Clearance: 40.4 mL/min (A) (by C-G formula based on SCr of 1.54 mg/dL (H)). Recent Labs  Lab 08/27/21 2216 08/27/21 2218  WBC 12.5*  --   LATICACIDVEN  --  1.7    Liver Function Tests: Recent Labs  Lab 08/27/21 2216  AST 20  ALT 19  ALKPHOS 104  BILITOT 0.8  PROT 7.0  ALBUMIN 3.2*   No results for input(s): LIPASE, AMYLASE in the last 168 hours. Recent Labs  Lab 08/27/21 2220  AMMONIA 11    ABG    Component Value Date/Time   PHART 7.313 (L) 08/27/2021 2235   PCO2ART 59.6 (H) 08/27/2021 2235   PO2ART 53 (L) 08/27/2021 2235   HCO3 30.3 (H) 08/27/2021 2235   TCO2 32 08/27/2021 2235   O2SAT 84.0 08/27/2021 2235     Coagulation Profile: No results for input(s): INR, PROTIME in the last 168 hours.  Cardiac Enzymes: No results for input(s): CKTOTAL, CKMB, CKMBINDEX, TROPONINI in the last 168 hours.  HbA1C: Hgb A1c MFr Bld  Date/Time Value Ref Range Status  05/01/2021 09:28 AM 5.9 (H) 4.8 - 5.6 % Final    Comment:    (NOTE) Pre diabetes:          5.7%-6.4%  Diabetes:              >6.4%  Glycemic control for   <7.0% adults with diabetes   10/14/2020 12:04 AM 6.4 (H) 4.8 - 5.6 % Final    Comment:    (NOTE)         Prediabetes: 5.7 - 6.4          Diabetes: >6.4         Glycemic control for adults with diabetes: <7.0     CBG: Recent Labs  Lab 08/27/21 2257 08/28/21 0016  GLUCAP 140* 142*    Review of Systems:   Unable to complete ROS due to patient mental status and bipap  Past Medical History:  She,  has a past medical history of Anemia, Anxiety, Arthritis, COPD (chronic obstructive pulmonary disease) (HCC), Diabetes mellitus without complication (HCC), Fatty liver (2012), GERD (gastroesophageal reflux disease), Heart murmur, Hypertension, Pneumonia, and Sleep apnea.   Surgical History:   Past Surgical History:  Procedure Laterality Date   BACK SURGERY     carotid artery surgery Right 10/07/2012   carotid  ultrasound  06/2015   CHOLECYSTECTOMY  1981   COLONOSCOPY  2014   DIAGNOSTIC MAMMOGRAM  03/2017   groin lypnoid Left 03/2015   LUMBAR WOUND DEBRIDEMENT N/A 08/02/2020   Procedure: IRRIGATE AND DEBRIDEMENT OF LUMBAR WOUND, PLACEMENT OF WOUND VAC;  Surgeon: Dawley, Alan Mulder, DO;  Location: [facility] OR;  Service: Neurosurgery;  Laterality: N/A;   right heel spur  1994   SPINAL CORD STIMULATOR INSERTION N/A 05/03/2021   Procedure: Spinal Cord Stimulator Placement;  Surgeon: Renaldo Fiddler, MD;  [facility];  Service: Neurosurgery;  Laterality: N/A;   TUBAL LIGATION  1985   wrist trigger release Right 01/2016     Social History:   reports that she quit smoking about 4 months ago. Her smoking use included cigarettes. She smoked an average of 1 pack per day. She has never used smokeless tobacco. She reports that she does not currently use alcohol. She reports that she does not currently use drugs.   Family History:  Her family history includes Diabetes in her father and mother; Heart disease in her father and mother; Lung cancer in her father; Stomach cancer in her mother.   Allergies Allergies  Allergen Reactions   Amlodipine Anaphylaxis, Swelling and Other (See Comments)    Tongue swelling    Naltrexone Hives    Penicillins Hives and Other (See Comments)    Tolerated ceftriaxone and cefepime 06/2017 Hives     Penicillin G      Home Medications  Prior to Admission medications   Medication Sig Start Date End Date Taking? Authorizing Provider  acetaminophen (TYLENOL) 500 MG tablet Take 1,000 mg by mouth every 6 (six) hours as needed for moderate pain, headache or fever.   Yes [provider]  azelastine (ASTELIN) 0.1 % nasal spray Place 2 sprays into both nostrils 2 (two) times daily as needed for rhinitis (congestion).  07/30/20  Yes [provider]  Biotin w/ Vitamins C & E (HAIR/SKIN/NAILS PO) Take 3 capsules by mouth daily. With Collagen   Yes [provider]  carvedilol (COREG) 25 MG tablet Take 25 mg by mouth daily.   Yes [provider]  cetirizine (ZYRTEC) 10 MG tablet Take 10 mg by mouth daily.   Yes [provider]  Cholecalciferol (VITAMIN D) 50 MCG (2000 UT) tablet Take 2,000 Units by mouth daily.   Yes [provider]  Continuous Blood Gluc Sensor (FREESTYLE LIBRE 14 DAY SENSOR) MISC Apply topically every 14 (fourteen) days. 08/17/21  Yes [provider]  Continuous Blood Gluc Sensor (FREESTYLE LIBRE 14 DAY SENSOR) MISC APPLY 1 SENSOR EVERY 14 DAYS AS DIRECTED 02/24/21  Yes [provider]  diclofenac Sodium (VOLTAREN) 1 % GEL Apply 4 g topically 4 (four) times daily. Patient taking differently: Apply 4 g topically 4 (four) times daily as needed (pain). 09/23/20  Yes Melene Plan, DO  doxycycline (VIBRA-TABS) 100 MG tablet Take 1 tablet (100 mg total) by mouth 2 (two) times daily. 11/07/20  Yes Blanchard Kelch, NP  DULoxetine (CYMBALTA) 30 MG capsule Take 30 mg by mouth daily.   Yes [provider]  Empagliflozin-metFORMIN HCl ER (SYNJARDY XR) 25-1000 MG TB24 Take 1 tablet by mouth daily.   Yes [provider]  famotidine (PEPCID) 40 MG tablet Take 40 mg by mouth at bedtime. 09/09/20  Yes [provider]  fentaNYL (DURAGESIC) 50 MCG/HR Place 1 patch onto the skin every 3 (three) days.   Yes [provider]  ferrous  sulfate 325 (65 FE) MG tablet Take 325 mg by mouth daily with breakfast.   Yes [provider]  Fluticasone-Salmeterol (ADVAIR) 250-50 MCG/DOSE AEPB Inhale 1 puff into the lungs 2 (two) times daily as needed (shortness of breath/wheezing).    Yes [provider]  furosemide (LASIX) 40 MG tablet Take 40 mg by mouth every other day.   Yes [provider]  hydrOXYzine (ATARAX/VISTARIL) 25 MG tablet Take 25 mg by mouth at bedtime. 07/30/20  Yes [provider]  icosapent Ethyl (VASCEPA) 1 g capsule Take 2 g by mouth 2 (two) times daily.   Yes [provider]  lamoTRIgine (LAMICTAL) 150 MG tablet Take 150 mg by mouth See admin instructions. Take one tablet (150 mg) by mouth every night with a 25 mg tablet for a total dose of 175 mg daily   Yes [provider]  lamoTRIgine (LAMICTAL) 25 MG tablet Take 25 mg by mouth See admin instructions. Take one tablet (25 mg) by mouth every night with a 150 mg tablet for a total dose of 175 mg daily 07/21/20  Yes [provider]  losartan (COZAAR) 25 MG tablet Take 25 mg by mouth daily. 09/28/20  Yes [provider]  magnesium oxide (MAG-OX) 400 MG tablet Take 400 mg by mouth daily.   Yes [provider]  metolazone (ZAROXOLYN) 5 MG tablet Take 5 mg by mouth 3 (three) times a week. Sunday,Wednesday,friday   Yes [provider]  naloxegol oxalate (MOVANTIK) 25 MG TABS tablet Take 25 mg by mouth daily.   Yes [provider]  oxyCODONE-acetaminophen (PERCOCET) 10-325 MG tablet Take 1 tablet by mouth every 8 (eight) hours as needed for pain.   Yes [provider]  OXYGEN Inhale 3 L into the lungs at bedtime. Use with BIPAP   Yes [provider]  OZEMPIC, 1 MG/DOSE, 4 MG/3ML SOPN Inject 1 mg into the skin once a week. Take on  Sunday 09/14/20  Yes [provider]  pantoprazole (PROTONIX) 40 MG tablet Take 40 mg by mouth daily.   Yes [provider]  PAXLOVID, 150/100, 10 x 150 MG & 10 x 100MG  TBPK Take 2 tablets by mouth See admin instructions. bid x 5 days 08/24/21  Yes [provider]  pioglitazone (ACTOS) 15 MG tablet Take 15 mg by mouth daily.   Yes [provider]  potassium chloride SA (KLOR-CON) 20 MEQ tablet Take 20 mEq by mouth daily.   Yes [provider]  pramipexole (MIRAPEX) 0.5 MG tablet Take 1 mg by mouth at bedtime.   Yes [provider]  pregabalin (LYRICA) 200 MG capsule Take 200 mg by mouth in the morning, at noon, and at bedtime.   Yes [provider]  promethazine-dextromethorphan (PROMETHAZINE-DM) 6.25-15 MG/5ML syrup Take 5 mLs by mouth 4 (four) times daily as needed for cough. 08/25/21  Yes [provider]  silver sulfADIAZINE (SILVADENE) 1 % cream Apply 1 application topically daily as needed (leg sores).    Yes [provider]  spironolactone (ALDACTONE) 50 MG tablet Take 50 mg by mouth daily.   Yes [provider]  traZODone (DESYREL) 100 MG tablet Take 100 mg by mouth at bedtime.   Yes [provider]  zinc gluconate 50 MG tablet Take 50 mg by mouth daily.   Yes [provider]  atorvastatin (LIPITOR) 10 MG tablet Take 5 mg by mouth daily.    [provider]     Critical care  time: 4237 Minutes    Gershon Musselimothy Scott Kyro Joswick, Jr., MSN, APRN, AGACNP-BC [facility]  08/28/2021 , 1:25 AM  Please see [email or web address] for pager details  If no response, please call [phone] After hours, please call Elink at [phone]

## 2021-08-29 ENCOUNTER — Encounter (HOSPITAL_COMMUNITY): Payer: Self-pay | Admitting: Pulmonary Disease

## 2021-08-29 DIAGNOSIS — R6521 Severe sepsis with septic shock: Secondary | ICD-10-CM

## 2021-08-29 DIAGNOSIS — A419 Sepsis, unspecified organism: Secondary | ICD-10-CM

## 2021-08-29 LAB — COMPREHENSIVE METABOLIC PANEL
ALT: 17 U/L (ref 0–44)
AST: 13 U/L — ABNORMAL LOW (ref 15–41)
Albumin: 2.9 g/dL — ABNORMAL LOW (ref 3.5–5.0)
Alkaline Phosphatase: 92 U/L (ref 38–126)
Anion gap: 6 (ref 5–15)
BUN: 38 mg/dL — ABNORMAL HIGH (ref 8–23)
CO2: 28 mmol/L (ref 22–32)
Calcium: 8.8 mg/dL — ABNORMAL LOW (ref 8.9–10.3)
Chloride: 103 mmol/L (ref 98–111)
Creatinine, Ser: 1.62 mg/dL — ABNORMAL HIGH (ref 0.44–1.00)
GFR, Estimated: 35 mL/min — ABNORMAL LOW (ref >60)
Glucose, Bld: 165 mg/dL — ABNORMAL HIGH (ref 70–99)
Potassium: 4.7 mmol/L (ref 3.5–5.1)
Sodium: 137 mmol/L (ref 135–145)
Total Bilirubin: 0.4 mg/dL (ref 0.3–1.2)
Total Protein: 6.4 g/dL — ABNORMAL LOW (ref 6.5–8.1)

## 2021-08-29 LAB — CBC WITH DIFFERENTIAL/PLATELET
Abs Immature Granulocytes: 0.09 10*3/uL — ABNORMAL HIGH (ref 0.00–0.07)
Basophils Absolute: 0 10*3/uL (ref 0.0–0.1)
Basophils Relative: 0 %
Eosinophils Absolute: 0 10*3/uL (ref 0.0–0.5)
Eosinophils Relative: 0 %
HCT: 33.8 % — ABNORMAL LOW (ref 36.0–46.0)
Hemoglobin: 10.7 g/dL — ABNORMAL LOW (ref 12.0–15.0)
Immature Granulocytes: 2 %
Lymphocytes Relative: 20 %
Lymphs Abs: 1.2 10*3/uL (ref 0.7–4.0)
MCH: 29.2 pg (ref 26.0–34.0)
MCHC: 31.7 g/dL (ref 30.0–36.0)
MCV: 92.3 fL (ref 80.0–100.0)
Monocytes Absolute: 0.4 10*3/uL (ref 0.1–1.0)
Monocytes Relative: 6 %
Neutro Abs: 4.4 10*3/uL (ref 1.7–7.7)
Neutrophils Relative %: 72 %
Platelets: 146 10*3/uL — ABNORMAL LOW (ref 150–400)
RBC: 3.66 MIL/uL — ABNORMAL LOW (ref 3.87–5.11)
RDW: 13.2 % (ref 11.5–15.5)
WBC: 6 10*3/uL (ref 4.0–10.5)
nRBC: 0 % (ref 0.0–0.2)

## 2021-08-29 LAB — GLUCOSE, CAPILLARY
Glucose-Capillary: 185 mg/dL — ABNORMAL HIGH (ref 70–99)
Glucose-Capillary: 144 mg/dL — ABNORMAL HIGH (ref 70–99)
Glucose-Capillary: 232 mg/dL — ABNORMAL HIGH (ref 70–99)
Glucose-Capillary: 228 mg/dL — ABNORMAL HIGH (ref 70–99)
Glucose-Capillary: 285 mg/dL — ABNORMAL HIGH (ref 70–99)

## 2021-08-29 LAB — MAGNESIUM: Magnesium: 1.9 mg/dL (ref 1.7–2.4)

## 2021-08-29 MED ORDER — FENTANYL 50 MCG/HR TD PT72
1.0000 | MEDICATED_PATCH | TRANSDERMAL | Status: DC
  Administered 2021-08-29: 1 via TRANSDERMAL
  Filled 2021-08-29: qty 1

## 2021-08-29 MED ORDER — ATORVASTATIN CALCIUM 10 MG PO TABS
5.0000 mg | ORAL_TABLET | Freq: Every day | ORAL | Status: DC
  Administered 2021-08-29 – 2021-08-31 (×3): 5 mg via ORAL
  Filled 2021-08-29 (×3): qty 1

## 2021-08-29 MED ORDER — LOSARTAN POTASSIUM 25 MG PO TABS
25.0000 mg | ORAL_TABLET | Freq: Every day | ORAL | Status: DC
  Administered 2021-08-29 – 2021-08-31 (×3): 25 mg via ORAL
  Filled 2021-08-29 (×3): qty 1

## 2021-08-29 MED ORDER — OXYCODONE-ACETAMINOPHEN 10-325 MG PO TABS
1.0000 | ORAL_TABLET | Freq: Three times a day (TID) | ORAL | Status: DC | PRN
Start: 2021-08-29 — End: 2021-08-29

## 2021-08-29 MED ORDER — HYDROXYZINE HCL 25 MG PO TABS
25.0000 mg | ORAL_TABLET | Freq: Every day | ORAL | Status: DC
  Administered 2021-08-29 – 2021-08-30 (×2): 25 mg via ORAL
  Filled 2021-08-29 (×2): qty 1

## 2021-08-29 MED ORDER — LAMOTRIGINE 25 MG PO TABS
175.0000 mg | ORAL_TABLET | Freq: Every day | ORAL | Status: DC
  Administered 2021-08-29 – 2021-08-30 (×2): 175 mg via ORAL
  Filled 2021-08-29 (×3): qty 1

## 2021-08-29 MED ORDER — FUROSEMIDE 40 MG PO TABS
40.0000 mg | ORAL_TABLET | ORAL | Status: DC
  Administered 2021-08-30: 40 mg via ORAL
  Filled 2021-08-29 (×3): qty 1

## 2021-08-29 MED ORDER — ZINC GLUCONATE 50 MG PO TABS: 50.0000 mg | ORAL_TABLET | Freq: Every day | ORAL | Status: DC

## 2021-08-29 MED ORDER — FREESTYLE LIBRE 14 DAY SENSOR MISC: Status: DC

## 2021-08-29 MED ORDER — DICLOFENAC SODIUM 1 % EX GEL
4.0000 g | Freq: Four times a day (QID) | CUTANEOUS | Status: DC | PRN
  Administered 2021-08-29: 4 g via TOPICAL
  Filled 2021-08-29: qty 100

## 2021-08-29 MED ORDER — OXYCODONE HCL 5 MG PO TABS
5.0000 mg | ORAL_TABLET | Freq: Three times a day (TID) | ORAL | Status: DC | PRN
Start: 2021-08-29 — End: 2021-08-31
  Administered 2021-08-29: 5 mg via ORAL
  Filled 2021-08-29: qty 1

## 2021-08-29 MED ORDER — MAGNESIUM OXIDE -MG SUPPLEMENT 400 (240 MG) MG PO TABS
400.0000 mg | ORAL_TABLET | Freq: Every day | ORAL | Status: DC
  Administered 2021-08-29 – 2021-08-31 (×3): 400 mg via ORAL
  Filled 2021-08-29 (×5): qty 1

## 2021-08-29 MED ORDER — OXYCODONE-ACETAMINOPHEN 5-325 MG PO TABS
1.0000 | ORAL_TABLET | Freq: Three times a day (TID) | ORAL | Status: DC | PRN
  Administered 2021-08-29 – 2021-08-30 (×3): 1 via ORAL
  Filled 2021-08-29 (×3): qty 1

## 2021-08-29 MED ORDER — LAMOTRIGINE 100 MG PO TABS: 150.0000 mg | ORAL_TABLET | ORAL | Status: DC

## 2021-08-29 MED ORDER — POTASSIUM CHLORIDE CRYS ER 20 MEQ PO TBCR
20.0000 meq | EXTENDED_RELEASE_TABLET | Freq: Every day | ORAL | Status: DC
  Administered 2021-08-29 – 2021-08-30 (×2): 20 meq via ORAL
  Filled 2021-08-29 (×2): qty 1

## 2021-08-29 MED ORDER — PIOGLITAZONE HCL 15 MG PO TABS
15.0000 mg | ORAL_TABLET | Freq: Every day | ORAL | Status: DC
  Administered 2021-08-29 – 2021-08-31 (×3): 15 mg via ORAL
  Filled 2021-08-29 (×3): qty 1

## 2021-08-29 MED ORDER — VITAMIN D 25 MCG (1000 UNIT) PO TABS
2000.0000 [IU] | ORAL_TABLET | Freq: Every day | ORAL | Status: DC
  Administered 2021-08-29 – 2021-08-31 (×3): 2000 [IU] via ORAL
  Filled 2021-08-29 (×3): qty 2

## 2021-08-29 MED ORDER — LAMOTRIGINE 25 MG PO TABS: 25.0000 mg | ORAL_TABLET | ORAL | Status: DC

## 2021-08-29 MED ORDER — ICOSAPENT ETHYL 1 G PO CAPS
2.0000 g | ORAL_CAPSULE | Freq: Two times a day (BID) | ORAL | Status: DC
  Administered 2021-08-29 – 2021-08-31 (×5): 2 g via ORAL
  Filled 2021-08-29 (×6): qty 2

## 2021-08-29 MED ORDER — DULOXETINE HCL 30 MG PO CPEP
30.0000 mg | ORAL_CAPSULE | Freq: Every day | ORAL | Status: DC
  Administered 2021-08-29 – 2021-08-31 (×3): 30 mg via ORAL
  Filled 2021-08-29 (×3): qty 1

## 2021-08-29 MED ORDER — AZELASTINE HCL 0.1 % NA SOLN: 2.0000 | Freq: Two times a day (BID) | NASAL | Status: DC | PRN

## 2021-08-29 MED ORDER — PANTOPRAZOLE SODIUM 40 MG PO TBEC
40.0000 mg | DELAYED_RELEASE_TABLET | Freq: Every day | ORAL | Status: DC
  Administered 2021-08-29 – 2021-08-31 (×3): 40 mg via ORAL
  Filled 2021-08-29 (×3): qty 1

## 2021-08-29 MED ORDER — FERROUS SULFATE 325 (65 FE) MG PO TABS
325.0000 mg | ORAL_TABLET | Freq: Every day | ORAL | Status: DC
  Administered 2021-08-30 – 2021-08-31 (×2): 325 mg via ORAL
  Filled 2021-08-29 (×2): qty 1

## 2021-08-29 MED ORDER — SEMAGLUTIDE (1 MG/DOSE) 4 MG/3ML ~~LOC~~ SOPN: 1.0000 mg | PEN_INJECTOR | SUBCUTANEOUS | Status: DC

## 2021-08-29 MED ORDER — PREGABALIN 50 MG PO CAPS
50.0000 mg | ORAL_CAPSULE | Freq: Three times a day (TID) | ORAL | Status: DC
  Administered 2021-08-29 – 2021-08-31 (×7): 50 mg via ORAL
  Filled 2021-08-29 (×7): qty 1

## 2021-08-29 MED ORDER — PRAMIPEXOLE DIHYDROCHLORIDE 0.25 MG PO TABS
1.0000 mg | ORAL_TABLET | Freq: Every day | ORAL | Status: DC
  Administered 2021-08-29 – 2021-08-30 (×2): 1 mg via ORAL
  Filled 2021-08-29: qty 1
  Filled 2021-08-29 (×2): qty 4

## 2021-08-29 MED ORDER — ACETAMINOPHEN 500 MG PO TABS: 1000.0000 mg | ORAL_TABLET | Freq: Four times a day (QID) | ORAL | Status: DC | PRN

## 2021-08-29 MED ORDER — TRAZODONE HCL 100 MG PO TABS
100.0000 mg | ORAL_TABLET | Freq: Every day | ORAL | Status: DC
  Administered 2021-08-29 – 2021-08-30 (×2): 100 mg via ORAL
  Filled 2021-08-29 (×2): qty 1

## 2021-08-29 MED ORDER — CARVEDILOL 25 MG PO TABS: 25.0000 mg | ORAL_TABLET | Freq: Every day | ORAL | Status: DC

## 2021-08-29 NOTE — Progress Notes (Signed)
RT note. Patient taken off bipap at this time and placed on 3L [place], RT will continue to monitor

## 2021-08-29 NOTE — Plan of Care (Signed)

## 2021-08-29 NOTE — Progress Notes (Signed)
eLink Physician-Brief Progress Note Patient Name: Brenda Morgan DOB: 03-Dec-1956 MRN: [id number]   Date of Service  08/29/2021  HPI/Events of Note  Bladder residuals were > 700  eICU Interventions  Foley ordered     Intervention Category Intermediate Interventions: Oliguria - evaluation and management  [name] 08/29/2021, 6:04 AM

## 2021-08-29 NOTE — Progress Notes (Signed)
PROGRESS NOTE  Brenda CorollaVenetia Morgan ZOX:[id number] DOB: February 14, 1956 DOA: 08/27/2021 PCP: Medicine, [facility]   LOS: 1 day   Brief narrative:  Brenda Morgan ,is a 65 y.o. 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  Patient was diagnosed to be having COVID-19 on home test on 831 after developing flulike symptoms.  She was prescribed paxlovid by her pcp. On 9/4 she deveolped more shortness of breath, wheezing, fatigue, and altered mental status.  She was brought to the ED by her family. ED course was notable for COVID+ PCR, abg 7.31/59/53/30, she was started on BiPAP, given 6mg  of decadron. She developed hypotension and was given 1L IVF.  Patient remained confused to critical care was consulted and patient was admitted to hospital.  Patient was gradually weaned off BiPAP and was transferred back to the floor.  Assessment/Plan:  Active Problems:   COVID-19  COVID-pneumonia with hypoxic respiratory failure.   Was initially on BiPAP.  Has been weaned to nasal cannula at this time.  Continue Decadron day 2 of 10, remdesivir day 2 of 5.  Received  Tocilizumab on 9/5. Continue Dulera.,  Blood cultures are negative in 2 days.  On 3 L of oxygen by nasal cannula.   Acute Metabolic Encephalopathy Likely secondary to hypercapnic respiratory failure.  Improved at this time.  Continue oxygen supplementation.  Continue to monitor closely.   AKI:  Likely secondary to septic shock and urinary retention..  Received IV fluid resuscitation.  Creatinine of 1.6 today.  We will continue to monitor closely.  Avoid diuretics for now.  Urinary Rentention: Continue bladder scans.  Continue to monitor closely.   Mild Thrombocytopenia:  Secondary to  COVID-19 infection.  History of NASH, continue to monitor platelets  Type 2 Diabetes Mellitus: On  Pioglitazone, Ozempic, and Synjardy at home.  Continue sliding scale insulin Accu-Cheks diabetic diet.  POC glucose of 232.   Essential HTN:  On Coreg 25 mg BID, Spironolactone 50 mg daily, Losartan 25 mg.  Will hold antihypertensives if blood pressure is low or heart rate is low.  Continue to hold     HFpEF (2018, EF 60-65%) Continue to monitor closely.  Chronic venous stasis  Continue to hold the Lasix and metolazone for now.  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   Chronic Pain Syndrome: On chronic Percocet 10-325 mg daily PRN and Fentanyl 50 mcg/hr patch.  Will be resumed on Percocet today.   DVT prophylaxis: heparin injection 5,000 Units Start: 08/28/21 0115 SCDs Start: 08/28/21 0111   Code Status: Full code  Family Communication: None  Status is: Inpatient  Remains inpatient appropriate because:Unsafe d/c plan, IV treatments appropriate due to intensity of illness or inability to take PO, and Inpatient level of care appropriate due to severity of illness  Dispo: The patient is from: Home              Anticipated d/c is to: Home              Patient currently is not medically stable to d/c.   Difficult to place patient No   Consultants: None  Procedures: None  Anti-infectives:  Remdesivir Doxycycline  Anti-infectives (From admission, onward)    Start     Dose/Rate Route Frequency Ordered Stop   08/29/21 1000  remdesivir 100 mg in sodium chloride 0.9 % 100 mL  IVPB       See Hyperspace for full Linked Orders Report.   100 mg 200 mL/hr over 30 Minutes Intravenous Daily 08/28/21 0112 09/02/21 0959   08/28/21 2200  doxycycline (VIBRA-TABS) tablet 100 mg        100 mg Oral 2 times daily 08/28/21 1248     08/28/21 0200  remdesivir 200 mg in sodium chloride 0.9% 250 mL IVPB       See Hyperspace for full Linked Orders Report.   200 mg 580 mL/hr over 30 Minutes Intravenous Once 08/28/21 0112 08/28/21 0305       Subjective: Today, patient was seen and examined at bedside.  States that she feels a little better today feels anxious and wishes to be resumed on her home medications.  Denies any chest pain, dizziness, lightheadedness.  Objective: Vitals:   08/29/21 1127 08/29/21 1200  BP:  (!) 155/63  Pulse:  (!) 57  Resp:  14  Temp: 98.4 F (36.9 C)   SpO2:  99%    Intake/Output Summary (Last 24 hours) at 08/29/2021 1317 Last data filed at 08/29/2021 1200 Gross per 24 hour  Intake 499.87 ml  Output 2275 ml  Net -1775.13 ml   Filed Weights   08/28/21 0100 08/28/21 0258  Weight: 86 kg 89.5 kg   Body mass index is 33.87 kg/m.   Physical Exam:  GENERAL: Patient is alert awake and oriented. Not in obvious distress.  Obese, on nasal cannula oxygen HENT: No scleral pallor or icterus. Pupils equally reactive to light. Oral mucosa is moist NECK: is supple, no gross swelling noted. CHEST:   Diminished breath sounds bilaterally.  Mild rhonchi noted CVS: S1 and S2 heard, no murmur. Regular rate and rhythm.  ABDOMEN: Soft, non-tender, bowel sounds are present. EXTREMITIES: No edema. CNS: Cranial nerves are intact. No focal motor deficits. SKIN: warm and dry without rashes.  Data Review: I have personally reviewed the following laboratory data and studies,  CBC: Recent Labs  Lab 08/27/21 2216 08/27/21 2235 08/28/21 0351 08/29/21 0221  WBC 12.5*  --  10.3 6.0  NEUTROABS 9.7*  --   --  4.4  HGB 11.7* 10.9* 10.3* 10.7*  HCT 37.7 32.0* 33.7* 33.8*  MCV 95.0  --  94.7 92.3  PLT 129*  --  122* 146*   Basic Metabolic Panel: Recent Labs  Lab 08/27/21 2216 08/27/21 2235 08/28/21 0351 08/28/21 1522 08/29/21 0221  NA 138 138  --  136 137  K 4.5 4.5  --  4.6 4.7  CL 102  --   --  100 103  CO2 28  --   --  27 28  GLUCOSE 155*  --   --  149* 165*  BUN 21  --   --  29* 38*  CREATININE 1.54*  --  1.75* 1.65* 1.62*  CALCIUM 8.8*  --   --  8.6* 8.8*  MG  --   --   --   --  1.9   Liver  Function Tests: Recent Labs  Lab 08/27/21 2216 08/29/21 0221  AST 20 13*  ALT 19 17  ALKPHOS 104 92  BILITOT 0.8 0.4  PROT 7.0 6.4*  ALBUMIN 3.2* 2.9*   No results for input(s): LIPASE, AMYLASE in the last 168 hours. Recent Labs  Lab 08/27/21 2220  AMMONIA 11   Cardiac Enzymes: No results for input(s): CKTOTAL, CKMB, CKMBINDEX, TROPONINI in the last 168 hours. BNP (last 3 results) Recent Labs    08/27/21  2218  BNP 302.3*    ProBNP (last 3 results) No results for input(s): PROBNP in the last 8760 hours.  CBG: Recent Labs  Lab 08/28/21 1910 08/28/21 2354 08/29/21 0404 08/29/21 0809 08/29/21 1124  GLUCAP 225* 167* 185* 144* 232*   Recent Results (from the past 240 hour(s))  Culture, blood (routine x 2)     Status: None (Preliminary result)   Collection Time: 08/27/21 10:18 PM   Specimen: BLOOD RIGHT FOREARM  Result Value Ref Range Status   Specimen Description BLOOD RIGHT FOREARM  Final   Special Requests   Final    BOTTLES DRAWN AEROBIC AND ANAEROBIC Blood Culture results may not be optimal due to an inadequate volume of blood received in culture bottles   Culture   Final    NO GROWTH 2 DAYS Performed at [facility], [address], [place], [place] [address]    Report Status PENDING  Incomplete  Culture, blood (routine x 2)     Status: None (Preliminary result)   Collection Time: 08/27/21 10:35 PM   Specimen: BLOOD  Result Value Ref Range Status   Specimen Description BLOOD LEFT UPPER ARM  Final   Special Requests   Final    BOTTLES DRAWN AEROBIC AND ANAEROBIC Blood Culture adequate volume   Culture   Final    NO GROWTH 2 DAYS Performed at [facility], [address] [place], [place] [address]    Report Status PENDING  Incomplete  Resp Panel by RT-PCR (Flu A&B, Covid) Nasopharyngeal Swab     Status: Abnormal   Collection Time: 08/27/21 10:35 PM   Specimen: Nasopharyngeal Swab; Nasopharyngeal(NP) swabs in vial transport medium  Result  Value Ref Range Status   SARS Coronavirus 2 by RT PCR POSITIVE (A) NEGATIVE Final    Comment: RESULT CALLED TO, READ BACK BY AND VERIFIED WITH: K CHRISCO,RN@0025  08/28/21 MK (NOTE) SARS-CoV-2 target nucleic acids are DETECTED.  The SARS-CoV-2 RNA is generally detectable in upper respiratory specimens during the acute phase of infection. Positive results are indicative of the presence of the identified virus, but do not rule out bacterial infection or co-infection with other pathogens not detected by the test. Clinical correlation with patient history and other diagnostic information is necessary to determine patient infection status. The expected result is Negative.  Fact Sheet for Patients: BloggerCourse.com  Fact Sheet for Healthcare Providers: SeriousBroker.it  This test is not yet approved or cleared by the [place] FDA and  has been authorized for detection and/or diagnosis of SARS-CoV-2 by FDA under an Emergency Use Authorization (EUA).  This EUA will remain in effect (meaning this test can be use d) for the duration of  the COVID-19 declaration under Section 564(b)(1) of the Act, 21 U.S.C. section 360bbb-3(b)(1), unless the authorization is terminated or revoked sooner.     Influenza A by PCR NEGATIVE NEGATIVE Final   Influenza B by PCR NEGATIVE NEGATIVE Final    Comment: (NOTE) The Xpert Xpress SARS-CoV-2/FLU/RSV plus assay is intended as an aid in the diagnosis of influenza from Nasopharyngeal swab specimens and should not be used as a sole basis for treatment. Nasal washings and aspirates are unacceptable for Xpert Xpress SARS-CoV-2/FLU/RSV testing.  Fact Sheet for Patients: BloggerCourse.com  Fact Sheet for Healthcare Providers: SeriousBroker.it  This test is not yet approved or cleared by the [place] FDA and has been authorized for detection and/or  diagnosis of SARS-CoV-2 by FDA under an Emergency Use Authorization (EUA). This EUA  will remain in effect (meaning this test can be used) for the duration of the COVID-19 declaration under Section 564(b)(1) of the Act, 21 U.S.C. section 360bbb-3(b)(1), unless the authorization is terminated or revoked.  Performed at [facility], [address], [place], [place] [address]   MRSA Next Gen by PCR, Nasal     Status: None   Collection Time: 08/28/21  2:58 AM   Specimen: Nasal Mucosa; Nasal Swab  Result Value Ref Range Status   MRSA by PCR Next Gen NOT DETECTED NOT DETECTED Final    Comment: (NOTE) The GeneXpert MRSA Assay (FDA approved for NASAL specimens only), is one component of a comprehensive MRSA colonization surveillance program. It is not intended to diagnose MRSA infection nor to guide or monitor treatment for MRSA infections. Test performance is not FDA approved in patients less than 21 years old. Performed at [facility], [address] [place], [place] [address]      Studies: DG Chest Portable 1 View  Result Date: 08/27/2021 CLINICAL DATA:  Shortness of breath.  COVID positive. EXAM: PORTABLE CHEST 1 VIEW COMPARISON:  Most recent radiograph 10/13/2020 FINDINGS: Lung volumes are low. Upper normal heart size. There is vascular congestion. Mild patchy bibasilar airspace disease, left greater than right. No pneumothorax or pleural effusion. Spinal stimulator in place. No acute osseous abnormalities are seen. IMPRESSION: Low lung volumes with upper normal heart size and vascular congestion. Patchy bibasilar airspace disease, left greater than right, atelectasis versus pneumonia in the setting of COVID. Electronically Signed   By: Narda Rutherford M.D.   On: 08/27/2021 22:21      Joycelyn Das, MD  [facility] 08/29/2021  If 7PM-7AM, please contact night-coverage

## 2021-08-30 DIAGNOSIS — J9611 Chronic respiratory failure with hypoxia: Secondary | ICD-10-CM

## 2021-08-30 DIAGNOSIS — J1282 Pneumonia due to coronavirus disease 2019: Secondary | ICD-10-CM

## 2021-08-30 DIAGNOSIS — E875 Hyperkalemia: Secondary | ICD-10-CM

## 2021-08-30 LAB — COMPREHENSIVE METABOLIC PANEL
ALT: 14 U/L (ref 0–44)
AST: 10 U/L — ABNORMAL LOW (ref 15–41)
Albumin: 2.9 g/dL — ABNORMAL LOW (ref 3.5–5.0)
Alkaline Phosphatase: 85 U/L (ref 38–126)
Anion gap: 5 (ref 5–15)
BUN: 37 mg/dL — ABNORMAL HIGH (ref 8–23)
CO2: 29 mmol/L (ref 22–32)
Calcium: 9.3 mg/dL (ref 8.9–10.3)
Chloride: 101 mmol/L (ref 98–111)
Creatinine, Ser: 1.25 mg/dL — ABNORMAL HIGH (ref 0.44–1.00)
GFR, Estimated: 48 mL/min — ABNORMAL LOW (ref >60)
Glucose, Bld: 188 mg/dL — ABNORMAL HIGH (ref 70–99)
Potassium: 5.8 mmol/L — ABNORMAL HIGH (ref 3.5–5.1)
Sodium: 135 mmol/L (ref 135–145)
Total Bilirubin: 0.3 mg/dL (ref 0.3–1.2)
Total Protein: 6.6 g/dL (ref 6.5–8.1)

## 2021-08-30 LAB — CBC WITH DIFFERENTIAL/PLATELET
Abs Immature Granulocytes: 0.13 10*3/uL — ABNORMAL HIGH (ref 0.00–0.07)
Basophils Absolute: 0 10*3/uL (ref 0.0–0.1)
Basophils Relative: 0 %
Eosinophils Absolute: 0 10*3/uL (ref 0.0–0.5)
Eosinophils Relative: 0 %
HCT: 34.7 % — ABNORMAL LOW (ref 36.0–46.0)
Hemoglobin: 11 g/dL — ABNORMAL LOW (ref 12.0–15.0)
Immature Granulocytes: 2 %
Lymphocytes Relative: 15 %
Lymphs Abs: 1 10*3/uL (ref 0.7–4.0)
MCH: 29.2 pg (ref 26.0–34.0)
MCHC: 31.7 g/dL (ref 30.0–36.0)
MCV: 92 fL (ref 80.0–100.0)
Monocytes Absolute: 0.4 10*3/uL (ref 0.1–1.0)
Monocytes Relative: 5 %
Neutro Abs: 5.4 10*3/uL (ref 1.7–7.7)
Neutrophils Relative %: 78 %
Platelets: 185 10*3/uL (ref 150–400)
RBC: 3.77 MIL/uL — ABNORMAL LOW (ref 3.87–5.11)
RDW: 13.3 % (ref 11.5–15.5)
WBC: 7 10*3/uL (ref 4.0–10.5)
nRBC: 0 % (ref 0.0–0.2)

## 2021-08-30 LAB — GLUCOSE, CAPILLARY
Glucose-Capillary: 191 mg/dL — ABNORMAL HIGH (ref 70–99)
Glucose-Capillary: 198 mg/dL — ABNORMAL HIGH (ref 70–99)
Glucose-Capillary: 222 mg/dL — ABNORMAL HIGH (ref 70–99)
Glucose-Capillary: 246 mg/dL — ABNORMAL HIGH (ref 70–99)
Glucose-Capillary: 283 mg/dL — ABNORMAL HIGH (ref 70–99)
Glucose-Capillary: 320 mg/dL — ABNORMAL HIGH (ref 70–99)

## 2021-08-30 LAB — PHOSPHORUS: Phosphorus: 3 mg/dL (ref 2.5–4.6)

## 2021-08-30 LAB — MAGNESIUM: Magnesium: 2 mg/dL (ref 1.7–2.4)

## 2021-08-30 MED ORDER — CARVEDILOL 3.125 MG PO TABS
3.1250 mg | ORAL_TABLET | Freq: Two times a day (BID) | ORAL | Status: DC
  Administered 2021-08-30 – 2021-08-31 (×2): 3.125 mg via ORAL
  Filled 2021-08-30 (×2): qty 1

## 2021-08-30 MED ORDER — INSULIN ASPART 100 UNIT/ML IJ SOLN
2.0000 [IU] | Freq: Three times a day (TID) | INTRAMUSCULAR | Status: DC
  Administered 2021-08-30 – 2021-08-31 (×3): 2 [IU] via SUBCUTANEOUS

## 2021-08-30 MED ORDER — INSULIN ASPART 100 UNIT/ML IJ SOLN
0.0000 [IU] | Freq: Three times a day (TID) | INTRAMUSCULAR | Status: DC
  Administered 2021-08-30: 11 [IU] via SUBCUTANEOUS
  Administered 2021-08-30: 5 [IU] via SUBCUTANEOUS
  Administered 2021-08-30: 8 [IU] via SUBCUTANEOUS
  Administered 2021-08-31: 2 [IU] via SUBCUTANEOUS

## 2021-08-30 MED ORDER — INSULIN GLARGINE-YFGN 100 UNIT/ML ~~LOC~~ SOLN
10.0000 [IU] | Freq: Every day | SUBCUTANEOUS | Status: DC
  Administered 2021-08-30 – 2021-08-31 (×2): 10 [IU] via SUBCUTANEOUS
  Filled 2021-08-30 (×2): qty 0.1

## 2021-08-30 MED ORDER — SODIUM ZIRCONIUM CYCLOSILICATE 10 G PO PACK
10.0000 g | PACK | Freq: Once | ORAL | Status: AC
  Administered 2021-08-30: 10 g via ORAL
  Filled 2021-08-30: qty 1

## 2021-08-30 NOTE — Progress Notes (Signed)
Inpatient Diabetes Program Recommendations  AACE/ADA: New Consensus Statement on Inpatient Glycemic Control (2015)  Target Ranges:  Prepandial:   less than 140 mg/dL      Peak postprandial:   less than 180 mg/dL (1-2 hours)      Critically ill patients:  140 - 180 mg/dL   Lab Results  Component Value Date   GLUCAP 246 (H) 08/30/2021   HGBA1C 5.9 (H) 08/28/2021    Review of Glycemic Control Results for Brenda Morgan, Brenda Morgan (MRN [id number]) as of 08/30/2021 10:17  Ref. Range 08/29/2021 08:09 08/29/2021 11:24 08/29/2021 16:00 08/29/2021 20:10 08/30/2021 00:19 08/30/2021 04:18 08/30/2021 08:07  Glucose-Capillary Latest Ref Range: 70 - 99 mg/dL 915 (H) 056 (H) 979 (H) 285 (H) 198 (H) 191 (H) 246 (H)   Diabetes history: DM 2 Outpatient Diabetes medications:  Actos 15 mg daily, Synjardy 25-1000 mg daily, Ozempic 1 mg weekly Current orders for Inpatient glycemic control:  Decadron 6 mg daily Novolog moderate tid with meals Novolog 2 units tid with meals Semglee 10 units daily Actos 15 mg daily  Inpatient Diabetes Program Recommendations:    Agree with current orders while patient is on steroids.  Will follow.   Thanks,  Beryl Meager, RN, BC-ADM Inpatient Diabetes Coordinator Pager [phone]  (8a-5p)

## 2021-08-30 NOTE — Progress Notes (Signed)
Foley emptied this morning with 2300 cc. By this nurse

## 2021-08-30 NOTE — Progress Notes (Signed)
PROGRESS NOTE  Brenda Morgan FWY:[id number] DOB: Mar 15, 1956 DOA: 08/27/2021 PCP: Medicine, [facility]   LOS: 2 days   Brief narrative:  Brenda Morgan ,is a 65 y.o. 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  Patient was diagnosed to be having COVID-19 on home test on 8/31 after developing flulike symptoms.  She was prescribed paxlovid by her pcp. On 08/27/21 she deveolped more shortness of breath, wheezing, fatigue, and had altered mental status.  She was brought to the ED by her family.  ED course was notable for COVID+ PCR, ABG 7.31/59/53/30, she was started on BiPAP, given 6mg  of decadron. She developed hypotension and was given 1L IVF.  Patient remained confused, so critical care was consulted and patient was admitted to the ICU initially.Patient was gradually weaned off BiPAP and was transferred back to the floor.  Assessment/Plan:  Active Problems:   COVID-19  COVID-pneumonia with acute on chronic hypoxic respiratory failure.   Was initially on BiPAP.  States that she has nocturnal oxygen and BiPAP at home.  Has been weaned to nasal cannula at this time.  Continue Decadron day 3 of 10, remdesivir day 3 of 5.  Received Tocilizumab on 9/5. Continue Dulera.  Blood cultures are negative in 3 days.  On 3 L of oxygen by nasal cannula currently, patient states that she does not take oxygen at home.  Will need to wean oxygen as able. Trend markers in AM..   Acute Metabolic Encephalopathy Likely secondary to hypercapnic respiratory failure.  Improved at this time.     AKI:  Likely secondary to septic shock and urinary retention..  Received IV fluid resuscitation.  Creatinine of 1.2 today.  Has been on Foley catheter.  Will discontinue Foley catheter/voiding trial.  Hyperkalemia.  Potassium of 5.8.  Will give 1 dose of  Lokelma today.  Check potassium levels in a.m.  Urinary Rentention: Continue bladder scans.  Continue to monitor closely.  We will discontinue Foley catheter today.   Mild Thrombocytopenia:  Secondary to  COVID-19 infection.  History of NASH, improved at this time.  Type 2 Diabetes Mellitus with hyperglycemia.:  Likely secondary to steroids.  On Pioglitazone, Ozempic, and Synjardy at home.  Continue sliding scale insulin, Accu-Cheks, diabetic diet.  POC glucose of 246.   We will add long-acting and prandial insulin.   Essential HTN:  On Spironolactone 50 mg daily, Losartan 25 mg.  Has been started on losartan.  Add Coreg low-dose for now..    HFpEF (2018, EF 60-65%) Continue to monitor closely.  Continue to monitor closely..  Chronic venous stasis  Metolazone on hold.  Lasix every other day.  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   Chronic Pain Syndrome: On chronic Percocet 10-325 mg daily PRN and Fentanyl 50 mcg/hr patch.  Continue  Weakness.  We will get PT evaluation.  DVT prophylaxis: heparin injection 5,000 Units Start: 08/28/21 0115 SCDs Start: 08/28/21 0111   Code Status: Full code  Family Communication:  I spoke with the patient's husband on the phone and updated him about the clinical condition of the patient.   Status is: Inpatient  Remains inpatient appropriate because: IV treatments appropriate due to intensity of illness or inability to take PO, and Inpatient level of care appropriate due to severity of illness  Dispo: The patient is from: Home  Anticipated d/c is to: Home likely in 1-2 days, on treatment for COVID.              Patient currently is not medically stable to d/c.   Difficult to place patient No  Consultants: PCCM  Procedures: Bipap  Anti-infectives:  Remdesivir Doxycycline  Anti-infectives (From admission, onward)    Start     Dose/Rate Route Frequency Ordered Stop   08/29/21 1000   remdesivir 100 mg in sodium chloride 0.9 % 100 mL IVPB       See Hyperspace for full Linked Orders Report.   100 mg 200 mL/hr over 30 Minutes Intravenous Daily 08/28/21 0112 09/02/21 0959   08/28/21 2200  doxycycline (VIBRA-TABS) tablet 100 mg        100 mg Oral 2 times daily 08/28/21 1248     08/28/21 0200  remdesivir 200 mg in sodium chloride 0.9% 250 mL IVPB       See Hyperspace for full Linked Orders Report.   200 mg 580 mL/hr over 30 Minutes Intravenous Once 08/28/21 0112 08/28/21 0305      Subjective: Today, patient was seen and examined at bedside. Feels better.  Feels little anxious and wishes to go home.  Denies any pain, nausea, vomiting, fever or chills.  Objective: Vitals:   08/29/21 1400 08/29/21 2113  BP: 136/60 (!) 134/54  Pulse: 64 (!) 59  Resp: 15 18  Temp:  97.8 F (36.6 C)  SpO2: 98% 98%    Intake/Output Summary (Last 24 hours) at 08/30/2021 1010 Last data filed at 08/29/2021 1605 Gross per 24 hour  Intake 1012.9 ml  Output 700 ml  Net 312.9 ml    Filed Weights   08/28/21 0100 08/28/21 0258  Weight: 86 kg 89.5 kg   Body mass index is 33.87 kg/m.   Physical Exam: GENERAL: Patient is alert awake and oriented. Not in obvious distress.  Obese, on nasal cannula oxygen HENT: No scleral pallor or icterus. Pupils equally reactive to light. Oral mucosa is moist NECK: is supple, no gross swelling noted. CHEST:   Diminished breath sounds bilaterally.  No rhonchi or wheezes noted. CVS: S1 and S2 heard, no murmur. Regular rate and rhythm.  ABDOMEN: Soft, non-tender, bowel sounds are present. EXTREMITIES: No edema. CNS: Cranial nerves are intact. No focal motor deficits. SKIN: warm and dry without rashes.  Data Review: I have personally reviewed the following laboratory data and studies,  CBC: Recent Labs  Lab 08/27/21 2216 08/27/21 2235 08/28/21 0351 08/29/21 0221 08/30/21 0211  WBC 12.5*  --  10.3 6.0 7.0  NEUTROABS 9.7*  --   --  4.4 5.4  HGB 11.7*  10.9* 10.3* 10.7* 11.0*  HCT 37.7 32.0* 33.7* 33.8* 34.7*  MCV 95.0  --  94.7 92.3 92.0  PLT 129*  --  122* 146* 185    Basic Metabolic Panel: Recent Labs  Lab 08/27/21 2216 08/27/21 2235 08/28/21 0351 08/28/21 1522 08/29/21 0221 08/30/21 0211  NA 138 138  --  136 137 135  K 4.5 4.5  --  4.6 4.7 5.8*  CL 102  --   --  100 103 101  CO2 28  --   --  27 28 29   GLUCOSE 155*  --   --  149* 165* 188*  BUN 21  --   --  29* 38* 37*  CREATININE 1.54*  --  1.75* 1.65* 1.62* 1.25*  CALCIUM 8.8*  --   --  8.6* 8.8* 9.3  MG  --   --   --   --  1.9 2.0  PHOS  --   --   --   --   --  3.0    Liver Function Tests: Recent Labs  Lab 08/27/21 2216 08/29/21 0221 08/30/21 0211  AST 20 13* 10*  ALT 19 17 14   ALKPHOS 104 92 85  BILITOT 0.8 0.4 0.3  PROT 7.0 6.4* 6.6  ALBUMIN 3.2* 2.9* 2.9*    No results for input(s): LIPASE, AMYLASE in the last 168 hours. Recent Labs  Lab 08/27/21 2220  AMMONIA 11    Cardiac Enzymes: No results for input(s): CKTOTAL, CKMB, CKMBINDEX, TROPONINI in the last 168 hours. BNP (last 3 results) Recent Labs    08/27/21 2218  BNP 302.3*     ProBNP (last 3 results) No results for input(s): PROBNP in the last 8760 hours.  CBG: Recent Labs  Lab 08/29/21 1600 08/29/21 2010 08/30/21 0019 08/30/21 0418 08/30/21 0807  GLUCAP 228* 285* 198* 191* 246*    Recent Results (from the past 240 hour(s))  Culture, blood (routine x 2)     Status: None (Preliminary result)   Collection Time: 08/27/21 10:18 PM   Specimen: BLOOD RIGHT FOREARM  Result Value Ref Range Status   Specimen Description BLOOD RIGHT FOREARM  Final   Special Requests   Final    BOTTLES DRAWN AEROBIC AND ANAEROBIC Blood Culture results may not be optimal due to an inadequate volume of blood received in culture bottles   Culture   Final    NO GROWTH 3 DAYS Performed at [facility], [address] [place], [place] [address]    Report Status PENDING  Incomplete  Culture,  blood (routine x 2)     Status: None (Preliminary result)   Collection Time: 08/27/21 10:35 PM   Specimen: BLOOD  Result Value Ref Range Status   Specimen Description BLOOD LEFT UPPER ARM  Final   Special Requests   Final    BOTTLES DRAWN AEROBIC AND ANAEROBIC Blood Culture adequate volume   Culture   Final    NO GROWTH 3 DAYS Performed at [facility], [address] [place], [place] [address]    Report Status PENDING  Incomplete  Resp Panel by RT-PCR (Flu A&B, Covid) Nasopharyngeal Swab     Status: Abnormal   Collection Time: 08/27/21 10:35 PM   Specimen: Nasopharyngeal Swab; Nasopharyngeal(NP) swabs in vial transport medium  Result Value Ref Range Status   SARS Coronavirus 2 by RT PCR POSITIVE (A) NEGATIVE Final    Comment: RESULT CALLED TO, READ BACK BY AND VERIFIED WITH: K CHRISCO,RN@0025  08/28/21 MK (NOTE) SARS-CoV-2 target nucleic acids are DETECTED.  The SARS-CoV-2 RNA is generally detectable in upper respiratory specimens during the acute phase of infection. Positive results are indicative of the presence of the identified virus, but do not rule out bacterial infection or co-infection with other pathogens not detected by the test. Clinical correlation with patient history and other diagnostic information is necessary to determine patient infection status. The expected result is Negative.  Fact Sheet for Patients: BloggerCourse.comhttps://www.fda.gov/media/152166/download  Fact Sheet for Healthcare Providers: SeriousBroker.ithttps://www.fda.gov/media/152162/download  This test is not yet approved or cleared by the [place] FDA and  has been authorized for detection and/or diagnosis of SARS-CoV-2 by FDA under an Emergency Use Authorization (EUA).  This EUA will remain in effect (meaning this test can be use d) for the duration of  the COVID-19 declaration under Section 564(b)(1) of the Act, 21 U.S.C. section 360bbb-3(b)(1), unless the authorization is  terminated or revoked sooner.      Influenza A by PCR NEGATIVE NEGATIVE Final   Influenza B by PCR NEGATIVE NEGATIVE Final    Comment: (NOTE) The Xpert Xpress SARS-CoV-2/FLU/RSV plus assay is intended as an aid in the diagnosis of influenza from Nasopharyngeal swab specimens and should not be used as a sole basis for treatment. Nasal washings and aspirates are unacceptable for Xpert Xpress SARS-CoV-2/FLU/RSV testing.  Fact Sheet for Patients: BloggerCourse.com  Fact Sheet for Healthcare Providers: SeriousBroker.it  This test is not yet approved or cleared by the [place] FDA and has been authorized for detection and/or diagnosis of SARS-CoV-2 by FDA under an Emergency Use Authorization (EUA). This EUA will remain in effect (meaning this test can be used) for the duration of the COVID-19 declaration under Section 564(b)(1) of the Act, 21 U.S.C. section 360bbb-3(b)(1), unless the authorization is terminated or revoked.  Performed at [facility], [address] [place], [place] [address]   MRSA Next Gen by PCR, Nasal     Status: None   Collection Time: 08/28/21  2:58 AM   Specimen: Nasal Mucosa; Nasal Swab  Result Value Ref Range Status   MRSA by PCR Next Gen NOT DETECTED NOT DETECTED Final    Comment: (NOTE) The GeneXpert MRSA Assay (FDA approved for NASAL specimens only), is one component of a comprehensive MRSA colonization surveillance program. It is not intended to diagnose MRSA infection nor to guide or monitor treatment for MRSA infections. Test performance is not FDA approved in patients less than 104 years old. Performed at [facility], [address] [place], [place] [address]       Studies: No results found.    Joycelyn Das, MD  [facility] 08/30/2021  If 7PM-7AM, please contact night-coverage

## 2021-08-30 NOTE — Evaluation (Signed)
Physical Therapy Evaluation Patient Details Name: Brenda Morgan MRN: [id number] DOB: 02-23-1956 Today's Date: 08/30/2021   History of Present Illness  65 year old female presents with hypoxia and trouble breathing.  History is somewhat from the patient for some later from the husband as the patient is confused.  She has been dealing with COVID-like symptoms since 8/31.  She is had cough, shortness of breath, congestion.  Today however she seems sleepy and having more congestion.  She wears oxygen with her BiPAP at night but not during the day.  She is found to have O2 sats in the 80s.  Patient had a home COVID test that was positive and was started on paxlovid by the PCP.   Clinical Impression  Patient received in bed, pleasant, agrees to PT assessment. She is independent with bed mobility. Transfers with supervision and ambulated 50 feet in room with RW and supervision. No significant deficits with mobility noted. Patient appears to be at baseline level of mobility. No further PT needs at this time.      Follow Up Recommendations No PT follow up    Equipment Recommendations  None recommended by PT    Recommendations for Other Services       Precautions / Restrictions Precautions Precaution Comments: mod fall Restrictions Weight Bearing Restrictions: No      Mobility  Bed Mobility Overal bed mobility: Independent                  Transfers Overall transfer level: Modified independent Equipment used: Rolling walker (2 wheeled)                Ambulation/Gait Ambulation/Gait assistance: Supervision Gait Distance (Feet): 50 Feet Assistive device: Rolling walker (2 wheeled) Gait Pattern/deviations: Step-through pattern Gait velocity: WFL   General Gait Details: no difficulties with mobility. Appears to be at baseline. No significant sob with mobility ambulating on room air  Stairs            Wheelchair Mobility    Modified Rankin (Stroke Patients  Only)       Balance Overall balance assessment: Modified Independent                                           Pertinent Vitals/Pain Pain Assessment: No/denies pain    Home Living Family/patient expects to be discharged to:: Private residence Living Arrangements: Spouse/significant other Available Help at Discharge: Family;Available 24 hours/day Type of Home: Mobile home Home Access: Ramped entrance     Home Layout: One level Home Equipment: Walker - 2 wheels;Shower seat;Grab bars - toilet;Other (comment) (upright walker that she uses daily)      Prior Function Level of Independence: Independent with assistive device(s)         Comments: no recent fall per her report     Hand Dominance   Dominant Hand: Right    Extremity/Trunk Assessment   Upper Extremity Assessment Upper Extremity Assessment: Overall WFL for tasks assessed    Lower Extremity Assessment Lower Extremity Assessment: Overall WFL for tasks assessed    Cervical / Trunk Assessment Cervical / Trunk Assessment: Normal  Communication   Communication: No difficulties  Cognition Arousal/Alertness: Awake/alert Behavior During Therapy: WFL for tasks assessed/performed Overall Cognitive Status: Within Functional Limits for tasks assessed  General Comments      Exercises     Assessment/Plan    PT Assessment Patent does not need any further PT services  PT Problem List Decreased mobility       PT Treatment Interventions      PT Goals (Current goals can be found in the Care Plan section)  Acute Rehab PT Goals Patient Stated Goal: to return home PT Goal Formulation: With patient Time For Goal Achievement: 09/01/21 Potential to Achieve Goals: Good    Frequency     Barriers to discharge        Co-evaluation               AM-PAC PT "6 Clicks" Mobility  Outcome Measure Help needed turning from your back to  your side while in a flat bed without using bedrails?: None Help needed moving from lying on your back to sitting on the side of a flat bed without using bedrails?: None Help needed moving to and from a bed to a chair (including a wheelchair)?: None Help needed standing up from a chair using your arms (e.g., wheelchair or bedside chair)?: None Help needed to walk in hospital room?: None Help needed climbing 3-5 steps with a railing? : A Little 6 Click Score: 23    End of Session Equipment Utilized During Treatment: Oxygen Activity Tolerance: Patient tolerated treatment well Patient left: in bed Nurse Communication: Mobility status      Time: 1045-1101 PT Time Calculation (min) (ACUTE ONLY): 16 min   Charges:   PT Evaluation $PT Eval Low Complexity: 1 Low          Gabriel Paulding, PT, GCS 08/30/21,11:15 AM

## 2021-08-31 DIAGNOSIS — M4626 Osteomyelitis of vertebra, lumbar region: Secondary | ICD-10-CM

## 2021-08-31 DIAGNOSIS — E119 Type 2 diabetes mellitus without complications: Secondary | ICD-10-CM

## 2021-08-31 LAB — COMPREHENSIVE METABOLIC PANEL
ALT: 15 U/L (ref 0–44)
AST: 14 U/L — ABNORMAL LOW (ref 15–41)
Albumin: 3.2 g/dL — ABNORMAL LOW (ref 3.5–5.0)
Alkaline Phosphatase: 78 U/L (ref 38–126)
Anion gap: 8 (ref 5–15)
BUN: 38 mg/dL — ABNORMAL HIGH (ref 8–23)
CO2: 31 mmol/L (ref 22–32)
Calcium: 9.4 mg/dL (ref 8.9–10.3)
Chloride: 98 mmol/L (ref 98–111)
Creatinine, Ser: 1.14 mg/dL — ABNORMAL HIGH (ref 0.44–1.00)
GFR, Estimated: 54 mL/min — ABNORMAL LOW (ref >60)
Glucose, Bld: 124 mg/dL — ABNORMAL HIGH (ref 70–99)
Potassium: 4.8 mmol/L (ref 3.5–5.1)
Sodium: 137 mmol/L (ref 135–145)
Total Bilirubin: 0.5 mg/dL (ref 0.3–1.2)
Total Protein: 7 g/dL (ref 6.5–8.1)

## 2021-08-31 LAB — CBC WITH DIFFERENTIAL/PLATELET
Abs Immature Granulocytes: 0.36 10*3/uL — ABNORMAL HIGH (ref 0.00–0.07)
Basophils Absolute: 0 10*3/uL (ref 0.0–0.1)
Basophils Relative: 1 %
Eosinophils Absolute: 0 10*3/uL (ref 0.0–0.5)
Eosinophils Relative: 0 %
HCT: 38.2 % (ref 36.0–46.0)
Hemoglobin: 12.2 g/dL (ref 12.0–15.0)
Immature Granulocytes: 4 %
Lymphocytes Relative: 16 %
Lymphs Abs: 1.4 10*3/uL (ref 0.7–4.0)
MCH: 28.8 pg (ref 26.0–34.0)
MCHC: 31.9 g/dL (ref 30.0–36.0)
MCV: 90.3 fL (ref 80.0–100.0)
Monocytes Absolute: 0.8 10*3/uL (ref 0.1–1.0)
Monocytes Relative: 9 %
Neutro Abs: 6.3 10*3/uL (ref 1.7–7.7)
Neutrophils Relative %: 70 %
Platelets: 226 10*3/uL (ref 150–400)
RBC: 4.23 MIL/uL (ref 3.87–5.11)
RDW: 13.2 % (ref 11.5–15.5)
WBC: 8.9 10*3/uL (ref 4.0–10.5)
nRBC: 0 % (ref 0.0–0.2)

## 2021-08-31 LAB — MAGNESIUM: Magnesium: 1.9 mg/dL (ref 1.7–2.4)

## 2021-08-31 LAB — GLUCOSE, CAPILLARY
Glucose-Capillary: 79 mg/dL (ref 70–99)
Glucose-Capillary: 133 mg/dL — ABNORMAL HIGH (ref 70–99)

## 2021-08-31 LAB — FERRITIN: Ferritin: 90 ng/mL (ref 11–307)

## 2021-08-31 LAB — D-DIMER, QUANTITATIVE: D-Dimer, Quant: 1.28 ug/mL-FEU — ABNORMAL HIGH (ref 0.00–0.50)

## 2021-08-31 LAB — C-REACTIVE PROTEIN: CRP: 2.3 mg/dL — ABNORMAL HIGH (ref <1.0)

## 2021-08-31 NOTE — Progress Notes (Signed)
O2  85% on room air, 97% with 2 liters of O2

## 2021-08-31 NOTE — Discharge Summary (Addendum)
Physician Discharge Summary  Brenda Morgan QIH:[id number] DOB: 20-Jul-1956 DOA: 08/27/2021  PCP: [facility]  Admit date: 08/27/2021 Discharge date: 08/31/2021  Admitted From: Home  Discharge disposition: Home  Recommendations for Outpatient Follow-Up:   Follow up with your primary care provider in one week.  Check CBC, BMP, magnesium in the next visit  Discharge Diagnosis:   Principal Problem:   Acute respiratory failure with hypoxia and hypercapnia (HCC) Active Problems:   DM II (diabetes mellitus, type II), controlled (HCC)   Infection of lumbar spine (HCC)   AKI (acute kidney injury) (HCC)   Pneumonia due to COVID-19 virus   Acute metabolic encephalopathy   Discharge Condition: Improved.  Diet recommendation: Low sodium, heart healthy.  Carbohydrate-modified.    Wound care: None.  Code status: Full.  History of Present Illness:   Brenda Morgan ,is a 65 y.o. 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  Patient was diagnosed to be having COVID-19 on home test on 8/31 after developing flulike symptoms.  She was prescribed paxlovid by her pcp. On 08/27/21 she deveolped more shortness of breath, wheezing, fatigue, and had altered mental status.  She was brought to the ED by her family.  ED course was notable for COVID+ PCR, ABG 7.31/59/53/30, she was started on BiPAP, given 6mg  of decadron. She developed hypotension and was given 1L IVF.  Patient remained confused, so critical care was consulted and patient was admitted to the ICU initially.Patient was gradually weaned off BiPAP and was transferred back to the floor.  Hospital Course:   Following conditions were addressed during hospitalization as listed below,  COVID pneumonia with acute on chronic hypoxic respiratory failure.   Patient was  initially on BiPAP.  States that she has nocturnal oxygen and BiPAP at home.  Patient has qualified for day time oxygen as well.   Received Tocilizumab on 9/5.  Patient received steroids and remdesivir during hospitalization.  Blood cultures are negative until the time of discharge.  On 3 L of oxygen by nasal cannula currently and will consider 2 L of oxygen on discharge after home desat study.  Ferritin and CRP are significantly improved.  Severe sepsis/septic shock. Ruled in. Presented with septic shock.  Patient was hypotensive and required ICU admission.  She did have COVID pneumonia on presentation.  Initially patient had increased respiratory rate and hypotension, acute kidney injury thrombocytopenia and metabolic encephalopathy and acute respiratory failure with hypoxia and hypercapnia.   Acute Metabolic Encephalopathy Resolved.  Likely secondary to hypercapnic respiratory failure.    Acute kidney injury Likely secondary to septic shock and urinary retention..  Improved.  Latest creatinine of 1.1   hyperkalemia.  Resolved.  Received Lokelma   Urinary Retention: Resolved,   Mild Thrombocytopenia:  Resolved.  Secondary to  COVID-19 infection.  History of NASH   Type 2 Diabetes Mellitus with hyperglycemia.:   Patient had received steroids during hospitalization.  Likely secondary to steroids.  On Pioglitazone, Ozempic, and Synjardy at home.     Essential hypertension. On Spironolactone 50 mg daily, Losartan 25 mg.  Has been started on losartan.  Coreg has been added to the regimen.   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   Chronic Pain Syndrome: On chronic Percocet 10-325 mg daily PRN and Fentanyl 50 mcg/hr patch.  Continue on discharge.   Generalized Weakness.  Physical therapy and did  not recommend any physical therapy on discharge  Disposition.  At this time, patient is stable for disposition home with outpatient PCP follow-up.  Medical Consultants:   PCCM  Procedures:    BiPAP Subjective:   Today, patient was seen and examined at bedside.  Feels better.  Denies any shortness of breath, dyspnea, chest pain, fever or chills.  Continues to feel well and wishes to go home.  Discharge Exam:   Vitals:   08/30/21 2017 08/31/21 0427  BP: 139/64 (!) 135/59  Pulse: 61 (!) 55  Resp: 20 18  Temp: 97.9 F (36.6 C) (!) 97 F (36.1 C)  SpO2: 98% 100%   Vitals:   08/29/21 2113 08/30/21 1220 08/30/21 2017 08/31/21 0427  BP: (!) 134/54 (!) 148/57 139/64 (!) 135/59  Pulse: (!) 59 61 61 (!) 55  Resp: 18 18 20 18   Temp: 97.8 F (36.6 C) 98 F (36.7 C) 97.9 F (36.6 C) (!) 97 F (36.1 C)  TempSrc: Oral Oral Oral Oral  SpO2: 98%  98% 100%  Weight:      Height:        General: Alert awake, not in obvious distress and obese, on nasal cannula oxygen HENT: pupils equally reacting to light,  No scleral pallor or icterus noted. Oral mucosa is moist.  Chest:    Diminished breath sounds bilaterally.   CVS: S1 &S2 heard. No murmur.  Regular rate and rhythm. Abdomen: Soft, nontender, nondistended.  Bowel sounds are heard.   Extremities: No cyanosis, clubbing or edema.  Peripheral pulses are palpable. Psych: Alert, awake and oriented, normal mood CNS:  No cranial nerve deficits.  Power equal in all extremities.   Skin: Warm and dry.  No rashes noted.  The results of significant diagnostics from this hospitalization (including imaging, microbiology, ancillary and laboratory) are listed below for reference.     Diagnostic Studies:   DG Chest Portable 1 View  Result Date: 08/27/2021 CLINICAL DATA:  Shortness of breath.  COVID positive. EXAM: PORTABLE CHEST 1 VIEW COMPARISON:  Most recent radiograph 10/13/2020 FINDINGS: Lung volumes are low. Upper normal heart size. There  is vascular congestion. Mild patchy bibasilar airspace disease, left greater than right. No pneumothorax or pleural effusion. Spinal stimulator in place. No acute osseous abnormalities are seen. IMPRESSION: Low lung volumes with upper normal heart size and vascular congestion. Patchy bibasilar airspace disease, left greater than right, atelectasis versus pneumonia in the setting of COVID. Electronically Signed   By: Narda RutherfordMelanie  Sanford M.D.   On: 08/27/2021 22:21     Labs:   Basic Metabolic Panel: Recent Labs  Lab 08/27/21 2216 08/27/21 2235 08/28/21 0351 08/28/21 1522 08/29/21 0221 08/30/21 0211 08/31/21 0336  NA 138 138  --  136 137 135 137  K 4.5 4.5  --  4.6 4.7 5.8* 4.8  CL 102  --   --  100 103 101 98  CO2 28  --   --  27 28 29 31   GLUCOSE 155*  --   --  149* 165* 188* 124*  BUN 21  --   --  29* 38* 37* 38*  CREATININE 1.54*  --  1.75* 1.65* 1.62* 1.25* 1.14*  CALCIUM 8.8*  --   --  8.6* 8.8* 9.3 9.4  MG  --   --   --   --  1.9 2.0 1.9  PHOS  --   --   --   --   --  3.0  --    GFR Estimated Creatinine Clearance: 54 mL/min (A) (by C-G formula based on SCr of 1.14 mg/dL (H)). Liver Function Tests: Recent Labs  Lab 08/27/21 2216 08/29/21 0221 08/30/21 0211 08/31/21 0336  AST 20 13* 10* 14*  ALT ALKPHOS 104 92 85 78  BILITOT 0.8 0.4 0.3 0.5  PROT 7.0 6.4* 6.6 7.0  ALBUMIN 3.2* 2.9* 2.9* 3.2*   No results for input(s): LIPASE, AMYLASE in the last 168 hours. Recent Labs  Lab 08/27/21 2220  AMMONIA 11   Coagulation profile No results for input(s): INR, PROTIME in the last 168 hours.  CBC: Recent Labs  Lab 08/27/21 2216 08/27/21 2235 08/28/21 0351 08/29/21 0221 08/30/21 0211 08/31/21 0336  WBC 12.5*  --  10.3 6.0 7.0 8.9  NEUTROABS 9.7*  --   --  4.4 5.4 6.3  HGB 11.7* 10.9* 10.3* 10.7* 11.0* 12.2  HCT 37.7 32.0* 33.7* 33.8* 34.7* 38.2  MCV 95.0  --  94.7 92.3 92.0 90.3  PLT 129*  --  122* 146* 185 226   Cardiac Enzymes: No results for  input(s): CKTOTAL, CKMB, CKMBINDEX, TROPONINI in the last 168 hours. BNP: Invalid input(s): POCBNP CBG: Recent Labs  Lab 08/30/21 0807 08/30/21 1217 08/30/21 1620 08/30/21 2020 08/31/21 0742  GLUCAP 246* 222* 283* 320* 79   D-Dimer Recent Labs    08/31/21 0336  DDIMER 1.28*   Hgb A1c No results for input(s): HGBA1C in the last 72 hours. Lipid Profile No results for input(s): CHOL, HDL, LDLCALC, TRIG, CHOLHDL, LDLDIRECT in the last 72 hours. Thyroid function studies No results for input(s): TSH, T4TOTAL, T3FREE, THYROIDAB in the last 72 hours.  Invalid input(s): FREET3 Anemia work up Recent Labs    08/31/21 0336  FERRITIN 90   Microbiology Recent Results (from the past 240 hour(s))  Culture, blood (routine x 2)     Status: None (Preliminary result)   Collection Time: 08/27/21 10:18 PM   Specimen: BLOOD RIGHT FOREARM  Result Value Ref Range Status   Specimen Description BLOOD RIGHT FOREARM  Final   Special Requests   Final    BOTTLES DRAWN AEROBIC AND ANAEROBIC Blood Culture results may not be optimal due to an inadequate volume of blood received in culture bottles   Culture   Final    NO GROWTH 4 DAYS Performed at [facility], [address] [place], [place] [address]    Report Status PENDING  Incomplete  Culture, blood (routine x 2)     Status: None (Preliminary result)   Collection Time: 08/27/21 10:35 PM   Specimen: BLOOD  Result Value Ref Range Status   Specimen Description BLOOD LEFT UPPER ARM  Final   Special Requests   Final    BOTTLES DRAWN AEROBIC AND ANAEROBIC Blood Culture adequate volume   Culture   Final    NO GROWTH 4 DAYS Performed at [facility], [address], [place], [place] [address]    Report Status PENDING  Incomplete  Resp Panel by RT-PCR (Flu A&B, Covid) Nasopharyngeal Swab     Status: Abnormal   Collection Time: 08/27/21 10:35 PM   Specimen: Nasopharyngeal Swab; Nasopharyngeal(NP) swabs in vial transport medium   Result Value Ref Range Status   SARS Coronavirus 2 by RT PCR POSITIVE (A) NEGATIVE Final    Comment: RESULT CALLED  TO, READ BACK BY AND VERIFIED WITH: K CHRISCO,RN@0025  08/28/21 MK (NOTE) SARS-CoV-2 target nucleic acids are DETECTED.  The SARS-CoV-2 RNA is generally detectable in upper respiratory specimens during the acute phase of infection. Positive results are indicative of the presence of the identified virus, but do not rule out bacterial infection or co-infection with other pathogens not detected by the test. Clinical correlation with patient history and other diagnostic information is necessary to determine patient infection status. The expected result is Negative.  Fact Sheet for Patients: BloggerCourse.com  Fact Sheet for Healthcare Providers: SeriousBroker.it  This test is not yet approved or cleared by the [place] FDA and  has been authorized for detection and/or diagnosis of SARS-CoV-2 by FDA under an Emergency Use Authorization (EUA).  This EUA will remain in effect (meaning this test can be use d) for the duration of  the COVID-19 declaration under Section 564(b)(1) of the Act, 21 U.S.C. section 360bbb-3(b)(1), unless the authorization is terminated or revoked sooner.     Influenza A by PCR NEGATIVE NEGATIVE Final   Influenza B by PCR NEGATIVE NEGATIVE Final    Comment: (NOTE) The Xpert Xpress SARS-CoV-2/FLU/RSV plus assay is intended as an aid in the diagnosis of influenza from Nasopharyngeal swab specimens and should not be used as a sole basis for treatment. Nasal washings and aspirates are unacceptable for Xpert Xpress SARS-CoV-2/FLU/RSV testing.  Fact Sheet for Patients: BloggerCourse.com  Fact Sheet for Healthcare Providers: SeriousBroker.it  This test is not yet approved or cleared by the [place] FDA and has been authorized for detection  and/or diagnosis of SARS-CoV-2 by FDA under an Emergency Use Authorization (EUA). This EUA will remain in effect (meaning this test can be used) for the duration of the COVID-19 declaration under Section 564(b)(1) of the Act, 21 U.S.C. section 360bbb-3(b)(1), unless the authorization is terminated or revoked.  Performed at [facility], [address], [place], [place] [address]   MRSA Next Gen by PCR, Nasal     Status: None   Collection Time: 08/28/21  2:58 AM   Specimen: Nasal Mucosa; Nasal Swab  Result Value Ref Range Status   MRSA by PCR Next Gen NOT DETECTED NOT DETECTED Final    Comment: (NOTE) The GeneXpert MRSA Assay (FDA approved for NASAL specimens only), is one component of a comprehensive MRSA colonization surveillance program. It is not intended to diagnose MRSA infection nor to guide or monitor treatment for MRSA infections. Test performance is not FDA approved in patients less than 32 years old. Performed at [facility], [address], [place], [place] [address]      Discharge Instructions:   Discharge Instructions     Diet - low sodium heart healthy   Complete by: As directed    Discharge instructions   Complete by: As directed    Follow-up with your primary care provider in 1 week.  Check blood work at that time.  Please continue to use of BiPAP and oxygen at home. Use oxygen continuously as prescribed.   Increase activity slowly   Complete by: As directed    No wound care   Complete by: As directed       Allergies as of 08/31/2021       Reactions   Amlodipine Anaphylaxis, Swelling, Other (See Comments)   Tongue swelling    Naltrexone Hives   Penicillins Hives, Other (See Comments)   Tolerated ceftriaxone and cefepime 06/2017 Hives   Penicillin G  Medication List     STOP taking these medications    OXYGEN       TAKE these medications    acetaminophen 500 MG tablet Commonly known as: TYLENOL Take 1,000 mg by  mouth every 6 (six) hours as needed for moderate pain, headache or fever.   atorvastatin 10 MG tablet Commonly known as: LIPITOR Take 5 mg by mouth daily.   azelastine 0.1 % nasal spray Commonly known as: ASTELIN Place 2 sprays into both nostrils 2 (two) times daily as needed for rhinitis (congestion).   carvedilol 25 MG tablet Commonly known as: COREG Take 25 mg by mouth daily.   cetirizine 10 MG tablet Commonly known as: ZYRTEC Take 10 mg by mouth daily.   diclofenac Sodium 1 % Gel Commonly known as: VOLTAREN Apply 4 g topically 4 (four) times daily. What changed:  when to take this reasons to take this   doxycycline 100 MG tablet Commonly known as: VIBRA-TABS Take 1 tablet (100 mg total) by mouth 2 (two) times daily.   DULoxetine 30 MG capsule Commonly known as: CYMBALTA Take 30 mg by mouth daily.   famotidine 40 MG tablet Commonly known as: PEPCID Take 40 mg by mouth at bedtime.   fentaNYL 50 MCG/HR Commonly known as: DURAGESIC Place 1 patch onto the skin every 3 (three) days.   ferrous sulfate 325 (65 FE) MG tablet Take 325 mg by mouth daily with breakfast.   Fluticasone-Salmeterol 250-50 MCG/DOSE Aepb Commonly known as: ADVAIR Inhale 1 puff into the lungs 2 (two) times daily as needed (shortness of breath/wheezing).   FreeStyle Libre 14 Day Sensor Misc APPLY 1 SENSOR EVERY 14 DAYS AS DIRECTED   FreeStyle Libre 14 Day Sensor Misc Apply topically every 14 (fourteen) days.   furosemide 40 MG tablet Commonly known as: LASIX Take 40 mg by mouth every other day.   HAIR/SKIN/NAILS PO Take 3 capsules by mouth daily. With Collagen   hydrOXYzine 25 MG tablet Commonly known as: ATARAX/VISTARIL Take 25 mg by mouth at bedtime.   icosapent Ethyl 1 g capsule Commonly known as: VASCEPA Take 2 g by mouth 2 (two) times daily.   lamoTRIgine 150 MG tablet Commonly known as: LAMICTAL Take 150 mg by mouth See admin instructions. Take one tablet (150 mg) by mouth  every night with a 25 mg tablet for a total dose of 175 mg daily   lamoTRIgine 25 MG tablet Commonly known as: LAMICTAL Take 25 mg by mouth See admin instructions. Take one tablet (25 mg) by mouth every night with a 150 mg tablet for a total dose of 175 mg daily   losartan 25 MG tablet Commonly known as: COZAAR Take 25 mg by mouth daily.   magnesium oxide 400 MG tablet Commonly known as: MAG-OX Take 400 mg by mouth daily.   metolazone 5 MG tablet Commonly known as: ZAROXOLYN Take 5 mg by mouth 3 (three) times a week. Sunday,Wednesday,friday   naloxegol oxalate 25 MG Tabs tablet Commonly known as: MOVANTIK Take 25 mg by mouth daily.   oxyCODONE-acetaminophen 10-325 MG tablet Commonly known as: PERCOCET Take 1 tablet by mouth every 8 (eight) hours as needed for pain.   Ozempic (1 MG/DOSE) 4 MG/3ML Sopn Generic drug: Semaglutide (1 MG/DOSE) Inject 1 mg into the skin once a week. Take on Sunday   pantoprazole 40 MG tablet Commonly known as: PROTONIX Take 40 mg by mouth daily.   pioglitazone 15 MG tablet Commonly known as: ACTOS Take 15 mg by  mouth daily.   potassium chloride SA 20 MEQ tablet Commonly known as: KLOR-CON Take 20 mEq by mouth daily.   pramipexole 0.5 MG tablet Commonly known as: MIRAPEX Take 1 mg by mouth at bedtime.   pregabalin 200 MG capsule Commonly known as: LYRICA Take 200 mg by mouth in the morning, at noon, and at bedtime.   promethazine-dextromethorphan 6.25-15 MG/5ML syrup Commonly known as: PROMETHAZINE-DM Take 5 mLs by mouth 4 (four) times daily as needed for cough.   silver sulfADIAZINE 1 % cream Commonly known as: SILVADENE Apply 1 application topically daily as needed (leg sores).   spironolactone 50 MG tablet Commonly known as: ALDACTONE Take 50 mg by mouth daily.   Synjardy XR 25-1000 MG Tb24 Generic drug: Empagliflozin-metFORMIN HCl ER Take 1 tablet by mouth daily.   traZODone 100 MG tablet Commonly known as: DESYREL Take  100 mg by mouth at bedtime.   Vitamin D 50 MCG (2000 UT) tablet Take 2,000 Units by mouth daily.   zinc gluconate 50 MG tablet Take 50 mg by mouth daily.               Durable Medical Equipment  (From admission, onward)           Start     Ordered   08/31/21 1010  For home use only DME oxygen  Once       Question Answer Comment  Length of Need Lifetime   Mode or (Route) Nasal cannula   Liters per Minute 2   Frequency Continuous (stationary and portable oxygen unit needed)   Oxygen conserving device Yes   Oxygen delivery system Gas      08/31/21 1010            Follow-up Information     [facility] Follow up in 1 week(s).   Contact information: [address] [place] [address] [phone]                  Time coordinating discharge: 39 minutes  Signed:  Alysabeth Scalia  [facility] 08/31/2021, 10:13 AM

## 2021-08-31 NOTE — Progress Notes (Addendum)
CSW spoke with pt over the phone for readmission risk items.  Pt from [facility], has home O2 through [facility].  Pt husband will be transporting her home today and he is bring O2 for the ride home.  Confirmed PCP, actual name is [facility], hospital discharge appt made. No other needs identified. Daleen Squibb, MSW, LCSW 9/8/202211:20 AM   1510: Updated home O2 orders faxed to [facility], fax [phone] phone: [phone] Daleen Squibb, MSW, LCSW 9/8/20223:15 PM

## 2021-08-31 NOTE — Plan of Care (Signed)

## 2021-08-31 NOTE — Progress Notes (Signed)
Inpatient Diabetes Program Recommendations  AACE/ADA: New Consensus Statement on Inpatient Glycemic Control (2015)  Target Ranges:  Prepandial:   less than 140 mg/dL      Peak postprandial:   less than 180 mg/dL (1-2 hours)      Critically ill patients:  140 - 180 mg/dL   Lab Results  Component Value Date   GLUCAP 79 08/31/2021   HGBA1C 5.9 (H) 08/28/2021    Review of Glycemic Control Results for NIKITA, SURMAN (MRN [id number]) as of 08/31/2021 08:51  Ref. Range 08/30/2021 04:18 08/30/2021 08:07 08/30/2021 12:17 08/30/2021 16:20 08/30/2021 20:20 08/31/2021 07:42  Glucose-Capillary Latest Ref Range: 70 - 99 mg/dL 449 (H) 675 (H) 916 (H) 283 (H) 320 (H) 79   Diabetes history: DM 2 Outpatient Diabetes medications:  Actos 15 mg daily, Synjardy 25-1000 mg daily, Ozempic 1 mg weekly Current orders for Inpatient glycemic control:  Decadron 6 mg daily Novolog moderate tid with meals and HS  Novolog 2 units tid with meals Semglee 10 units daily Actos 15 mg daily Inpatient Diabetes Program Recommendations:    Note patient received 11 units of Novolog last PM for CBG of 320 mg/dL.   Please change to HS scale (instead of moderate) at bedtime.   Also consider increasing Novolog meal coverage to 4 units tid with meals.   Thanks,  Beryl Meager, RN, BC-ADM Inpatient Diabetes Coordinator Pager [phone]  (8a-5p)

## 2021-09-01 ENCOUNTER — Encounter (HOSPITAL_COMMUNITY)
Admission: RE | Admit: 2021-09-01 | Discharge: 2021-09-01 | Disposition: A | Discharge status: Home / Self Care | Payer: BC Managed Care – PPO | Source: Ambulatory Visit

## 2021-09-01 LAB — CULTURE, BLOOD (ROUTINE X 2)
Culture: NO GROWTH
Culture: NO GROWTH
Special Requests: ADEQUATE

## 2021-09-01 NOTE — Progress Notes (Signed)
Called patient regarding surgery cancellation to confirm she is not coming in for labs. Patient verified her surgery is cancelled and will not be coming in today.

## 2021-09-05 ENCOUNTER — Ambulatory Visit: Admit: 2021-09-05 | Payer: Medicare PPO | Admitting: Orthopedic Surgery

## 2021-09-05 SURGERY — ARTHROPLASTY, KNEE, TOTAL
Anesthesia: Choice | Site: Knee | Laterality: Left

## 2021-09-27 ENCOUNTER — Encounter: Payer: Self-pay | Admitting: Neurology

## 2021-09-27 ENCOUNTER — Ambulatory Visit (INDEPENDENT_AMBULATORY_CARE_PROVIDER_SITE_OTHER): Payer: BC Managed Care – PPO | Admitting: Neurology

## 2021-09-27 ENCOUNTER — Other Ambulatory Visit: Payer: Self-pay

## 2021-09-27 VITALS — BP 135/69 | HR 79 | Ht 66.0 in | Wt 195.0 lb

## 2021-09-27 DIAGNOSIS — G40909 Epilepsy, unspecified, not intractable, without status epilepticus: Secondary | ICD-10-CM

## 2021-09-27 MED ORDER — LEVETIRACETAM 500 MG PO TABS: 500.0000 mg | ORAL_TABLET | Freq: Two times a day (BID) | ORAL | 12 refills | Status: DC

## 2021-09-27 NOTE — Patient Instructions (Signed)
Continue with Levetiracetam 500 mg BID  Check Levetiracetam level today  Ambulatory EEG 24 hrs  MRI Brain without contrast (Kidney disease)     Per [facility] statutes, patients with seizures are not allowed to drive until they have been seizure-free for six months.  Other recommendations include using caution when using heavy equipment or power tools. Avoid working on ladders or at heights. Take showers instead of baths.  Do not swim alone.  Ensure the water temperature is not too high on the home water heater. Do not go swimming alone. Do not lock yourself in a room alone (i.e. bathroom). When caring for infants or small children, sit down when holding, feeding, or changing them to minimize risk of injury to the child in the event you have a seizure. Maintain good sleep hygiene. Avoid alcohol.  Also recommend adequate sleep, hydration, good diet and minimize stress.   During the Seizure  - First, ensure adequate ventilation and place patients on the floor on their left side  Loosen clothing around the neck and ensure the airway is patent. If the patient is clenching the teeth, do not force the mouth open with any object as this can cause severe damage - Remove all items from the surrounding that can be hazardous. The patient may be oblivious to what's happening and may not even know what he or she is doing. If the patient is confused and wandering, either gently guide him/her away and block access to outside areas - Reassure the individual and be comforting - Call 911. In most cases, the seizure ends before EMS arrives. However, there are cases when seizures may last over 3 to 5 minutes. Or the individual may have developed breathing difficulties or severe injuries. If a pregnant patient or a person with diabetes develops a seizure, it is prudent to call an ambulance. - Finally, if the patient does not regain full consciousness, then call EMS. Most patients will remain confused for about  45 to 90 minutes after a seizure, so you must use judgment in calling for help. - Avoid restraints but make sure the patient is in a bed with padded side rails - Place the individual in a lateral position with the neck slightly flexed; this will help the saliva drain from the mouth and prevent the tongue from falling backward - Remove all nearby furniture and other hazards from the area - Provide verbal assurance as the individual is regaining consciousness - Provide the patient with privacy if possible - Call for help and start treatment as ordered by the caregiver   After the Seizure (Postictal Stage)  After a seizure, most patients experience confusion, fatigue, muscle pain and/or a headache. Thus, one should permit the individual to sleep. For the next few days, reassurance is essential. Being calm and helping reorient the person is also of importance.  Most seizures are painless and end spontaneously. Seizures are not harmful to others but can lead to complications such as stress on the lungs, brain and the heart. Individuals with prior lung problems may develop labored breathing and respiratory distress.

## 2021-09-27 NOTE — Progress Notes (Signed)
[facility]  PATIENT: Brenda Morgan DOB: Nov 07, 1956  REFERRING CLINICIAN: Medicine, [place] [facility] HISTORY FROM: Patient and husband  REASON FOR VISIT: New onset Seizure    HISTORICAL  CHIEF COMPLAINT:  Chief Complaint  Patient presents with   Seizures    New patient: paper referral:  new onset seizures in September 2022 Room 12, husband Fayrene Fearing in room    HISTORY OF PRESENT ILLNESS:  This is a 65 year old woman with extended medical history including fibromyalgia, chronic pain, recurrent UTIs, CKD, chronic liver cirrhosis, hyperlipidemia, hypertension, CHF, obstructive sleep apnea on BiPAP, GERD, prediabetes who is presenting with seizure-like activity 2 weeks ago.  Per husband patient was laying on the bed then he had a growling noise, he went to check on her and she was jerking her head back-and-forth, eyes rolled back, not breathing and foam coming out of the mouth.  Husband said that he put his fingers in patient mouth to put back her tongue.  He called EMS when EMS arrived patient had a second event very similar with head-bobbing, jerking involving the head and arms, episode lasting more than 2 minutes.  He was taken to the local hospital where she had a head CT which was negative and a routine EEG who was reported to be normal. She was also found to be have a lactic acid up to 5.1.She was started on 500 mg twice daily Keppra.  She was in the hospital for a total of 3 days. Husband states that she was confused in the first 2 days of admission.  Since being discharged from the hospital, patient does not have any seizure-like activity.  She denies any previous history of seizures, denies any family history of seizures, denies any history of head trauma and denies overall any seizure risk factor.  She is on lamotrigine for mood.    Handedness: Right   Seizure Type: Generalized   Current frequency: First time seizures  Any injuries from seizures: None    Seizure risk factors: None reported, no strokes,no head trauma   Previous ASMs: None   Currenty ASMs: Levetiracetam 500 mg BID   ASMs side effects: None   Brain Images: CT head normal   Previous EEGs: No abnormalities    OTHER MEDICAL CONDITIONS: Fibromyalgia, chronic pain, recurrent UTIs, CKD, chronic liver cirrhosis, hyperlipidemia, hypertension, CHF, obstructive sleep apnea on BiPAP, GERD, prediabetes  REVIEW OF SYSTEMS: Full 14 system review of systems performed and negative with exception of: as noted in the HPI.   ALLERGIES: Allergies  Allergen Reactions   Amlodipine Anaphylaxis, Swelling and Other (See Comments)    Tongue swelling    Naltrexone Hives   Penicillins Hives and Other (See Comments)    Tolerated ceftriaxone and cefepime 06/2017 Hives     Penicillin G     HOME MEDICATIONS: Outpatient Medications Prior to Visit  Medication Sig Dispense Refill   acetaminophen (TYLENOL) 500 MG tablet Take 1,000 mg by mouth every 6 (six) hours as needed for moderate pain, headache or fever.     atorvastatin (LIPITOR) 10 MG tablet Take 5 mg by mouth daily.     azelastine (ASTELIN) 0.1 % nasal spray Place 2 sprays into both nostrils 2 (two) times daily as needed for rhinitis (congestion).      Biotin w/ Vitamins C & E (HAIR/SKIN/NAILS PO) Take 3 capsules by mouth daily. With Collagen     carvedilol (COREG) 25 MG tablet Take 25 mg by mouth daily.     cetirizine (ZYRTEC)  10 MG tablet Take 10 mg by mouth daily.     Cholecalciferol (VITAMIN D) 50 MCG (2000 UT) tablet Take 2,000 Units by mouth daily.     Continuous Blood Gluc Sensor (FREESTYLE LIBRE 14 DAY SENSOR) MISC Apply topically every 14 (fourteen) days.     Continuous Blood Gluc Sensor (FREESTYLE LIBRE 14 DAY SENSOR) MISC APPLY 1 SENSOR EVERY 14 DAYS AS DIRECTED     diclofenac Sodium (VOLTAREN) 1 % GEL Apply 4 g topically 4 (four) times daily. 100 g 0   doxycycline (VIBRA-TABS) 100 MG tablet Take 1 tablet (100 mg total) by  mouth 2 (two) times daily. 60 tablet 11   DULoxetine (CYMBALTA) 30 MG capsule Take 30 mg by mouth daily.     Empagliflozin-metFORMIN HCl ER (SYNJARDY XR) 25-1000 MG TB24 Take 1 tablet by mouth daily.     famotidine (PEPCID) 40 MG tablet Take 40 mg by mouth at bedtime.     fentaNYL (DURAGESIC) 50 MCG/HR Place 1 patch onto the skin every 3 (three) days.     ferrous sulfate 325 (65 FE) MG tablet Take 325 mg by mouth daily with breakfast.     Fluticasone-Salmeterol (ADVAIR) 250-50 MCG/DOSE AEPB Inhale 1 puff into the lungs 2 (two) times daily as needed (shortness of breath/wheezing).      furosemide (LASIX) 40 MG tablet Take 40 mg by mouth every other day.     HYDROmorphone (DILAUDID) 4 MG tablet Take by mouth every 4 (four) hours as needed for severe pain.     hydrOXYzine (ATARAX/VISTARIL) 25 MG tablet Take 25 mg by mouth at bedtime.     icosapent Ethyl (VASCEPA) 1 g capsule Take 2 g by mouth 2 (two) times daily.     lamoTRIgine (LAMICTAL) 150 MG tablet Take 150 mg by mouth See admin instructions. Take one tablet (150 mg) by mouth every night with a 25 mg tablet for a total dose of 175 mg daily     lamoTRIgine (LAMICTAL) 25 MG tablet Take 25 mg by mouth See admin instructions. Take one tablet (25 mg) by mouth every night with a 150 mg tablet for a total dose of 175 mg daily     losartan (COZAAR) 25 MG tablet Take 25 mg by mouth daily.     magnesium oxide (MAG-OX) 400 MG tablet Take 400 mg by mouth daily.     metolazone (ZAROXOLYN) 5 MG tablet Take 5 mg by mouth 3 (three) times a week. Sunday,Wednesday,friday     naloxegol oxalate (MOVANTIK) 25 MG TABS tablet Take 25 mg by mouth daily.     OZEMPIC, 1 MG/DOSE, 4 MG/3ML SOPN Inject 1 mg into the skin once a week. Take on Sunday     pantoprazole (PROTONIX) 40 MG tablet Take 40 mg by mouth daily.     pioglitazone (ACTOS) 15 MG tablet Take 15 mg by mouth daily.     potassium chloride SA (KLOR-CON) 20 MEQ tablet Take 20 mEq by mouth daily.     pramipexole  (MIRAPEX) 0.5 MG tablet Take 1 mg by mouth at bedtime.     pregabalin (LYRICA) 200 MG capsule Take 200 mg by mouth in the morning, at noon, and at bedtime.     promethazine-dextromethorphan (PROMETHAZINE-DM) 6.25-15 MG/5ML syrup Take 5 mLs by mouth 4 (four) times daily as needed for cough.     silver sulfADIAZINE (SILVADENE) 1 % cream Apply 1 application topically daily as needed (leg sores).      spironolactone (ALDACTONE) 50 MG tablet  Take 50 mg by mouth daily.     traZODone (DESYREL) 100 MG tablet Take 100 mg by mouth at bedtime.     zinc gluconate 50 MG tablet Take 50 mg by mouth daily.     levETIRAcetam (KEPPRA) 500 MG tablet Take 500 mg by mouth 2 (two) times daily.     oxyCODONE-acetaminophen (PERCOCET) 10-325 MG tablet Take 1 tablet by mouth every 8 (eight) hours as needed for pain.     No facility-administered medications prior to visit.    PAST MEDICAL HISTORY: Past Medical History:  Diagnosis Date   Anemia    Anxiety    Arthritis    COPD (chronic obstructive pulmonary disease) (HCC)    Diabetes mellitus without complication (HCC)    Fatty liver 2012   per pt 06/24/20   GERD (gastroesophageal reflux disease)    Heart murmur    Hypertension    Pneumonia    Seizures (HCC)    Sleep apnea     PAST SURGICAL HISTORY: Past Surgical History:  Procedure Laterality Date   BACK SURGERY     carotid artery surgery Right 10/07/2012   carotid ultrasound  06/2015   CHOLECYSTECTOMY  1981   COLONOSCOPY  2014   DIAGNOSTIC MAMMOGRAM  03/2017   groin lypnoid Left 03/2015   LUMBAR WOUND DEBRIDEMENT N/A 08/02/2020   Procedure: IRRIGATE AND DEBRIDEMENT OF LUMBAR WOUND, PLACEMENT OF WOUND VAC;  Surgeon: Dawley, Alan Mulder, DO;  Location: [facility] OR;  Service: Neurosurgery;  Laterality: N/A;   right heel spur  1994   SPINAL CORD STIMULATOR INSERTION N/A 05/03/2021   Procedure: Spinal Cord Stimulator Placement;  Surgeon: Renaldo Fiddler, MD;  Location: [facility] OR;  Service: Neurosurgery;  Laterality: N/A;    TUBAL LIGATION  1985   wrist trigger release Right 01/2016    FAMILY HISTORY: Family History  Problem Relation Age of Onset   Diabetes Mother    Heart disease Mother    Stomach cancer Mother    Diabetes Father    Heart disease Father    Lung cancer Father     SOCIAL HISTORY: Social History   Socioeconomic History   Marital status: Married    Spouse name: Fayrene Fearing   Number of children: Not on file   Years of education: Not on file   Highest education level: Not on file  Occupational History   Not on file  Tobacco Use   Smoking status: Former    Packs/day: 1.00    Types: Cigarettes    Quit date: 04/23/2021    Years since quitting: 0.4   Smokeless tobacco: Never  Vaping Use   Vaping Use: Never used  Substance and Sexual Activity   Alcohol use: Not Currently   Drug use: Not Currently   Sexual activity: Not on file  Other Topics Concern   Not on file  Social History Narrative   Lives with husband   left Handed   Drinks caffeine rarely   Social Determinants of Health   Financial Resource Strain: Not on file  Food Insecurity: Not on file  Transportation Needs: Not on file  Physical Activity: Not on file  Stress: Not on file  Social Connections: Not on file  Intimate Partner Violence: Not on file     PHYSICAL EXAM  GENERAL EXAM/CONSTITUTIONAL: Vitals:  Vitals:   09/27/21 1303  BP: 135/69  Pulse: 79  Weight: 195 lb (88.5 kg)  Height: 5\' 6"  (1.676 m)   Body mass index is 31.47 kg/m. Wt  Readings from Last 3 Encounters:  09/27/21 195 lb (88.5 kg)  08/28/21 197 lb 5 oz (89.5 kg)  05/03/21 190 lb 0.6 oz (86.2 kg)   Patient is in no distress; well developed, nourished and groomed; neck is supple  EYES: Pupils round and reactive to light, Visual fields full to confrontation, Extraocular movements intacts,  No results found.  MUSCULOSKELETAL: Gait, strength, tone, movements noted in Neurologic exam below  NEUROLOGIC: MENTAL STATUS:  No flowsheet  data found. awake, alert, oriented to person, place and time recent and remote memory intact normal attention and concentration language fluent, comprehension intact, naming intact fund of knowledge appropriate  CRANIAL NERVE:  2nd, 3rd, 4th, 6th - pupils equal and reactive to light, visual fields full to confrontation, extraocular muscles intact, no nystagmus 5th - facial sensation symmetric 7th - facial strength symmetric 8th - hearing intact 9th - palate elevates symmetrically, uvula midline 11th - shoulder shrug symmetric 12th - tongue protrusion midline  MOTOR:  normal bulk and tone, full strength in the BUE, BLE  SENSORY:  normal and symmetric to light touch, pinprick, temperature, vibration  COORDINATION:  finger-nose-finger  GAIT/STATION:  Walks with a walker     DIAGNOSTIC DATA (LABS, IMAGING, TESTING) - I reviewed patient records, labs, notes, testing and imaging myself where available.  Lab Results  Component Value Date   WBC 8.9 08/31/2021   HGB 12.2 08/31/2021   HCT 38.2 08/31/2021   MCV 90.3 08/31/2021   PLT 226 08/31/2021      Component Value Date/Time   NA 137 08/31/2021 0336   K 4.8 08/31/2021 0336   CL 98 08/31/2021 0336   CO2 31 08/31/2021 0336   GLUCOSE 124 (H) 08/31/2021 0336   BUN 38 (H) 08/31/2021 0336   CREATININE 1.14 (H) 08/31/2021 0336   CALCIUM 9.4 08/31/2021 0336   PROT 7.0 08/31/2021 0336   ALBUMIN 3.2 (L) 08/31/2021 0336   AST 14 (L) 08/31/2021 0336   ALT 15 08/31/2021 0336   ALKPHOS 78 08/31/2021 0336   BILITOT 0.5 08/31/2021 0336   GFRNONAA 54 (L) 08/31/2021 0336   GFRAA >60 09/23/2020 1328   No results found for: CHOL, HDL, LDLCALC, LDLDIRECT, TRIG Lab Results  Component Value Date   HGBA1C 5.9 (H) 08/28/2021   No results found for: VITAMINB12 Lab Results  Component Value Date   TSH 1.493 10/14/2020    Head CT [facility]): No acute intracranial abnormalities   Routine EEG: Reported normal.    ASSESSMENT AND  PLAN  65 y.o. year old female  with Fibromyalgia, chronic pain, recurrent UTIs, CKD, chronic liver cirrhosis, hyperlipidemia, hypertension, CHF, obstructive sleep apnea on BiPAP, GERD, prediabetes who is presenting with first lifetime seizure.  Seizure described as generalized tonic-clonic, no tongue biting no urinary incontinence.  She is on Keppra 500 mg twice daily and she tolerating the medication well, denies any side effect.  At this point I will continue levetiracetam 500 mg twice daily, I will obtain a level today and I will also obtain a MRI brain and ambulatory EEG.  I will call the patient to go over the results otherwise I will see her in 3 months for follow-up.  Discussed about driving restriction with the new onset seizures and also discussed about seizure first-aid.   1. Seizure disorder (HCC)     PLAN: Continue with Levetiracetam 500 mg BID  Check Levetiracetam level today  Ambulatory EEG 24 hrs  MRI Brain without contrast (Kidney disease)  Follow up in 3  months    Per [facility] statutes, patients with seizures are not allowed to drive until they have been seizure-free for six months.  Other recommendations include using caution when using heavy equipment or power tools. Avoid working on ladders or at heights. Take showers instead of baths.  Do not swim alone.  Ensure the water temperature is not too high on the home water heater. Do not go swimming alone. Do not lock yourself in a room alone (i.e. bathroom). When caring for infants or small children, sit down when holding, feeding, or changing them to minimize risk of injury to the child in the event you have a seizure. Maintain good sleep hygiene. Avoid alcohol.  Also recommend adequate sleep, hydration, good diet and minimize stress.   During the Seizure  - First, ensure adequate ventilation and place patients on the floor on their left side  Loosen clothing around the neck and ensure the airway is patent. If the  patient is clenching the teeth, do not force the mouth open with any object as this can cause severe damage - Remove all items from the surrounding that can be hazardous. The patient may be oblivious to what's happening and may not even know what he or she is doing. If the patient is confused and wandering, either gently guide him/her away and block access to outside areas - Reassure the individual and be comforting - Call 911. In most cases, the seizure ends before EMS arrives. However, there are cases when seizures may last over 3 to 5 minutes. Or the individual may have developed breathing difficulties or severe injuries. If a pregnant patient or a person with diabetes develops a seizure, it is prudent to call an ambulance. - Finally, if the patient does not regain full consciousness, then call EMS. Most patients will remain confused for about 45 to 90 minutes after a seizure, so you must use judgment in calling for help. - Avoid restraints but make sure the patient is in a bed with padded side rails - Place the individual in a lateral position with the neck slightly flexed; this will help the saliva drain from the mouth and prevent the tongue from falling backward - Remove all nearby furniture and other hazards from the area - Provide verbal assurance as the individual is regaining consciousness - Provide the patient with privacy if possible - Call for help and start treatment as ordered by the caregiver   After the Seizure (Postictal Stage)  After a seizure, most patients experience confusion, fatigue, muscle pain and/or a headache. Thus, one should permit the individual to sleep. For the next few days, reassurance is essential. Being calm and helping reorient the person is also of importance.  Most seizures are painless and end spontaneously. Seizures are not harmful to others but can lead to complications such as stress on the lungs, brain and the heart. Individuals with prior lung problems  may develop labored breathing and respiratory distress.     Orders Placed This Encounter  Procedures   MR BRAIN WO CONTRAST   Levetiracetam level   AMBULATORY EEG     Meds ordered this encounter  Medications   levETIRAcetam (KEPPRA) 500 MG tablet    Sig: Take 1 tablet (500 mg total) by mouth 2 (two) times daily.    Dispense:  60 tablet    Refill:  12     Return in about 3 months (around 12/28/2021).    Windell Norfolk, MD 09/27/2021, 2:04 PM  [facility] [address], [place], [place] [address] [phone]

## 2021-09-28 ENCOUNTER — Telehealth: Payer: Self-pay | Admitting: *Deleted

## 2021-09-28 NOTE — Telephone Encounter (Signed)
Request for surgery clearance received received by [facility] (left total knee replacement).  The patient is seen here for seizures. Taking generic Keppra 500mg , one tab BID.   Dr. approved the clearance with the following instructions:   Please take the generic Keppra 500mg , 2 tabs the morning of surgery.   Dr. Teresa Coombs also called the patient's husband.  Form signed by MD and faxed back to [phone].

## 2021-09-29 LAB — LEVETIRACETAM LEVEL: Levetiracetam Lvl: 23.3 ug/mL (ref 10.0–40.0)

## 2021-10-10 ENCOUNTER — Other Ambulatory Visit: Payer: Self-pay | Admitting: Neurology

## 2021-10-10 DIAGNOSIS — G40909 Epilepsy, unspecified, not intractable, without status epilepticus: Secondary | ICD-10-CM

## 2021-10-10 NOTE — Telephone Encounter (Signed)
I sent the information to [facility], they will reach out to the patient to schedule.

## 2021-10-12 NOTE — Telephone Encounter (Signed)
Patient is scheduled at [facility] for 10/18/21.

## 2021-10-18 ENCOUNTER — Other Ambulatory Visit: Payer: Self-pay

## 2021-10-18 ENCOUNTER — Ambulatory Visit (HOSPITAL_COMMUNITY)
Admission: RE | Admit: 2021-10-18 | Discharge: 2021-10-18 | Disposition: A | Discharge status: Home / Self Care | Payer: BC Managed Care – PPO | Source: Ambulatory Visit | Attending: Neurology | Admitting: Neurology

## 2021-10-18 DIAGNOSIS — Z8673 Personal history of transient ischemic attack (TIA), and cerebral infarction without residual deficits: Secondary | ICD-10-CM | POA: Insufficient documentation

## 2021-10-18 DIAGNOSIS — G40909 Epilepsy, unspecified, not intractable, without status epilepticus: Secondary | ICD-10-CM | POA: Insufficient documentation

## 2021-11-06 ENCOUNTER — Other Ambulatory Visit: Payer: Self-pay

## 2021-11-06 ENCOUNTER — Ambulatory Visit (INDEPENDENT_AMBULATORY_CARE_PROVIDER_SITE_OTHER): Payer: BC Managed Care – PPO | Admitting: Pulmonary Disease

## 2021-11-06 ENCOUNTER — Encounter: Payer: Self-pay | Admitting: Pulmonary Disease

## 2021-11-06 VITALS — BP 134/82 | HR 62 | Wt 201.0 lb

## 2021-11-06 DIAGNOSIS — J449 Chronic obstructive pulmonary disease, unspecified: Secondary | ICD-10-CM | POA: Diagnosis not present

## 2021-11-06 DIAGNOSIS — G4733 Obstructive sleep apnea (adult) (pediatric): Secondary | ICD-10-CM | POA: Diagnosis not present

## 2021-11-06 NOTE — Progress Notes (Signed)
Brenda Morgan    [id number]    20-Dec-1956  Primary Care Physician:Medicine, [facility]  Referring Physician: Medicine, [facility] [address] [place],  [place] [address]  Chief complaint:   Patient being seen for obstructive lung disease  HPI:  History of chronic respiratory failure, hypercapnic respiratory failure Past history of COVID infection  Patient was scheduled for knee surgery and pulmonary clearance Stated she saw somebody in [place], decided to seek another opinion  Recently had back surgery about a year ago which she tolerated well-did not have any respiratory complications from that  45-pack-year smoking history, quit in May 2022  She is on multiple inhalers which she is compliant with  Multiple back surgeries, multiple falls over the years, uses a walker for ambulation On level ground without being in a hurry she states she may be able to walk up to a mile  Currently not having any exacerbation with no cough, no shortness of breath at rest At some point she was using oxygen supplementation following the recent hospitalization, has not been using oxygen recently She does monitor her oximetry and she is always been in the 90s without oxygen  Sleep apnea was diagnosed in 2000 Has tried multiple interfaces, not able to get used to it   Outpatient Encounter Medications as of 11/06/2021  Medication Sig   acetaminophen (TYLENOL) 500 MG tablet Take 1,000 mg by mouth every 6 (six) hours as needed for moderate pain, headache or fever.   atorvastatin (LIPITOR) 10 MG tablet Take 5 mg by mouth daily.   azelastine (ASTELIN) 0.1 % nasal spray Place 2 sprays into both nostrils 2 (two) times daily as needed for rhinitis (congestion).    Biotin w/ Vitamins C & E (HAIR/SKIN/NAILS PO) Take 3 capsules by mouth daily. With Collagen   carvedilol (COREG) 25 MG tablet Take 25 mg by mouth daily.   cetirizine (ZYRTEC) 10 MG tablet Take 10 mg by mouth  daily.   Cholecalciferol (VITAMIN D) 50 MCG (2000 UT) tablet Take 2,000 Units by mouth daily.   Continuous Blood Gluc Sensor (FREESTYLE LIBRE 14 DAY SENSOR) MISC Apply topically every 14 (fourteen) days.   Continuous Blood Gluc Sensor (FREESTYLE LIBRE 14 DAY SENSOR) MISC APPLY 1 SENSOR EVERY 14 DAYS AS DIRECTED   diclofenac Sodium (VOLTAREN) 1 % GEL Apply 4 g topically 4 (four) times daily.   doxycycline (VIBRA-TABS) 100 MG tablet Take 1 tablet (100 mg total) by mouth 2 (two) times daily.   DULoxetine (CYMBALTA) 30 MG capsule Take 30 mg by mouth daily.   Empagliflozin-metFORMIN HCl ER (SYNJARDY XR) 25-1000 MG TB24 Take 1 tablet by mouth daily.   famotidine (PEPCID) 40 MG tablet Take 40 mg by mouth at bedtime.   fentaNYL (DURAGESIC) 50 MCG/HR Place 1 patch onto the skin every 3 (three) days.   ferrous sulfate 325 (65 FE) MG tablet Take 325 mg by mouth daily with breakfast.   Fluticasone-Salmeterol (ADVAIR) 250-50 MCG/DOSE AEPB Inhale 1 puff into the lungs 2 (two) times daily as needed (shortness of breath/wheezing).    furosemide (LASIX) 40 MG tablet Take 40 mg by mouth every other day.   HYDROmorphone (DILAUDID) 4 MG tablet Take by mouth every 4 (four) hours as needed for severe pain.   hydrOXYzine (ATARAX/VISTARIL) 25 MG tablet Take 25 mg by mouth at bedtime.   icosapent Ethyl (VASCEPA) 1 g capsule Take 2 g by mouth 2 (two) times daily.   losartan (COZAAR)  25 MG tablet Take 25 mg by mouth daily.   magnesium oxide (MAG-OX) 400 MG tablet Take 400 mg by mouth daily.   metolazone (ZAROXOLYN) 5 MG tablet Take 5 mg by mouth 3 (three) times a week. Sunday,Wednesday,friday   naloxegol oxalate (MOVANTIK) 25 MG TABS tablet Take 25 mg by mouth daily.   OZEMPIC, 1 MG/DOSE, 4 MG/3ML SOPN Inject 1 mg into the skin once a week. Take on Sunday   pantoprazole (PROTONIX) 40 MG tablet Take 40 mg by mouth daily.   pioglitazone (ACTOS) 15 MG tablet Take 15 mg by mouth daily.   potassium chloride SA (KLOR-CON) 20  MEQ tablet Take 20 mEq by mouth daily.   pramipexole (MIRAPEX) 0.5 MG tablet Take 1 mg by mouth at bedtime.   pregabalin (LYRICA) 200 MG capsule Take 200 mg by mouth in the morning, at noon, and at bedtime.   promethazine-dextromethorphan (PROMETHAZINE-DM) 6.25-15 MG/5ML syrup Take 5 mLs by mouth 4 (four) times daily as needed for cough.   silver sulfADIAZINE (SILVADENE) 1 % cream Apply 1 application topically daily as needed (leg sores).    spironolactone (ALDACTONE) 50 MG tablet Take 50 mg by mouth daily.   traZODone (DESYREL) 100 MG tablet Take 100 mg by mouth at bedtime.   zinc gluconate 50 MG tablet Take 50 mg by mouth daily.   levETIRAcetam (KEPPRA) 500 MG tablet Take 1 tablet (500 mg total) by mouth 2 (two) times daily.   No facility-administered encounter medications on file as of 11/06/2021.    Allergies as of 11/06/2021 - Review Complete 09/27/2021  Allergen Reaction Noted   Amlodipine Anaphylaxis, Swelling, and Other (See Comments) 08/18/2013   Naltrexone Hives 11/01/2017   Penicillins Hives and Other (See Comments) 08/18/2013   Penicillin g  02/27/2021    Past Medical History:  Diagnosis Date   Anemia    Anxiety    Arthritis    COPD (chronic obstructive pulmonary disease) (HCC)    Diabetes mellitus without complication (HCC)    Fatty liver 2012   per pt 06/24/20   GERD (gastroesophageal reflux disease)    Heart murmur    Hypertension    Pneumonia    Seizures (HCC)    Sleep apnea     Past Surgical History:  Procedure Laterality Date   BACK SURGERY     carotid artery surgery Right 10/07/2012   carotid ultrasound  06/2015   CHOLECYSTECTOMY  1981   COLONOSCOPY  2014   DIAGNOSTIC MAMMOGRAM  03/2017   groin lypnoid Left 03/2015   LUMBAR WOUND DEBRIDEMENT N/A 08/02/2020   Procedure: IRRIGATE AND DEBRIDEMENT OF LUMBAR WOUND, PLACEMENT OF WOUND VAC;  Surgeon: Dawley, Alan Mulder, DO;  Location: [facility] OR;  Service: Neurosurgery;  Laterality: N/A;   right heel spur  1994    SPINAL CORD STIMULATOR INSERTION N/A 05/03/2021   Procedure: Spinal Cord Stimulator Placement;  Surgeon: Renaldo Fiddler, MD;  Location: [facility] OR;  Service: Neurosurgery;  Laterality: N/A;   TUBAL LIGATION  1985   wrist trigger release Right 01/2016    Family History  Problem Relation Age of Onset   Diabetes Mother    Heart disease Mother    Stomach cancer Mother    Diabetes Father    Heart disease Father    Lung cancer Father     Social History   Socioeconomic History   Marital status: Married    Spouse name: Fayrene Fearing   Number of children: Not on file   Years of education:  Not on file   Highest education level: Not on file  Occupational History   Not on file  Tobacco Use   Smoking status: Former    Packs/day: 1.00    Types: Cigarettes    Quit date: 04/23/2021    Years since quitting: 0.5   Smokeless tobacco: Never  Vaping Use   Vaping Use: Never used  Substance and Sexual Activity   Alcohol use: Not Currently   Drug use: Not Currently   Sexual activity: Not on file  Other Topics Concern   Not on file  Social History Narrative   Lives with husband   left Handed   Drinks caffeine rarely   Social Determinants of Health   Financial Resource Strain: Not on file  Food Insecurity: Not on file  Transportation Needs: Not on file  Physical Activity: Not on file  Stress: Not on file  Social Connections: Not on file  Intimate Partner Violence: Not on file    Review of Systems  Constitutional:  Negative for fatigue.  Respiratory:  Positive for cough. Negative for shortness of breath.   Psychiatric/Behavioral:  Positive for sleep disturbance.    Vitals:   11/06/21 1606  BP: 134/82  Pulse: 62  SpO2: 97%     Physical Exam Constitutional:      Appearance: She is obese.  HENT:     Head: Normocephalic.     Nose: Nose normal.     Mouth/Throat:     Mouth: Mucous membranes are moist.     Comments: Mallampati 3, crowded oropharynx Cardiovascular:     Rate and Rhythm:  Normal rate and regular rhythm.     Heart sounds: No murmur heard.   No friction rub.  Pulmonary:     Effort: No respiratory distress.     Breath sounds: No stridor. No wheezing or rhonchi.  Musculoskeletal:     Cervical back: No rigidity or tenderness.  Neurological:     Mental Status: She is alert.  Psychiatric:        Mood and Affect: Mood normal.    Data Reviewed: No PFT on record  Most recent chest x-ray 08/27/2021 with some atelectasis, bibasal haziness  Assessment:  COPD -Severity is unclear -She recently had a PFT, will request for study  Surgical clearance -Not having significant exacerbation at the present time -Recently tolerated back surgery without problems  Has since quit smoking  Limited with activities of daily living with balance issues, musculoskeletal pain  History of obstructive sleep apnea -Intolerant of BiPAP -Counseled about the need to use BiPAP -Mask changes -Increased effort to try and figure out what may need adjusted  Plan/Recommendations: She is cleared for surgery as long as she is not having any exacerbation  Risk of decompensation is minimal as long as not having any acute exacerbation prior to surgery  Will obtain some records including recent pulmonary function tests  Encouraged to start using BiPAP  Tentative follow-up in about 3 months  Surgeon/anesthesia should be aware of the history of hypercapnic respiratory failure, obstructive sleep apnea   Virl Diamond MD [facility] 11/06/2021, 4:56 PM  CC: Medicine, [place] [facility]*

## 2021-11-06 NOTE — Patient Instructions (Signed)
Record release for breathing study and any other breathing related investigation that was done recently  Continue current inhalers  Give the BiPAP a little bit more effort  Graded exercises as tolerated  Without taking too much risk, more effort walking with a cane, off the walker  I will see you in about 3 months  You are cleared to have surgery from a breathing perspective as long as you are not having any acute exacerbations  Call us with any significant concerns

## 2021-11-28 ENCOUNTER — Other Ambulatory Visit: Payer: Self-pay | Admitting: Infectious Diseases

## 2021-12-19 NOTE — Progress Notes (Addendum)
Anesthesia Review:  PCP: Sharion Balloon - clearance on chart  dated 09/28/21   Cardiologist :- DR Earna Coder - clearance on chart in ov note which is on chart dated 07/2021.   Have requested clearances from Ortho- They are to fax.  11/06/21- Pulm- LOv 11.14.22- DR Olalere  Neuro- 09/27/21- DR Teresa Coombs - clearance on chart  Pulm- DR Olalere- LOV 11/06/21 LOV on chart  Chest x-ray : 08/27/21- 1View  EKG : 08/27/21  Echo : Stress test: Cardiac Cath :  Activity level: can do a flight of stairs without difficulty  Sleep Study/ CPAP : does not use cpap  Fasting Blood Sugar :      / Checks Blood Sugar -- times a day:   Blood Thinner/ Instructions /Last Dose: ASA / Instructions/ Last Dose :   In hosp on 08/27/21 with acute resp failure positive for covid \ DM- type 2 checks glucose with Freestyle Libre  Hgba1c- 01/03/22-5.8  PT only takes 70/30 once a day in the am if glucose is greater than 90 per pt  Hx of seizures- last one - 09/2021 per pt  PT came for preop appt on 01/03/2022.  On arrival pt asked was she supposed to be fasting for labs today.  Instructed pt she did not have to be fasting for labs.  Pt stated she had not eaten anything this am and had taken her meds.  PT states she feels fine.  Glucose was 70.  PT given 6 peanut butter crackers and 8 ounces of shasta cola.  PT completed.  Rechecked glucose again and glucose was 107.  Leticia Clas made aware with no new orders given.  PT was leaving PST appt to eat breakfast.  Husband made aware of what glucose was and to take pt to eat breakfast.  Husband also voiced understanding.   Pt has Spinal Cord Stimulator - pt to bring remote day of surgery.

## 2021-12-25 ENCOUNTER — Telehealth: Payer: Self-pay

## 2021-12-25 ENCOUNTER — Other Ambulatory Visit: Payer: Self-pay | Admitting: Infectious Diseases

## 2021-12-25 NOTE — Telephone Encounter (Signed)
Attempted to reached patient by phone. Spouse answered phone as patient was not available at this time. Advise spouse that staff will reach out to her via Mychart. Valarie Cones

## 2021-12-25 NOTE — Telephone Encounter (Signed)
Ok to continue to fill Doxy?

## 2021-12-28 ENCOUNTER — Ambulatory Visit (INDEPENDENT_AMBULATORY_CARE_PROVIDER_SITE_OTHER): Payer: BC Managed Care – PPO | Admitting: Neurology

## 2021-12-28 ENCOUNTER — Encounter: Payer: Self-pay | Admitting: Neurology

## 2021-12-28 ENCOUNTER — Other Ambulatory Visit: Payer: Self-pay

## 2021-12-28 ENCOUNTER — Ambulatory Visit: Payer: BC Managed Care – PPO | Admitting: Neurology

## 2021-12-28 VITALS — BP 126/66 | HR 69 | Ht 66.0 in | Wt 211.5 lb

## 2021-12-28 DIAGNOSIS — G40909 Epilepsy, unspecified, not intractable, without status epilepticus: Secondary | ICD-10-CM | POA: Diagnosis not present

## 2021-12-28 MED ORDER — LAMOTRIGINE 25 MG PO TABS: 25.0000 mg | ORAL_TABLET | Freq: Every day | ORAL | 0 refills | Status: DC

## 2021-12-28 MED ORDER — LEVETIRACETAM 500 MG PO TABS: 500.0000 mg | ORAL_TABLET | Freq: Two times a day (BID) | ORAL | 4 refills | Status: DC

## 2021-12-28 MED ORDER — LAMOTRIGINE 150 MG PO TABS: 150.0000 mg | ORAL_TABLET | Freq: Every day | ORAL | 0 refills | Status: DC

## 2021-12-28 NOTE — Patient Instructions (Signed)
Continue current medications   Keppra 500 mg twice daily  Follow up with your doctors as scheduled  Return in one year or sooner if worse

## 2021-12-28 NOTE — Progress Notes (Signed)
[facility]  PATIENT: Brenda Morgan DOB: [birth year]  REFERRING CLINICIAN: Medicine, [place] [facility]* HISTORY FROM: Patient and husband  REASON FOR VISIT: New onset Seizure    HISTORICAL  CHIEF COMPLAINT:  Chief Complaint  Patient presents with   Follow-up    Rm 12. Accompanied by husband. Pt takes Levetiracetam 500 mg BID. Denies any new seizures.    INTERVAL HISTORY 12/28/2021: Patient present today for follow-up with husband, last visit was on October 5, at that time plan was to continue Keppra 500 mg twice a day and recheck a level.  The Keppra level came back normal, 23.3.  Brain MRI was completed and showed no acute intracranial abnormality.  She denies any seizures since last visit and report compliance with medication.  She still pending left knee surgery scheduled for at the end of the month, currently state that she is in a lot of pain.  She is also on a lot of pain medication, does complain of daytime somnolence and occasional headaches, but no seizures.  She is currently not driving.   HISTORY OF PRESENT ILLNESS:  This is a 66 year old woman with extended medical history including fibromyalgia, chronic pain, recurrent UTIs, CKD, chronic liver cirrhosis, hyperlipidemia, hypertension, CHF, obstructive sleep apnea on BiPAP, GERD, prediabetes who is presenting with seizure-like activity 2 weeks ago.  Per husband patient was laying on the bed then he had a growling noise, he went to check on her and she was jerking her head back-and-forth, eyes rolled back, not breathing and foam coming out of the mouth.  Husband said that he put his fingers in patient mouth to put back her tongue.  He called EMS when EMS arrived patient had a second event very similar with head-bobbing, jerking involving the head and arms, episode lasting more than 2 minutes.  He was taken to the local hospital where she had a head CT which was negative and a routine EEG who was reported to be  normal. She was also found to be have a lactic acid up to 5.1.She was started on 500 mg twice daily Keppra.  She was in the hospital for a total of 3 days. Husband states that she was confused in the first 2 days of admission.  Since being discharged from the hospital, patient does not have any seizure-like activity.  She denies any previous history of seizures, denies any family history of seizures, denies any history of head trauma and denies overall any seizure risk factor.  She is on lamotrigine for mood.    Handedness: Right   Seizure Type: Generalized   Current frequency: First time seizure  Any injuries from seizures: None   Seizure risk factors: None reported, no strokes,no head trauma   Previous ASMs: None   Currenty ASMs: Levetiracetam 500 mg BID   ASMs side effects: None   Brain Images: CT head normal   Previous EEGs: No abnormalities    OTHER MEDICAL CONDITIONS: Fibromyalgia, chronic pain, recurrent UTIs, CKD, chronic liver cirrhosis, hyperlipidemia, hypertension, CHF, obstructive sleep apnea on BiPAP, GERD, prediabetes  REVIEW OF SYSTEMS: Full 14 system review of systems performed and negative with exception of: as noted in the HPI.   ALLERGIES: Allergies  Allergen Reactions   Amlodipine Anaphylaxis, Swelling and Other (See Comments)    Tongue swelling    Naltrexone Hives   Penicillins Hives and Other (See Comments)    Tolerated ceftriaxone and cefepime 06/2017 Hives     Penicillin G     HOME  MEDICATIONS: Outpatient Medications Prior to Visit  Medication Sig Dispense Refill   acetaminophen (TYLENOL) 500 MG tablet Take 1,000 mg by mouth every 6 (six) hours as needed for moderate pain, headache or fever.     atorvastatin (LIPITOR) 10 MG tablet Take 5 mg by mouth daily.     azelastine (ASTELIN) 0.1 % nasal spray Place 2 sprays into both nostrils 2 (two) times daily as needed for rhinitis (congestion).      baclofen (LIORESAL) 20 MG tablet Take 20 mg by mouth  every 8 (eight) hours.     carvedilol (COREG) 25 MG tablet Take 25 mg by mouth daily.     cetirizine (ZYRTEC) 10 MG tablet Take 10 mg by mouth daily.     COLLAGEN PO Take 1 tablet by mouth daily.     Continuous Blood Gluc Sensor (FREESTYLE LIBRE 14 DAY SENSOR) MISC Apply topically every 14 (fourteen) days.     Continuous Blood Gluc Sensor (FREESTYLE LIBRE 14 DAY SENSOR) MISC APPLY 1 SENSOR EVERY 14 DAYS AS DIRECTED     diclofenac Sodium (VOLTAREN) 1 % GEL Apply 4 g topically 4 (four) times daily. (Patient taking differently: Apply 4 g topically daily as needed (pain).) 100 g 0   doxycycline (VIBRAMYCIN) 100 MG capsule TAKE 1 CAPSULE BY MOUTH TWICE A DAY 60 capsule 0   DULoxetine (CYMBALTA) 30 MG capsule Take 30 mg by mouth daily.     Empagliflozin-metFORMIN HCl ER (SYNJARDY XR) 25-1000 MG TB24 Take 1 tablet by mouth daily.     famotidine (PEPCID) 40 MG tablet Take 40 mg by mouth at bedtime.     fentaNYL (DURAGESIC) 50 MCG/HR Place 1 patch onto the skin every 3 (three) days.     ferrous sulfate 325 (65 FE) MG tablet Take 325 mg by mouth daily with breakfast.     Fluticasone-Salmeterol (ADVAIR) 250-50 MCG/DOSE AEPB Inhale 1 puff into the lungs 2 (two) times daily as needed (shortness of breath/wheezing).      furosemide (LASIX) 40 MG tablet Take 40 mg by mouth daily.     hydrOXYzine (ATARAX/VISTARIL) 25 MG tablet Take 25 mg by mouth at bedtime.     icosapent Ethyl (VASCEPA) 1 g capsule Take 2 g by mouth 2 (two) times daily.     insulin aspart protamine - aspart (NOVOLOG MIX 70/30 FLEXPEN) (70-30) 100 UNIT/ML FlexPen Inject into the skin daily as needed (High blood sugar). Sliding scale     losartan (COZAAR) 25 MG tablet Take 25 mg by mouth daily.     lubiprostone (AMITIZA) 24 MCG capsule Take 24 mcg by mouth daily.     magnesium oxide (MAG-OX) 400 MG tablet Take 400 mg by mouth daily.     metolazone (ZAROXOLYN) 5 MG tablet Take 5 mg by mouth 3 (three) times a week. Sunday,Wednesday,friday      oxyCODONE-acetaminophen (PERCOCET) 10-325 MG tablet Take 1 tablet by mouth every 8 (eight) hours as needed for pain.     OZEMPIC, 1 MG/DOSE, 4 MG/3ML SOPN Inject 1 mg into the skin once a week. Take on Sunday     pantoprazole (PROTONIX) 40 MG tablet Take 40 mg by mouth daily.     pioglitazone (ACTOS) 15 MG tablet Take 15 mg by mouth daily.     potassium chloride SA (KLOR-CON) 20 MEQ tablet Take 20 mEq by mouth daily.     pramipexole (MIRAPEX) 0.5 MG tablet Take 1 mg by mouth at bedtime.     pregabalin (LYRICA) 200 MG capsule  Take 200 mg by mouth in the morning, at noon, and at bedtime.     spironolactone (ALDACTONE) 50 MG tablet Take 50 mg by mouth daily.     traZODone (DESYREL) 100 MG tablet Take 100 mg by mouth at bedtime.     lamoTRIgine (LAMICTAL) 150 MG tablet Take 150 mg by mouth at bedtime. Take with 25 for a total of 175 mg at bedtime     lamoTRIgine (LAMICTAL) 25 MG tablet Take 25 mg by mouth at bedtime. Take with 150 mg for a total of 175 mg at bedtime     levETIRAcetam (KEPPRA) 500 MG tablet Take 1 tablet (500 mg total) by mouth 2 (two) times daily. 60 tablet 12   No facility-administered medications prior to visit.    PAST MEDICAL HISTORY: Past Medical History:  Diagnosis Date   Anemia    Anxiety    Arthritis    COPD (chronic obstructive pulmonary disease) ([place])    Diabetes mellitus without complication ([place])    Fatty liver 2012   per pt 06/24/20   GERD (gastroesophageal reflux disease)    Heart murmur    Hypertension    Pneumonia    Seizures ([place])    Sleep apnea     PAST SURGICAL HISTORY: Past Surgical History:  Procedure Laterality Date   BACK SURGERY     carotid artery surgery Right 10/07/2012   carotid ultrasound  06/2015   CHOLECYSTECTOMY  1981   COLONOSCOPY  2014   DIAGNOSTIC MAMMOGRAM  03/2017   groin lypnoid Left 03/2015   LUMBAR WOUND DEBRIDEMENT N/A 08/02/2020   Procedure: IRRIGATE AND DEBRIDEMENT OF LUMBAR WOUND, PLACEMENT OF WOUND VAC;  Surgeon:  Dawley, Theodoro Doing, DO;  Location: [place];  Service: Neurosurgery;  Laterality: N/A;   right heel spur  1994   SPINAL CORD STIMULATOR INSERTION N/A 05/03/2021   Procedure: Spinal Cord Stimulator Placement;  Surgeon: Reece Agar, MD;  Location: [place];  Service: Neurosurgery;  Laterality: N/A;   TUBAL LIGATION  1985   wrist trigger release Right 01/2016    FAMILY HISTORY: Family History  Problem Relation Age of Onset   Diabetes Mother    Heart disease Mother    Stomach cancer Mother    Diabetes Father    Heart disease Father    Lung cancer Father     SOCIAL HISTORY: Social History   Socioeconomic History   Marital status: Married    Spouse name: Jeneen Rinks   Number of children: Not on file   Years of education: Not on file   Highest education level: Not on file  Occupational History   Not on file  Tobacco Use   Smoking status: Former    Packs/day: 1.00    Types: Cigarettes    Quit date: 04/23/2021    Years since quitting: 0.6   Smokeless tobacco: Never  Vaping Use   Vaping Use: Never used  Substance and Sexual Activity   Alcohol use: Not Currently   Drug use: Not Currently   Sexual activity: Not on file  Other Topics Concern   Not on file  Social History Narrative   Lives with husband   left Handed   Drinks caffeine rarely   Social Determinants of Health   Financial Resource Strain: Not on file  Food Insecurity: Not on file  Transportation Needs: Not on file  Physical Activity: Not on file  Stress: Not on file  Social Connections: Not on file  Intimate Partner Violence: Not on  file     PHYSICAL EXAM  GENERAL EXAM/CONSTITUTIONAL: Vitals:  Vitals:   12/28/21 1241  BP: 126/66  Pulse: 69  Weight: 211 lb 8 oz (95.9 kg)  Height: 5\' 6"  (1.676 m)    Body mass index is 34.14 kg/m. Wt Readings from Last 3 Encounters:  12/28/21 211 lb 8 oz (95.9 kg)  11/06/21 201 lb (91.2 kg)  09/27/21 195 lb (88.5 kg)   Patient is in no distress; well developed, nourished  and groomed; neck is supple  EYES: Pupils round and reactive to light, Visual fields full to confrontation, Extraocular movements intacts,  No results found.  MUSCULOSKELETAL: Gait, strength, tone, movements noted in Neurologic exam below  NEUROLOGIC: MENTAL STATUS:  No flowsheet data found. awake, alert, oriented to person, place and time recent and remote memory intact normal attention and concentration language fluent, comprehension intact, naming intact fund of knowledge appropriate  CRANIAL NERVE:  2nd, 3rd, 4th, 6th - pupils equal and reactive to light, visual fields full to confrontation, extraocular muscles intact, no nystagmus 5th - facial sensation symmetric 7th - facial strength symmetric 8th - hearing intact 9th - palate elevates symmetrically, uvula midline 11th - shoulder shrug symmetric 12th - tongue protrusion midline  MOTOR:  normal bulk and tone, full strength in the BUE, BLE  SENSORY:  normal and symmetric to light touch, pinprick, temperature, vibration  GAIT/STATION:  Walks with a walker     DIAGNOSTIC DATA (LABS, IMAGING, TESTING) - I reviewed patient records, labs, notes, testing and imaging myself where available.  Lab Results  Component Value Date   WBC 8.9 08/31/2021   HGB 12.2 08/31/2021   HCT 38.2 08/31/2021   MCV 90.3 08/31/2021   PLT 226 08/31/2021      Component Value Date/Time   NA 137 08/31/2021 0336   K 4.8 08/31/2021 0336   CL 98 08/31/2021 0336   CO2 31 08/31/2021 0336   GLUCOSE 124 (H) 08/31/2021 0336   BUN 38 (H) 08/31/2021 0336   CREATININE 1.14 (H) 08/31/2021 0336   CALCIUM 9.4 08/31/2021 0336   PROT 7.0 08/31/2021 0336   ALBUMIN 3.2 (L) 08/31/2021 0336   AST 14 (L) 08/31/2021 0336   ALT 15 08/31/2021 0336   ALKPHOS 78 08/31/2021 0336   BILITOT 0.5 08/31/2021 0336   GFRNONAA 54 (L) 08/31/2021 0336   GFRAA >60 09/23/2020 1328   No results found for: CHOL, HDL, LDLCALC, LDLDIRECT, TRIG Lab Results  Component  Value Date   HGBA1C 5.9 (H) 08/28/2021   No results found for: VITAMINB12 Lab Results  Component Value Date   TSH 1.493 10/14/2020   Keppra level 23.3  Head CT [facility]): No acute intracranial abnormalities   Routine EEG: Reported normal.   Brain MRI 10/18/21 1. No acute intracranial abnormality. 2. Generalized volume loss and findings of chronic small vessel disease.   ASSESSMENT AND PLAN  66 y.o. year old female  with Fibromyalgia, chronic pain, recurrent UTIs, CKD, chronic liver cirrhosis, hyperlipidemia, hypertension, CHF, obstructive sleep apnea on BiPAP, GERD, prediabetes here for follow up for her seizure.  She denies any seizures since last visit, reports compliance with medication.  She is pending left knee surgery scheduled at the end of the month.  I recommended patient to increase her nightly dose the night before the surgery by taking 1000 mg and to resume with 500 mg twice daily after surgery.  I will see her in 1 year for follow-up.  At that time if she remains seizure-free  we will discuss about coming off medication.  Return sooner if worse.  Patient understands to not drive until April.   1. Seizure disorder [facility])     Patient Instructions  Continue current medications   Keppra 500 mg twice daily  Follow up with your doctors as scheduled  Return in one year or sooner if worse     Per [facility] statutes, patients with seizures are not allowed to drive until they have been seizure-free for six months.  Other recommendations include using caution when using heavy equipment or power tools. Avoid working on ladders or at heights. Take showers instead of baths.  Do not swim alone.  Ensure the water temperature is not too high on the home water heater. Do not go swimming alone. Do not lock yourself in a room alone (i.e. bathroom). When caring for infants or small children, sit down when holding, feeding, or changing them to minimize risk of injury to the child in  the event you have a seizure. Maintain good sleep hygiene. Avoid alcohol.  Also recommend adequate sleep, hydration, good diet and minimize stress.   During the Seizure  - First, ensure adequate ventilation and place patients on the floor on their left side  Loosen clothing around the neck and ensure the airway is patent. If the patient is clenching the teeth, do not force the mouth open with any object as this can cause severe damage - Remove all items from the surrounding that can be hazardous. The patient may be oblivious to what's happening and may not even know what he or she is doing. If the patient is confused and wandering, either gently guide him/her away and block access to outside areas - Reassure the individual and be comforting - Call 911. In most cases, the seizure ends before EMS arrives. However, there are cases when seizures may last over 3 to 5 minutes. Or the individual may have developed breathing difficulties or severe injuries. If a pregnant patient or a person with diabetes develops a seizure, it is prudent to call an ambulance. - Finally, if the patient does not regain full consciousness, then call EMS. Most patients will remain confused for about 45 to 90 minutes after a seizure, so you must use judgment in calling for help. - Avoid restraints but make sure the patient is in a bed with padded side rails - Place the individual in a lateral position with the neck slightly flexed; this will help the saliva drain from the mouth and prevent the tongue from falling backward - Remove all nearby furniture and other hazards from the area - Provide verbal assurance as the individual is regaining consciousness - Provide the patient with privacy if possible - Call for help and start treatment as ordered by the caregiver   After the Seizure (Postictal Stage)  After a seizure, most patients experience confusion, fatigue, muscle pain and/or a headache. Thus, one should permit the  individual to sleep. For the next few days, reassurance is essential. Being calm and helping reorient the person is also of importance.  Most seizures are painless and end spontaneously. Seizures are not harmful to others but can lead to complications such as stress on the lungs, brain and the heart. Individuals with prior lung problems may develop labored breathing and respiratory distress.     No orders of the defined types were placed in this encounter.    Meds ordered this encounter  Medications   levETIRAcetam (KEPPRA) 500 MG tablet  Sig: Take 1 tablet (500 mg total) by mouth 2 (two) times daily.    Dispense:  180 tablet    Refill:  4   lamoTRIgine (LAMICTAL) 150 MG tablet    Sig: Take 1 tablet (150 mg total) by mouth at bedtime. Take with 25 for a total of 175 mg at bedtime    Dispense:  30 tablet    Refill:  0   lamoTRIgine (LAMICTAL) 25 MG tablet    Sig: Take 1 tablet (25 mg total) by mouth at bedtime. Take with 150 mg for a total of 175 mg at bedtime    Dispense:  30 tablet    Refill:  0     Return in about 1 year (around 12/28/2022).    Alric Ran, MD 12/28/2021, 1:13 PM  [facility] [address], [place], [place] [address] [phone]

## 2022-01-01 NOTE — Progress Notes (Signed)
Covid test on 1/20/2023at 0900am.  Come in thru Main entrance.  Have a seat in the lobby to the right.  Call [phone] and give them your name.  Andlet them know you are here for covid testing .                  Your procedure is scheduled on:  1/124/2023.   Report to [facility] Main  Entrance   Report to admitting at   [phone]     Call this number if you have problems the morning of surgery [phone]    REMEMBER: NO  SOLID FOOD CANDY OR GUM AFTER MIDNIGHT. CLEAR LIQUIDS UNTIL  0430am        . NOTHING BY MOUTH EXCEPT CLEAR LIQUIDS UNTIL  0430am   . PLEASE FINISH ENSURE DRINK PER SURGEON ORDER  WHICH NEEDS TO BE COMPLETED AT   0430am    .      CLEAR LIQUID DIET   Foods Allowed                                                                    Coffee and tea, regular and decaf                            Fruit ices (not with fruit pulp)                                      Iced Popsicles                                    Carbonated beverages, regular and diet                                    Cranberry, grape and apple juices Sports drinks like Gatorade Lightly seasoned clear broth or consume(fat free) Sugar, honey syrup ___________________________________________________________________      BRUSH YOUR TEETH MORNING OF SURGERY AND RINSE YOUR MOUTH OUT, NO CHEWING GUM CANDY OR MINTS.     Take these medicines the morning of surgery with A SIP OF WATER:  coreg, zyrtec, inhalers as usual and bring, cymbalta, keppra, protonix, lyrica      DO NOT TAKE ANY DIABETIC MEDICATIONS DAY OF YOUR SURGERY                               You may not have any metal on your body including hair pins and              piercings  Do not wear jewelry, make-up, lotions, powders or perfumes, deodorant             Do not wear nail polish on your fingernails.  Do not shave  48 hours prior to surgery.              Men may shave face and neck.   Do not bring valuables to the  hospital.  [facility] IS NOT             RESPONSIBLE   FOR VALUABLES.  Contacts, dentures or bridgework may not be worn into surgery.  Leave suitcase in the car. After surgery it may be brought to your room.     Patients discharged the day of surgery will not be allowed to drive home. IF YOU ARE HAVING SURGERY AND GOING HOME THE SAME DAY, YOU MUST HAVE AN ADULT TO DRIVE YOU HOME AND BE WITH YOU FOR 24 HOURS. YOU MAY GO HOME BY TAXI OR UBER OR ORTHERWISE, BUT AN ADULT MUST ACCOMPANY YOU HOME AND STAY WITH YOU FOR 24 HOURS.  Name and phone number of your driver:  Special Instructions: N/A              Please read over the following fact sheets you were given: _____________________________________________________________________  [facility] - Preparing for Surgery Before surgery, you can play an important role.  Because skin is not sterile, your skin needs to be as free of germs as possible.  You can reduce the number of germs on your skin by washing with CHG (chlorahexidine gluconate) soap before surgery.  CHG is an antiseptic cleaner which kills germs and bonds with the skin to continue killing germs even after washing. Please DO NOT use if you have an allergy to CHG or antibacterial soaps.  If your skin becomes reddened/irritated stop using the CHG and inform your nurse when you arrive at Short Stay. Do not shave (including legs and underarms) for at least 48 hours prior to the first CHG shower.  You may shave your face/neck. Please follow these instructions carefully:  1.  Shower with CHG Soap the night before surgery and the  morning of Surgery.  2.  If you choose to wash your hair, wash your hair first as usual with your  normal  shampoo.  3.  After you shampoo, rinse your hair and body thoroughly to remove the  shampoo.                           4.  Use CHG as you would any other liquid soap.  You can apply chg directly  to the skin and wash                       Gently with a scrungie or clean  washcloth.  5.  Apply the CHG Soap to your body ONLY FROM THE NECK DOWN.   Do not use on face/ open                           Wound or open sores. Avoid contact with eyes, ears mouth and genitals (private parts).                       Wash face,  Genitals (private parts) with your normal soap.             6.  Wash thoroughly, paying special attention to the area where your surgery  will be performed.  7.  Thoroughly rinse your body with warm water from the neck down.  8.  DO NOT shower/wash with your normal soap after using and rinsing off  the CHG Soap.                9.  Pat yourself  dry with a clean towel.            10.  Wear clean pajamas.            11.  Place clean sheets on your bed the night of your first shower and do not  sleep with pets. Day of Surgery : Do not apply any lotions/deodorants the morning of surgery.  Please wear clean clothes to the hospital/surgery center.  FAILURE TO FOLLOW THESE INSTRUCTIONS MAY RESULT IN THE CANCELLATION OF YOUR SURGERY PATIENT SIGNATURE_________________________________  NURSE SIGNATURE__________________________________  ________________________________________________________________________

## 2022-01-03 ENCOUNTER — Other Ambulatory Visit: Payer: Self-pay

## 2022-01-03 ENCOUNTER — Encounter (HOSPITAL_COMMUNITY): Payer: Self-pay

## 2022-01-03 ENCOUNTER — Encounter (HOSPITAL_COMMUNITY)
Admission: RE | Admit: 2022-01-03 | Discharge: 2022-01-03 | Disposition: A | Discharge status: Home / Self Care | Payer: BC Managed Care – PPO | Source: Ambulatory Visit | Attending: Orthopedic Surgery | Admitting: Orthopedic Surgery

## 2022-01-03 VITALS — BP 136/58 | HR 66 | Temp 97.9°F | Resp 16 | Ht 66.0 in | Wt 211.5 lb

## 2022-01-03 DIAGNOSIS — I10 Essential (primary) hypertension: Secondary | ICD-10-CM

## 2022-01-03 DIAGNOSIS — I129 Hypertensive chronic kidney disease with stage 1 through stage 4 chronic kidney disease, or unspecified chronic kidney disease: Secondary | ICD-10-CM | POA: Insufficient documentation

## 2022-01-03 DIAGNOSIS — N189 Chronic kidney disease, unspecified: Secondary | ICD-10-CM | POA: Insufficient documentation

## 2022-01-03 DIAGNOSIS — R569 Unspecified convulsions: Secondary | ICD-10-CM | POA: Diagnosis not present

## 2022-01-03 DIAGNOSIS — Z981 Arthrodesis status: Secondary | ICD-10-CM | POA: Insufficient documentation

## 2022-01-03 DIAGNOSIS — E1151 Type 2 diabetes mellitus with diabetic peripheral angiopathy without gangrene: Secondary | ICD-10-CM | POA: Diagnosis not present

## 2022-01-03 DIAGNOSIS — Z01818 Encounter for other preprocedural examination: Secondary | ICD-10-CM

## 2022-01-03 DIAGNOSIS — Z01812 Encounter for preprocedural laboratory examination: Secondary | ICD-10-CM | POA: Diagnosis present

## 2022-01-03 DIAGNOSIS — M1712 Unilateral primary osteoarthritis, left knee: Secondary | ICD-10-CM | POA: Diagnosis not present

## 2022-01-03 DIAGNOSIS — E1122 Type 2 diabetes mellitus with diabetic chronic kidney disease: Secondary | ICD-10-CM | POA: Diagnosis not present

## 2022-01-03 DIAGNOSIS — J449 Chronic obstructive pulmonary disease, unspecified: Secondary | ICD-10-CM | POA: Insufficient documentation

## 2022-01-03 DIAGNOSIS — Z9682 Presence of neurostimulator: Secondary | ICD-10-CM | POA: Diagnosis not present

## 2022-01-03 HISTORY — DX: Heart failure, unspecified: I50.9

## 2022-01-03 HISTORY — DX: Peripheral vascular disease, unspecified: I73.9

## 2022-01-03 LAB — CBC
HCT: 38.6 % (ref 36.0–46.0)
Hemoglobin: 12.2 g/dL (ref 12.0–15.0)
MCH: 29.6 pg (ref 26.0–34.0)
MCHC: 31.6 g/dL (ref 30.0–36.0)
MCV: 93.7 fL (ref 80.0–100.0)
Platelets: 181 10*3/uL (ref 150–400)
RBC: 4.12 MIL/uL (ref 3.87–5.11)
RDW: 13 % (ref 11.5–15.5)
WBC: 9.3 10*3/uL (ref 4.0–10.5)
nRBC: 0 % (ref 0.0–0.2)

## 2022-01-03 LAB — BASIC METABOLIC PANEL
Anion gap: 4 — ABNORMAL LOW (ref 5–15)
BUN: 30 mg/dL — ABNORMAL HIGH (ref 8–23)
CO2: 32 mmol/L (ref 22–32)
Calcium: 9.1 mg/dL (ref 8.9–10.3)
Chloride: 100 mmol/L (ref 98–111)
Creatinine, Ser: 1.29 mg/dL — ABNORMAL HIGH (ref 0.44–1.00)
GFR, Estimated: 46 mL/min — ABNORMAL LOW (ref >60)
Glucose, Bld: 112 mg/dL — ABNORMAL HIGH (ref 70–99)
Potassium: 4.7 mmol/L (ref 3.5–5.1)
Sodium: 136 mmol/L (ref 135–145)

## 2022-01-03 LAB — GLUCOSE, CAPILLARY
Glucose-Capillary: 70 mg/dL (ref 70–99)
Glucose-Capillary: 107 mg/dL — ABNORMAL HIGH (ref 70–99)

## 2022-01-03 LAB — SURGICAL PCR SCREEN
MRSA, PCR: NEGATIVE
Staphylococcus aureus: NEGATIVE

## 2022-01-03 LAB — HEMOGLOBIN A1C
Hgb A1c MFr Bld: 5.8 % — ABNORMAL HIGH (ref 4.8–5.6)
Mean Plasma Glucose: 119.76 mg/dL

## 2022-01-04 NOTE — H&P (Signed)
KNEE ARTHROPLASTY ADMISSION H&P  Patient ID: Brenda Morgan MRN: [id number] DOB/AGE: 1956-05-12 66 y.o.  Chief Complaint: left knee pain.  Planned Procedure Date: 01-16-22 Medical Clearance by Wandra Scot NP   Cardiac Clearance by Dr. Theodosia Paling Pulmonary clearance by Dr. Ander Slade   HPI: Brenda Morgan is a 66 y.o. female who presents for evaluation of OA LEFT KNEE. The patient has a history of pain and functional disability in the left knee due to arthritis and has failed non-surgical conservative treatments for greater than 12 weeks to include NSAID's and/or analgesics, corticosteriod injections, supervised PT with diminished ADL's post treatment, use of assistive devices, and activity modification.  Onset of symptoms was gradual, starting >10 years ago with gradually worsening course since that time. The patient noted prior procedures on the knee to include  arthroscopy and menisectomy on the left knee.  Patient currently rates pain at 10 out of 10 with activity. Patient has night pain, worsening of pain with activity and weight bearing, and pain that interferes with activities of daily living.  Patient has evidence of subchondral sclerosis and joint space narrowing by imaging studies.  There is no active infection.  Past Medical History:  Diagnosis Date   Anxiety    Arthritis    CHF (congestive heart failure) (HCC)    COPD (chronic obstructive pulmonary disease) ([place])    Diabetes mellitus without complication ([place])    Fatty liver 2012   per pt 06/24/20   GERD (gastroesophageal reflux disease)    Heart murmur    Hypertension    Peripheral vascular disease (HCC)    Seizures ([place])    Sleep apnea    Past Surgical History:  Procedure Laterality Date   BACK SURGERY     carotid artery surgery Right 10/07/2012   carotid ultrasound  06/2015   CHOLECYSTECTOMY  1981   COLONOSCOPY  2014   DIAGNOSTIC MAMMOGRAM  03/2017   groin lypnoid Left 03/2015   LUMBAR WOUND DEBRIDEMENT  N/A 08/02/2020   Procedure: IRRIGATE AND DEBRIDEMENT OF LUMBAR WOUND, PLACEMENT OF WOUND VAC;  Surgeon: Dawley, Theodoro Doing, DO;  Location: [facility];  Service: Neurosurgery;  Laterality: N/A;   right heel spur  1994   SPINAL CORD STIMULATOR INSERTION N/A 05/03/2021   Procedure: Spinal Cord Stimulator Placement;  Surgeon: Reece Agar, MD;  Location: [facility];  Service: Neurosurgery;  Laterality: N/A;   TUBAL LIGATION  1985   wrist trigger release Right 01/2016   Allergies  Allergen Reactions   Amlodipine Anaphylaxis, Swelling and Other (See Comments)    Tongue swelling    Naltrexone Hives   Penicillins Hives and Other (See Comments)    Tolerated ceftriaxone and cefepime 06/2017 Hives     Penicillin G    Prior to Admission medications   Medication Sig Start Date End Date Taking? Authorizing Provider  acetaminophen (TYLENOL) 500 MG tablet Take 1,000 mg by mouth every 6 (six) hours as needed for moderate pain, headache or fever.   Yes [provider]  atorvastatin (LIPITOR) 10 MG tablet Take 5 mg by mouth daily.   Yes [provider]  azelastine (ASTELIN) 0.1 % nasal spray Place 2 sprays into both nostrils 2 (two) times daily as needed for rhinitis (congestion).  07/30/20  Yes [provider]  baclofen (LIORESAL) 20 MG tablet Take 20 mg by mouth every 8 (eight) hours. 12/05/21  Yes [provider]  carvedilol (COREG) 25 MG tablet Take 25 mg by mouth daily.   Yes [provider]  cetirizine (ZYRTEC) 10 MG tablet Take 10 mg by mouth daily.   Yes [provider]  COLLAGEN PO Take 1 tablet by mouth daily.   Yes [provider]  diclofenac Sodium (VOLTAREN) 1 % GEL Apply 4 g topically 4 (four) times daily. Patient taking differently: Apply 4 g topically daily as needed (pain). 09/23/20  Yes Deno Etienne, DO  DULoxetine (CYMBALTA) 30 MG capsule Take 30 mg by mouth daily.   Yes [provider]  Empagliflozin-metFORMIN HCl ER (SYNJARDY XR)  25-1000 MG TB24 Take 1 tablet by mouth daily.   Yes [provider]  famotidine (PEPCID) 40 MG tablet Take 40 mg by mouth at bedtime. 09/09/20  Yes [provider]  fentaNYL (DURAGESIC) 50 MCG/HR Place 1 patch onto the skin every 3 (three) days. 09/15/21  Yes [provider]  ferrous sulfate 325 (65 FE) MG tablet Take 325 mg by mouth daily with breakfast.   Yes [provider]  Fluticasone-Salmeterol (ADVAIR) 250-50 MCG/DOSE AEPB Inhale 1 puff into the lungs 2 (two) times daily as needed (shortness of breath/wheezing).    Yes [provider]  furosemide (LASIX) 40 MG tablet Take 40 mg by mouth daily.   Yes [provider]  hydrOXYzine (ATARAX/VISTARIL) 25 MG tablet Take 25 mg by mouth at bedtime. 07/30/20  Yes [provider]  icosapent Ethyl (VASCEPA) 1 g capsule Take 2 g by mouth 2 (two) times daily.   Yes [provider]  insulin aspart protamine - aspart (NOVOLOG MIX 70/30 FLEXPEN) (70-30) 100 UNIT/ML FlexPen Inject into the skin daily as needed (High blood sugar). Sliding scale Per pt at preop appt pt takes once a day in the am if glucose if greater than 90. 09/15/21  Yes [provider]  losartan (COZAAR) 25 MG tablet Take 25 mg by mouth daily. 09/28/20  Yes [provider]  lubiprostone (AMITIZA) 24 MCG capsule Take 24 mcg by mouth daily. 09/25/21  Yes [provider]  magnesium oxide (MAG-OX) 400 MG tablet Take 400 mg by mouth daily.   Yes [provider]  metolazone (ZAROXOLYN) 5 MG tablet Take 5 mg by mouth 3 (three) times a week. Sunday,Wednesday,friday   Yes [provider]  oxyCODONE-acetaminophen (PERCOCET) 10-325 MG tablet Take 1 tablet by mouth every 8 (eight) hours as needed for pain. 12/13/21  Yes [provider]  OZEMPIC, 1 MG/DOSE, 4 MG/3ML SOPN Inject 1 mg into the skin once a week. Take on Sunday 09/14/20  Yes [provider]  pantoprazole (PROTONIX) 40  MG tablet Take 40 mg by mouth daily.   Yes [provider]  pioglitazone (ACTOS) 15 MG tablet Take 15 mg by mouth daily.   Yes [provider]  potassium chloride SA (KLOR-CON) 20 MEQ tablet Take 20 mEq by mouth daily.   Yes [provider]  pramipexole (MIRAPEX) 0.5 MG tablet Take 1 mg by mouth at bedtime.   Yes [provider]  pregabalin (LYRICA) 200 MG capsule Take 200 mg by mouth in the morning, at noon, and at bedtime.   Yes [provider]  spironolactone (ALDACTONE) 50 MG tablet Take 50 mg by mouth daily.   Yes [provider]  traZODone (DESYREL) 100 MG tablet Take 100 mg by mouth at bedtime.   Yes [provider]  Continuous Blood Gluc Sensor (FREESTYLE LIBRE 14 DAY SENSOR) MISC Apply topically every 14 (fourteen) days. 08/17/21   [provider]  Continuous Blood Gluc  Sensor (FREESTYLE LIBRE 14 DAY SENSOR) MISC APPLY 1 SENSOR EVERY 14 DAYS AS DIRECTED 02/24/21   [provider]  doxycycline (VIBRAMYCIN) 100 MG capsule TAKE 1 CAPSULE BY MOUTH TWICE A DAY 12/26/21   Henrieville Callas, NP  lamoTRIgine (LAMICTAL) 150 MG tablet Take 1 tablet (150 mg total) by mouth at bedtime. Take with 25 for a total of 175 mg at bedtime 12/28/21 01/27/22  Alric Ran, MD  lamoTRIgine (LAMICTAL) 25 MG tablet Take 1 tablet (25 mg total) by mouth at bedtime. Take with 150 mg for a total of 175 mg at bedtime 12/28/21 01/27/22  Alric Ran, MD  levETIRAcetam (KEPPRA) 500 MG tablet Take 1 tablet (500 mg total) by mouth 2 (two) times daily. 12/28/21 03/28/22  Alric Ran, MD   Social History   Socioeconomic History   Marital status: Married    Spouse name: Jeneen Rinks   Number of children: Not on file   Years of education: Not on file   Highest education level: Not on file  Occupational History   Not on file  Tobacco Use   Smoking status: Former    Packs/day: 1.00    Types: Cigarettes    Quit date: 04/23/2021    Years since quitting: 0.7    Smokeless tobacco: Never  Vaping Use   Vaping Use: Never used  Substance and Sexual Activity   Alcohol use: Never   Drug use: Never   Sexual activity: Not on file  Other Topics Concern   Not on file  Social History Narrative   Lives with husband   left Handed   Drinks caffeine rarely   Social Determinants of Health   Financial Resource Strain: Not on file  Food Insecurity: Not on file  Transportation Needs: Not on file  Physical Activity: Not on file  Stress: Not on file  Social Connections: Not on file   Family History  Problem Relation Age of Onset   Diabetes Mother    Heart disease Mother    Stomach cancer Mother    Diabetes Father    Heart disease Father    Lung cancer Father     ROS: Currently denies lightheadedness, dizziness, Fever, chills, CP, SOB.   No personal history of DVT, PE, MI, or CVA. Has full dentures All other systems have been reviewed and were otherwise currently negative with the exception of those mentioned in the HPI and as above.  Objective: Vitals: Ht: 5'4" Wt: 211.8 lbs Temp: 98.2 BP: 150/76 Pulse: 72 O2 95% on room air.   Physical Exam: General: Alert, NAD.  Antalgic Gait  HEENT: EOMI, Good Neck Extension  Pulm: No increased work of breathing.  Clear B/L A/P w/o crackle or wheeze.  CV: RRR, No m/g/r appreciated  GI: soft, NT, ND. BS x 4 quadrants Neuro: CN II-XII grossly intact without focal deficit.  Sensation intact distally Skin: No lesions in the area of chief complaint MSK/Surgical Site: Medial JLT. ROM 10-110. Decreased strength in extension and flexion.  +EHL/FHL.  NVI. Pain with varus and valgus stress.    Imaging Review Plain radiographs demonstrate severe degenerative joint disease of the left knee.   The overall alignment ismild varus. The bone quality appears to be fair for age and reported activity level.  Preoperative templating of the joint replacement has been completed, documented, and submitted to the Operating  Room personnel in order to optimize intra-operative equipment management.  Assessment: OA LEFT KNEE Active Problems:   * No active hospital problems. *  Plan: Plan for Procedure(s): TOTAL KNEE ARTHROPLASTY  The patient history, physical exam, clinical judgement of the provider and imaging are consistent with end stage degenerative joint disease and total joint arthroplasty is deemed medically necessary. The treatment options including medical management, injection therapy, and arthroplasty were discussed at length. The risks and benefits of Procedure(s): TOTAL KNEE ARTHROPLASTY were presented and reviewed.  The risks of nonoperative treatment, versus surgical intervention including but not limited to continued pain, aseptic loosening, stiffness, dislocation/subluxation, infection, bleeding, nerve injury, blood clots, cardiopulmonary complications, morbidity, mortality, among others were discussed. The patient verbalizes understanding and wishes to proceed with the plan.  Patient is being admitted for inpatient treatment for surgery, pain control, PT, prophylactic antibiotics, VTE prophylaxis, progressive ambulation, ADL's and discharge planning. She will spend the night in observation.  Dental prophylaxis discussed and recommended for 2 years postoperatively.  The patient does meet the criteria for TXA which will be used perioperatively.   ASA 81 mg BID will be used postoperatively for DVT prophylaxis in addition to SCDs, and early ambulation. Plan for short course of Dilaudid, Tylenol, Celebrex for pain. The rest of her narcotic refills will be oxycodone. Robaxin for muscle spasms.   Zofran for nausea and vomiting. Pharmacy- [facility] in [place], [place] The patient is planning to be discharged home with OPPT and into the care of her husband Jeneen Rinks who can be reached at [phone] Follow up appt 01/31/22 at 4:15pm     Alisa Graff Office [id number] 01/04/2022 8:10  AM

## 2022-01-09 NOTE — Progress Notes (Signed)
Anesthesia Chart Review   Case: Brenda Morgan Date/Time: 01/16/22 0715   Procedure: TOTAL KNEE ARTHROPLASTY (Left: Knee)   Anesthesia type: Choice   Pre-op diagnosis: OA LEFT KNEE   Location: [facility] ROOM 08 / [facility]   Surgeons: Renette Butters, MD       DISCUSSION:66 y.o. former smoker with h/o CKD, COPD, DM II, HTN, DM II, PVD, seizures, left knee OA scheduled for above procedure 01/16/2022 with Dr. Edmonia Lynch.   Pt seen by cardiology 08/01/2021. Per OV note, "Stable from the cardiovascular standpoint, no angina, blood pressure is well control, no evidence of heart failure, she is considered low risk, may proceed."  Clearance from neurology received which states pt is optimized for surgery. Take Keppra DOS.   Pt followed by pulmonology for COPD.  Last seen 11/06/2021. Per OV note, "She is cleared for surgery as long as she is not having any exacerbation. Risk of decompensation is minimal as long as not having any acute exacerbation prior to surgery."  Posterior spinal fusion L4-5, hardware in place. Spinal cord stimulator in place, pt advised to bring remote DOS.   Anticipate pt can proceed with planned procedure barring acute status change.   VS: BP (!) 136/58    Pulse 66    Temp 36.6 C (Oral)    Resp 16    Ht 5\' 6"  (1.676 m)    Wt 95.9 kg    SpO2 100%    BMI 34.14 kg/m   PROVIDERS: Medicine, [facility]  Delanna Notice, MD is Cardiologist  LABS: Labs reviewed: Acceptable for surgery. (all labs ordered are listed, but only abnormal results are displayed)  Labs Reviewed  BASIC METABOLIC PANEL - Abnormal; Notable for the following components:      Result Value   Glucose, Bld 112 (*)    BUN 30 (*)    Creatinine, Ser 1.29 (*)    GFR, Estimated 46 (*)    Anion gap 4 (*)    All other components within normal limits  HEMOGLOBIN A1C - Abnormal; Notable for the following components:   Hgb A1c MFr Bld 5.8 (*)    All other components within normal limits  GLUCOSE, CAPILLARY -  Abnormal; Notable for the following components:   Glucose-Capillary 107 (*)    All other components within normal limits  SURGICAL PCR SCREEN  CBC  GLUCOSE, CAPILLARY     IMAGES:   EKG: 09/16/2021 Rate 63 bpm  Sinus rhythm   CV: Echo 11/18/2018 Ejection fraction [id number], grade 1 diastolic dysfunction aortic valve sclerosis mild TR Past Medical History:  Diagnosis Date   Anxiety    Arthritis    CHF (congestive heart failure) (HCC)    COPD (chronic obstructive pulmonary disease) ([place])    Diabetes mellitus without complication ([place])    Fatty liver 2012   per pt 06/24/20   GERD (gastroesophageal reflux disease)    Heart murmur    Hypertension    Peripheral vascular disease ([place])    Seizures ([place])    Sleep apnea     Past Surgical History:  Procedure Laterality Date   BACK SURGERY     carotid artery surgery Right 10/07/2012   carotid ultrasound  06/2015   CHOLECYSTECTOMY  1981   COLONOSCOPY  2014   DIAGNOSTIC MAMMOGRAM  03/2017   groin lypnoid Left 03/2015   LUMBAR WOUND DEBRIDEMENT N/A 08/02/2020   Procedure: IRRIGATE AND DEBRIDEMENT OF LUMBAR WOUND, PLACEMENT OF WOUND VAC;  Surgeon: Karsten Ro, DO;  Location:  [facility];  Service: Neurosurgery;  Laterality: N/A;   right heel spur  1994   SPINAL CORD STIMULATOR INSERTION N/A 05/03/2021   Procedure: Spinal Cord Stimulator Placement;  Surgeon: Reece Agar, MD;  Location: [facility];  Service: Neurosurgery;  Laterality: N/A;   TUBAL LIGATION  1985   wrist trigger release Right 01/2016    MEDICATIONS:  acetaminophen (TYLENOL) 500 MG tablet   atorvastatin (LIPITOR) 10 MG tablet   azelastine (ASTELIN) 0.1 % nasal spray   baclofen (LIORESAL) 20 MG tablet   carvedilol (COREG) 25 MG tablet   cetirizine (ZYRTEC) 10 MG tablet   COLLAGEN PO   Continuous Blood Gluc Sensor (FREESTYLE LIBRE 14 DAY SENSOR) MISC   Continuous Blood Gluc Sensor (FREESTYLE LIBRE 14 DAY SENSOR) MISC   diclofenac Sodium (VOLTAREN) 1 % GEL   doxycycline  (VIBRAMYCIN) 100 MG capsule   DULoxetine (CYMBALTA) 30 MG capsule   Empagliflozin-metFORMIN HCl ER (SYNJARDY XR) 25-1000 MG TB24   famotidine (PEPCID) 40 MG tablet   fentaNYL (DURAGESIC) 50 MCG/HR   ferrous sulfate 325 (65 FE) MG tablet   Fluticasone-Salmeterol (ADVAIR) 250-50 MCG/DOSE AEPB   furosemide (LASIX) 40 MG tablet   hydrOXYzine (ATARAX/VISTARIL) 25 MG tablet   icosapent Ethyl (VASCEPA) 1 g capsule   insulin aspart protamine - aspart (NOVOLOG MIX 70/30 FLEXPEN) (70-30) 100 UNIT/ML FlexPen   lamoTRIgine (LAMICTAL) 150 MG tablet   lamoTRIgine (LAMICTAL) 25 MG tablet   levETIRAcetam (KEPPRA) 500 MG tablet   losartan (COZAAR) 25 MG tablet   lubiprostone (AMITIZA) 24 MCG capsule   magnesium oxide (MAG-OX) 400 MG tablet   metolazone (ZAROXOLYN) 5 MG tablet   oxyCODONE-acetaminophen (PERCOCET) 10-325 MG tablet   OZEMPIC, 1 MG/DOSE, 4 MG/3ML SOPN   pantoprazole (PROTONIX) 40 MG tablet   pioglitazone (ACTOS) 15 MG tablet   potassium chloride SA (KLOR-CON) 20 MEQ tablet   pramipexole (MIRAPEX) 0.5 MG tablet   pregabalin (LYRICA) 200 MG capsule   spironolactone (ALDACTONE) 50 MG tablet   traZODone (DESYREL) 100 MG tablet   No current facility-administered medications for this encounter.    Konrad Felix Ward, PA-C [facility] [phone]

## 2022-01-09 NOTE — Anesthesia Preprocedure Evaluation (Addendum)
Anesthesia Evaluation  Patient identified by MRN, date of birth, ID band Patient awake    Reviewed: Allergy & Precautions, NPO status , Patient's Chart, lab work & pertinent test results, reviewed documented beta blocker date and time   History of Anesthesia Complications Negative for: history of anesthetic complications  Airway Mallampati: II  TM Distance: >3 FB Neck ROM: Full    Dental  (+) Edentulous Upper, Edentulous Lower   Pulmonary sleep apnea and Oxygen sleep apnea , COPD,  COPD inhaler and oxygen dependent, former smoker,  01/12/2022 SARS coronavirus NEG   breath sounds clear to auscultation       Cardiovascular hypertension, Pt. on medications and Pt. on home beta blockers (-) angina+ Peripheral Vascular Disease   Rhythm:Regular Rate:Normal  '18 ECHO: EF 60-65%, normal LVF, Grade 1 DD, no significant valvular abnormalities   Neuro/Psych Seizures -, Well Controlled,  Anxiety    GI/Hepatic Neg liver ROS, GERD  Medicated and Controlled,  Endo/Other  diabetes (glu 125), Oral Hypoglycemic Agents, Insulin Dependent  Renal/GU Renal InsufficiencyRenal disease     Musculoskeletal   Abdominal (+) + obese,   Peds  Hematology negative hematology ROS (+)   Anesthesia Other Findings   Reproductive/Obstetrics                           Anesthesia Physical Anesthesia Plan  ASA: 3  Anesthesia Plan: Spinal   Post-op Pain Management: Tylenol PO (pre-op) and Regional block   Induction: Intravenous  PONV Risk Score and Plan: 2 and Ondansetron and Treatment may vary due to age or medical condition  Airway Management Planned: Natural Airway and Simple Face Mask  Additional Equipment: None  Intra-op Plan:   Post-operative Plan:   Informed Consent: I have reviewed the patients History and Physical, chart, labs and discussed the procedure including the risks, benefits and alternatives for the  proposed anesthesia with the patient or authorized representative who has indicated his/her understanding and acceptance.       Plan Discussed with: CRNA and Surgeon  Anesthesia Plan Comments: (See PAT note 01/03/2022, Konrad Felix Ward, PA-C  Plan routine monitors, SAB with adductor canal block for post op analgesia)      Anesthesia Quick Evaluation

## 2022-01-12 ENCOUNTER — Encounter (HOSPITAL_COMMUNITY)
Admission: RE | Admit: 2022-01-12 | Discharge: 2022-01-12 | Disposition: A | Discharge status: Home / Self Care | Payer: BC Managed Care – PPO | Source: Ambulatory Visit | Attending: Orthopedic Surgery | Admitting: Orthopedic Surgery

## 2022-01-12 ENCOUNTER — Other Ambulatory Visit: Payer: Self-pay

## 2022-01-12 DIAGNOSIS — Z01812 Encounter for preprocedural laboratory examination: Secondary | ICD-10-CM | POA: Insufficient documentation

## 2022-01-12 DIAGNOSIS — Z20822 Contact with and (suspected) exposure to covid-19: Secondary | ICD-10-CM | POA: Diagnosis not present

## 2022-01-12 DIAGNOSIS — Z01818 Encounter for other preprocedural examination: Secondary | ICD-10-CM

## 2022-01-12 LAB — SARS CORONAVIRUS 2 (TAT 6-24 HRS): SARS Coronavirus 2: NEGATIVE

## 2022-01-16 ENCOUNTER — Other Ambulatory Visit: Payer: Self-pay

## 2022-01-16 ENCOUNTER — Encounter (HOSPITAL_COMMUNITY)
Admission: RE | Disposition: A | Discharge status: Home / Self Care | Payer: Self-pay | Source: Ambulatory Visit | Attending: Orthopedic Surgery

## 2022-01-16 ENCOUNTER — Ambulatory Visit (HOSPITAL_COMMUNITY): Payer: BC Managed Care – PPO | Admitting: Anesthesiology

## 2022-01-16 ENCOUNTER — Observation Stay (HOSPITAL_COMMUNITY)
Admission: RE | Admit: 2022-01-16 | Discharge: 2022-01-17 | Disposition: A | Discharge status: Home / Self Care | Payer: BC Managed Care – PPO | Source: Ambulatory Visit | Attending: Orthopedic Surgery | Admitting: Orthopedic Surgery

## 2022-01-16 ENCOUNTER — Observation Stay (HOSPITAL_COMMUNITY): Payer: BC Managed Care – PPO

## 2022-01-16 ENCOUNTER — Encounter (HOSPITAL_COMMUNITY): Payer: Self-pay | Admitting: Orthopedic Surgery

## 2022-01-16 ENCOUNTER — Ambulatory Visit (HOSPITAL_COMMUNITY): Payer: BC Managed Care – PPO | Admitting: Physician Assistant

## 2022-01-16 DIAGNOSIS — M1712 Unilateral primary osteoarthritis, left knee: Principal | ICD-10-CM | POA: Insufficient documentation

## 2022-01-16 DIAGNOSIS — I10 Essential (primary) hypertension: Secondary | ICD-10-CM

## 2022-01-16 DIAGNOSIS — I509 Heart failure, unspecified: Secondary | ICD-10-CM | POA: Insufficient documentation

## 2022-01-16 DIAGNOSIS — Z96652 Presence of left artificial knee joint: Secondary | ICD-10-CM

## 2022-01-16 DIAGNOSIS — I110 Hypertensive heart disease with heart failure: Secondary | ICD-10-CM | POA: Diagnosis not present

## 2022-01-16 DIAGNOSIS — Z87891 Personal history of nicotine dependence: Secondary | ICD-10-CM | POA: Diagnosis not present

## 2022-01-16 DIAGNOSIS — Z01818 Encounter for other preprocedural examination: Secondary | ICD-10-CM

## 2022-01-16 DIAGNOSIS — Z79899 Other long term (current) drug therapy: Secondary | ICD-10-CM | POA: Diagnosis not present

## 2022-01-16 DIAGNOSIS — J449 Chronic obstructive pulmonary disease, unspecified: Secondary | ICD-10-CM | POA: Diagnosis not present

## 2022-01-16 DIAGNOSIS — Z981 Arthrodesis status: Secondary | ICD-10-CM

## 2022-01-16 HISTORY — PX: TOTAL KNEE ARTHROPLASTY: SHX125

## 2022-01-16 HISTORY — DX: Presence of left artificial knee joint: Z96.652

## 2022-01-16 LAB — CBC
HCT: 35.2 % — ABNORMAL LOW (ref 36.0–46.0)
Hemoglobin: 11.1 g/dL — ABNORMAL LOW (ref 12.0–15.0)
MCH: 29.1 pg (ref 26.0–34.0)
MCHC: 31.5 g/dL (ref 30.0–36.0)
MCV: 92.4 fL (ref 80.0–100.0)
Platelets: 65 10*3/uL — ABNORMAL LOW (ref 150–400)
RBC: 3.81 MIL/uL — ABNORMAL LOW (ref 3.87–5.11)
RDW: 12.9 % (ref 11.5–15.5)
WBC: 3.2 10*3/uL — ABNORMAL LOW (ref 4.0–10.5)
nRBC: 0 % (ref 0.0–0.2)

## 2022-01-16 LAB — GLUCOSE, CAPILLARY
Glucose-Capillary: 125 mg/dL — ABNORMAL HIGH (ref 70–99)
Glucose-Capillary: 108 mg/dL — ABNORMAL HIGH (ref 70–99)
Glucose-Capillary: 215 mg/dL — ABNORMAL HIGH (ref 70–99)
Glucose-Capillary: 147 mg/dL — ABNORMAL HIGH (ref 70–99)

## 2022-01-16 LAB — CREATININE, SERUM
Creatinine, Ser: 1.03 mg/dL — ABNORMAL HIGH (ref 0.44–1.00)
GFR, Estimated: >60 mL/min (ref >60)

## 2022-01-16 SURGERY — ARTHROPLASTY, KNEE, TOTAL
Anesthesia: Spinal | Site: Knee | Laterality: Left

## 2022-01-16 MED ORDER — BUPIVACAINE LIPOSOME 1.3 % IJ SUSP
INTRAMUSCULAR | Status: AC
  Filled 2022-01-16: qty 20

## 2022-01-16 MED ORDER — LAMOTRIGINE 25 MG PO TABS: 25.0000 mg | ORAL_TABLET | Freq: Every day | ORAL | Status: DC

## 2022-01-16 MED ORDER — TRANEXAMIC ACID-NACL 1000-0.7 MG/100ML-% IV SOLN
INTRAVENOUS | Status: AC
  Administered 2022-01-16: 12:00:00 1000 mg via INTRAVENOUS
  Filled 2022-01-16: qty 100

## 2022-01-16 MED ORDER — LAMOTRIGINE 25 MG PO TABS
175.0000 mg | ORAL_TABLET | Freq: Every day | ORAL | Status: DC
  Administered 2022-01-16: 23:00:00 175 mg via ORAL
  Filled 2022-01-16: qty 3

## 2022-01-16 MED ORDER — METOCLOPRAMIDE HCL 5 MG/ML IJ SOLN: 5.0000 mg | Freq: Three times a day (TID) | INTRAMUSCULAR | Status: DC | PRN

## 2022-01-16 MED ORDER — DEXAMETHASONE SODIUM PHOSPHATE 10 MG/ML IJ SOLN
8.0000 mg | Freq: Once | INTRAMUSCULAR | Status: AC
  Administered 2022-01-16: 08:00:00 8 mg via INTRAVENOUS

## 2022-01-16 MED ORDER — SPIRONOLACTONE 25 MG PO TABS
50.0000 mg | ORAL_TABLET | Freq: Every day | ORAL | Status: DC
  Administered 2022-01-16: 17:00:00 50 mg via ORAL
  Filled 2022-01-16 (×2): qty 2

## 2022-01-16 MED ORDER — DOXYCYCLINE HYCLATE 100 MG PO TABS
100.0000 mg | ORAL_TABLET | Freq: Two times a day (BID) | ORAL | Status: DC
  Administered 2022-01-16 – 2022-01-17 (×3): 100 mg via ORAL
  Filled 2022-01-16 (×4): qty 1

## 2022-01-16 MED ORDER — METOLAZONE 5 MG PO TABS
5.0000 mg | ORAL_TABLET | ORAL | Status: DC
  Filled 2022-01-16: qty 1

## 2022-01-16 MED ORDER — PROMETHAZINE HCL 25 MG/ML IJ SOLN: 6.2500 mg | INTRAMUSCULAR | Status: DC | PRN

## 2022-01-16 MED ORDER — POVIDONE-IODINE 10 % EX SWAB
2.0000 "application " | Freq: Once | CUTANEOUS | Status: AC
  Administered 2022-01-16: 2 via TOPICAL

## 2022-01-16 MED ORDER — BUPIVACAINE-EPINEPHRINE 0.25% -1:200000 IJ SOLN
INTRAMUSCULAR | Status: DC | PRN
  Administered 2022-01-16: 30 mL

## 2022-01-16 MED ORDER — POTASSIUM CHLORIDE CRYS ER 20 MEQ PO TBCR
20.0000 meq | EXTENDED_RELEASE_TABLET | Freq: Every day | ORAL | Status: DC
  Administered 2022-01-16 – 2022-01-17 (×2): 20 meq via ORAL
  Filled 2022-01-16 (×2): qty 1

## 2022-01-16 MED ORDER — METOCLOPRAMIDE HCL 5 MG PO TABS: 5.0000 mg | ORAL_TABLET | Freq: Three times a day (TID) | ORAL | Status: DC | PRN

## 2022-01-16 MED ORDER — MIDAZOLAM HCL 5 MG/5ML IJ SOLN
INTRAMUSCULAR | Status: DC | PRN
  Administered 2022-01-16 (×2): 1 mg via INTRAVENOUS

## 2022-01-16 MED ORDER — PHENOL 1.4 % MT LIQD: 1.0000 | OROMUCOSAL | Status: DC | PRN

## 2022-01-16 MED ORDER — METFORMIN HCL ER 500 MG PO TB24
1000.0000 mg | ORAL_TABLET | Freq: Every day | ORAL | Status: DC
  Administered 2022-01-16: 16:00:00 1000 mg via ORAL
  Filled 2022-01-16: qty 2

## 2022-01-16 MED ORDER — INSULIN ASPART PROT & ASPART (70-30 MIX) 100 UNIT/ML PEN: 100.0000 [IU] | PEN_INJECTOR | Freq: Every day | SUBCUTANEOUS | Status: DC | PRN

## 2022-01-16 MED ORDER — ONDANSETRON HCL 4 MG/2ML IJ SOLN
INTRAMUSCULAR | Status: DC | PRN
  Administered 2022-01-16: 4 mg via INTRAVENOUS

## 2022-01-16 MED ORDER — CARVEDILOL 25 MG PO TABS
25.0000 mg | ORAL_TABLET | Freq: Every day | ORAL | Status: DC
  Administered 2022-01-16: 16:00:00 25 mg via ORAL
  Filled 2022-01-16: qty 1

## 2022-01-16 MED ORDER — MEPERIDINE HCL 50 MG/ML IJ SOLN: 6.2500 mg | INTRAMUSCULAR | Status: DC | PRN

## 2022-01-16 MED ORDER — ONDANSETRON HCL 4 MG/2ML IJ SOLN
INTRAMUSCULAR | Status: AC
  Filled 2022-01-16: qty 2

## 2022-01-16 MED ORDER — ONDANSETRON HCL 4 MG PO TABS: 4.0000 mg | ORAL_TABLET | Freq: Four times a day (QID) | ORAL | Status: DC | PRN

## 2022-01-16 MED ORDER — METHOCARBAMOL 500 MG IVPB - SIMPLE MED
500.0000 mg | Freq: Four times a day (QID) | INTRAVENOUS | Status: DC | PRN
  Filled 2022-01-16: qty 50

## 2022-01-16 MED ORDER — OXYCODONE HCL 5 MG PO TABS
5.0000 mg | ORAL_TABLET | ORAL | Status: DC | PRN
  Administered 2022-01-16: 16:00:00 10 mg via ORAL
  Administered 2022-01-17: 17:00:00 5 mg via ORAL
  Filled 2022-01-16: qty 1
  Filled 2022-01-16: qty 2

## 2022-01-16 MED ORDER — TRANEXAMIC ACID-NACL 1000-0.7 MG/100ML-% IV SOLN
1000.0000 mg | INTRAVENOUS | Status: AC
  Administered 2022-01-16: 08:00:00 1000 mg via INTRAVENOUS
  Filled 2022-01-16: qty 100

## 2022-01-16 MED ORDER — OXYCODONE HCL 5 MG PO TABS
ORAL_TABLET | ORAL | Status: AC
  Administered 2022-01-16: 11:00:00 10 mg via ORAL
  Filled 2022-01-16: qty 2

## 2022-01-16 MED ORDER — CARVEDILOL 25 MG PO TABS
25.0000 mg | ORAL_TABLET | Freq: Once | ORAL | Status: AC
  Administered 2022-01-16: 07:00:00 25 mg via ORAL
  Filled 2022-01-16: qty 1

## 2022-01-16 MED ORDER — POVIDONE-IODINE 10 % EX SWAB: 2.0000 "application " | Freq: Once | CUTANEOUS | Status: DC

## 2022-01-16 MED ORDER — PRAMIPEXOLE DIHYDROCHLORIDE 0.25 MG PO TABS
1.0000 mg | ORAL_TABLET | Freq: Every day | ORAL | Status: DC
  Administered 2022-01-16: 23:00:00 1 mg via ORAL
  Filled 2022-01-16: qty 4

## 2022-01-16 MED ORDER — OXYCODONE HCL 5 MG PO TABS
10.0000 mg | ORAL_TABLET | ORAL | Status: DC | PRN
  Administered 2022-01-16 – 2022-01-17 (×3): 15 mg via ORAL
  Filled 2022-01-16 (×3): qty 3

## 2022-01-16 MED ORDER — PROPOFOL 1000 MG/100ML IV EMUL
INTRAVENOUS | Status: AC
  Filled 2022-01-16: qty 100

## 2022-01-16 MED ORDER — OXYCODONE HCL ER 10 MG PO T12A
10.0000 mg | EXTENDED_RELEASE_TABLET | Freq: Two times a day (BID) | ORAL | Status: DC
  Administered 2022-01-16 (×2): 10 mg via ORAL
  Filled 2022-01-16 (×6): qty 1

## 2022-01-16 MED ORDER — EPHEDRINE SULFATE-NACL 50-0.9 MG/10ML-% IV SOSY
PREFILLED_SYRINGE | INTRAVENOUS | Status: DC | PRN
  Administered 2022-01-16 (×4): 5 mg via INTRAVENOUS

## 2022-01-16 MED ORDER — DOCUSATE SODIUM 100 MG PO CAPS
100.0000 mg | ORAL_CAPSULE | Freq: Two times a day (BID) | ORAL | Status: DC
  Administered 2022-01-16 – 2022-01-17 (×2): 100 mg via ORAL
  Filled 2022-01-16 (×2): qty 1

## 2022-01-16 MED ORDER — DEXAMETHASONE SODIUM PHOSPHATE 10 MG/ML IJ SOLN
INTRAMUSCULAR | Status: AC
  Filled 2022-01-16: qty 1

## 2022-01-16 MED ORDER — DOXYCYCLINE HYCLATE 100 MG PO CAPS: 100.0000 mg | ORAL_CAPSULE | Freq: Two times a day (BID) | ORAL | Status: DC

## 2022-01-16 MED ORDER — BUPIVACAINE IN DEXTROSE 0.75-8.25 % IT SOLN
INTRATHECAL | Status: DC | PRN
  Administered 2022-01-16: 1.6 mL via INTRATHECAL

## 2022-01-16 MED ORDER — CHLORHEXIDINE GLUCONATE 0.12 % MT SOLN
15.0000 mL | Freq: Once | OROMUCOSAL | Status: AC
  Administered 2022-01-16: 06:00:00 15 mL via OROMUCOSAL

## 2022-01-16 MED ORDER — METHOCARBAMOL 500 MG IVPB - SIMPLE MED
INTRAVENOUS | Status: AC
  Administered 2022-01-16: 11:00:00 500 mg via INTRAVENOUS
  Filled 2022-01-16: qty 50

## 2022-01-16 MED ORDER — PREGABALIN 100 MG PO CAPS: 200.0000 mg | ORAL_CAPSULE | Freq: Three times a day (TID) | ORAL | Status: DC

## 2022-01-16 MED ORDER — LUBIPROSTONE 24 MCG PO CAPS
24.0000 ug | ORAL_CAPSULE | Freq: Every day | ORAL | Status: DC
  Administered 2022-01-16 – 2022-01-17 (×2): 24 ug via ORAL
  Filled 2022-01-16 (×2): qty 1

## 2022-01-16 MED ORDER — CEFAZOLIN SODIUM-DEXTROSE 2-4 GM/100ML-% IV SOLN
2.0000 g | Freq: Four times a day (QID) | INTRAVENOUS | Status: AC
  Administered 2022-01-16 (×2): 2 g via INTRAVENOUS
  Filled 2022-01-16 (×2): qty 100

## 2022-01-16 MED ORDER — FENTANYL CITRATE (PF) 100 MCG/2ML IJ SOLN
INTRAMUSCULAR | Status: AC
  Filled 2022-01-16: qty 2

## 2022-01-16 MED ORDER — PHENYLEPHRINE 40 MCG/ML (10ML) SYRINGE FOR IV PUSH (FOR BLOOD PRESSURE SUPPORT)
PREFILLED_SYRINGE | INTRAVENOUS | Status: DC | PRN
  Administered 2022-01-16 (×4): 80 ug via INTRAVENOUS

## 2022-01-16 MED ORDER — OXYCODONE HCL 5 MG PO TABS: 5.0000 mg | ORAL_TABLET | Freq: Once | ORAL | Status: DC | PRN

## 2022-01-16 MED ORDER — ALUM & MAG HYDROXIDE-SIMETH 200-200-20 MG/5ML PO SUSP: 30.0000 mL | ORAL | Status: DC | PRN

## 2022-01-16 MED ORDER — DULOXETINE HCL 30 MG PO CPEP
30.0000 mg | ORAL_CAPSULE | Freq: Every day | ORAL | Status: DC
  Administered 2022-01-17: 09:00:00 30 mg via ORAL
  Filled 2022-01-16: qty 1

## 2022-01-16 MED ORDER — PROPOFOL 500 MG/50ML IV EMUL
INTRAVENOUS | Status: DC | PRN
  Administered 2022-01-16: 100 ug/kg/min via INTRAVENOUS

## 2022-01-16 MED ORDER — FERROUS SULFATE 325 (65 FE) MG PO TABS
325.0000 mg | ORAL_TABLET | Freq: Every day | ORAL | Status: DC
  Administered 2022-01-17: 09:00:00 325 mg via ORAL
  Filled 2022-01-16: qty 1

## 2022-01-16 MED ORDER — SODIUM CHLORIDE 0.9% FLUSH
INTRAVENOUS | Status: DC | PRN
  Administered 2022-01-16: 30 mL

## 2022-01-16 MED ORDER — LACTATED RINGERS IV SOLN: INTRAVENOUS | Status: DC

## 2022-01-16 MED ORDER — BUPIVACAINE-EPINEPHRINE (PF) 0.25% -1:200000 IJ SOLN
INTRAMUSCULAR | Status: AC
  Filled 2022-01-16: qty 30

## 2022-01-16 MED ORDER — INSULIN ASPART PROT & ASPART (70-30 MIX) 100 UNIT/ML ~~LOC~~ SUSP
45.0000 [IU] | Freq: Every day | SUBCUTANEOUS | Status: DC
  Administered 2022-01-16: 17:00:00 45 [IU] via SUBCUTANEOUS
  Filled 2022-01-16: qty 10

## 2022-01-16 MED ORDER — PIOGLITAZONE HCL 15 MG PO TABS
15.0000 mg | ORAL_TABLET | Freq: Every day | ORAL | Status: DC
  Administered 2022-01-16 – 2022-01-17 (×2): 15 mg via ORAL
  Filled 2022-01-16 (×2): qty 1

## 2022-01-16 MED ORDER — ACETAMINOPHEN 500 MG PO TABS
1000.0000 mg | ORAL_TABLET | Freq: Once | ORAL | Status: DC
  Filled 2022-01-16: qty 2

## 2022-01-16 MED ORDER — ORAL CARE MOUTH RINSE: 15.0000 mL | Freq: Once | OROMUCOSAL | Status: AC

## 2022-01-16 MED ORDER — ACETAMINOPHEN 325 MG PO TABS
325.0000 mg | ORAL_TABLET | Freq: Four times a day (QID) | ORAL | Status: DC | PRN
  Administered 2022-01-17: 17:00:00 650 mg via ORAL
  Filled 2022-01-16: qty 2

## 2022-01-16 MED ORDER — MAGNESIUM OXIDE 400 MG PO TABS
400.0000 mg | ORAL_TABLET | Freq: Every day | ORAL | Status: DC
  Administered 2022-01-16: 23:00:00 400 mg via ORAL
  Filled 2022-01-16 (×3): qty 1

## 2022-01-16 MED ORDER — SODIUM CHLORIDE 0.9 % IR SOLN
Status: DC | PRN
  Administered 2022-01-16: 1000 mL

## 2022-01-16 MED ORDER — SODIUM CHLORIDE (PF) 0.9 % IJ SOLN
INTRAMUSCULAR | Status: AC
  Filled 2022-01-16: qty 30

## 2022-01-16 MED ORDER — MIDAZOLAM HCL 2 MG/2ML IJ SOLN
INTRAMUSCULAR | Status: AC
  Filled 2022-01-16: qty 2

## 2022-01-16 MED ORDER — LAMOTRIGINE 25 MG PO TABS: 150.0000 mg | ORAL_TABLET | Freq: Every day | ORAL | Status: DC

## 2022-01-16 MED ORDER — HYDROXYZINE HCL 25 MG PO TABS
25.0000 mg | ORAL_TABLET | Freq: Every day | ORAL | Status: DC
  Administered 2022-01-16: 23:00:00 25 mg via ORAL
  Filled 2022-01-16: qty 1

## 2022-01-16 MED ORDER — BUPIVACAINE LIPOSOME 1.3 % IJ SUSP
INTRAMUSCULAR | Status: DC | PRN
  Administered 2022-01-16: 20 mL

## 2022-01-16 MED ORDER — LORATADINE 10 MG PO TABS
10.0000 mg | ORAL_TABLET | Freq: Every day | ORAL | Status: DC
  Administered 2022-01-16 – 2022-01-17 (×2): 10 mg via ORAL
  Filled 2022-01-16 (×2): qty 1

## 2022-01-16 MED ORDER — TRAZODONE HCL 100 MG PO TABS
100.0000 mg | ORAL_TABLET | Freq: Every day | ORAL | Status: DC
  Administered 2022-01-16: 23:00:00 100 mg via ORAL
  Filled 2022-01-16: qty 1

## 2022-01-16 MED ORDER — POLYETHYLENE GLYCOL 3350 17 G PO PACK: 17.0000 g | PACK | Freq: Every day | ORAL | Status: DC | PRN

## 2022-01-16 MED ORDER — DIPHENHYDRAMINE HCL 12.5 MG/5ML PO ELIX: 12.5000 mg | ORAL_SOLUTION | ORAL | Status: DC | PRN

## 2022-01-16 MED ORDER — FUROSEMIDE 40 MG PO TABS
40.0000 mg | ORAL_TABLET | Freq: Every day | ORAL | Status: DC
  Administered 2022-01-16 – 2022-01-17 (×2): 40 mg via ORAL
  Filled 2022-01-16 (×2): qty 1

## 2022-01-16 MED ORDER — EMPAGLIFLOZIN 25 MG PO TABS
25.0000 mg | ORAL_TABLET | Freq: Every day | ORAL | Status: DC
  Administered 2022-01-16: 17:00:00 25 mg via ORAL
  Filled 2022-01-16 (×2): qty 1

## 2022-01-16 MED ORDER — EPHEDRINE 5 MG/ML INJ
INTRAVENOUS | Status: AC
  Filled 2022-01-16: qty 5

## 2022-01-16 MED ORDER — 0.9 % SODIUM CHLORIDE (POUR BTL) OPTIME
TOPICAL | Status: DC | PRN
  Administered 2022-01-16: 08:00:00 1000 mL

## 2022-01-16 MED ORDER — MIDAZOLAM HCL 2 MG/2ML IJ SOLN: 0.5000 mg | Freq: Once | INTRAMUSCULAR | Status: DC | PRN

## 2022-01-16 MED ORDER — ROPIVACAINE HCL 7.5 MG/ML IJ SOLN
INTRAMUSCULAR | Status: DC | PRN
  Administered 2022-01-16: 20 mL via PERINEURAL

## 2022-01-16 MED ORDER — BISACODYL 10 MG RE SUPP: 10.0000 mg | Freq: Every day | RECTAL | Status: DC | PRN

## 2022-01-16 MED ORDER — DEXAMETHASONE SODIUM PHOSPHATE 10 MG/ML IJ SOLN
10.0000 mg | Freq: Once | INTRAMUSCULAR | Status: AC
  Administered 2022-01-17: 09:00:00 10 mg via INTRAVENOUS
  Filled 2022-01-16: qty 1

## 2022-01-16 MED ORDER — MENTHOL 3 MG MT LOZG: 1.0000 | LOZENGE | OROMUCOSAL | Status: DC | PRN

## 2022-01-16 MED ORDER — PREGABALIN 100 MG PO CAPS
200.0000 mg | ORAL_CAPSULE | Freq: Three times a day (TID) | ORAL | Status: DC
  Administered 2022-01-16 – 2022-01-17 (×5): 200 mg via ORAL
  Filled 2022-01-16 (×4): qty 2

## 2022-01-16 MED ORDER — PANTOPRAZOLE SODIUM 40 MG PO TBEC
40.0000 mg | DELAYED_RELEASE_TABLET | Freq: Every day | ORAL | Status: DC
  Administered 2022-01-16 – 2022-01-17 (×2): 40 mg via ORAL
  Filled 2022-01-16 (×2): qty 1

## 2022-01-16 MED ORDER — CEFAZOLIN SODIUM-DEXTROSE 2-4 GM/100ML-% IV SOLN
2.0000 g | INTRAVENOUS | Status: AC
  Administered 2022-01-16: 08:00:00 2 g via INTRAVENOUS
  Filled 2022-01-16: qty 100

## 2022-01-16 MED ORDER — EMPAGLIFLOZIN-METFORMIN HCL ER 25-1000 MG PO TB24: 1.0000 | ORAL_TABLET | Freq: Every day | ORAL | Status: DC

## 2022-01-16 MED ORDER — OXYCODONE HCL 5 MG/5ML PO SOLN: 5.0000 mg | Freq: Once | ORAL | Status: DC | PRN

## 2022-01-16 MED ORDER — ATORVASTATIN CALCIUM 10 MG PO TABS
5.0000 mg | ORAL_TABLET | Freq: Every day | ORAL | Status: DC
  Administered 2022-01-16 – 2022-01-17 (×2): 5 mg via ORAL
  Filled 2022-01-16 (×2): qty 1

## 2022-01-16 MED ORDER — ENOXAPARIN SODIUM 30 MG/0.3ML IJ SOSY
30.0000 mg | PREFILLED_SYRINGE | INTRAMUSCULAR | Status: DC
  Administered 2022-01-17: 09:00:00 30 mg via SUBCUTANEOUS
  Filled 2022-01-16: qty 0.3

## 2022-01-16 MED ORDER — ICOSAPENT ETHYL 1 G PO CAPS
2.0000 g | ORAL_CAPSULE | Freq: Two times a day (BID) | ORAL | Status: DC
  Administered 2022-01-16 – 2022-01-17 (×2): 2 g via ORAL
  Filled 2022-01-16 (×3): qty 2

## 2022-01-16 MED ORDER — ACETAMINOPHEN 500 MG PO TABS
1000.0000 mg | ORAL_TABLET | Freq: Once | ORAL | Status: AC
  Administered 2022-01-16: 06:00:00 1000 mg via ORAL

## 2022-01-16 MED ORDER — PHENYLEPHRINE 40 MCG/ML (10ML) SYRINGE FOR IV PUSH (FOR BLOOD PRESSURE SUPPORT)
PREFILLED_SYRINGE | INTRAVENOUS | Status: AC
  Filled 2022-01-16: qty 10

## 2022-01-16 MED ORDER — ONDANSETRON HCL 4 MG/2ML IJ SOLN: 4.0000 mg | Freq: Four times a day (QID) | INTRAMUSCULAR | Status: DC | PRN

## 2022-01-16 MED ORDER — BUPIVACAINE LIPOSOME 1.3 % IJ SUSP
20.0000 mL | Freq: Once | INTRAMUSCULAR | Status: DC
Start: 2022-01-16 — End: 2022-01-16

## 2022-01-16 MED ORDER — METHOCARBAMOL 500 MG PO TABS
500.0000 mg | ORAL_TABLET | Freq: Four times a day (QID) | ORAL | Status: DC | PRN
  Administered 2022-01-17 (×2): 500 mg via ORAL
  Filled 2022-01-16 (×2): qty 1

## 2022-01-16 MED ORDER — FENTANYL CITRATE (PF) 100 MCG/2ML IJ SOLN
INTRAMUSCULAR | Status: DC | PRN
  Administered 2022-01-16 (×2): 50 ug via INTRAVENOUS

## 2022-01-16 MED ORDER — HYDROMORPHONE HCL 1 MG/ML IJ SOLN: 0.2500 mg | INTRAMUSCULAR | Status: DC | PRN

## 2022-01-16 MED ORDER — FAMOTIDINE 20 MG PO TABS
40.0000 mg | ORAL_TABLET | Freq: Every day | ORAL | Status: DC
  Administered 2022-01-16: 23:00:00 40 mg via ORAL
  Filled 2022-01-16: qty 2

## 2022-01-16 MED ORDER — LOSARTAN POTASSIUM 25 MG PO TABS
25.0000 mg | ORAL_TABLET | Freq: Every day | ORAL | Status: DC
  Administered 2022-01-16: 15:00:00 25 mg via ORAL
  Filled 2022-01-16: qty 1

## 2022-01-16 MED ORDER — ACETAMINOPHEN 500 MG PO TABS
1000.0000 mg | ORAL_TABLET | Freq: Four times a day (QID) | ORAL | Status: AC
  Administered 2022-01-16 – 2022-01-17 (×4): 1000 mg via ORAL
  Filled 2022-01-16 (×4): qty 2

## 2022-01-16 MED ORDER — WATER FOR IRRIGATION, STERILE IR SOLN
Status: DC | PRN
  Administered 2022-01-16: 2000 mL

## 2022-01-16 MED ORDER — TRAMADOL HCL 50 MG PO TABS
50.0000 mg | ORAL_TABLET | Freq: Four times a day (QID) | ORAL | Status: DC
  Administered 2022-01-16 – 2022-01-17 (×3): 50 mg via ORAL
  Filled 2022-01-16 (×3): qty 1

## 2022-01-16 MED ORDER — HYDROMORPHONE HCL 1 MG/ML IJ SOLN
0.5000 mg | INTRAMUSCULAR | Status: DC | PRN
  Administered 2022-01-17: 1 mg via INTRAVENOUS
  Filled 2022-01-16 (×2): qty 1

## 2022-01-16 MED ORDER — LEVETIRACETAM 500 MG PO TABS
500.0000 mg | ORAL_TABLET | Freq: Two times a day (BID) | ORAL | Status: DC
  Administered 2022-01-16 – 2022-01-17 (×2): 500 mg via ORAL
  Filled 2022-01-16 (×2): qty 1

## 2022-01-16 MED ORDER — TRANEXAMIC ACID-NACL 1000-0.7 MG/100ML-% IV SOLN: 1000.0000 mg | Freq: Once | INTRAVENOUS | Status: AC

## 2022-01-16 MED ORDER — PRAMIPEXOLE DIHYDROCHLORIDE 0.25 MG PO TABS: 1.0000 mg | ORAL_TABLET | Freq: Every day | ORAL | Status: DC

## 2022-01-16 SURGICAL SUPPLY — 54 items
BAG COUNTER SPONGE SURGICOUNT (BAG) IMPLANT
BAG SURGICOUNT SPONGE COUNTING (BAG)
BLADE HEX COATED 2.75 (ELECTRODE) ×3 IMPLANT
BLADE SAG 18X100X1.27 (BLADE) ×3 IMPLANT
BLADE SAGITTAL 25.0X1.37X90 (BLADE) ×2 IMPLANT
BLADE SAGITTAL 25.0X1.37X90MM (BLADE) ×1
BLADE SURG 15 STRL LF DISP TIS (BLADE) ×1 IMPLANT
BLADE SURG 15 STRL SS (BLADE) ×2
BLADE SURG SZ10 CARB STEEL (BLADE) ×6 IMPLANT
BNDG ELASTIC 6X10 VLCR STRL LF (GAUZE/BANDAGES/DRESSINGS) ×3 IMPLANT
BOWL SMART MIX CTS (DISPOSABLE) IMPLANT
CLOSURE STERI-STRIP 1/2X4 (GAUZE/BANDAGES/DRESSINGS) ×1
CLOSURE WOUND 1/2 X4 (GAUZE/BANDAGES/DRESSINGS) ×1
CLSR STERI-STRIP ANTIMIC 1/2X4 (GAUZE/BANDAGES/DRESSINGS) ×2 IMPLANT
COVER SURGICAL LIGHT HANDLE (MISCELLANEOUS) ×3 IMPLANT
CUFF TOURN SGL QUICK 34 (TOURNIQUET CUFF) ×2
CUFF TRNQT CYL 34X4.125X (TOURNIQUET CUFF) ×1 IMPLANT
DECANTER SPIKE VIAL GLASS SM (MISCELLANEOUS) ×3 IMPLANT
DRAPE INCISE IOBAN 66X45 STRL (DRAPES) ×9 IMPLANT
DRAPE U-SHAPE 47X51 STRL (DRAPES) ×3 IMPLANT
DRSG MEPILEX BORDER 4X12 (GAUZE/BANDAGES/DRESSINGS) ×3 IMPLANT
DURAPREP 26ML APPLICATOR (WOUND CARE) ×6 IMPLANT
FEMORAL RETAINING CRUC KNEE #4 (Knees) ×2 IMPLANT
GLOVE SRG 8 PF TXTR STRL LF DI (GLOVE) ×1 IMPLANT
GLOVE SURG ENC MOIS LTX SZ7.5 (GLOVE) ×3 IMPLANT
GLOVE SURG POLYISO LF SZ7.5 (GLOVE) ×3 IMPLANT
GLOVE SURG UNDER POLY LF SZ7.5 (GLOVE) ×3 IMPLANT
GLOVE SURG UNDER POLY LF SZ8 (GLOVE) ×2
GOWN STRL REUS W/TWL LRG LVL3 (GOWN DISPOSABLE) ×3 IMPLANT
GOWN STRL REUS W/TWL XL LVL3 (GOWN DISPOSABLE) ×3 IMPLANT
HANDPIECE INTERPULSE COAX TIP (DISPOSABLE) ×2
HOLDER FOLEY CATH W/STRAP (MISCELLANEOUS) IMPLANT
IMMOBILIZER KNEE 20 (SOFTGOODS) ×3
IMMOBILIZER KNEE 20 THIGH 36 (SOFTGOODS) IMPLANT
IMMOBILIZER KNEE 22 UNIV (SOFTGOODS) ×3 IMPLANT
INSERT TIB BEARING SZ 5 10 (Insert) ×2 IMPLANT
KNEE PATELLA ASYMMETRIC 10X32 (Knees) ×2 IMPLANT
KNEE TIBIAL COMPONENT SZ5 (Knees) ×2 IMPLANT
MANIFOLD NEPTUNE II (INSTRUMENTS) ×3 IMPLANT
NS IRRIG 1000ML POUR BTL (IV SOLUTION) ×3 IMPLANT
PACK TOTAL KNEE CUSTOM (KITS) ×3 IMPLANT
PROTECTOR NERVE ULNAR (MISCELLANEOUS) ×3 IMPLANT
SET HNDPC FAN SPRY TIP SCT (DISPOSABLE) ×1 IMPLANT
SPONGE T-LAP 18X18 ~~LOC~~+RFID (SPONGE) ×9 IMPLANT
STRIP CLOSURE SKIN 1/2X4 (GAUZE/BANDAGES/DRESSINGS) ×1 IMPLANT
SUT MNCRL AB 3-0 PS2 18 (SUTURE) ×3 IMPLANT
SUT VIC AB 0 CT1 36 (SUTURE) ×3 IMPLANT
SUT VIC AB 1 CT1 36 (SUTURE) ×6 IMPLANT
SUT VIC AB 2-0 CT1 27 (SUTURE) ×2
SUT VIC AB 2-0 CT1 TAPERPNT 27 (SUTURE) ×1 IMPLANT
TRAY FOLEY MTR SLVR 14FR STAT (SET/KITS/TRAYS/PACK) IMPLANT
TRAY FOLEY MTR SLVR 16FR STAT (SET/KITS/TRAYS/PACK) IMPLANT
TUBE SUCTION HIGH CAP CLEAR NV (SUCTIONS) ×3 IMPLANT
WRAP KNEE MAXI GEL POST OP (GAUZE/BANDAGES/DRESSINGS) ×3 IMPLANT

## 2022-01-16 NOTE — Transfer of Care (Signed)
Immediate Anesthesia Transfer of Care Note  Patient: Brenda Morgan  Procedure(s) Performed: TOTAL KNEE ARTHROPLASTY (Left: Knee)  Patient Location: PACU  Anesthesia Type:Regional and Spinal  Level of Consciousness: awake, alert  and oriented  Airway & Oxygen Therapy: Patient Spontanous Breathing and Patient connected to face mask oxygen  Post-op Assessment: Report given to RN and Post -op Vital signs reviewed and stable  Post vital signs: Reviewed and stable  Last Vitals:  Vitals Value Taken Time  BP 125/60 01/16/22 0931  Temp    Pulse 62 01/16/22 0932  Resp 10 01/16/22 0932  SpO2 100 % 01/16/22 0932  Vitals shown include unvalidated device data.  Last Pain:  Vitals:   01/16/22 0600  TempSrc: Oral  PainSc:          Complications: No notable events documented.

## 2022-01-16 NOTE — Progress Notes (Signed)
Orthopedic Tech Progress Note Patient Details:  Brenda Morgan 1956/09/12 [id number]  Patient ID: Brenda Morgan, female   DOB: March 08, 1956, 66 y.o.   MRN: [id number]  Brenda Morgan 01/16/2022, 2:27 PM Cpm removed from patient

## 2022-01-16 NOTE — Anesthesia Postprocedure Evaluation (Signed)
Anesthesia Post Note  Patient: Lexicographer  Procedure(s) Performed: TOTAL KNEE ARTHROPLASTY (Left: Knee)     Patient location during evaluation: PACU Anesthesia Type: Spinal Level of consciousness: awake and alert, patient cooperative and oriented Pain management: pain level controlled Vital Signs Assessment: post-procedure vital signs reviewed and stable Respiratory status: spontaneous breathing, nonlabored ventilation, patient connected to nasal cannula oxygen and respiratory function stable Cardiovascular status: blood pressure returned to baseline and stable Postop Assessment: no apparent nausea or vomiting and spinal receding Anesthetic complications: no   No notable events documented.  Last Vitals:  Vitals:   01/16/22 1000 01/16/22 1015  BP: 121/63 (!) 123/57  Pulse: 61 61  Resp: 14 13  Temp:    SpO2: 100% 99%    Last Pain:  Vitals:   01/16/22 1015  TempSrc:   PainSc: 0-No pain                 Malia Corsi,E. Swathi Dauphin

## 2022-01-16 NOTE — Interval H&P Note (Signed)
History and Physical Interval Note:  01/16/2022 7:21 AM  Brenda Morgan  has presented today for surgery, with the diagnosis of OA LEFT KNEE.  The various methods of treatment have been discussed with the patient and family. After consideration of risks, benefits and other options for treatment, the patient has consented to  Procedure(s): TOTAL KNEE ARTHROPLASTY (Left) as a surgical intervention.  The patient's history has been reviewed, patient examined, no change in status, stable for surgery.  I have reviewed the patient's chart and labs.  Questions were answered to the patient's satisfaction.     Renette Butters

## 2022-01-16 NOTE — Plan of Care (Signed)

## 2022-01-16 NOTE — Anesthesia Procedure Notes (Signed)
Anesthesia Regional Block: Adductor canal block   Pre-Anesthetic Checklist: , timeout performed,  Correct Patient, Correct Site, Correct Laterality,  Correct Procedure, Correct Position, site marked,  Risks and benefits discussed,  Surgical consent,  Pre-op evaluation,  At surgeon's request and post-op pain management  Laterality: Left and Lower  Prep: chloraprep       Needles:  Injection technique: Single-shot  Needle Type: Echogenic Needle     Needle Length: 9cm  Needle Gauge: 21     Additional Needles:   Procedures:,,,, ultrasound used (permanent image in chart),,    Narrative:  Start time: 01/16/2022 6:55 AM End time: 01/16/2022 7:01 AM Injection made incrementally with aspirations every 5 mL.  Performed by: Personally  Anesthesiologist: Jairo Ben, MD  Additional Notes: Pt identified in Holding room.  Monitors applied. Working IV access confirmed. Sterile prep L thigh.  #21ga ECHOgenic Arrow block needle into adductor canal with US guidance.  20cc 0.75% Ropivacaine injected incrementally after negative test dose.  Patient asymptomatic, VSS, no heme aspirated, tolerated well.   Sandford Craze, MD

## 2022-01-16 NOTE — Op Note (Signed)
DATE OF SURGERY:  01/16/2022 TIME: 8:38 AM  PATIENT NAME:  Brenda Morgan   AGE: 66 y.o.    PRE-OPERATIVE DIAGNOSIS:  OA LEFT KNEE  POST-OPERATIVE DIAGNOSIS:  Same  PROCEDURE:  Procedure(s): TOTAL KNEE ARTHROPLASTY   SURGEON:  Renette Butters, MD   ASSISTANT:  Aggie Moats, PA-C, he was present and scrubbed throughout the case, critical for completion in a timely fashion, and for retraction, instrumentation, and closure.    OPERATIVE IMPLANTS: [employer] Triathlon CR. Press fit knee  Femur size 4, Tibia size 5, Patella size 32 3-peg oval button, with a 10 mm polyethylene insert.   PREOPERATIVE INDICATIONS:  Brenda Morgan is a 66 y.o. year old female with end stage bone on bone degenerative arthritis of the knee who failed conservative treatment, including injections, antiinflammatories, activity modification, and assistive devices, and had significant impairment of their activities of daily living, and elected for Total Knee Arthroplasty.   The risks, benefits, and alternatives were discussed at length including but not limited to the risks of infection, bleeding, nerve injury, stiffness, blood clots, the need for revision surgery, cardiopulmonary complications, among others, and they were willing to proceed.   OPERATIVE DESCRIPTION:  The patient was brought to the operative room and placed in a supine position.  General anesthesia was administered.  IV antibiotics were given.  The lower extremity was prepped and draped in the usual sterile fashion.  Time out was performed.  The leg was elevated and exsanguinated and the tourniquet was inflated.  Anterior approach was performed.  The patella was everted and osteophytes were removed.  The anterior horn of the medial and lateral meniscus was removed.   The distal femur was opened with the drill and the intramedullary distal femoral cutting jig was utilized, set at 5 degrees resecting 9 mm off the distal femur.  Care was taken to  protect the collateral ligaments.  The distal femoral sizing jig was applied, taking care to avoid notching.  Then the 4-in-1 cutting jig was applied and the anterior and posterior femur was cut, along with the chamfer cuts.  All posterior osteophytes were removed.  The flexion gap was then measured and was symmetric with the extension gap.  Then the extramedullary tibial cutting jig was utilized making the appropriate cut using the anterior tibial crest as a reference building in appropriate posterior slope.  Care was taken during the cut to protect the medial and collateral ligaments.  The proximal tibia was removed along with the posterior horns of the menisci.  The PCL was sacrificed.    The extensor gap was measured and was approximately 18mm.    I completed the distal femoral preparation using the appropriate jig to prepare the box.  The patella was then measured, and cut with the saw.    The proximal tibia sized and prepared accordingly with the reamer and the punch, and then all components were trialed with the above sized poly insert.  The knee was found to have excellent balance and full motion.    The above named components were then impacted into place and Poly tibial piece and patella were inserted.  I was very happy with his stability and ROM  I performed a periarticular injection with marcaine and toradol  The knee was easily taken through a range of motion and the patella tracked well and the knee irrigated copiously and the parapatellar and subcutaneous tissue closed with vicryl, and monocryl with steri strips for the skin.  The incision was  dressed with sterile gauze and the tourniquet released and the patient was awakened and returned to the PACU in stable and satisfactory condition.  There were no complications.  Total tourniquet time was roughly 60 minutes.   POSTOPERATIVE PLAN: post op Abx, DVT px: SCD's, TED's, Early ambulation and chemical px

## 2022-01-16 NOTE — Evaluation (Signed)
Physical Therapy Evaluation Patient Details Name: Brenda Morgan MRN: [id number] DOB: 1956/08/24 Today's Date: 01/16/2022  History of Present Illness  Patient is 66 y.o. female s/p Lt TKA on 01/16/22 with PMH significant for OA, anxiety, DM, COPD, CHF, GERD, HTN, PVD, seizures, back surgery.   Clinical Impression  Brenda Morgan is a 66 y.o. female POD 0 s/p Lt TKA. Patient reports independence with mobility at baseline. Patient is now limited by functional impairments (see PT problem list below) and requires min assist for transfers and gait with RW. Patient was able to ambulate ~13 feet with RW and min assist. Patient instructed in exercise to facilitate circulation to manage edema. Patient will benefit from continued skilled PT interventions to address impairments and progress towards PLOF. Acute PT will follow to progress mobility and stair training in preparation for safe discharge home.        Recommendations for follow up therapy are one component of a multi-disciplinary discharge planning process, led by the attending physician.  Recommendations may be updated based on patient status, additional functional criteria and insurance authorization.  Follow Up Recommendations Follow physician's recommendations for discharge plan and follow up therapies    Assistance Recommended at Discharge Frequent or constant Supervision/Assistance  Patient can return home with the following       Equipment Recommendations None recommended by PT  Recommendations for Other Services       Functional Status Assessment Patient has had a recent decline in their functional status and demonstrates the ability to make significant improvements in function in a reasonable and predictable amount of time.     Precautions / Restrictions Precautions Precautions: Fall Restrictions Weight Bearing Restrictions: No LLE Weight Bearing: Weight bearing as tolerated      Mobility  Bed Mobility Overal bed  mobility: Needs Assistance Bed Mobility: Supine to Sit     Supine to sit: Min guard, HOB elevated     General bed mobility comments: pt taking extra time and using bed rails to pivot/raise trunk, no assist needed for Lt LE to EOB    Transfers Overall transfer level: Needs assistance Equipment used: Rolling walker (2 wheels) Transfers: Sit to/from Stand Sit to Stand: Min assist           General transfer comment: cues for technique to power up with bil UE's from EOB. no overt LOB noted.    Ambulation/Gait Ambulation/Gait assistance: Min assist Gait Distance (Feet): 13 Feet Assistive device: Rolling walker (2 wheels) Gait Pattern/deviations: Step-to pattern, Decreased stride length, Decreased stance time - left, Decreased weight shift to left Gait velocity: decr     General Gait Details: cues for step pattern and assist to steady with RW, pt maintained safe position of RW. Distance limited by pain.  Stairs            Wheelchair Mobility    Modified Rankin (Stroke Patients Only)       Balance Overall balance assessment: Needs assistance Sitting-balance support: Feet supported Sitting balance-Leahy Scale: Fair     Standing balance support: Reliant on assistive device for balance, During functional activity, Bilateral upper extremity supported Standing balance-Leahy Scale: Poor                               Pertinent Vitals/Pain Pain Assessment Pain Assessment: 0-10 Pain Score: 8  Pain Location: Lt knee Pain Descriptors / Indicators: Aching, Discomfort Pain Intervention(s): Limited activity within patient's tolerance, Monitored during session, Repositioned,  Patient requesting pain meds-RN notified, RN gave pain meds during session    Home Living Family/patient expects to be discharged to:: Private residence Living Arrangements: Spouse/significant other Available Help at Discharge: Family;Available 24 hours/day Type of Home: Mobile  home Home Access: Ramped entrance       Home Layout: One level Home Equipment: Agricultural consultant (2 wheels);Rollator (4 wheels);Cane - single point;BSC/3in1;Shower seat;Grab bars - toilet      Prior Function Prior Level of Function : Independent/Modified Independent             Mobility Comments: pt has been using tall rollator for gait for >1 year and was using RW prior to that.       Hand Dominance   Dominant Hand: Right    Extremity/Trunk Assessment   Upper Extremity Assessment Upper Extremity Assessment: Overall WFL for tasks assessed    Lower Extremity Assessment Lower Extremity Assessment: Generalized weakness;LLE deficits/detail LLE Deficits / Details: good quad activaiton but pt unable to complete SLR, no buckling in standing. LLE Sensation: WNL LLE Coordination: WNL    Cervical / Trunk Assessment Cervical / Trunk Assessment: Normal  Communication   Communication: No difficulties  Cognition Arousal/Alertness: Awake/alert Behavior During Therapy: WFL for tasks assessed/performed Overall Cognitive Status: Within Functional Limits for tasks assessed                                          General Comments      Exercises     Assessment/Plan    PT Assessment Patient needs continued PT services  PT Problem List Decreased strength;Decreased range of motion;Decreased activity tolerance;Decreased balance;Decreased mobility;Decreased knowledge of use of DME;Decreased knowledge of precautions;Pain       PT Treatment Interventions DME instruction;Gait training;Stair training;Functional mobility training;Therapeutic activities;Therapeutic exercise;Balance training;Patient/family education    PT Goals (Current goals can be found in the Care Plan section)  Acute Rehab PT Goals Patient Stated Goal: get home and get back to walking with [facility] PT Goal Formulation: With patient Time For Goal Achievement: 01/23/22 Potential to Achieve Goals:  Good    Frequency 7X/week     Co-evaluation               AM-PAC PT "6 Clicks" Mobility  Outcome Measure Help needed turning from your back to your side while in a flat bed without using bedrails?: A Little Help needed moving from lying on your back to sitting on the side of a flat bed without using bedrails?: A Little Help needed moving to and from a bed to a chair (including a wheelchair)?: A Little Help needed standing up from a chair using your arms (e.g., wheelchair or bedside chair)?: A Little Help needed to walk in hospital room?: A Little Help needed climbing 3-5 steps with a railing? : A Lot 6 Click Score: 17    End of Session Equipment Utilized During Treatment: Gait belt Activity Tolerance: Patient tolerated treatment well Patient left: in chair;with call bell/phone within reach;with family/visitor present;with nursing/sitter in room Nurse Communication: Mobility status PT Visit Diagnosis: Muscle weakness (generalized) (M62.81);Difficulty in walking, not elsewhere classified (R26.2)    Time: 2440-1027 PT Time Calculation (min) (ACUTE ONLY): 19 min   Charges:   PT Evaluation $PT Eval Low Complexity: 1 Low          Wynn Maudlin, DPT Acute Rehabilitation Services Office [phone] Pager [phone]   Fleet Contras  E Quinn-Brown 01/16/2022, 3:24 PM

## 2022-01-16 NOTE — Interval H&P Note (Signed)
History and Physical Interval Note: ° °01/16/2022 °7:21 AM ° °Brenda Morgan  has presented today for surgery, with the diagnosis of OA LEFT KNEE.  The various methods of treatment have been discussed with the patient and family. After consideration of risks, benefits and other options for treatment, the patient has consented to  Procedure(s): °TOTAL KNEE ARTHROPLASTY (Left) as a surgical intervention.  The patient's history has been reviewed, patient examined, no change in status, stable for surgery.  I have reviewed the patient's chart and labs.  Questions were answered to the patient's satisfaction.   ° ° °Tyvion Edmondson D Larrisa Cravey ° ° °

## 2022-01-16 NOTE — Anesthesia Procedure Notes (Signed)
Spinal  Patient location during procedure: OR End time: 01/16/2022 7:33 AM Reason for block: surgical anesthesia Staffing Performed: resident/CRNA  Resident/CRNA: Eviana Sibilia D, CRNA Preanesthetic Checklist Completed: patient identified, IV checked, site marked, risks and benefits discussed, surgical consent, monitors and equipment checked, pre-op evaluation and timeout performed Spinal Block Patient position: sitting Prep: DuraPrep Patient monitoring: heart rate, continuous pulse ox and blood pressure Approach: midline Location: L3-4 Injection technique: single-shot Needle Needle type: Spinocan  Needle gauge: 24 G Needle length: 9 cm Assessment Sensory level: T6 Events: CSF return

## 2022-01-16 NOTE — Progress Notes (Signed)
Orthopedic Tech Progress Note Patient Details:  Brenda Morgan 06-07-1956 [id number]  Patient ID: [name], female   DOB: 04-13-1956, 66 y.o.   MRN: [id number]  [name] 01/16/2022, 10:26 AM Cpm applied in pacu

## 2022-01-17 ENCOUNTER — Encounter (HOSPITAL_COMMUNITY): Payer: Self-pay | Admitting: Orthopedic Surgery

## 2022-01-17 DIAGNOSIS — M1712 Unilateral primary osteoarthritis, left knee: Secondary | ICD-10-CM | POA: Diagnosis not present

## 2022-01-17 LAB — GLUCOSE, CAPILLARY
Glucose-Capillary: 126 mg/dL — ABNORMAL HIGH (ref 70–99)
Glucose-Capillary: 168 mg/dL — ABNORMAL HIGH (ref 70–99)

## 2022-01-17 MED ORDER — HYDROMORPHONE HCL 2 MG PO TABS
2.0000 mg | ORAL_TABLET | Freq: Four times a day (QID) | ORAL | 0 refills | Status: DC | PRN
Start: 2022-01-17 — End: 2022-04-28

## 2022-01-17 MED ORDER — MAGNESIUM OXIDE -MG SUPPLEMENT 400 (240 MG) MG PO TABS
400.0000 mg | ORAL_TABLET | Freq: Every day | ORAL | Status: DC
  Administered 2022-01-17: 09:00:00 400 mg via ORAL
  Filled 2022-01-17: qty 1

## 2022-01-17 MED ORDER — METHOCARBAMOL 750 MG PO TABS: 750.0000 mg | ORAL_TABLET | Freq: Three times a day (TID) | ORAL | 0 refills | Status: DC | PRN

## 2022-01-17 MED ORDER — ONDANSETRON 4 MG PO TBDP: 4.0000 mg | ORAL_TABLET | Freq: Two times a day (BID) | ORAL | 0 refills | Status: DC | PRN

## 2022-01-17 MED ORDER — ACETAMINOPHEN 500 MG PO TABS: 1000.0000 mg | ORAL_TABLET | Freq: Four times a day (QID) | ORAL | 0 refills | Status: DC | PRN

## 2022-01-17 MED ORDER — ASPIRIN EC 81 MG PO TBEC: 81.0000 mg | DELAYED_RELEASE_TABLET | Freq: Two times a day (BID) | ORAL | 0 refills | Status: DC

## 2022-01-17 MED ORDER — CELECOXIB 200 MG PO CAPS: 200.0000 mg | ORAL_CAPSULE | Freq: Two times a day (BID) | ORAL | 0 refills | Status: AC

## 2022-01-17 NOTE — Plan of Care (Signed)
°  Problem: Activity: Goal: Range of joint motion will improve Outcome: Progressing   Problem: Pain Management: Goal: Pain level will decrease with appropriate interventions Outcome: Progressing   Problem: Education: Goal: Knowledge of General Education information will improve Description: Including pain rating scale, medication(s)/side effects and non-pharmacologic comfort measures Outcome: Progressing   Problem: Nutrition: Goal: Adequate nutrition will be maintained Outcome: Progressing   Problem: Safety: Goal: Ability to remain free from injury will improve Outcome: Progressing

## 2022-01-17 NOTE — Progress Notes (Signed)
Physical Therapy Treatment Patient Details Name: Brenda Morgan MRN: [id number] DOB: [birth year] Today's Date: 01/17/2022   History of Present Illness Patient is 66 y.o. female s/p Lt TKA on 01/16/22 with PMH significant for OA, anxiety, DM, COPD, CHF, GERD, HTN, PVD, seizures, back surgery.    PT Comments    Patient much more alert this session and pain well controlled. Pt progressed well with PM therapy session and advanced gait distance to ~160' with min guard/supervision and no LOB noted. Pt's husband present and provided safe guarding for pt with transfers/gait with cues/supervision from therapist. EOS reviewed HEP for ROM and circulation at home and addressed questions for discharge. Pt is at safe level for discharge home with assist from family. Acute PT will continue to progress during her stay.    Recommendations for follow up therapy are one component of a multi-disciplinary discharge planning process, led by the attending physician.  Recommendations may be updated based on patient status, additional functional criteria and insurance authorization.  Follow Up Recommendations  Follow physician's recommendations for discharge plan and follow up therapies     Assistance Recommended at Discharge Frequent or constant Supervision/Assistance  Patient can return home with the following     Equipment Recommendations  None recommended by PT    Recommendations for Other Services       Precautions / Restrictions Precautions Precautions: Fall Restrictions Weight Bearing Restrictions: No LLE Weight Bearing: Weight bearing as tolerated     Mobility  Bed Mobility Overal bed mobility: Needs Assistance Bed Mobility: Supine to Sit     Supine to sit: Min guard, HOB elevated     General bed mobility comments: pt OOB in recliner    Transfers Overall transfer level: Needs assistance Equipment used: Rolling walker (2 wheels) Transfers: Sit to/from Stand Sit to Stand: Min guard,  Supervision           General transfer comment: pt demonstrated good technique to rise from toilet at start of session. cues for safe reach back to recliner and to extend Lt knee when sitting. guarding for safety only.    Ambulation/Gait Ambulation/Gait assistance: Min guard, Supervision Gait Distance (Feet): 160 Feet Assistive device: Rolling walker (2 wheels) Gait Pattern/deviations: Step-to pattern, Decreased stride length, Decreased stance time - left, Decreased weight shift to left Gait velocity: decr     General Gait Details: pt demonstrated good RW management and safe step to pattern with no LOB or buckling at Lt knee. Pt's husband present and provided safe guarding during gait with supervision from therapist.   Stairs             Wheelchair Mobility    Modified Rankin (Stroke Patients Only)       Balance Overall balance assessment: Needs assistance Sitting-balance support: Feet supported Sitting balance-Leahy Scale: Good     Standing balance support: Reliant on assistive device for balance, During functional activity, Bilateral upper extremity supported Standing balance-Leahy Scale: Fair                              Cognition Arousal/Alertness: Awake/alert Behavior During Therapy: WFL for tasks assessed/performed Overall Cognitive Status: Within Functional Limits for tasks assessed                                 General Comments: much more alert and able to follow commnds/cues  Exercises Total Joint Exercises Ankle Circles/Pumps: AROM, Both, 10 reps, Seated Quad Sets: AROM, Left, 10 reps, Seated Short Arc Quad: AROM, Left, Seated, 5 reps Heel Slides: AROM, Left, 10 reps, Seated Hip ABduction/ADduction: AROM, Left, Seated, 5 reps Long Arc Quad: AROM, Left, 10 reps, Seated    General Comments        Pertinent Vitals/Pain Pain Assessment Pain Assessment: 0-10 Pain Score: 5  Pain Location: Lt knee Pain  Descriptors / Indicators: Aching, Discomfort Pain Intervention(s): Limited activity within patient's tolerance, Monitored during session, Repositioned    Home Living                          Prior Function            PT Goals (current goals can now be found in the care plan section) Acute Rehab PT Goals Patient Stated Goal: get home and get back to walking with [facility] PT Goal Formulation: With patient Time For Goal Achievement: 01/23/22 Potential to Achieve Goals: Good Progress towards PT goals: Progressing toward goals    Frequency    7X/week      PT Plan Current plan remains appropriate    Co-evaluation              AM-PAC PT "6 Clicks" Mobility   Outcome Measure  Help needed turning from your back to your side while in a flat bed without using bedrails?: A Little Help needed moving from lying on your back to sitting on the side of a flat bed without using bedrails?: A Little Help needed moving to and from a bed to a chair (including a wheelchair)?: A Little Help needed standing up from a chair using your arms (e.g., wheelchair or bedside chair)?: A Little Help needed to walk in hospital room?: A Little Help needed climbing 3-5 steps with a railing? : A Little 6 Click Score: 18    End of Session Equipment Utilized During Treatment: Gait belt Activity Tolerance: Patient tolerated treatment well Patient left: in chair;with call bell/phone within reach;with family/visitor present;with nursing/sitter in room Nurse Communication: Mobility status PT Visit Diagnosis: Muscle weakness (generalized) (M62.81);Difficulty in walking, not elsewhere classified (R26.2)     Time: 8250-5397 PT Time Calculation (min) (ACUTE ONLY): 28 min  Charges:  $Gait Training: 8-22 mins $Therapeutic Exercise: 8-22 mins                     Wynn Maudlin, DPT Acute Rehabilitation Services Office [phone] Pager [phone]    Anitra Lauth 01/17/2022, 2:20 PM

## 2022-01-17 NOTE — Progress Notes (Signed)
Physical Therapy Treatment Patient Details Name: Marche Hottenstein MRN: [id number] DOB: 18-Nov-1956 Today's Date: 01/17/2022   History of Present Illness Patient is 66 y.o. female s/p Lt TKA on 01/16/22 with PMH significant for OA, anxiety, DM, COPD, CHF, GERD, HTN, PVD, seizures, back surgery.    PT Comments    Patient limited today by lethargy that improved minimally with activity. Pt able to sit up to EOB with min guard for safety but requires min assist to power up with RW. Pt ambulated short distance and required cues for sequencing steps and RW position throughout. Limited by pain and fatigue during session. Pt repositioned in recliner to rest at EOS and drifting to sleep. Acute PT will continue to progress pt as able throughout her stay.    Recommendations for follow up therapy are one component of a multi-disciplinary discharge planning process, led by the attending physician.  Recommendations may be updated based on patient status, additional functional criteria and insurance authorization.  Follow Up Recommendations  Follow physician's recommendations for discharge plan and follow up therapies     Assistance Recommended at Discharge Frequent or constant Supervision/Assistance  Patient can return home with the following     Equipment Recommendations  None recommended by PT    Recommendations for Other Services       Precautions / Restrictions Precautions Precautions: Fall Restrictions Weight Bearing Restrictions: No LLE Weight Bearing: Weight bearing as tolerated     Mobility  Bed Mobility Overal bed mobility: Needs Assistance Bed Mobility: Supine to Sit     Supine to sit: Min guard, HOB elevated     General bed mobility comments: pt groggy at start of session, taking extra time to sit up to EOB, pt able to bring LE's off side without assist and use bed rail to scoot.    Transfers Overall transfer level: Needs assistance Equipment used: Rolling walker (2  wheels) Transfers: Sit to/from Stand Sit to Stand: Min assist           General transfer comment: cues for technique to power up with bil UE's from EOB. assist needed to initiate and complete rise. cues needed to reach back and sit in recliner when pt reported dizziness.    Ambulation/Gait Ambulation/Gait assistance: Min assist Gait Distance (Feet): 15 Feet Assistive device: Rolling walker (2 wheels) Gait Pattern/deviations: Step-to pattern, Decreased stride length, Decreased stance time - left, Decreased weight shift to left Gait velocity: decr     General Gait Details: assist for progression of RW and cues for each step to ambulate with step to pattern. pt remained lethargic during mobility and c/o some dizziness. BP stable with no drop but remians soft throughout (108/49 mmHg, 110/44 mmhg) HR in 60's-70's.   Stairs             Wheelchair Mobility    Modified Rankin (Stroke Patients Only)       Balance Overall balance assessment: Needs assistance Sitting-balance support: Feet supported Sitting balance-Leahy Scale: Fair     Standing balance support: Reliant on assistive device for balance, During functional activity, Bilateral upper extremity supported Standing balance-Leahy Scale: Poor                              Cognition Arousal/Alertness: Awake/alert Behavior During Therapy: WFL for tasks assessed/performed Overall Cognitive Status: Within Functional Limits for tasks assessed  Exercises      General Comments        Pertinent Vitals/Pain Pain Assessment Pain Assessment: 0-10 Pain Score: 7  Pain Location: Lt knee Pain Descriptors / Indicators: Aching, Discomfort Pain Intervention(s): Limited activity within patient's tolerance, Monitored during session, Repositioned    Home Living                          Prior Function            PT Goals (current goals can  now be found in the care plan section) Acute Rehab PT Goals Patient Stated Goal: get home and get back to walking with [facility] PT Goal Formulation: With patient Time For Goal Achievement: 01/23/22 Potential to Achieve Goals: Good Progress towards PT goals: Progressing toward goals    Frequency    7X/week      PT Plan Current plan remains appropriate    Co-evaluation              AM-PAC PT "6 Clicks" Mobility   Outcome Measure  Help needed turning from your back to your side while in a flat bed without using bedrails?: A Little Help needed moving from lying on your back to sitting on the side of a flat bed without using bedrails?: A Little Help needed moving to and from a bed to a chair (including a wheelchair)?: A Little Help needed standing up from a chair using your arms (e.g., wheelchair or bedside chair)?: A Little Help needed to walk in hospital room?: A Little Help needed climbing 3-5 steps with a railing? : A Lot 6 Click Score: 17    End of Session Equipment Utilized During Treatment: Gait belt Activity Tolerance: Patient tolerated treatment well Patient left: in chair;with call bell/phone within reach;with family/visitor present;with nursing/sitter in room Nurse Communication: Mobility status PT Visit Diagnosis: Muscle weakness (generalized) (M62.81);Difficulty in walking, not elsewhere classified (R26.2)     Time: 1749-4496 PT Time Calculation (min) (ACUTE ONLY): 20 min  Charges:  $Gait Training: 8-22 mins                     Wynn Maudlin, DPT Acute Rehabilitation Services Office [phone] Pager [phone]    Anitra Lauth 01/17/2022, 11:51 AM

## 2022-01-17 NOTE — Discharge Summary (Signed)
Physician Discharge Summary  Patient ID: Brenda Morgan MRN: [id number] DOB/AGE: 02-01-1956 66 y.o.  Admit date: 01/16/2022 Discharge date: 01/17/2022  Admission Diagnoses: left knee OA  Discharge Diagnoses:  Principal Problem:   S/P total knee arthroplasty, left   Discharged Condition: fair  Hospital Course: Patient underwent a Left TKA with Dr. Percell Miller on [id number] without complications. She spent the night in observation for pain control and mobilization. She has passed her PT evaluation and is ready to go home.  Consults: None  Significant Diagnostic Studies: n/a  Treatments: IV hydration, antibiotics: Ancef, analgesia: acetaminophen, Dilaudid, Morphine, Tramadol, and Oxycodone, anticoagulation: Lovenox, and surgery: Left TKA  Discharge Exam: Blood pressure (!) 123/59, pulse 74, temperature 97.9 F (36.6 C), temperature source Oral, resp. rate 17, height 5\' 6"  (1.676 m), weight 95.9 kg, SpO2 90 %. General appearance: alert, cooperative, and no distress Head: Normocephalic, without obvious abnormality, atraumatic Eyes: conjunctivae/corneas clear. PERRL, EOM's intact. Fundi benign. Resp: clear to auscultation bilaterally Cardio: regular rate and rhythm, S1, S2 normal, no murmur, click, rub or gallop GI: soft, non-tender; bowel sounds normal; no masses,  no organomegaly Extremities: extremities normal, atraumatic, no cyanosis or edema Pulses:  L brachial 2+ R brachial 2+  L radial 2+ R radial 2+  L inguinal 2+ R inguinal 2+  L popliteal 2+ R popliteal 2+  L posterior tibial 2+ R posterior tibial 2+  L dorsalis pedis 2+ R dorsalis pedis 2+   Skin: Skin color, texture, turgor normal. No rashes or lesions Neurologic: Alert and oriented X 3, normal strength and tone. Normal symmetric reflexes. Normal coordination and gait Incision/Wound: c/d/i  Disposition: Discharge disposition: 01-Home or Self Care       Discharge Instructions     CPM   Complete by: As directed     Continuous passive motion machine (CPM):      Use the CPM from 0 to 90 degrees for 6 hours per day.       You may break it up into 2 or 3 sessions per day.      Use CPM for 3 weeks or until you are told to stop.   Call MD / Call 911   Complete by: As directed    If you experience chest pain or shortness of breath, CALL 911 and be transported to the hospital emergency room.  If you develope a fever above 101 F, pus (white drainage) or increased drainage or redness at the wound, or calf pain, call your surgeon's office.   Diet - low sodium heart healthy   Complete by: As directed    Discharge instructions   Complete by: As directed    You may bear weight as tolerated. Keep your dressing on and dry until follow up. Take medicine to prevent blood clots as directed. Take pain medicine as needed with the goal of transitioning to over the counter medicines.    INSTRUCTIONS AFTER JOINT REPLACEMENT   Remove items at home which could result in a fall. This includes throw rugs or furniture in walking pathways ICE to the affected joint every three hours while awake for 30 minutes at a time, for at least the first 3-5 days, and then as needed for pain and swelling.  Continue to use ice for pain and swelling. You may notice swelling that will progress down to the foot and ankle.  This is normal after surgery.  Elevate your leg when you are not up walking on it.   Continue to use the  breathing machine you got in the hospital (incentive spirometer) which will help keep your temperature down.  It is common for your temperature to cycle up and down following surgery, especially at night when you are not up moving around and exerting yourself.  The breathing machine keeps your lungs expanded and your temperature down.   DIET:  As you were doing prior to hospitalization, we recommend a well-balanced diet.  DRESSING / WOUND CARE / SHOWERING  You may shower 3 days after surgery, but keep the wounds dry during  showering.  You may use an occlusive plastic wrap (Press'n Seal for example) with blue painter's tape at edges, NO SOAKING/SUBMERGING IN THE BATHTUB.  If the bandage gets wet, call the office.   ACTIVITY  Increase activity slowly as tolerated, but follow the weight bearing instructions below.   No driving for 6 weeks or until further direction given by your physician.  You cannot drive while taking narcotics.  No lifting or carrying greater than 10 lbs. until further directed by your surgeon. Avoid periods of inactivity such as sitting longer than an hour when not asleep. This helps prevent blood clots.  You may return to work once you are authorized by your doctor.    WEIGHT BEARING   Weight bearing as tolerated with assist device (walker, cane, etc) as directed, use it as long as suggested by your surgeon or therapist, typically at least 4-6 weeks.   EXERCISES  Results after joint replacement surgery are often greatly improved when you follow the exercise, range of motion and muscle strengthening exercises prescribed by your doctor. Safety measures are also important to protect the joint from further injury. Any time any of these exercises cause you to have increased pain or swelling, decrease what you are doing until you are comfortable again and then slowly increase them. If you have problems or questions, call your caregiver or physical therapist for advice.   Rehabilitation is important following a joint replacement. After just a few days of immobilization, the muscles of the leg can become weakened and shrink (atrophy).  These exercises are designed to build up the tone and strength of the thigh and leg muscles and to improve motion. Often times heat used for twenty to thirty minutes before working out will loosen up your tissues and help with improving the range of motion but do not use heat for the first two weeks following surgery (sometimes heat can increase post-operative swelling).    These exercises can be done on a training (exercise) mat, on the floor, on a table or on a bed. Use whatever works the best and is most comfortable for you.    Use music or television while you are exercising so that the exercises are a pleasant break in your day. This will make your life better with the exercises acting as a break in your routine that you can look forward to.   Perform all exercises about fifteen times, three times per day or as directed.  You should exercise both the operative leg and the other leg as well.  Exercises include:   Quad Sets - Tighten up the muscle on the front of the thigh (Quad) and hold for 5-10 seconds.   Straight Leg Raises - With your knee straight (if you were given a brace, keep it on), lift the leg to 60 degrees, hold for 3 seconds, and slowly lower the leg.  Perform this exercise against resistance later as your leg gets stronger.  Leg Slides: Lying on your back, slowly slide your foot toward your buttocks, bending your knee up off the floor (only go as far as is comfortable). Then slowly slide your foot back down until your leg is flat on the floor again.  Angel Wings: Lying on your back spread your legs to the side as far apart as you can without causing discomfort.  Hamstring Strength:  Lying on your back, push your heel against the floor with your leg straight by tightening up the muscles of your buttocks.  Repeat, but this time bend your knee to a comfortable angle, and push your heel against the floor.  You may put a pillow under the heel to make it more comfortable if necessary.   A rehabilitation program following joint replacement surgery can speed recovery and prevent re-injury in the future due to weakened muscles. Contact your doctor or a physical therapist for more information on knee rehabilitation.    CONSTIPATION  Constipation is defined medically as fewer than three stools per week and severe constipation as less than one stool per week.   Even if you have a regular bowel pattern at home, your normal regimen is likely to be disrupted due to multiple reasons following surgery.  Combination of anesthesia, postoperative narcotics, change in appetite and fluid intake all can affect your bowels.   YOU MUST use at least one of the following options; they are listed in order of increasing strength to get the job done.  They are all available over the counter, and you may need to use some, POSSIBLY even all of these options:    Drink plenty of fluids (prune juice may be helpful) and high fiber foods Colace 100 mg by mouth twice a day  Senokot for constipation as directed and as needed Dulcolax (bisacodyl), take with full glass of water  Miralax (polyethylene glycol) once or twice a day as needed.  If you have tried all these things and are unable to have a bowel movement in the first 3-4 days after surgery call either your surgeon or your primary doctor.    If you experience loose stools or diarrhea, hold the medications until you stool forms back up.  If your symptoms do not get better within 1 week or if they get worse, check with your doctor.  If you experience "the worst abdominal pain ever" or develop nausea or vomiting, please contact the office immediately for further recommendations for treatment.   ITCHING:  If you experience itching with your medications, try taking only a single pain pill, or even half a pain pill at a time.  You can also use Benadryl over the counter for itching or also to help with sleep.   TED HOSE STOCKINGS:  Use stockings on both legs until for at least 2 weeks or as directed by physician office. They may be removed at night for sleeping.  MEDICATIONS:  See your medication summary on the "After Visit Summary" that nursing will review with you.  You may have some home medications which will be placed on hold until you complete the course of blood thinner medication.  It is important for you to complete the  blood thinner medication as prescribed.  Take medicines as prescribed.   You have several different medicines that work in different ways. - Tylenol is for mild to moderate pain. Try to take this medicine before turning to your narcotic medicines.  - Celebrex is to reduce pain / inflammation - Robaxin is  for muscle spasms. This medicine can make you drowsy. - Hydromorphone/Dilaudid is a narcotic pain medicine.  Take this for severe pain. This medicine can be dehydrating / constipating. - Zofran is for nausea and vomiting. - Aspirin is to prevent blood clots after surgery.   PRECAUTIONS:  If you experience chest pain or shortness of breath - call 911 immediately for transfer to the hospital emergency department.   If you develop a fever greater that 101 F, purulent drainage from wound, increased redness or drainage from wound, foul odor from the wound/dressing, or calf pain - CONTACT YOUR SURGEON.                                                   FOLLOW-UP APPOINTMENTS:  If you do not already have a post-op appointment, please call the office [phone] for an appointment to be seen by Dr. Percell Miller in 2 weeks.   OTHER INSTRUCTIONS:   MAKE SURE YOU:  Understand these instructions.  Get help right away if you are not doing well or get worse.    Thank you for letting us be a part of your medical care team.  It is a privilege we respect greatly.  We hope these instructions will help you stay on track for a fast and full recovery!   Do not put a pillow under the knee. Place it under the heel.   Complete by: As directed    Driving restrictions   Complete by: As directed    No driving for 2 weeks   Post-operative opioid taper instructions:   Complete by: As directed    POST-OPERATIVE OPIOID TAPER INSTRUCTIONS: It is important to wean off of your opioid medication as soon as possible. If you do not need pain medication after your surgery it is ok to stop day one. Opioids include: Codeine,  Hydrocodone(Norco, Vicodin), Oxycodone(Percocet, oxycontin) and hydromorphone amongst others.  Long term and even short term use of opiods can cause: Increased pain response Dependence Constipation Depression Respiratory depression And more.  Withdrawal symptoms can include Flu like symptoms Nausea, vomiting And more Techniques to manage these symptoms Hydrate well Eat regular healthy meals Stay active Use relaxation techniques(deep breathing, meditating, yoga) Do Not substitute Alcohol to help with tapering If you have been on opioids for less than two weeks and do not have pain than it is ok to stop all together.  Plan to wean off of opioids This plan should start within one week post op of your joint replacement. Maintain the same interval or time between taking each dose and first decrease the dose.  Cut the total daily intake of opioids by one tablet each day Next start to increase the time between doses. The last dose that should be eliminated is the evening dose.         Allergies as of 01/17/2022       Reactions   Amlodipine Anaphylaxis, Swelling, Other (See Comments)   Tongue swelling    Naltrexone Hives   Penicillins Hives, Other (See Comments)   Tolerated ceftriaxone and cefepime 06/2017 Hives   Penicillin G         Medication List     STOP taking these medications    baclofen 20 MG tablet Commonly known as: LIORESAL       TAKE these medications    acetaminophen  500 MG tablet Commonly known as: TYLENOL Take 1,000 mg by mouth every 6 (six) hours as needed for moderate pain, headache or fever. What changed: Another medication with the same name was added. Make sure you understand how and when to take each.   acetaminophen 500 MG tablet Commonly known as: TYLENOL Take 2 tablets (1,000 mg total) by mouth every 6 (six) hours as needed for mild pain or moderate pain. What changed: You were already taking a medication with the same name, and this  prescription was added. Make sure you understand how and when to take each.   aspirin EC 81 MG tablet Take 1 tablet (81 mg total) by mouth 2 (two) times daily. For DVT prophylaxis for 30 days after surgery.   atorvastatin 10 MG tablet Commonly known as: LIPITOR Take 5 mg by mouth daily.   azelastine 0.1 % nasal spray Commonly known as: ASTELIN Place 2 sprays into both nostrils 2 (two) times daily as needed for rhinitis (congestion).   carvedilol 25 MG tablet Commonly known as: COREG Take 25 mg by mouth daily.   celecoxib 200 MG capsule Commonly known as: CeleBREX Take 1 capsule (200 mg total) by mouth 2 (two) times daily for 14 days. For 2 weeks post op for pain and inflammation.  Discontinue Ibuprofen or other Anti-inflammatory medicine when taking this medicine.   cetirizine 10 MG tablet Commonly known as: ZYRTEC Take 10 mg by mouth daily.   COLLAGEN PO Take 1 tablet by mouth daily.   diclofenac Sodium 1 % Gel Commonly known as: VOLTAREN Apply 4 g topically 4 (four) times daily. What changed:  when to take this reasons to take this   doxycycline 100 MG capsule Commonly known as: VIBRAMYCIN TAKE 1 CAPSULE BY MOUTH TWICE A DAY   DULoxetine 30 MG capsule Commonly known as: CYMBALTA Take 30 mg by mouth daily.   famotidine 40 MG tablet Commonly known as: PEPCID Take 40 mg by mouth at bedtime.   fentaNYL 50 MCG/HR Commonly known as: DURAGESIC Place 1 patch onto the skin every 3 (three) days.   ferrous sulfate 325 (65 FE) MG tablet Take 325 mg by mouth daily with breakfast.   Fluticasone-Salmeterol 250-50 MCG/DOSE Aepb Commonly known as: ADVAIR Inhale 1 puff into the lungs 2 (two) times daily as needed (shortness of breath/wheezing).   FreeStyle Libre 14 Day Sensor Misc APPLY 1 SENSOR EVERY 14 DAYS AS DIRECTED   FreeStyle Libre 14 Day Sensor Misc Apply topically every 14 (fourteen) days.   furosemide 40 MG tablet Commonly known as: LASIX Take 40 mg by  mouth daily.   HYDROmorphone 2 MG tablet Commonly known as: Dilaudid Take 1 tablet (2 mg total) by mouth every 6 (six) hours as needed for severe pain (not relieved by Oxycodone).   hydrOXYzine 25 MG tablet Commonly known as: ATARAX Take 25 mg by mouth at bedtime.   icosapent Ethyl 1 g capsule Commonly known as: VASCEPA Take 2 g by mouth 2 (two) times daily.   lamoTRIgine 150 MG tablet Commonly known as: LAMICTAL Take 1 tablet (150 mg total) by mouth at bedtime. Take with 25 for a total of 175 mg at bedtime   lamoTRIgine 25 MG tablet Commonly known as: LAMICTAL Take 1 tablet (25 mg total) by mouth at bedtime. Take with 150 mg for a total of 175 mg at bedtime   levETIRAcetam 500 MG tablet Commonly known as: KEPPRA Take 1 tablet (500 mg total) by mouth 2 (two) times daily.  losartan 25 MG tablet Commonly known as: COZAAR Take 25 mg by mouth daily.   lubiprostone 24 MCG capsule Commonly known as: AMITIZA Take 24 mcg by mouth daily.   magnesium oxide 400 MG tablet Commonly known as: MAG-OX Take 400 mg by mouth daily.   methocarbamol 750 MG tablet Commonly known as: Robaxin-750 Take 1 tablet (750 mg total) by mouth every 8 (eight) hours as needed for muscle spasms.   metolazone 5 MG tablet Commonly known as: ZAROXOLYN Take 5 mg by mouth 3 (three) times a week. Sunday,Wednesday,friday   NovoLOG Mix 70/30 FlexPen (70-30) 100 UNIT/ML FlexPen Generic drug: insulin aspart protamine - aspart Inject into the skin daily as needed (High blood sugar). Sliding scale Per pt at preop appt pt takes once a day in the am if glucose if greater than 90.   ondansetron 4 MG disintegrating tablet Commonly known as: ZOFRAN-ODT Take 1 tablet (4 mg total) by mouth 2 (two) times daily as needed for nausea or vomiting.   oxyCODONE-acetaminophen 10-325 MG tablet Commonly known as: PERCOCET Take 1 tablet by mouth every 8 (eight) hours as needed for pain.   Ozempic (1 MG/DOSE) 4 MG/3ML  Sopn Generic drug: Semaglutide (1 MG/DOSE) Inject 1 mg into the skin once a week. Take on Sunday   pantoprazole 40 MG tablet Commonly known as: PROTONIX Take 40 mg by mouth daily.   pioglitazone 15 MG tablet Commonly known as: ACTOS Take 15 mg by mouth daily.   potassium chloride SA 20 MEQ tablet Commonly known as: KLOR-CON M Take 20 mEq by mouth daily.   pramipexole 0.5 MG tablet Commonly known as: MIRAPEX Take 1 mg by mouth at bedtime.   pregabalin 200 MG capsule Commonly known as: LYRICA Take 200 mg by mouth in the morning, at noon, and at bedtime.   spironolactone 50 MG tablet Commonly known as: ALDACTONE Take 50 mg by mouth daily.   Synjardy XR 25-1000 MG Tb24 Generic drug: Empagliflozin-metFORMIN HCl ER Take 1 tablet by mouth daily.   traZODone 100 MG tablet Commonly known as: DESYREL Take 100 mg by mouth at bedtime.        Follow-up Information     Renette Butters, MD. Go on 01/31/2022.   Specialty: Orthopedic Surgery Why: at 4:15pm Contact information: [address] [place] [address] [phone]                 Signed: Alisa Graff 01/17/2022, 4:28 PM

## 2022-01-17 NOTE — Discharge Instructions (Signed)

## 2022-01-17 NOTE — Progress Notes (Signed)
° ° °  Subjective: Patient reports pain as severe. Didn't sleep well. Tolerating diet.  Urinating.  No CP, SOB.  Worked with PT on mobilization OOB. Has been slightly limited due to hypotension and delirium. Holding BP and fluid meds. Trying to limit narcotics.  Objective:   VITALS:   Vitals:   01/17/22 0430 01/17/22 0842 01/17/22 0843 01/17/22 1024  BP: (!) 106/52 (!) 99/40 (!) 100/45 (!) 94/50  Pulse: 72 74 73 64  Resp: 16 16 16 16   Temp: 98.1 F (36.7 C)  97.7 F (36.5 C) 98.3 F (36.8 C)  TempSrc: Oral  Oral Oral  SpO2: 96% 90% 93% 96%  Weight:      Height:       CBC Latest Ref Rng & Units 01/16/2022 01/03/2022 08/31/2021  WBC 4.0 - 10.5 K/uL 3.2(L) 9.3 8.9  Hemoglobin 12.0 - 15.0 g/dL 11.1(L) 12.2 12.2  Hematocrit 36.0 - 46.0 % 35.2(L) 38.6 38.2  Platelets 150 - 400 K/uL 65(L) 181 226   BMP Latest Ref Rng & Units 01/16/2022 01/03/2022 08/31/2021  Glucose 70 - 99 mg/dL - 112(H) 124(H)  BUN 8 - 23 mg/dL - 30(H) 38(H)  Creatinine 0.44 - 1.00 mg/dL 1.03(H) 1.29(H) 1.14(H)  Sodium 135 - 145 mmol/L - 136 137  Potassium 3.5 - 5.1 mmol/L - 4.7 4.8  Chloride 98 - 111 mmol/L - 100 98  CO2 22 - 32 mmol/L - 32 31  Calcium 8.9 - 10.3 mg/dL - 9.1 9.4   Intake/Output      01/24 0701 01/25 0700 01/25 0701 01/26 0700   I.V. (mL/kg) 1750 (18.2)    IV Piggyback 185.5    Total Intake(mL/kg) 1935.5 (20.2)    Urine (mL/kg/hr) 3075 (1.3)    Blood 50    Total Output 3125    Net -1189.5            Physical Exam: General: NAD. Sitting up in bedside chair, shaky but calm Resp: No increased wob Cardio: regular rate and rhythm ABD soft Neurologically intact MSK Neurovascularly intact Sensation intact distally Intact pulses distally Dorsiflexion/Plantar flexion intact Incision: dressing C/D/I   Assessment: 1 Day Post-Op  S/P Procedure(s) (LRB): TOTAL KNEE ARTHROPLASTY (Left) by Dr. Ernesta Amble. Murphy on 01/16/22  Principal Problem:   S/P total knee arthroplasty,  left   Plan: Continue to monitor BP. If still low hold meds. Ok to give Lasix to avoid fluid overload  Advance diet Up with therapy Incentive Spirometry Elevate and Apply ice  Weightbearing: WBAT LLE Insicional and dressing care: Dressings left intact until follow-up and Reinforce dressings as needed Orthopedic device(s): None Showering: Keep dressing dry VTE prophylaxis: Aspirin 81mg  BID  x 30 days post-op , SCDs, ambulation Pain control: Tylenol, Tramadol, Oxy Follow - up plan: 2 weeks Contact information:  Edmonia Lynch MD, Aggie Moats PA-C  Dispo: Home hopefully later today if can control pain, stabilize BP, and mobilize better.     Britt Bottom, PA-C Office [phone] 01/17/2022, 11:30 AM

## 2022-01-17 NOTE — Progress Notes (Signed)
Provided discharge education/instructions to Pt and husband, all questions and concerns addressed. Pt not in acute distress, no lightheadedness, no dizziness, no n/v, states she is ready to go home. Discharged home with belongings accompanied by husband.

## 2022-01-17 NOTE — Plan of Care (Signed)

## 2022-01-17 NOTE — TOC Transition Note (Signed)
Transition of Care [facility]) - CM/SW Discharge Note   Patient Details  Name: Lashawn Bromwell MRN: [id number] Date of Birth: 01/12/1956  Transition of Care [facility]) CM/SW Contact:  Lennart Pall, LCSW Phone Number: 01/17/2022, 10:02 AM   Clinical Narrative:    Met with pt and spouse today and confirming she has all needed DME at home. Plan for OPPT at [facility]  No further TOC needs.   Final next level of care: OP Rehab Barriers to Discharge: No Barriers Identified   Patient Goals and CMS Choice Patient states their goals for this hospitalization and ongoing recovery are:: return home      Discharge Placement                       Discharge Plan and Services                DME Arranged: N/A DME Agency: NA                  Social Determinants of Health ([place]) Interventions     Readmission Risk Interventions Readmission Risk Prevention Plan 08/31/2021 08/05/2020  Transportation Screening Complete Complete  PCP or Specialist Appt within 3-5 Days Not Complete Complete  Not Complete comments first available appt 9/19 -  [place] or Home Care Consult Complete Complete  Social Work Consult for [place] Planning/Counseling Complete Complete  Palliative Care Screening Not Applicable Not Applicable  Medication Review Press photographer) - Complete

## 2022-02-08 ENCOUNTER — Ambulatory Visit: Payer: BC Managed Care – PPO | Admitting: Pulmonary Disease

## 2022-02-14 ENCOUNTER — Ambulatory Visit (HOSPITAL_COMMUNITY)
Admission: RE | Admit: 2022-02-14 | Discharge: 2022-02-14 | Disposition: A | Discharge status: Home / Self Care | Payer: BC Managed Care – PPO | Source: Ambulatory Visit | Attending: Orthopedic Surgery | Admitting: Orthopedic Surgery

## 2022-02-14 ENCOUNTER — Other Ambulatory Visit (HOSPITAL_COMMUNITY): Payer: Self-pay | Admitting: Orthopedic Surgery

## 2022-02-14 ENCOUNTER — Other Ambulatory Visit: Payer: Self-pay

## 2022-02-14 DIAGNOSIS — R600 Localized edema: Secondary | ICD-10-CM | POA: Diagnosis present

## 2022-02-14 DIAGNOSIS — M7989 Other specified soft tissue disorders: Secondary | ICD-10-CM | POA: Diagnosis not present

## 2022-02-14 DIAGNOSIS — M79605 Pain in left leg: Secondary | ICD-10-CM | POA: Diagnosis present

## 2022-02-14 NOTE — Progress Notes (Signed)
Lower extremity venous LT study completed.  Preliminary results relayed to [place], [place].  See CV Proc for preliminary results report.   Darlin Coco, RDMS, RVT

## 2022-03-01 ENCOUNTER — Telehealth: Payer: Self-pay

## 2022-03-01 NOTE — Telephone Encounter (Signed)
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  Family has relocated and would like to have her care here in [place].  Dr. Hilarie Fredrickson you are DOD.  Please review if okay to schedule.   ?

## 2022-03-01 NOTE — Telephone Encounter (Signed)
Patients husband notified of response.  Referral closed ?

## 2022-03-01 NOTE — Telephone Encounter (Signed)
The patient is well-established with [facility] and hepatology.  She has had extensive liver evaluation there as well as upper and lower endoscopies. ?For this reason my recommendation is that she follow-up with [facility] particularly in light of possible abnormal GI imaging ?

## 2022-03-20 ENCOUNTER — Ambulatory Visit: Payer: BC Managed Care – PPO | Admitting: Pulmonary Disease

## 2022-04-28 ENCOUNTER — Encounter (HOSPITAL_COMMUNITY): Payer: Self-pay | Admitting: Emergency Medicine

## 2022-04-28 ENCOUNTER — Emergency Department (HOSPITAL_COMMUNITY): Payer: BC Managed Care – PPO

## 2022-04-28 ENCOUNTER — Observation Stay (HOSPITAL_COMMUNITY)
Admission: EM | Admit: 2022-04-28 | Discharge: 2022-04-29 | Disposition: A | Discharge status: Home / Self Care | Payer: BC Managed Care – PPO | Attending: Family Medicine | Admitting: Family Medicine

## 2022-04-28 ENCOUNTER — Other Ambulatory Visit: Payer: Self-pay

## 2022-04-28 DIAGNOSIS — R4182 Altered mental status, unspecified: Principal | ICD-10-CM | POA: Insufficient documentation

## 2022-04-28 DIAGNOSIS — Z9689 Presence of other specified functional implants: Secondary | ICD-10-CM

## 2022-04-28 DIAGNOSIS — Z8616 Personal history of COVID-19: Secondary | ICD-10-CM | POA: Diagnosis not present

## 2022-04-28 DIAGNOSIS — Z79899 Other long term (current) drug therapy: Secondary | ICD-10-CM | POA: Diagnosis not present

## 2022-04-28 DIAGNOSIS — I5030 Unspecified diastolic (congestive) heart failure: Secondary | ICD-10-CM | POA: Insufficient documentation

## 2022-04-28 DIAGNOSIS — E1122 Type 2 diabetes mellitus with diabetic chronic kidney disease: Secondary | ICD-10-CM

## 2022-04-28 DIAGNOSIS — R300 Dysuria: Secondary | ICD-10-CM | POA: Insufficient documentation

## 2022-04-28 DIAGNOSIS — I152 Hypertension secondary to endocrine disorders: Secondary | ICD-10-CM | POA: Diagnosis present

## 2022-04-28 DIAGNOSIS — Z794 Long term (current) use of insulin: Secondary | ICD-10-CM | POA: Diagnosis not present

## 2022-04-28 DIAGNOSIS — E876 Hypokalemia: Secondary | ICD-10-CM | POA: Diagnosis present

## 2022-04-28 DIAGNOSIS — R2990 Unspecified symptoms and signs involving the nervous system: Secondary | ICD-10-CM

## 2022-04-28 DIAGNOSIS — T40411A Poisoning by fentanyl or fentanyl analogs, accidental (unintentional), initial encounter: Secondary | ICD-10-CM | POA: Diagnosis not present

## 2022-04-28 DIAGNOSIS — E669 Obesity, unspecified: Secondary | ICD-10-CM

## 2022-04-28 DIAGNOSIS — Z96652 Presence of left artificial knee joint: Secondary | ICD-10-CM | POA: Insufficient documentation

## 2022-04-28 DIAGNOSIS — E119 Type 2 diabetes mellitus without complications: Secondary | ICD-10-CM

## 2022-04-28 DIAGNOSIS — J449 Chronic obstructive pulmonary disease, unspecified: Secondary | ICD-10-CM | POA: Diagnosis present

## 2022-04-28 DIAGNOSIS — Z20822 Contact with and (suspected) exposure to covid-19: Secondary | ICD-10-CM | POA: Insufficient documentation

## 2022-04-28 DIAGNOSIS — G8929 Other chronic pain: Secondary | ICD-10-CM | POA: Diagnosis present

## 2022-04-28 DIAGNOSIS — Z87891 Personal history of nicotine dependence: Secondary | ICD-10-CM | POA: Diagnosis not present

## 2022-04-28 DIAGNOSIS — G929 Unspecified toxic encephalopathy: Secondary | ICD-10-CM | POA: Diagnosis present

## 2022-04-28 DIAGNOSIS — Z7982 Long term (current) use of aspirin: Secondary | ICD-10-CM | POA: Diagnosis not present

## 2022-04-28 DIAGNOSIS — I10 Essential (primary) hypertension: Secondary | ICD-10-CM | POA: Diagnosis present

## 2022-04-28 DIAGNOSIS — N189 Chronic kidney disease, unspecified: Secondary | ICD-10-CM | POA: Insufficient documentation

## 2022-04-28 DIAGNOSIS — Z7984 Long term (current) use of oral hypoglycemic drugs: Secondary | ICD-10-CM | POA: Insufficient documentation

## 2022-04-28 DIAGNOSIS — K7400 Hepatic fibrosis, unspecified: Secondary | ICD-10-CM | POA: Insufficient documentation

## 2022-04-28 DIAGNOSIS — I130 Hypertensive heart and chronic kidney disease with heart failure and stage 1 through stage 4 chronic kidney disease, or unspecified chronic kidney disease: Secondary | ICD-10-CM | POA: Diagnosis not present

## 2022-04-28 DIAGNOSIS — R410 Disorientation, unspecified: Secondary | ICD-10-CM | POA: Diagnosis present

## 2022-04-28 DIAGNOSIS — M797 Fibromyalgia: Secondary | ICD-10-CM | POA: Diagnosis present

## 2022-04-28 DIAGNOSIS — G40909 Epilepsy, unspecified, not intractable, without status epilepticus: Secondary | ICD-10-CM

## 2022-04-28 DIAGNOSIS — K760 Fatty (change of) liver, not elsewhere classified: Secondary | ICD-10-CM | POA: Insufficient documentation

## 2022-04-28 DIAGNOSIS — G319 Degenerative disease of nervous system, unspecified: Secondary | ICD-10-CM

## 2022-04-28 DIAGNOSIS — E66811 Obesity, class 1: Secondary | ICD-10-CM

## 2022-04-28 HISTORY — DX: Presence of other specified functional implants: Z96.89

## 2022-04-28 HISTORY — DX: Hepatic fibrosis, unspecified: K74.00

## 2022-04-28 HISTORY — DX: Altered mental status, unspecified: R41.82

## 2022-04-28 HISTORY — DX: Pneumonia due to coronavirus disease 2019: J12.82

## 2022-04-28 HISTORY — DX: Shock, unspecified: R57.9

## 2022-04-28 HISTORY — DX: Fatty (change of) liver, not elsewhere classified: K76.0

## 2022-04-28 HISTORY — DX: COVID-19: U07.1

## 2022-04-28 HISTORY — DX: Acute respiratory failure with hypoxia: J96.01

## 2022-04-28 HISTORY — DX: Acute respiratory failure with hypoxia: J96.02

## 2022-04-28 HISTORY — DX: Unspecified toxic encephalopathy: G92.9

## 2022-04-28 HISTORY — DX: Metabolic encephalopathy: G93.41

## 2022-04-28 LAB — URINALYSIS, ROUTINE W REFLEX MICROSCOPIC
Bacteria, UA: NONE SEEN
Bilirubin Urine: NEGATIVE
Glucose, UA: NEGATIVE mg/dL
Ketones, ur: 20 mg/dL — AB
Leukocytes,Ua: NEGATIVE
Nitrite: NEGATIVE
Protein, ur: >=300 mg/dL — AB
Specific Gravity, Urine: >1.046 — ABNORMAL HIGH (ref 1.005–1.030)
pH: 6 (ref 5.0–8.0)

## 2022-04-28 LAB — COMPREHENSIVE METABOLIC PANEL
ALT: 17 U/L (ref 0–44)
AST: 18 U/L (ref 15–41)
Albumin: 3.3 g/dL — ABNORMAL LOW (ref 3.5–5.0)
Alkaline Phosphatase: 111 U/L (ref 38–126)
Anion gap: 10 (ref 5–15)
BUN: 13 mg/dL (ref 8–23)
CO2: 25 mmol/L (ref 22–32)
Calcium: 9.1 mg/dL (ref 8.9–10.3)
Chloride: 100 mmol/L (ref 98–111)
Creatinine, Ser: 0.81 mg/dL (ref 0.44–1.00)
GFR, Estimated: >60 mL/min (ref >60)
Glucose, Bld: 153 mg/dL — ABNORMAL HIGH (ref 70–99)
Potassium: 3.3 mmol/L — ABNORMAL LOW (ref 3.5–5.1)
Sodium: 135 mmol/L (ref 135–145)
Total Bilirubin: 1.2 mg/dL (ref 0.3–1.2)
Total Protein: 6.7 g/dL (ref 6.5–8.1)

## 2022-04-28 LAB — DIFFERENTIAL
Abs Immature Granulocytes: 0.01 10*3/uL (ref 0.00–0.07)
Basophils Absolute: 0 10*3/uL (ref 0.0–0.1)
Basophils Relative: 0 %
Eosinophils Absolute: 0 10*3/uL (ref 0.0–0.5)
Eosinophils Relative: 0 %
Immature Granulocytes: 0 %
Lymphocytes Relative: 19 %
Lymphs Abs: 1.2 10*3/uL (ref 0.7–4.0)
Monocytes Absolute: 0.3 10*3/uL (ref 0.1–1.0)
Monocytes Relative: 5 %
Neutro Abs: 4.6 10*3/uL (ref 1.7–7.7)
Neutrophils Relative %: 76 %

## 2022-04-28 LAB — I-STAT CHEM 8, ED
BUN: 15 mg/dL (ref 8–23)
Calcium, Ion: 1.16 mmol/L (ref 1.15–1.40)
Chloride: 100 mmol/L (ref 98–111)
Creatinine, Ser: 0.7 mg/dL (ref 0.44–1.00)
Glucose, Bld: 155 mg/dL — ABNORMAL HIGH (ref 70–99)
HCT: 35 % — ABNORMAL LOW (ref 36.0–46.0)
Hemoglobin: 11.9 g/dL — ABNORMAL LOW (ref 12.0–15.0)
Potassium: 3.4 mmol/L — ABNORMAL LOW (ref 3.5–5.1)
Sodium: 139 mmol/L (ref 135–145)
TCO2: 27 mmol/L (ref 22–32)

## 2022-04-28 LAB — AMMONIA: Ammonia: 25 umol/L (ref 9–35)

## 2022-04-28 LAB — CBC
HCT: 36.1 % (ref 36.0–46.0)
Hemoglobin: 11.9 g/dL — ABNORMAL LOW (ref 12.0–15.0)
MCH: 28.7 pg (ref 26.0–34.0)
MCHC: 33 g/dL (ref 30.0–36.0)
MCV: 87.2 fL (ref 80.0–100.0)
Platelets: 196 10*3/uL (ref 150–400)
RBC: 4.14 MIL/uL (ref 3.87–5.11)
RDW: 13.2 % (ref 11.5–15.5)
WBC: 6.1 10*3/uL (ref 4.0–10.5)
nRBC: 0 % (ref 0.0–0.2)

## 2022-04-28 LAB — RESP PANEL BY RT-PCR (FLU A&B, COVID) ARPGX2
Influenza A by PCR: NEGATIVE
Influenza B by PCR: NEGATIVE
SARS Coronavirus 2 by RT PCR: NEGATIVE

## 2022-04-28 LAB — CBG MONITORING, ED
Glucose-Capillary: 153 mg/dL — ABNORMAL HIGH (ref 70–99)
Glucose-Capillary: 144 mg/dL — ABNORMAL HIGH (ref 70–99)

## 2022-04-28 LAB — APTT: aPTT: 34 seconds (ref 24–36)

## 2022-04-28 LAB — PROTIME-INR
INR: 1 (ref 0.8–1.2)
Prothrombin Time: 13.6 seconds (ref 11.4–15.2)

## 2022-04-28 LAB — LIPASE, BLOOD: Lipase: 27 U/L (ref 11–51)

## 2022-04-28 MED ORDER — SODIUM CHLORIDE 0.9 % IV BOLUS
1000.0000 mL | Freq: Once | INTRAVENOUS | Status: AC
  Administered 2022-04-28: 1000 mL via INTRAVENOUS

## 2022-04-28 MED ORDER — ADULT MULTIVITAMIN W/MINERALS CH
1.0000 | ORAL_TABLET | Freq: Every day | ORAL | Status: DC
  Administered 2022-04-28 – 2022-04-29 (×2): 1 via ORAL
  Filled 2022-04-28 (×2): qty 1

## 2022-04-28 MED ORDER — IOHEXOL 350 MG/ML SOLN
100.0000 mL | Freq: Once | INTRAVENOUS | Status: AC | PRN
  Administered 2022-04-28: 100 mL via INTRAVENOUS

## 2022-04-28 MED ORDER — LAMOTRIGINE 25 MG PO TABS
175.0000 mg | ORAL_TABLET | Freq: Every day | ORAL | Status: DC
  Administered 2022-04-29: 175 mg via ORAL
  Filled 2022-04-28 (×3): qty 1

## 2022-04-28 MED ORDER — PREGABALIN 100 MG PO CAPS
200.0000 mg | ORAL_CAPSULE | Freq: Three times a day (TID) | ORAL | Status: DC
  Administered 2022-04-29 (×2): 200 mg via ORAL
  Filled 2022-04-28 (×2): qty 2

## 2022-04-28 MED ORDER — SODIUM CHLORIDE 0.9 % IV SOLN: Freq: Once | INTRAVENOUS | Status: AC

## 2022-04-28 MED ORDER — SENNOSIDES-DOCUSATE SODIUM 8.6-50 MG PO TABS: 1.0000 | ORAL_TABLET | Freq: Every evening | ORAL | Status: DC | PRN

## 2022-04-28 MED ORDER — ENOXAPARIN SODIUM 40 MG/0.4ML IJ SOSY
40.0000 mg | PREFILLED_SYRINGE | INTRAMUSCULAR | Status: DC
  Administered 2022-04-28: 40 mg via SUBCUTANEOUS
  Filled 2022-04-28: qty 0.4

## 2022-04-28 MED ORDER — INSULIN ASPART 100 UNIT/ML IJ SOLN
0.0000 [IU] | INTRAMUSCULAR | Status: DC
  Administered 2022-04-28: 2 [IU] via SUBCUTANEOUS

## 2022-04-28 MED ORDER — PANTOPRAZOLE SODIUM 40 MG PO TBEC
40.0000 mg | DELAYED_RELEASE_TABLET | Freq: Every day | ORAL | Status: DC
  Administered 2022-04-28 – 2022-04-29 (×2): 40 mg via ORAL
  Filled 2022-04-28 (×2): qty 1

## 2022-04-28 MED ORDER — SODIUM CHLORIDE 0.9% FLUSH
3.0000 mL | Freq: Once | INTRAVENOUS | Status: AC
  Administered 2022-04-28: 3 mL via INTRAVENOUS

## 2022-04-28 MED ORDER — LEVETIRACETAM 500 MG PO TABS
500.0000 mg | ORAL_TABLET | Freq: Two times a day (BID) | ORAL | Status: DC
  Administered 2022-04-29 (×2): 500 mg via ORAL
  Filled 2022-04-28 (×2): qty 1

## 2022-04-28 MED ORDER — LAMOTRIGINE 25 MG PO TABS: 25.0000 mg | ORAL_TABLET | Freq: Every day | ORAL | Status: DC

## 2022-04-28 MED ORDER — ATORVASTATIN CALCIUM 10 MG PO TABS
5.0000 mg | ORAL_TABLET | Freq: Every day | ORAL | Status: DC
  Administered 2022-04-28 – 2022-04-29 (×2): 5 mg via ORAL
  Filled 2022-04-28 (×2): qty 1

## 2022-04-28 MED ORDER — POTASSIUM CHLORIDE CRYS ER 20 MEQ PO TBCR
40.0000 meq | EXTENDED_RELEASE_TABLET | Freq: Once | ORAL | Status: AC
  Administered 2022-04-28: 40 meq via ORAL
  Filled 2022-04-28: qty 2

## 2022-04-28 MED ORDER — ACETAMINOPHEN 325 MG PO TABS: 650.0000 mg | ORAL_TABLET | Freq: Four times a day (QID) | ORAL | Status: DC | PRN

## 2022-04-28 MED ORDER — DULOXETINE HCL 60 MG PO CPEP
60.0000 mg | ORAL_CAPSULE | Freq: Every day | ORAL | Status: DC
  Administered 2022-04-28 – 2022-04-29 (×2): 60 mg via ORAL
  Filled 2022-04-28 (×2): qty 1

## 2022-04-28 MED ORDER — LAMOTRIGINE 150 MG PO TABS
150.0000 mg | ORAL_TABLET | Freq: Every day | ORAL | Status: DC
  Filled 2022-04-28: qty 1

## 2022-04-28 MED ORDER — FAMOTIDINE 20 MG PO TABS: 40.0000 mg | ORAL_TABLET | Freq: Every day | ORAL | Status: DC

## 2022-04-28 MED ORDER — FLUTICASONE FUROATE-VILANTEROL 200-25 MCG/ACT IN AEPB
1.0000 | INHALATION_SPRAY | Freq: Every day | RESPIRATORY_TRACT | Status: DC
  Administered 2022-04-29: 1 via RESPIRATORY_TRACT
  Filled 2022-04-28: qty 28

## 2022-04-28 NOTE — ED Triage Notes (Signed)
Husband reports unsteady gait and confusion.  Last known well 9:30am.  Pt able to name a pen but unable to name a phone.  No arm drift.  Code Stroke Activated at triage. ?

## 2022-04-28 NOTE — ED Notes (Signed)
Patient to CT scan via wheelchair.  Husband states patient was acting al little "off" this morning when she woke up. This puts her last known well time at last night when she went to bed.  ?

## 2022-04-28 NOTE — Progress Notes (Signed)
good evening, this is MRI and it has been brought to our attention this pt in confused and not AO X4, leaving [place] unable to safely scan her at this time per the MRI guidelines from [employer] on her stimulator implant. There are potential interactions with stimulator system that may result in heating, induced stimulation, which may cause discomfort, pain, or burns. we will not be able to scan this pt until she is alert enough to squeeze our emergency ball to notify us immediately if any of these effects become uncomfortable or intolerable. Please update [place] when this pt is AO X4. Thank you ?

## 2022-04-28 NOTE — ED Notes (Signed)
Patient had fentanyl patch on. Removed and disposed of by Dr. Jaclyn Shaggy. ?

## 2022-04-28 NOTE — Procedures (Signed)
Patient Name: Brenda Morgan  ?MRN: [id number]  ?Epilepsy Attending: Charlsie Quest  ?Referring Physician/Provider: Rejeana Brock, MD ?Date: 04/28/2022 ?Duration: 25.24 mins ? ?Patient history: 66yo f with ams. EEG to evaluate for seizure ? ?Level of alertness: Awake, asleep ? ?AEDs during EEG study: None ? ?Technical aspects: This EEG study was done with scalp electrodes positioned according to the 10-20 International system of electrode placement. Electrical activity was acquired at a sampling rate of 500Hz  and reviewed with a high frequency filter of 70Hz  and a low frequency filter of 1Hz . EEG data were recorded continuously and digitally stored.  ? ?Description: The posterior dominant rhythm consists of 8-9 Hz activity of moderate voltage (25-35 uV) seen predominantly in posterior head regions, symmetric and reactive to eye opening and eye closing. Sleep was characterized by vertex waves, sleep spindles (12 to 14 Hz), maximal frontocentral region. Physiologic photic driving was not seen during photic stimulation.  Hyperventilation was not performed.    ? ?IMPRESSION: ?This study is within normal limits. No seizures or epileptiform discharges were seen throughout the recording. ? ?  ? ?

## 2022-04-28 NOTE — ED Notes (Signed)
MRI made aware that patient has her spinal chord stimulator remote. ?

## 2022-04-28 NOTE — Consult Note (Signed)
Neurology Consultation ?Reason for Consult: Altered mental status ?Referring Physician: Carmie Morgan, A ? ?CC: Altered mental status ? ?History is obtained from: Patient, husband ? ?HPI: Brenda Morgan is a 66 y.o. female with a history of diabetes, hypertension, seizures as well as multiple presentations consistent with encephalopathy, previously attributed to either hypoxia or urinary tract infection or other problems.  She states that she felt off when she first woke up this morning, but then worsened over the course of the morning.  Her husband reports that he helped her to the bathroom where she used the urinal and it was very pungent, concerning for infection.  She has markedly worsened over the course of the morning and therefore he presented to the emergency department where a code stroke was activated. ? ? ?LKW: Prior to bed, 5/5 ?tpa given?: no, outside of window ? ? ?ROS: A 14 point ROS was performed and is negative except as noted in the HPI.  ?Past Medical History:  ?Diagnosis Date  ? Anxiety   ? Arthritis   ? CHF (congestive heart failure) (HCC)   ? COPD (chronic obstructive pulmonary disease) (HCC)   ? Diabetes mellitus without complication (HCC)   ? Fatty liver 2012  ? per pt 06/24/20  ? GERD (gastroesophageal reflux disease)   ? Heart murmur   ? Hypertension   ? Peripheral vascular disease (HCC)   ? Seizures (HCC)   ? Sleep apnea   ? ? ? ?Family History  ?Problem Relation Age of Onset  ? Diabetes Mother   ? Heart disease Mother   ? Stomach cancer Mother   ? Diabetes Father   ? Heart disease Father   ? Lung cancer Father   ? ? ? ?Social History:  reports that she quit smoking about a year ago. Her smoking use included cigarettes. She smoked an average of 1 pack per day. She has never used smokeless tobacco. She reports that she does not drink alcohol and does not use drugs. ? ? ?Exam: ?Current vital signs: ?BP (!) 150/84   Pulse 68   Temp 98.4 ?F (36.9 ?C) (Oral)   Resp 18   SpO2 96%  ?Vital signs in  last 24 hours: ?Temp:  [98.4 ?F (36.9 ?C)] 98.4 ?F (36.9 ?C) (05/06 1244) ?Pulse Rate:  [68] 68 (05/06 1244) ?Resp:  [18] 18 (05/06 1244) ?BP: (150)/(84) 150/84 (05/06 1244) ?SpO2:  [96 %] 96 % (05/06 1244) ? ? ?Physical Exam  ?Constitutional: Appears well-developed and well-nourished.  ?Psych: Affect appropriate to situation ?Eyes: No scleral injection ?HENT: No OP obstruction ?MSK: no joint deformities.  ?Cardiovascular: Normal rate and regular rhythm.  ?Respiratory: Effort normal, non-labored breathing ?GI: Soft.  No distension. There is no tenderness.  ?Skin: WDI ? ?Neuro: ?Mental Status: ?She appears aphasic, with significant difficulty with answering questions, or naming.   ?Cranial Nerves: ?II: Visual Fields are full. Pupils are equal, round, and reactive to light.   ?III,IV, VI: EOMI without ptosis or diploplia.  ?V: Facial sensation is symmetric to temperature ?VII: Facial movement is symmetric.  ?VIII: hearing is intact to voice ?X: Uvula elevates symmetrically ?XI: Shoulder shrug is symmetric. ?XII: tongue is midline without atrophy or fasciculations.  ?Motor: ?She appears to have mild right leg weakness, otherwise 5/5 ?Sensory: ?Sensation is symmetric to light touch and temperature in the arms and legs. ?Cerebellar: ?Does not perform ? ?I have reviewed labs in epic and the results pertinent to this consultation are: ?K 3.3 ?Sodium 135 ?BUN 13 ?Creatinine 0.8 ?  Calcium 9.1 ?Normal LFTs ?Ammonia normal ? ?I have reviewed the images obtained: CT head-negative ?CTA-negative ? ?Impression: 66 year old female with acute encephalopathy.  The description of her having previous episodes of similar character, does make me concerned for possible seizure-like activity.  Stroke is a possibility, and MRI should certainly be obtained.  Metabolic encephalopathy including UTI, medication related would also be a possibility with her being on multiple sedating medications including Lyrica, Robaxin, oxycodone,  etc. ? ?Recommendations: ?1) continue home antiepileptics, including Lyrica ?2) MRI brain ?3) EEG ?4) stroke work-up only if MRI is positive ?5) UA ?6) TSH ? ? ?Brenda Slot, MD ?[facility] ?[phone] ? ?If 7pm- 7am, please page neurology on call as listed in AMION. ? ?

## 2022-04-28 NOTE — H&P (Addendum)
Family Medicine Teaching Service ?Hospital Admission History and Physical ?Service Pager: [phone] ? ?Patient name: Brenda Morgan Medical record number: [id number] ?Date of birth: 01-Nov-1956 Age: 66 y.o. Gender: female ? ?Primary Care Provider: Medicine, [facility] ?Consultants: Neurology ?Code Status: Full Code  ?Preferred Emergency Contact:  Primary Emergency Contact: ADAMARI, FREDE, Home Phone: [phone] ? ?Chief Complaint: Unsteady gait and confusion ? ?Assessment and Plan: ?Brenda Morgan is a 66 y.o. female with PMH seizures, HFpEF (2018), DM2, HTN, COPD, CKD, PVD, OSA no CPAP, chronic pain on opioids, NASH, past EtOH abuse, unintentional tylenol OD (2012), who presented to [facility] (04/28/2022) for unsteady gait and confusion.   ? ? ?Toxic Encephalopathy 2/2 unintentional fentanyl overdose? + UTI? In the setting of polypharmacy ?Presented as a code stroke for unsteady gait and confusion per husband with LNK 9:30AM. Her was BP 160s, afebrile, respirations stable at room air, no tachycardia nor tachypnea. CBG 150s. CT negative, CTA perfusion and head and neck unremarkable, rEEG wnl, MRI pending. Per neuro, no focal deficits, no post-ictal state. No leukocytosis, no electrolyte derangements, LFTs and RFT wnl, lipase wnl, INR and PLT wnl.  Ammonia wnl. CXR negative. Respiratory panel negative. TSH and UDS pending. Patient is on multiple deliriogenic medications including Percocets, pramipexole.  Per RN, mentation may have improved after removing a fentanyl patch found to the ED provider.  Per PDMP and dispenser record, patient was last prescribed fentanyl 50 mcg x10 patches for 30 days in 11/2021, 12/2021, 02/15/2022.  Patient unable to give a coherent history, unclear how often she takes her pain medications, but did report med adherence. ?Patient's mental status exam was inconsistent in attention, orientation, memory, comprehension, and speech fluency.  ?Patient admitted to med telemetry with attending  McDiarmid, Leighton Roach, MD  ?Neurology following, appreciate assistance recommendations ?Vitals per unit routine ?PT/OT eval and treat ?Seizure precautions ?Diet: HH/Carb mod ?Follow-up: BMP, TSH, UDS, UA, MRI ?Multivitamin ? ?Dysuria ?Per pharmacy med rec, patient was on doxycycline 100 mg twice daily with last dose 04/27/2022, however last dispense showed 02/14/2022. Unclear.  ?UA pending ?Holding doxycycline 100 mg twice daily ? ?Hypokalemia  ?K 3.3, repleted with  ?Labs per above ? ?Chronic pain on opioids ?Home: Percocet 10 mg twice daily, duloxetine 30 mg daily ?Held Percocet ?Restarted duloxetine ? ?HFpEF (2018) stable ?Home: Furosemide 40 mg, spironolactone 50 mg  ?Holding home antihypertensives until MRI r/o stroke ? ?Restless leg syndrome ?Home: Pramipexole 0.5 mg BID - held ? ?DM2 ?Home: Ozempic weekly ?mSSI ? ?Seizures ?Home: Keppra 500mg  BID, Lamotrigine 25mg  qAM + 150mg  qPM ?Restarted keppra and lamictal ? ?GERD ?Home: Famotidine 40 mg daily ? ?MDD ?Trazodone 100mg  qHS - held ? ?FEN/GI: Heart Healthy, carb mod ?Prophylaxis: enoxaparin (LOVENOX) injection 40 mg Start: 04/28/22 1845  ? ?Disposition: Pending PT/OT, pending clinical improvement ? ? ?History of Present Illness:   ?Brenda Morgan is a 66 y.o. female with PMH seizures, HFpEF (2018), DM2, HTN, COPD, CKD, PVD, OSA no CPAP, chronic pain on opioids, NASH, past EtOH abuse, unintentional tylenol OD (2012), who presented to [facility] (04/28/2022) for unsteady gait and confusion.   ? ?When asking patient what brought her in here, difficult to express. States "her husband" and could not elaborate further. Able to state name. Could not state age and could not count backward from 10. Able to identify watch, bracelet. Say "happy hippopotamus", Able to state city, unable to say year. Able to recall current and previous president. States she lives with husband. Feels safe at home. Wants  her husband here. Husband helps manage medication. She is not able to tell  us when she last took her medications.  ? ?Does endorse abdominal pain, chest pain, difficulty breathing. Denies using CPAP at home. Endorses diarrhea unsure how many times a day. Denies blood in stool. Endorses pain with urination. States she has a UTI and took medication for it but not sure what she took.  ? ?Denies alcohol use, tobacco use, recreational drug use . States she smoked for over 10 years. Denies hx of alcohol withdrawals.  ? ?Reports hx of liver failure. Does take pain medications, unable to recall what.  ? ?Ambulates with walker at baseline. ? ?Review Of Systems:  ?Per HPI with the following additions: ?Review of Systems  ?Constitutional:  Negative for chills, fatigue and fever.  ?Respiratory:  Negative for chest tightness and shortness of breath.   ?Cardiovascular:  Negative for chest pain and palpitations.  ?Gastrointestinal:  Positive for abdominal pain and diarrhea. Negative for abdominal distention, blood in stool, constipation, nausea and vomiting.  ?Genitourinary:  Positive for dysuria.  ?Neurological:  Negative for dizziness, syncope, weakness, light-headedness, numbness and headaches.   ? ?Patient Active Problem List  ? Diagnosis Date Noted  ? Toxic encephalopathy 04/28/2022  ? Altered mental status   ? Stroke-like symptoms   ? Hypokalemia   ? S/P total knee arthroplasty, left 01/16/2022  ? Pneumonia due to COVID-19 virus 08/28/2021  ? Acute respiratory failure with hypoxia and hypercapnia (HCC)   ? Shock (HCC)   ? Acute metabolic encephalopathy   ? Venous stasis dermatitis of both lower extremities 12/21/2020  ? AKI (acute kidney injury) (HCC) 09/15/2020  ? Infection of lumbar spine (HCC) 08/02/2020  ? Hardware complicating wound infection (HCC) 08/02/2020  ? COPD (chronic obstructive pulmonary disease) (HCC) 08/01/2020  ? DM II (diabetes mellitus, type II), controlled (HCC) 08/01/2020  ? HTN (hypertension) 08/01/2020  ? Postoperative wound infection 07/31/2020  ? Lumbar radiculopathy  07/06/2020  ? Lumbar spinal stenosis 06/28/2020  ? ? ?Past Medical History: ?Past Medical History:  ?Diagnosis Date  ? Anxiety   ? Arthritis   ? CHF (congestive heart failure) (HCC)   ? COPD (chronic obstructive pulmonary disease) (HCC)   ? Diabetes mellitus without complication (HCC)   ? Fatty liver 2012  ? per pt 06/24/20  ? GERD (gastroesophageal reflux disease)   ? Heart murmur   ? Hypertension   ? Peripheral vascular disease (HCC)   ? Seizures (HCC)   ? Sleep apnea   ? ? ?Past Surgical History: ?Past Surgical History:  ?Procedure Laterality Date  ? BACK SURGERY    ? carotid artery surgery Right 10/07/2012  ? carotid ultrasound  06/2015  ? CHOLECYSTECTOMY  1981  ? COLONOSCOPY  2014  ? DIAGNOSTIC MAMMOGRAM  03/2017  ? groin lypnoid Left 03/2015  ? LUMBAR WOUND DEBRIDEMENT N/A 08/02/2020  ? Procedure: IRRIGATE AND DEBRIDEMENT OF LUMBAR WOUND, PLACEMENT OF WOUND VAC;  Surgeon: Dawley, Alan Mulder, DO;  Location: [facility] OR;  Service: Neurosurgery;  Laterality: N/A;  ? right heel spur  1994  ? SPINAL CORD STIMULATOR INSERTION N/A 05/03/2021  ? Procedure: Spinal Cord Stimulator Placement;  Surgeon: Renaldo Fiddler, MD;  Location: [facility] OR;  Service: Neurosurgery;  Laterality: N/A;  ? TOTAL KNEE ARTHROPLASTY Left 01/16/2022  ? Procedure: TOTAL KNEE ARTHROPLASTY;  Surgeon: Sheral Apley, MD;  Location: [facility] ORS;  Service: Orthopedics;  Laterality: Left;  ? TUBAL LIGATION  1985  ? wrist trigger release Right 01/2016  ? ? ?  Social History: ?Social History  ? ?Tobacco Use  ? Smoking status: Former  ?  Packs/day: 1.00  ?  Types: Cigarettes  ?  Quit date: 04/23/2021  ?  Years since quitting: 1.0  ? Smokeless tobacco: Never  ?Vaping Use  ? Vaping Use: Never used  ?Substance Use Topics  ? Alcohol use: Never  ? Drug use: Never  ? ?Please also refer to relevant sections of EMR. ? ?Family History: ?Family History  ?Problem Relation Age of Onset  ? Diabetes Mother   ? Heart disease Mother   ? Stomach cancer Mother   ? Diabetes Father   ? Heart  disease Father   ? Lung cancer Father   ? ? ?Allergies and Medications: ?Allergies  ?Allergen Reactions  ? Amlodipine Anaphylaxis, Swelling and Other (See Comments)  ?  Tongue swelling   ? Naltrexone Hives  ? Pen

## 2022-04-28 NOTE — Code Documentation (Signed)
Stroke Response Nurse Documentation ?Code Documentation ? ?Brenda Morgan is a 66 y.o. female arriving to [facility]  via [facility] on 04-28-2022 with past medical hx of COPD, DM, HTN, AKI,OSA, CHF, anxiety. On aspirin 81 mg daily. Code stroke was activated by ED.  ? ?Patient from home where she was LKW at bedtime yesterday and now complaining of confusion and unsteady gait. ? ?Stroke team at the bedside on patient arrival. Labs drawn and patient cleared for CT by Dr. Lockie Mola. Patient to CT with team. NIHSS 5, see documentation for details and code stroke times. Patient with disoriented, not following commands, right leg weakness, and Expressive aphasia  on exam. The following imaging was completed:  CT Head and CTA. Patient is not a candidate for IV Thrombolytic due to being outside the window. Patient is not a candidate for IR due to no LVO on CTA.  ? ?Care Plan: VS and mNIHSS q 2hr.  ? ?Bedside handoff with ED RN [place].   ? ?Marcellina Millin  ?Stroke Response RN ?  ?

## 2022-04-28 NOTE — Progress Notes (Signed)
FPTS Brief Progress Note ? ?S: Evaluated patient in the ED.  Reports she is doing well but is ready to get to a room on the floor.  No other complaints or concerns at this time. ? ? ?O: ?BP (!) 130/95   Pulse 73   Temp 98.4 ?F (36.9 ?C) (Oral)   Resp 17   Wt 90.6 kg   SpO2 100%   BMI 32.24 kg/m?   ?General: Pleasant 66 year old female ?Cardiac: Regular rate ?Respiratory: Normal work of breathing ?Neuro: No gross abnormalities ?Psych: Patient is oriented to person and place but not time, cannot identify the year or the month.  Does know who the president is and who the last president was ? ?A/P: ?Continue plan per day team ?- Orders reviewed. Labs for AM ordered, which was adjusted as needed.  ? ? ?Derrel Nip, MD ?04/28/2022, 9:37 PM ?PGY-3, [place] [facility] Night Resident  ?Please page [phone] with questions.  ? ? ?

## 2022-04-28 NOTE — ED Provider Notes (Signed)
?[facility] EMERGENCY DEPARTMENT ?Provider Note ? ? ?CSN: [id number] ?Arrival date & time: 04/28/22  1232 ? ?  ? ?History ? ?Chief Complaint  ?Patient presents with  ? Code Stroke  ? ? ?Brenda Morgan is a 66 y.o. female. ? ?Patient woke up this morning around 930 am with confusion, moaning, gait issue, right leg weakness? Last known normal before going to bed last night. 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 Possible issue with speech. Patient also with hx of anxiety. Level 5 caveat due to confusion.  Husband states that she just has not been herself this morning.  Does not describe any seizure activity.  He thinks that she is having trouble getting her words out. ? ?The history is provided by a caregiver and the patient.  ? ?  ? ?Home Medications ?Prior to Admission medications   ?Medication Sig Start Date End Date Taking? Authorizing Provider  ?acetaminophen (TYLENOL) 500 MG tablet Take 1,000 mg by mouth every 6 (six) hours as needed for moderate pain, headache or fever.    [provider]  ?acetaminophen (TYLENOL) 500 MG tablet Take 2 tablets (1,000 mg total) by mouth every 6 (six) hours as needed for mild pain or moderate pain. 01/17/22   Jenne Pane, PA-C  ?aspirin EC 81 MG tablet Take 1 tablet (81 mg total) by mouth 2 (two) times daily. For DVT prophylaxis for 30 days after surgery. 01/17/22   Jenne Pane, PA-C  ?atorvastatin (LIPITOR) 10 MG tablet Take 5 mg by mouth daily.    [provider]  ?azelastine (ASTELIN) 0.1 % nasal spray Place 2 sprays into both nostrils 2 (two) times daily as needed for rhinitis (congestion).  07/30/20   [provider]  ?carvedilol (COREG) 25 MG tablet Take 25 mg by mouth daily.    [provider]  ?cetirizine (ZYRTEC) 10 MG tablet Take 10 mg by mouth daily.    [provider]  ?COLLAGEN PO Take 1 tablet by mouth daily.    [provider]  ?Continuous Blood Gluc Sensor (FREESTYLE  LIBRE 14 DAY SENSOR) MISC Apply topically every 14 (fourteen) days. 08/17/21   [provider]  ?Continuous Blood Gluc Sensor (FREESTYLE LIBRE 14 DAY SENSOR) MISC APPLY 1 SENSOR EVERY 14 DAYS AS DIRECTED 02/24/21   [provider]  ?diclofenac Sodium (VOLTAREN) 1 % GEL Apply 4 g topically 4 (four) times daily. ?Patient taking differently: Apply 4 g topically daily as needed (pain). 09/23/20   Melene Plan, DO  ?doxycycline (VIBRAMYCIN) 100 MG capsule TAKE 1 CAPSULE BY MOUTH TWICE A DAY 12/26/21   Blanchard Kelch, NP  ?DULoxetine (CYMBALTA) 30 MG capsule Take 30 mg by mouth daily.    [provider]  ?Empagliflozin-metFORMIN HCl ER (SYNJARDY XR) 25-1000 MG TB24 Take 1 tablet by mouth daily.    [provider]  ?famotidine (PEPCID) 40 MG tablet Take 40 mg by mouth at bedtime. 09/09/20   [provider]  ?fentaNYL (DURAGESIC) 50 MCG/HR Place 1 patch onto the skin every 3 (three) days. 09/15/21   [provider]  ?ferrous sulfate 325 (65 FE) MG tablet Take 325 mg by mouth daily with breakfast.    [provider]  ?Fluticasone-Salmeterol (ADVAIR) 250-50 MCG/DOSE AEPB Inhale 1 puff into the lungs 2 (two) times daily as needed (shortness of breath/wheezing).     [provider]  ?furosemide (LASIX) 40 MG tablet Take 40 mg by mouth daily.  [provider]  ?HYDROmorphone (DILAUDID) 2 MG tablet Take 1 tablet (2 mg total) by mouth every 6 (six) hours as needed for severe pain (not relieved by Oxycodone). 01/17/22   Jenne PaneGawne, Meghan M, PA-C  ?hydrOXYzine (ATARAX/VISTARIL) 25 MG tablet Take 25 mg by mouth at bedtime. 07/30/20   [provider]  ?icosapent Ethyl (VASCEPA) 1 g capsule Take 2 g by mouth 2 (two) times daily.    [provider]  ?insulin aspart protamine - aspart (NOVOLOG MIX 70/30 FLEXPEN) (70-30) 100 UNIT/ML FlexPen Inject into the skin daily as needed (High blood sugar). Sliding scale ?Per pt at preop appt pt takes once a day  in the am if glucose if greater than 90. 09/15/21   [provider]  ?lamoTRIgine (LAMICTAL) 150 MG tablet Take 1 tablet (150 mg total) by mouth at bedtime. Take with 25 for a total of 175 mg at bedtime 12/28/21 01/27/22  Windell Norfolkamara, Amadou, MD  ?lamoTRIgine (LAMICTAL) 25 MG tablet Take 1 tablet (25 mg total) by mouth at bedtime. Take with 150 mg for a total of 175 mg at bedtime 12/28/21 01/27/22  Windell Norfolkamara, Amadou, MD  ?levETIRAcetam (KEPPRA) 500 MG tablet Take 1 tablet (500 mg total) by mouth 2 (two) times daily. 12/28/21 03/28/22  Windell Norfolkamara, Amadou, MD  ?losartan (COZAAR) 25 MG tablet Take 25 mg by mouth daily. 09/28/20   [provider]  ?lubiprostone (AMITIZA) 24 MCG capsule Take 24 mcg by mouth daily. 09/25/21   [provider]  ?magnesium oxide (MAG-OX) 400 MG tablet Take 400 mg by mouth daily.    [provider]  ?methocarbamol (ROBAXIN-750) 750 MG tablet Take 1 tablet (750 mg total) by mouth every 8 (eight) hours as needed for muscle spasms. 01/17/22   Jenne PaneGawne, Meghan M, PA-C  ?metolazone (ZAROXOLYN) 5 MG tablet Take 5 mg by mouth 3 (three) times a week. Sunday,Wednesday,friday    [provider]  ?ondansetron (ZOFRAN-ODT) 4 MG disintegrating tablet Take 1 tablet (4 mg total) by mouth 2 (two) times daily as needed for nausea or vomiting. 01/17/22   Jenne PaneGawne, Meghan M, PA-C  ?oxyCODONE-acetaminophen (PERCOCET) 10-325 MG tablet Take 1 tablet by mouth every 8 (eight) hours as needed for pain. 12/13/21   [provider]  ?OZEMPIC, 1 MG/DOSE, 4 MG/3ML SOPN Inject 1 mg into the skin once a week. Take on Sunday 09/14/20   [provider]  ?pantoprazole (PROTONIX) 40 MG tablet Take 40 mg by mouth daily.    [provider]  ?pioglitazone (ACTOS) 15 MG tablet Take 15 mg by mouth daily.    [provider]  ?potassium chloride SA (KLOR-CON) 20 MEQ tablet Take 20 mEq by mouth daily.    [provider]  ?pramipexole (MIRAPEX) 0.5 MG tablet Take 1 mg by mouth at  bedtime.    [provider]  ?pregabalin (LYRICA) 200 MG capsule Take 200 mg by mouth in the morning, at noon, and at bedtime.    [provider]  ?spironolactone (ALDACTONE) 50 MG tablet Take 50 mg by mouth daily.    [provider]  ?traZODone (DESYREL) 100 MG tablet Take 100 mg by mouth at bedtime.    [provider]  ?   ? ?Allergies    ?Amlodipine, Naltrexone, Penicillins, and Penicillin g   ? ?Review of Systems   ?Review of Systems ? ?Physical Exam ?Updated Vital Signs ? ?ED Triage Vitals  ?Enc Vitals Group  ?   BP 04/28/22 1244 (!) 150/84  ?  Pulse Rate 04/28/22 1244 68  ?   Resp 04/28/22 1244 18  ?   Temp 04/28/22 1244 98.4 ?F (36.9 ?C)  ?   Temp Source 04/28/22 1244 Oral  ?   SpO2 04/28/22 1244 96 %  ?   Weight 04/28/22 1320 199 lb 11.8 oz (90.6 kg)  ?   Height --   ?   Head Circumference --   ?   Peak Flow --   ?   Pain Score 04/28/22 1237 0  ?   Pain Loc --   ?   Pain Edu? --   ?   Excl. in GC? --   ? ? ?Physical Exam ?Vitals and nursing note reviewed.  ?Constitutional:   ?   General: She is in acute distress.  ?   Appearance: She is well-developed. She is ill-appearing.  ?HENT:  ?   Head: Normocephalic and atraumatic.  ?   Nose: Nose normal.  ?   Mouth/Throat:  ?   Mouth: Mucous membranes are moist.  ?Eyes:  ?   Extraocular Movements: Extraocular movements intact.  ?   Conjunctiva/sclera: Conjunctivae normal.  ?   Pupils: Pupils are equal, round, and reactive to light.  ?Cardiovascular:  ?   Rate and Rhythm: Normal rate and regular rhythm.  ?   Pulses: Normal pulses.  ?   Heart sounds: Normal heart sounds. No murmur heard. ?Pulmonary:  ?   Effort: Pulmonary effort is normal. No respiratory distress.  ?   Breath sounds: Normal breath sounds.  ?Abdominal:  ?   Palpations: Abdomen is soft.  ?   Tenderness: There is no abdominal tenderness.  ?Musculoskeletal:     ?   General: No swelling. Normal range of motion.  ?   Cervical back: Normal range of motion and neck supple.   ?Skin: ?   General: Skin is warm and dry.  ?   Capillary Refill: Capillary refill takes less than 2 seconds.  ?Neurological:  ?   Mental Status: She is alert.  ?   Comments: Patient is slightly slow to r

## 2022-04-28 NOTE — Progress Notes (Signed)
EEG complete - results pending 

## 2022-04-29 ENCOUNTER — Encounter (HOSPITAL_COMMUNITY): Payer: Self-pay | Admitting: Student

## 2022-04-29 ENCOUNTER — Observation Stay (HOSPITAL_COMMUNITY): Payer: BC Managed Care – PPO

## 2022-04-29 DIAGNOSIS — E669 Obesity, unspecified: Secondary | ICD-10-CM

## 2022-04-29 DIAGNOSIS — G929 Unspecified toxic encephalopathy: Secondary | ICD-10-CM | POA: Diagnosis not present

## 2022-04-29 DIAGNOSIS — E66811 Obesity, class 1: Secondary | ICD-10-CM

## 2022-04-29 DIAGNOSIS — M797 Fibromyalgia: Secondary | ICD-10-CM | POA: Diagnosis present

## 2022-04-29 DIAGNOSIS — Z9689 Presence of other specified functional implants: Secondary | ICD-10-CM

## 2022-04-29 DIAGNOSIS — G8929 Other chronic pain: Secondary | ICD-10-CM

## 2022-04-29 DIAGNOSIS — K760 Fatty (change of) liver, not elsewhere classified: Secondary | ICD-10-CM | POA: Insufficient documentation

## 2022-04-29 DIAGNOSIS — G40909 Epilepsy, unspecified, not intractable, without status epilepticus: Secondary | ICD-10-CM

## 2022-04-29 HISTORY — DX: Obesity, unspecified: E66.9

## 2022-04-29 HISTORY — DX: Other chronic pain: G89.29

## 2022-04-29 HISTORY — DX: Obesity, class 1: E66.811

## 2022-04-29 HISTORY — DX: Epilepsy, unspecified, not intractable, without status epilepticus: G40.909

## 2022-04-29 LAB — BASIC METABOLIC PANEL
Anion gap: 6 (ref 5–15)
BUN: 12 mg/dL (ref 8–23)
CO2: 28 mmol/L (ref 22–32)
Calcium: 9 mg/dL (ref 8.9–10.3)
Chloride: 106 mmol/L (ref 98–111)
Creatinine, Ser: 0.94 mg/dL (ref 0.44–1.00)
GFR, Estimated: >60 mL/min (ref >60)
Glucose, Bld: 155 mg/dL — ABNORMAL HIGH (ref 70–99)
Potassium: 4 mmol/L (ref 3.5–5.1)
Sodium: 140 mmol/L (ref 135–145)

## 2022-04-29 LAB — CBG MONITORING, ED: Glucose-Capillary: 136 mg/dL — ABNORMAL HIGH (ref 70–99)

## 2022-04-29 LAB — RAPID URINE DRUG SCREEN, HOSP PERFORMED
Amphetamines: NOT DETECTED
Barbiturates: NOT DETECTED
Benzodiazepines: NOT DETECTED
Cocaine: NOT DETECTED
Opiates: NOT DETECTED
Tetrahydrocannabinol: NOT DETECTED

## 2022-04-29 LAB — TSH: TSH: 0.744 u[IU]/mL (ref 0.350–4.500)

## 2022-04-29 MED ORDER — NYSTATIN 100000 UNIT/GM EX POWD
Freq: Two times a day (BID) | CUTANEOUS | Status: DC
Start: 2022-04-29 — End: 2022-04-29
  Filled 2022-04-29: qty 15

## 2022-04-29 MED ORDER — ACETAMINOPHEN 500 MG PO TABS
500.0000 mg | ORAL_TABLET | Freq: Four times a day (QID) | ORAL | 0 refills | Status: DC | PRN
Start: 2022-04-29 — End: 2023-01-04

## 2022-04-29 MED ORDER — MELATONIN 3 MG PO TABS
3.0000 mg | ORAL_TABLET | Freq: Every day | ORAL | Status: DC
  Administered 2022-04-29: 3 mg via ORAL
  Filled 2022-04-29: qty 1

## 2022-04-29 MED ORDER — ADULT MULTIVITAMIN W/MINERALS CH: 1.0000 | ORAL_TABLET | Freq: Every day | ORAL | Status: DC

## 2022-04-29 MED ORDER — NYSTATIN 100000 UNIT/GM EX POWD: Freq: Two times a day (BID) | CUTANEOUS | 0 refills | Status: DC

## 2022-04-29 NOTE — Progress Notes (Signed)
Family Medicine Teaching Service ?Daily Progress Note ?Intern Pager: [phone] ? ?Patient name: Brenda Morgan Medical record number: [id number] ?Date of birth: [birth year] Age: 66 y.o. Gender: female ? ?Primary Care Provider: Medicine, [facility] ?Consultants: Neuro ?Code Status: Full ? ?Pt Overview and Major Events to Date:  ?5/6 admitted ? ?Assessment and Plan: ?Brenda Morgan is a 66 year old female admitted 5/6 as a code stroke for unsteady gait and confusion, now concern for encephalopathy due to polypharmacy versus UTI versus acute on chronic stroke.  PMH includes seizures, HFpEF (EF 70% 2019), T2DM, HTN, COPD, CKD, PVD, OSA, NASH, chronic pain, past EtOH abuse, unintentional Tylenol OD 2012. ? ?Encephalopathy of unknown etiology ?Mentation much improved this morning, patient is ANO x4.  We will proceed with MRI head this morning, notified MRI tech.  Normal BMP, TSH, glucose.  ?- Appreciate neurology insights ?- UDS pending ?- Await MRI results ?- PT/OT eval and treat ?- Seizure precautions ? ?Dysuria ?Home doxycyline held on admission due to confusion if current med. UA shows dehydration and proteinuria (known CKD), however no signs of infection. Will not pursue antibiotics at this time.  ?  ?Chronic pain on opioids ?Continue home duloxetine. May consider restarting home opioids at lower doses given presenting symptoms; however would not want to precipitate acute withdrawal. ?  ?HFpEF (2018) stable ?Currently on permissive hypertension given unclear if stroke or not.  We will continue to hold home spironolactone and furosemide until after the MRI today. ?  ?Restless leg syndrome ?Hold home pramipexole. ?  ?DM2 ?Glucose in range.  Only on Ozempic weekly at home.  Last A1c 5.8 in January.  Will discontinue CBG checks and sliding scale insulin.  May reorder in the future if there is concern for hyper or hypoglycemia.  Not currently receiving any meds and patient that should alter blood sugar. ?   ?Seizures ?Continue home Lamictal and Keppra.  No seizure activity overnight. ?  ?GERD ?Continue home pantoprazole. ?  ?MDD ?Hold home trazodone given presenting confusion. ?  ? ?Disposition: Pending PT/OT, pending clinical improvement ? ?FEN/GI: Regular diet ?PPx: Lovenox every 24 ?Dispo:Pending PT recommendations  pending clinical improvement . Barriers include continued medical work-up.  ? ?Subjective:  ?Patient found sleeping in bed, awakes easily.  She is ANO x4 on my exam.  She reports she is "ready to go home".  Discussed going for MRI.  She appears to be at baseline mentation given that she can tell me all about her implant, how to turn it off, and navigate her phone to find the [employer] phone numbers for me.  She can also give me her husband's name and phone number from memory.  She has no complaints, and reports no focal deficits. ? ?Objective: ?Temp:  [97.7 ?F (36.5 ?C)-98.4 ?F (36.9 ?C)] 97.7 ?F (36.5 ?C) (05/07 0415) ?Pulse Rate:  [62-77] 66 (05/07 1000) ?Resp:  [12-31] 16 (05/07 1000) ?BP: (106-191)/(38-100) 110/38 (05/07 1000) ?SpO2:  [94 %-100 %] 97 % (05/07 1000) ?Weight:  [90.6 kg] 90.6 kg (05/06 1320) ?Physical Exam: ?General: Awake, alert, ANO x4 ?Cardiovascular: Regular rate and rhythm, no murmurs appreciated ?Respiratory: CTA B ?Abdomen: Soft, nontender ?Extremities: Moving all spontaneously, no focal deficits ? ?Laboratory: ?Recent Labs  ?Lab 04/28/22 ?1238 04/28/22 ?1357  ?WBC 6.1  --   ?HGB 11.9* 11.9*  ?HCT 36.1 35.0*  ?PLT 196  --   ? ?Recent Labs  ?Lab 04/28/22 ?1238 04/28/22 ?1357 04/29/22 ?0853  ?NA 135 139 140  ?K 3.3* 3.4* 4.0  ?  CL 100 100 106  ?CO2 25  --  28  ?BUN 13 15 12   ?CREATININE 0.81 0.70 0.94  ?CALCIUM 9.1  --  9.0  ?PROT 6.7  --   --   ?BILITOT 1.2  --   --   ?ALKPHOS 111  --   --   ?ALT 17  --   --   ?AST 18  --   --   ?GLUCOSE 153* 155* 155*  ? ? ?Imaging/Diagnostic Tests: ?CT HEAD CODE STROKE 04/28/22 ?IMPRESSION: ?1. Advanced chronic small-vessel  ischemic changes of the white ?matter. No sign of acute infarction. ?2. ASPECTS is 10 (NORMAL) ? ?CT ANGIOGRAPHY HEAD AND NECK 04/28/22 ?CT PERFUSION BRAIN ?IMPRESSION: ?No large vessel occlusion. ?  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 ?  ?No completed infarction based on the perfusion study. Question 4 cm ?region of T-max greater than 6 seconds in the subcortical white ?matter of the right parietal lobe. This could be artifactual. ? ?CXR 04/28/22 ?IMPRESSION: ?Mild left basilar opacities, likely atelectasis. ? ? ? ?06/28/22, MD ?04/29/2022, 10:27 AM ?PGY-2, [facility] ?FPTS Intern pager: [phone], text pages welcome  ?

## 2022-04-29 NOTE — ED Notes (Signed)
Breakfast order placed ?

## 2022-04-29 NOTE — Hospital Course (Addendum)
Brenda Morgan is a 66 year old female admitted 5/6 as a code stroke for unsteady gait and confusion concerning for encephalopathy. PMH includes seizures, HFpEF (EF 70% 2019), T2DM, HTN, COPD, CKD, PVD, OSA, NASH, chronic pain, past EtOH abuse, unintentional Tylenol OD 2012. ? ?Transient confusional episodes  ?Initially presented as code stroke, however CT head and CT angio without evidence of acute infarction, hemorrhagic stroke, or vessel occlusion.  MRI, will motion degraded, did not detect any acute or subacute infarction.  It did demonstrate chronic small vessel ischemic changes.  EEG within normal limits.  Lab work unremarkable. 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  Neurology was consulted and believes these episodes of confusion and unsteady gait are most likely seizure activity, but recommends no changes in anti-ecliptics at this time. We also considered TIA, however less likely given risk factors and presentation.  No changes to medications at this time, patient instructed to follow-up closely with her PCP. ? ?High Risk Medication Use ?Patient was noted to become more alert and arouseable when the fentanyl patch was removed and multiple psychoactive medications withheld. Consider discussion with PCP and pain management to reduce or consolidate multiple sedating medications.  ? ?Discharge follow up: ?UA demonstrates dehydration, but also significant proteinuria (>300). PCP to repeat urine studies. Could represent progression of CKD.  ?No changes to her seizure medications per neuro. ?PCP to review and discuss reduction of high risk medication use.  ? ?

## 2022-04-29 NOTE — Progress Notes (Addendum)
Just spoke with MRI tech to notify them of patient's stabilized mentation.  They confirmed they will put her on the schedule for today and proceed with MRI. ? ?Bedside RN reports mentation has been ANO x4 without fluctuation since her start of shift at 0700.  Upon physician bedside examination closer to 0930, patient is AOx4, awake and alert. ? ?Per neuro, will only initiate stroke work up dependent on positive MRI result.  ? ?Fayette Pho, MD ? ?

## 2022-04-29 NOTE — Evaluation (Signed)
Occupational Therapy Evaluation and Discharge ?Patient Details ?Name: Brenda Morgan ?MRN: [id number] ?DOB: 1956-10-10 ?Today's Date: 04/29/2022 ? ? ?History of Present Illness Kindra Bickham is a 66 y.o. female presented to [facility] (04/28/2022) for unsteady gait and confusion. Found to have toxic encephalopathy 2/2 unintentional fentanyl overdose? + UTI? In the setting of polypharmacy. 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 ?Clinical Impression ?  ?This 66 yo female admitted with above presents to acute OT at a Mod I level for basic ADLs at a RW level. Does need S for medication management due to reason she is here.No further OT needs, we will sign off.  ?   ? ?Recommendations for follow up therapy are one component of a multi-disciplinary discharge planning process, led by the attending physician.  Recommendations may be updated based on patient status, additional functional criteria and insurance authorization.  ? ?Follow Up Recommendations ? No OT follow up  ?  ?Assistance Recommended at Discharge PRN  ?Patient can return home with the following Assistance with cooking/housework;Direct supervision/assist for financial management;Direct supervision/assist for medications management ? ?  ?Functional Status Assessment ? Patient has had a recent decline in their functional status and demonstrates the ability to make significant improvements in function in a reasonable and predictable amount of time. (no further skilled OT needs identified)  ?Equipment Recommendations ? None recommended by OT  ?  ?   ?Precautions / Restrictions Precautions ?Precautions: None ?Restrictions ?Weight Bearing Restrictions: No  ? ?  ? ?Mobility Bed Mobility ?  ?  ?  ?  ?  ?  ?  ?General bed mobility comments: Pt sitting on edge of stretcher when I entered room ?  ? ?Transfers ?Overall transfer  level: Modified independent ?  ?Transfers: Sit to/from Stand ?Sit to Stand: Modified independent (Device/Increase time) ?  ?  ?  ?  ?  ?General transfer comment: takes her time and is careful ?  ? ?  ?Balance Overall balance assessment: Mild deficits observed, not formally tested ?  ?  ?  ?  ?  ?  ?  ?  ?  ?  ?  ?  ?  ?  ?  ?  ?  ?  ?   ? ?ADL either performed or assessed with clinical judgement  ? ?ADL Overall ADL's : Modified independent ?  ?  ?  ?  ?  ?  ?  ?  ?  ?  ?  ?  ?  ?  ?  ?  ?  ?  ?  ?General ADL Comments: increased time  ? ? ? ?Vision Baseline Vision/History: 1 Wears glasses ?Patient Visual Report: No change from baseline ?   ?   ?   ?   ? ?Pertinent Vitals/Pain Pain Assessment ?Pain Assessment: No/denies pain  ? ? ? ?Hand Dominance Right ?  ?Extremity/Trunk Assessment Upper Extremity Assessment ?Upper Extremity Assessment: Overall WFL for tasks assessed ?  ?  ?  ?  ?  ?Communication Communication ?Communication: No difficulties ?  ?Cognition Arousal/Alertness: Awake/alert ?Behavior During Therapy: [facility] for tasks assessed/performed ?Overall Cognitive Status: Within Functional Limits for tasks assessed ?  ?  ?  ?  ?  ?  ?  ?  ?  ?  ?  ?  ?  ?  ?  ?  ?  ?  ?  ?   ?   ?   ? ? ?  Home Living Family/patient expects to be discharged to:: Private residence ?Living Arrangements: Spouse/significant other ?Available Help at Discharge: Family;Available 24 hours/day ?Type of Home: Mobile home ?Home Access: Ramped entrance ?  ?  ?Home Layout: One level ?  ?  ?Bathroom Shower/Tub: Walk-in shower ?  ?Bathroom Toilet: Handicapped height ?Bathroom Accessibility: Yes ?  ?Home Equipment: Agricultural consultant (2 wheels);Rollator (4 wheels);Cane - single point;BSC/3in1;Shower seat;Grab bars - toilet;Other (comment);Adaptive equipment Armed forces technical officer) ?Adaptive Equipment: Reacher;Sock aid ?  ?  ? ?  ?Prior Functioning/Environment Prior Level of Function : Independent/Modified Independent ?  ?  ?  ?  ?  ?  ?Mobility Comments: pt has  been using tall rollator for gait for >1 year and was using RW prior to that. ?  ?  ? ?  ?  ?OT Problem List: Impaired balance (sitting and/or standing) ?  ?   ?   ?OT Goals(Current goals can be found in the care plan section) Acute Rehab OT Goals ?Patient Stated Goal: hopeful to get MRI today and go home  ?   ? ?   ?AM-PAC OT "6 Clicks" Daily Activity     ?Outcome Measure Help from another person eating meals?: None ?Help from another person taking care of personal grooming?: None ?Help from another person toileting, which includes using toliet, bedpan, or urinal?: None ?Help from another person bathing (including washing, rinsing, drying)?: None ?Help from another person to put on and taking off regular upper body clothing?: None ?Help from another person to put on and taking off regular lower body clothing?: None ?6 Click Score: 24 ?  ?End of Session Nurse Communication:  (she and husband are trying to charge the internal battery of her spinal stimulator) ? ?Activity Tolerance: Patient tolerated treatment well ?Patient left: in chair;with call bell/phone within reach;with family/visitor present ? ?OT Visit Diagnosis: Unsteadiness on feet (R26.81)  ?              ?Time: 1020-1050 ?OT Time Calculation (min): 30 min ?Charges:  OT General Charges ?$OT Visit: 1 Visit ?OT Evaluation ?$OT Eval Moderate Complexity: 1 Mod ?OT Treatments ?$Self Care/Home Management : 8-22 mins ? ?Ignacia Palma, OTR/L ?Acute Rehab Services ?Pager [phone] ?Office [phone] ? ? ? ?Evette Georges ?04/29/2022, 12:16 PM ?

## 2022-04-29 NOTE — Progress Notes (Signed)
Spoke with patient's husband. [name], on the ED 010 room phone. He reports that if no acute findings on head MRI and we are looking to discharge that we discharge ASAP as he works third shift in [place] starting at 11 pm.  ? ?Fayette Pho, MD ? ?

## 2022-04-29 NOTE — Evaluation (Signed)
Physical Therapy Evaluation and Discharge ?Patient Details ?Name: Brenda Morgan ?MRN: [id number] ?DOB: [birth year] ?Today's Date: 04/29/2022 ? ?History of Present Illness ? Brenda Morgan is a 66 y.o. female presented to [facility] [facility] (04/28/2022) for unsteady gait and confusion. Found to have toxic encephalopathy 2/2 unintentional fentanyl overdose? + UTI? In the setting of polypharmacy. 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 PTA, pt lives with her spouse, uses a Radiation protection practitioner for mobility and is independent with ADL's/IADL's. Pt seems to be back to her functional baseline. Ambulating 200 ft with a RW at a supervision level. No gross imbalance noted. Would recommend supervision for medication management due to reason she is here. No further acute or follow up PT needs. Thank you for this consult. ?   ? ?Recommendations for follow up therapy are one component of a multi-disciplinary discharge planning process, led by the attending physician.  Recommendations may be updated based on patient status, additional functional criteria and insurance authorization. ? ?Follow Up Recommendations No PT follow up ? ?  ?Assistance Recommended at Discharge PRN  ?Patient can return home with the following ? Direct supervision/assist for medications management ? ?  ?Equipment Recommendations None recommended by PT  ?Recommendations for Other Services ?    ?  ?Functional Status Assessment Patient has had a recent decline in their functional status and demonstrates the ability to make significant improvements in function in a reasonable and predictable amount of time.  ? ?  ?Precautions / Restrictions Precautions ?Precautions: None ?Restrictions ?Weight Bearing Restrictions: No  ? ?  ? ?Mobility ? Bed Mobility ?Overal bed mobility: Modified Independent ?  ?  ?  ?  ?  ?  ?  ?   ? ?Transfers ?Overall transfer level: Modified independent ?Equipment used: Rolling walker (2 wheels) ?  ?  ?  ?  ?  ?  ?  ?  ?  ? ?Ambulation/Gait ?Ambulation/Gait assistance: Modified independent (Device/Increase time) ?Gait Distance (Feet): 200 Feet ?Assistive device: Rolling walker (2 wheels) ?Gait Pattern/deviations: Step-through pattern, Decreased stride length ?  ?  ?  ?General Gait Details: Slow and steady pace, no gross imbalance noted, modI ? ?Stairs ?  ?  ?  ?  ?  ? ?Wheelchair Mobility ?  ? ?Modified Rankin (Stroke Patients Only) ?  ? ?  ? ?Balance Overall balance assessment: Mild deficits observed, not formally tested ?  ?  ?  ?  ?  ?  ?  ?  ?  ?  ?  ?  ?  ?  ?  ?  ?  ?  ?   ? ? ? ?Pertinent Vitals/Pain Pain Assessment ?Pain Assessment: Faces ?Faces Pain Scale: Hurts a little bit ?Pain Location: chronic back pain ?Pain Descriptors / Indicators: Guarding ?Pain Intervention(s): Monitored during session  ? ? ?Home Living Family/patient expects to be discharged to:: Private residence ?Living Arrangements: Spouse/significant other ?Available Help at Discharge: Family;Available 24 hours/day ?Type of Home: Mobile home ?Home Access: Ramped entrance ?  ?  ?  ?Home Layout: One level ?Home Equipment: Conservation officer, nature (2 wheels);Rollator (4 wheels);Cane - single point;BSC/3in1;Shower seat;Grab bars - toilet;Other (comment);Adaptive equipment Patent attorney) ?   ?  ?Prior Function Prior Level of Function : Independent/Modified Independent ?  ?  ?  ?  ?  ?  ?Mobility Comments: pt has been using tall rollator for  gait for >1 year and was using RW prior to that. ?  ?  ? ? ?Hand Dominance  ? Dominant Hand: Right ? ?  ?Extremity/Trunk Assessment  ? Upper Extremity Assessment ?Upper Extremity Assessment: Overall WFL for tasks assessed ?  ? ?Lower Extremity Assessment ?Lower Extremity Assessment: RLE deficits/detail;LLE deficits/detail ?RLE Deficits / Details: Strength 5/5 ?LLE Deficits / Details: Strength 5/5 ?   ? ?Cervical / Trunk Assessment ?Cervical / Trunk Assessment: Normal  ?Communication  ? Communication: No difficulties  ?Cognition Arousal/Alertness: Awake/alert ?Behavior During Therapy: [facility] for tasks assessed/performed ?Overall Cognitive Status: Within Functional Limits for tasks assessed ?  ?  ?  ?  ?  ?  ?  ?  ?  ?  ?  ?  ?  ?  ?  ?  ?  ?  ?  ? ?  ?General Comments   ? ?  ?Exercises    ? ?Assessment/Plan  ?  ?PT Assessment Patient does not need any further PT services  ?PT Problem List   ? ?   ?  ?PT Treatment Interventions     ? ?PT Goals (Current goals can be found in the Care Plan section)  ?Acute Rehab PT Goals ?Patient Stated Goal: did not state ?PT Goal Formulation: All assessment and education complete, DC therapy ? ?  ?Frequency   ?  ? ? ?Co-evaluation   ?  ?  ?  ?  ? ? ?  ?AM-PAC PT "6 Clicks" Mobility  ?Outcome Measure Help needed turning from your back to your side while in a flat bed without using bedrails?: None ?Help needed moving from lying on your back to sitting on the side of a flat bed without using bedrails?: None ?Help needed moving to and from a bed to a chair (including a wheelchair)?: None ?Help needed standing up from a chair using your arms (e.g., wheelchair or bedside chair)?: None ?Help needed to walk in hospital room?: None ?Help needed climbing 3-5 steps with a railing? : A Little ?6 Click Score: 23 ? ?  ?End of Session Equipment Utilized During Treatment: Gait belt ?Activity Tolerance: Patient tolerated treatment well ?Patient left: in bed;with call bell/phone within reach ?Nurse Communication: Mobility status ?PT Visit Diagnosis: Unsteadiness on feet (R26.81) ?  ? ?Time: [id number] ?PT Time Calculation (min) (ACUTE ONLY): 20 min ? ? ?Charges:   PT Evaluation ?$PT Eval Low Complexity: 1 Low ?  ?  ?   ? ? ?Brenda Morgan, PT, DPT ?Acute Rehabilitation Services ?Pager [phone] ?Office [phone] ? ? ?Brenda Morgan ?04/29/2022, 1:09 PM ? ?

## 2022-04-29 NOTE — Discharge Summary (Signed)
Family Medicine Teaching Service ?Hospital Discharge Summary ? ?Patient name: Brenda Morgan Medical record number: [id number] ?Date of birth: 1956-12-07 Age: 66 y.o. Gender: female ?Date of Admission: 04/28/2022  Date of Discharge: 04/29/2022 ?Admitting Physician: Princess Bruins, DO ?Primary Care Provider: Medicine, [facility] Internal ?Consultants: Neurology ? ?Indication for Hospitalization: Toxic metabolic encephalopathy ? ?Discharge Diagnoses/Problem List:  ?Transient confusional episode ?Seizure disorder ?COPD ?T2DM ?HTN ?Chronic pain ?Obesity ?Cerebral atrophy on head CT ?Hepatic fibrosis secondary to NAFLD ?Spinal cord stimulator present ? ?Disposition: Home ? ?Discharge Condition: Stable ? ?Discharge Exam:  ?General: Awake, alert, ANO x4 ?Cardiovascular: Regular rate and rhythm, S1 and S2 present, no murmurs auscultated ?Respiratory: Lung fields clear to auscultation bilaterally ?Extremities: No bilateral lower extremity edema, palpable pedal and pretibial pulses bilaterally ?Neuro: Cranial nerves II through X grossly intact, able to move all extremities spontaneously  ? ?Brief Hospital Course:  ?Brenda Morgan is a 66 year old female admitted 5/6 as a code stroke for unsteady gait and confusion concerning for encephalopathy. PMH includes seizures, HFpEF (EF 70% 2019), T2DM, HTN, COPD, CKD, PVD, OSA, NASH, chronic pain, past EtOH abuse, unintentional Tylenol OD 2012. ? ?Transient confusional episodes  ?Initially presented as code stroke, however CT head and CT angio without evidence of acute infarction, hemorrhagic stroke, or vessel occlusion.  MRI, will motion degraded, did not detect any acute or subacute infarction.  It did demonstrate chronic small vessel ischemic changes.  EEG within normal limits.  Lab work unremarkable. 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  Neurology was consulted and believes these episodes of confusion and unsteady gait are most likely seizure activity, but recommends no changes in anti-ecliptics at this time. We also considered TIA, however less likely given risk factors and presentation.  No changes to medications at this time, patient instructed to follow-up closely with her PCP. ? ?High Risk Medication Use ?Patient was noted to become more alert and arouseable when the fentanyl patch was removed and multiple psychoactive medications withheld. Consider discussion with PCP and pain management to reduce or consolidate multiple sedating medications.  ? ?Discharge follow up: ?UA demonstrates dehydration, but also significant proteinuria (>300). PCP to repeat urine studies. Could represent progression of CKD.  ?No changes to her seizure medications per neuro. ?PCP to review and discuss reduction of high risk medication use.  ? ?Significant Procedures: None ? ?Significant Labs and Imaging:  ?Recent Labs  ?Lab 04/28/22 ?1238 04/28/22 ?1357  ?WBC 6.1  --   ?HGB 11.9* 11.9*  ?HCT 36.1 35.0*  ?PLT 196  --   ? ?Recent Labs  ?Lab 04/28/22 ?1238 04/28/22 ?1357 04/29/22 ?0853  ?NA 135 139 140  ?K 3.3* 3.4* 4.0  ?CL 100 100 106  ?CO2 25  --  28  ?GLUCOSE 153* 155* 155*  ?BUN 13 15 12   ?CREATININE 0.81 0.70 0.94  ?CALCIUM 9.1  --  9.0  ?ALKPHOS 111  --   --   ?AST 18  --   --   ?ALT 17  --   --   ?ALBUMIN 3.3*  --   --   ? ? ?CT HEAD CODE STROKE 04/28/22 ?IMPRESSION: ?1. Advanced chronic small-vessel ischemic changes of the white ?matter. No sign of acute infarction. ?2. ASPECTS is 10 (NORMAL) ?  ?CT ANGIOGRAPHY HEAD AND NECK 04/28/22 ?CT PERFUSION  BRAIN ?IMPRESSION: ?No large vessel occlusion. ?  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 ?  ?No completed infarction based on  the perfusion study. Question 4 cm ?region of T-max greater than 6 seconds in the subcortical white ?matter of the right parietal lobe. This could be artifactual. ?  ?CXR 04/28/22 ?IMPRESSION: ?Mild left basilar opacities, likely atelectasis. ? ?MRI HEAD WITHOUT CONTRAST 04/29/22 ?IMPRESSION: ?Motion degraded and limited sequence exam. ?Diffusion imaging does not show any acute or subacute infarction. ?Chronic small-vessel ischemic changes of the cerebral hemispheric ?white matter. ? ?Results/Tests Pending at Time of Discharge: None ? ?Discharge Medications:  ?Allergies as of 04/29/2022   ? ?   Reactions  ? Amlodipine Anaphylaxis, Swelling, Other (See Comments)  ? Tongue swelling   ? Naltrexone Hives  ? Penicillins Hives, Other (See Comments)  ? Tolerated ceftriaxone and cefepime 06/2017 ?Hives  ? Penicillin G   ? ?  ? ?  ?Medication List  ?  ? ?STOP taking these medications   ? ?doxycycline 100 MG capsule ?Commonly known as: VIBRAMYCIN ?  ?methocarbamol 750 MG tablet ?Commonly known as: Robaxin-750 ?  ?traZODone 100 MG tablet ?Commonly known as: DESYREL ?  ? ?  ? ?TAKE these medications   ? ?acetaminophen 500 MG tablet ?Commonly known as: TYLENOL ?Take 1 tablet (500 mg total) by mouth every 6 (six) hours as needed for mild pain or moderate pain. ?What changed: how much to take ?  ?aspirin EC 81 MG tablet ?Take 1 tablet (81 mg total) by mouth 2 (two) times daily. For DVT prophylaxis for 30 days after surgery. ?What changed: when to take this ?  ?atorvastatin 10 MG tablet ?Commonly known as: LIPITOR ?Take 5 mg by mouth daily. ?  ?azelastine 0.1 % nasal spray ?Commonly known as: ASTELIN ?Place 2 sprays into both nostrils 2 (two) times daily as needed for rhinitis (congestion). ?  ?carvedilol 25 MG tablet ?Commonly known as: COREG ?Take 25 mg by mouth daily. ?  ?cetirizine 10 MG tablet ?Commonly known as: ZYRTEC ?Take 10 mg by mouth daily. ?  ?COLLAGEN PO ?Take 1 tablet by mouth daily. ?  ?diclofenac Sodium 1 % Gel ?Commonly  known as: VOLTAREN ?Apply 4 g topically 4 (four) times daily. ?  ?DULoxetine 60 MG capsule ?Commonly known as: CYMBALTA ?Take 60 mg by mouth daily. ?  ?famotidine 40 MG tablet ?Commonly known as: PEPCID ?Take 40 mg by mouth at bedtime. ?  ?fentaNYL 50 MCG/HR ?Commonly known as: DURAGESIC ?Place 1 patch onto the skin every 3 (three) days. ?  ?ferrous sulfate 325 (65 FE) MG tablet ?Take 325 mg by mouth daily with breakfast. ?  ?Fluticasone-Salmeterol 250-50 MCG/DOSE Aepb ?Commonly known as: ADVAIR ?Inhale 1 puff into the lungs 2 (two) times daily as needed (shortness of breath/wheezing). ?  ?FreeStyle Libre 14 Day Sensor Misc ?APPLY 1 SENSOR EVERY 14 DAYS AS DIRECTED ?  ?FreeStyle Libre 14 Day Sensor Misc ?Apply topically every 14 (fourteen) days. ?  ?furosemide 40 MG tablet ?Commonly known as: LASIX ?Take 40 mg by mouth daily. ?  ?hydrOXYzine 25 MG tablet ?Commonly known as: ATARAX ?Take 25 mg by mouth at bedtime. ?  ?icosapent Ethyl 1 g capsule ?Commonly known as: VASCEPA ?Take 2 g by mouth 2 (two) times daily. ?  ?lamoTRIgine 150 MG tablet ?Commonly known as: LAMICTAL ?Take 1 tablet (150 mg total) by mouth at bedtime. Take with 25 for a total of 175 mg at bedtime ?  ?  lamoTRIgine 25 MG tablet ?Commonly known as: LAMICTAL ?Take 1 tablet (25 mg total) by mouth at bedtime. Take with 150 mg for a total of 175 mg at bedtime ?  ?levETIRAcetam 500 MG tablet ?Commonly known as: KEPPRA ?Take 1 tablet (500 mg total) by mouth 2 (two) times daily. ?  ?losartan 25 MG tablet ?Commonly known as: COZAAR ?Take 25 mg by mouth daily. ?  ?lubiprostone 24 MCG capsule ?Commonly known as: AMITIZA ?Take 24 mcg by mouth daily. ?  ?magnesium oxide 400 MG tablet ?Commonly known as: MAG-OX ?Take 400 mg by mouth daily. ?  ?metolazone 5 MG tablet ?Commonly known as: ZAROXOLYN ?Take 5 mg by mouth 3 (three) times a week. Sunday,Wednesday,friday ?  ?multivitamin with minerals Tabs tablet ?Take 1 tablet by mouth daily. ?Start taking on: Apr 30, 2022 ?  ?NovoLOG Mix 70/30 FlexPen (70-30) 100 UNIT/ML FlexPen ?Generic drug: insulin aspart protamine - aspart ?Inject into the skin daily as needed (High blood sugar). Sliding scale ?Per pt at preop appt pt

## 2022-04-29 NOTE — ED Notes (Signed)
This RN assisted patient ambulated to restroom; patient gait unsteady to ambulate on her own. ?

## 2022-04-29 NOTE — Progress Notes (Signed)
In regards to pt's ordered MRI. Pt struggled to remain still with involuntary motion, pt states this is normal when the stimulation is off (as SCS device was for exam per device safety guidelines), best possible images obtained, ONLY ABLE TO OBTAIN LIMITED STUDY- AX DWI, COR DWI, AX T2 FLAIR and AX MPGR, further imaging was non diagnostic due to frequent motion, several cushions used to no avail. Aborted scan and pt's device returned back to normal settings with remote by pt (out of MRI mode). Completed exam as a limited study. ?

## 2022-04-29 NOTE — Progress Notes (Signed)
FPTS Interim Progress Note ? ?S: Received page from RN stating that patient was anxious and still having difficulty with word finding. Patient reported to call RN frequently and not able to communicate needs clearly. RN also notes a rash beneath patient's pannus.  ?Evaluated patient at bedside. She states that she is at [facility] for having trouble sleeping. She denies being in pain. She is unable to state why she has been calling for help from nursing.  ?She reports having a rash that has a foul odor but is not pruritic nor painful.  ?She also reports having changes to the fourth toe on her left foot, she does not recall any recent injury, denies that the toe is painful ? ?O: ?BP (!) 170/76   Pulse 73   Temp 97.8 ?F (36.6 ?C) (Oral)   Resp 16   Wt 90.6 kg   SpO2 100%   BMI 32.24 kg/m?   ?Physical Exam  ?General: elderly female appearing stated age, lying in bed, anxious appearing, holding head and repeating questions  ?Pulm: Normal respiratory effort, stable on RA ?Abdomen: erythematous, moist appearing rash in intertriginous fold and extending into right groin area, no breaks in skin, foul odor  ?Extremities: No peripheral edema. Venous stasis changes noted on lower extremities, fourth toe on left foot with erythema and some bruising, non-tender, bilateral dorsalis pedis pulses and posterior tib pulses palpated  ?Neuro: patient follows commands, 5/5 strength in bilateral upper extremities, 5/5 in lower extremities, CN intact, PERRLA, EOMI bilaterally, patient oriented to self, location, year, president, delayed responses to questions regarding needs and is unable to communicate why she is in the hospital ? ?A/P: ?-Suspect abdominal rash is related to candida, will apply nystatin powder to area  ?-will administer melatonin to help with sleeping  ?-updated patient on condition and reason MRI was not completed ( due to concern for orientation)  ? ? ?Ronnald Ramp, MD ?04/29/2022, 3:29 AM ?PGY-3, [facility] ?Service pager [phone] ? ?

## 2022-04-29 NOTE — Discharge Instructions (Signed)
Dear Brenda Morgan, ? ?Thank you for letting us participate in your care. You were hospitalized for confusion, altered mental status, and altered speech. While we did not find the exact cause, we did rule out several dangerous things. We are calling this a transient confusional episode. The neurologist thinks this is most likely seizure activity. It is also possible you are having TIAs, but much less likely based on your story and exam. ? ?POST-HOSPITAL & CARE INSTRUCTIONS ?Continue your seizure medications. No changes at this time.  ?Your should follow up with your primary care doctor in about a week.  ?We urge you to discuss your multiple sedating medications with your doctors. Taking several highly sedating drugs at the same time can be dangerous.  ?Go to your follow up appointments (listed below) ? ? ?DOCTOR'S APPOINTMENT   ?Future Appointments  ?Date Time Provider Department Center  ?12/31/2022  1:45 PM Windell Norfolk, MD [facility] None  ? ? Follow-up Information   ? ? Medicine, [facility] Internal. Schedule an appointment as soon as possible for a visit in 1 week(s).   ?Why: Make a hospital follow up appointment with your primary care physician. Should be within about a week of hospital discharge. ?Contact information: ?[address] ?[address] ?[place] [address] ?[phone] ? ? ?  ?  ? ?  ?  ? ?  ? ? ?Take care and be well! ? ?Family Medicine Teaching Service Inpatient Team ?[facility]  ?[facility]  ?[address] [place], [place] [address] ?([phone] ?

## 2022-06-13 ENCOUNTER — Other Ambulatory Visit: Payer: Self-pay | Admitting: Family Medicine

## 2022-06-13 ENCOUNTER — Other Ambulatory Visit: Payer: Self-pay | Admitting: Neurology

## 2022-07-04 IMAGING — CT CT HEAD CODE STROKE
4 series · 16 of 47 positions shown, 18 images · non-contrast
Comparison: 10/13/2020

CLINICAL DATA: Code stroke. Neuro deficit, acute, stroke suspected.



[Series 3: head wo · axial · 0.42mm/px · z∈[-134,-14]mm · 7 of 32 slices shown, 9 images]
[im 4/32  brain]
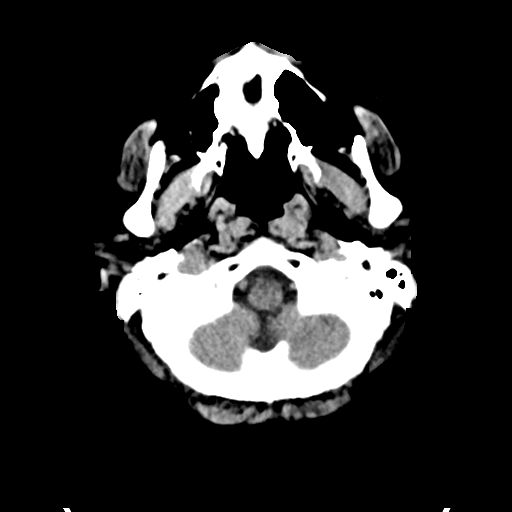
[im 4/32  bone]
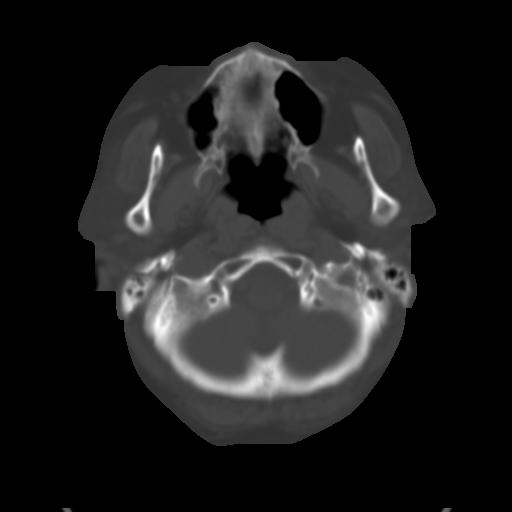
[im 8/32  brain]
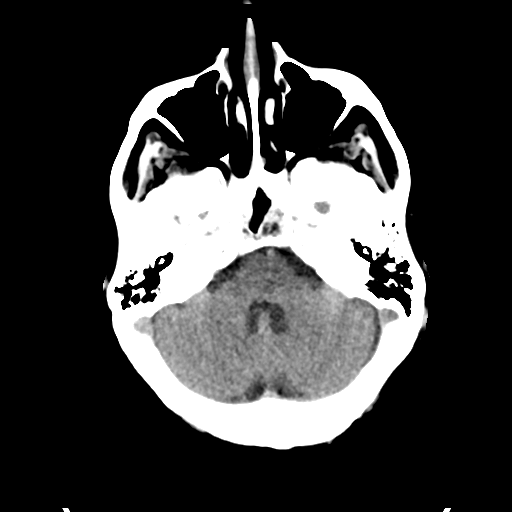
[im 12/32  brain]
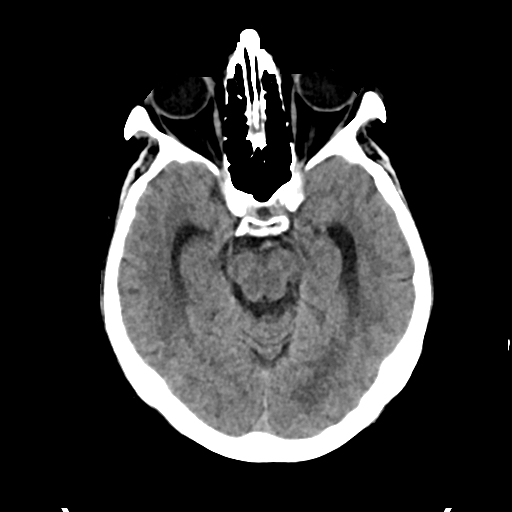
[im 16/32  brain]
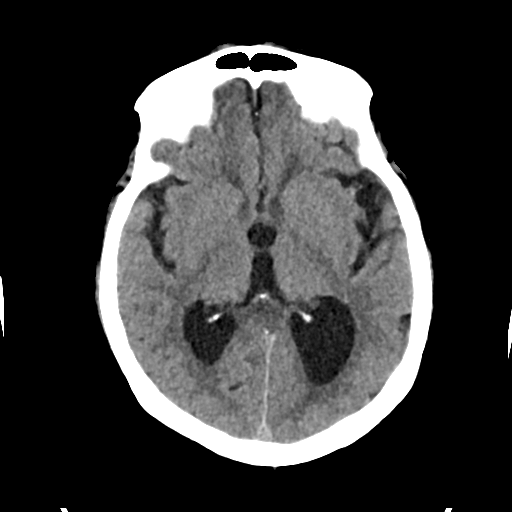
[im 20/32  brain]
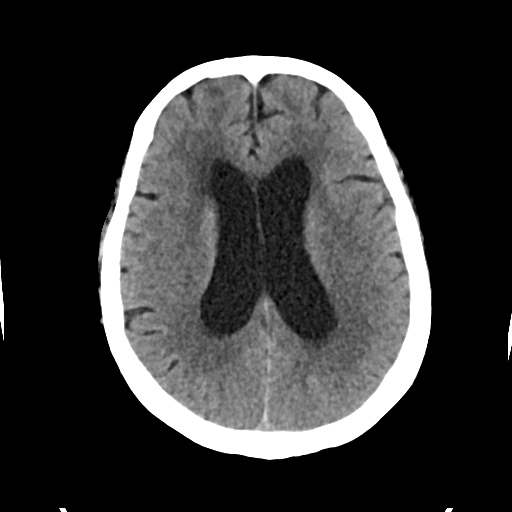
[im 20/32  bone]
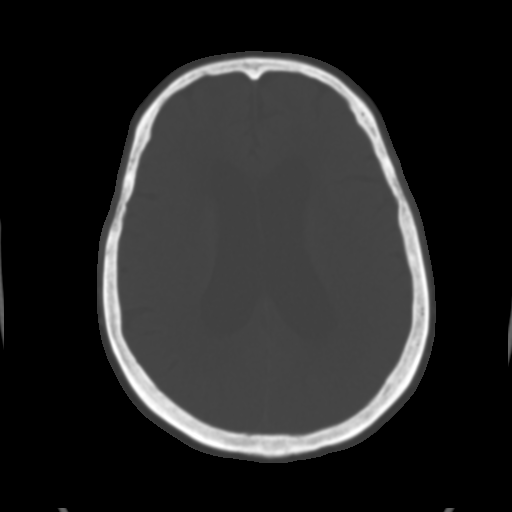
[im 24/32  brain]
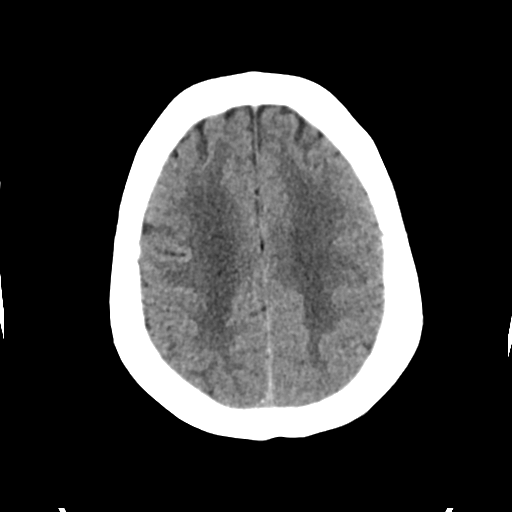
[im 28/32  brain]
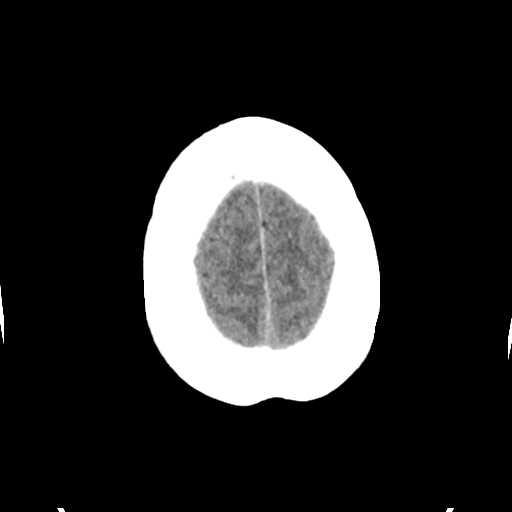

[Series 4: head bone · axial · 0.42mm/px · z∈[-134,-102]mm · 3 of 80 slices shown]
[im 8/80  bone]
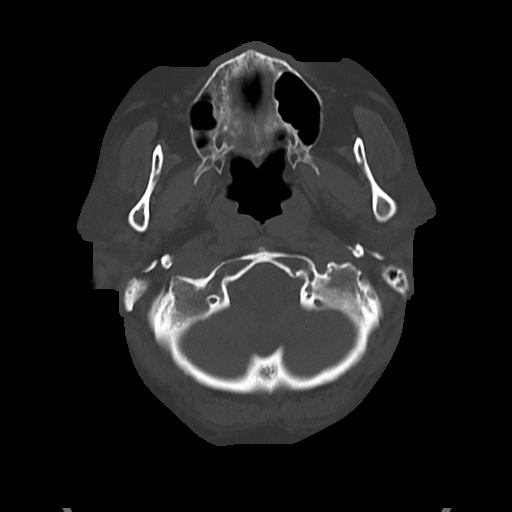
[im 16/80  bone]
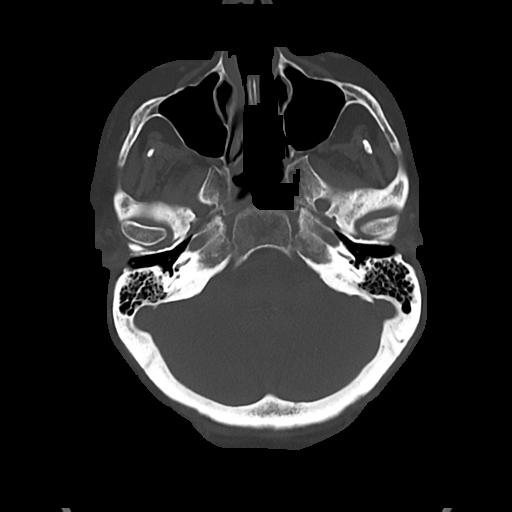
[im 24/80  bone]
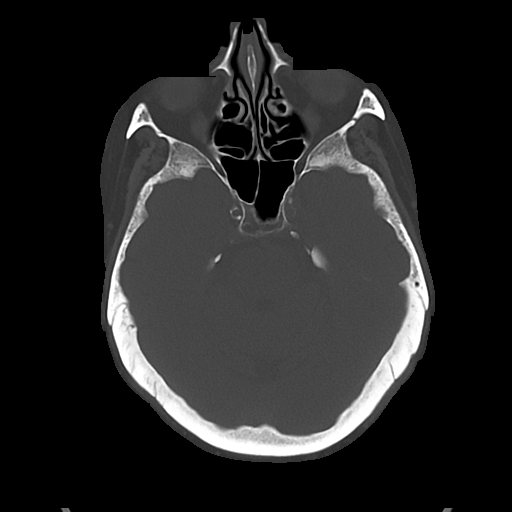

[Series 5: cor soft · coronal · 0.33mm/px · 3 of 66 slices shown]
[im 22/66  brain]
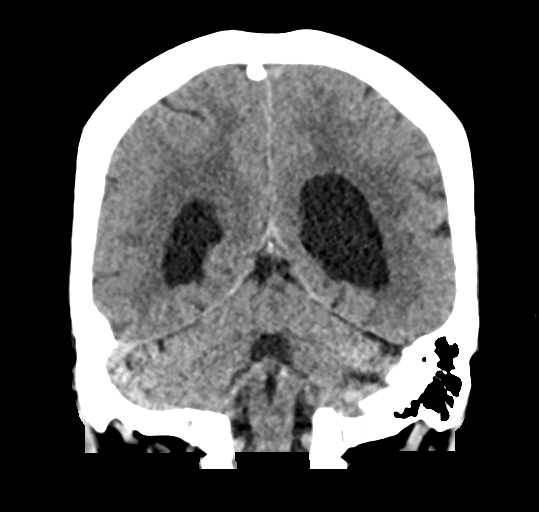
[im 29/66  brain]
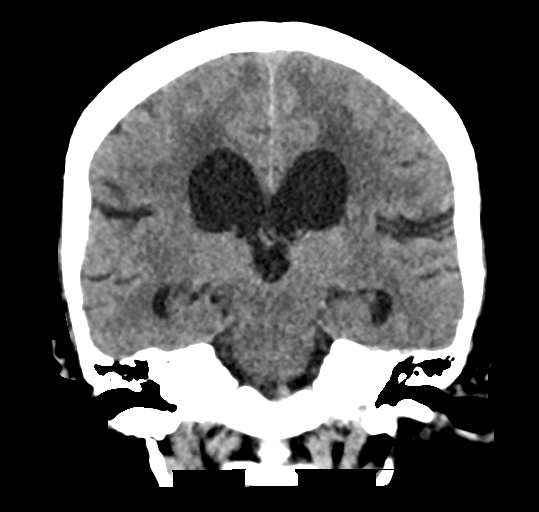
[im 37/66  brain]
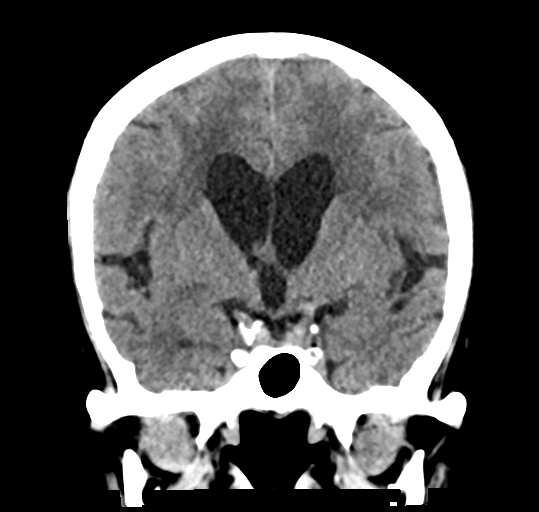

[Series 6: sag soft · sagittal · 0.33mm/px · 3 of 60 slices shown]
[im 20/60  brain]
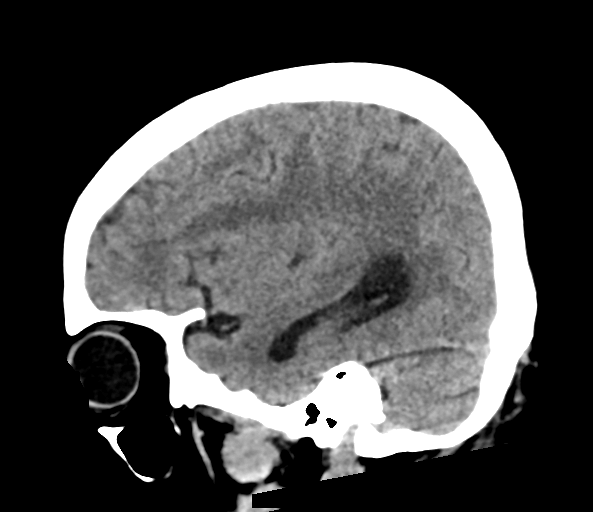
[im 30/60  brain]
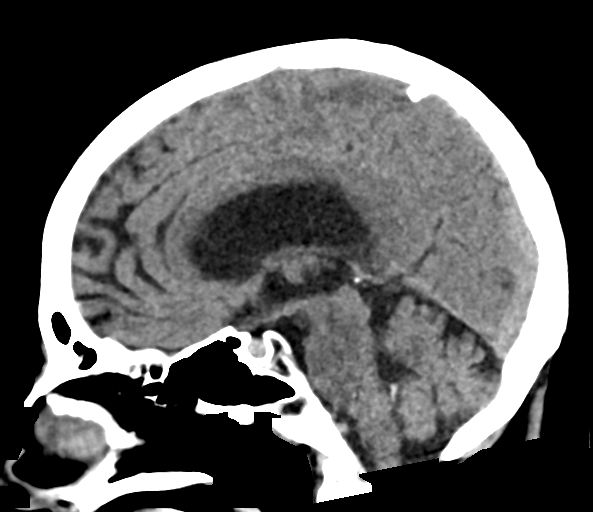
[im 40/60  brain]
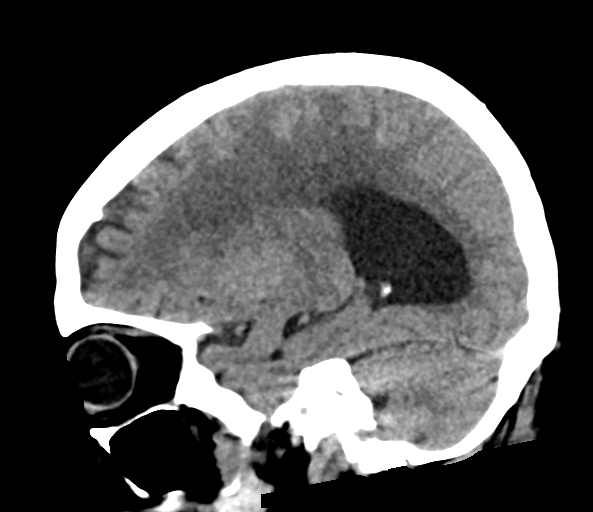

[16 of 47 positions shown; findings below may reference images not displayed]

FINDINGS: Brain: No sign of acute infarction. There are extensive chronic
small-vessel ischemic changes of the cerebral hemispheric white
matter. No evidence of mass, hemorrhage, hydrocephalus or
extra-axial collection.

Vascular: There is atherosclerotic calcification of the major
vessels at the base of the brain.

Skull: Negative

Sinuses/Orbits: Clear/normal

Other: None

ASPECTS (Alberta Stroke Program Early CT Score)

- Ganglionic level infarction (caudate, lentiform nuclei, internal
capsule, insula, M1-M3 cortex): 7

- Supraganglionic infarction (M4-M6 cortex): 3

Total score (0-10 with 10 being normal): 10
IMPRESSION: 1. Advanced chronic small-vessel ischemic changes of the white
matter. No sign of acute infarction.
2. ASPECTS is 10
3. These results were communicated to Dr. Konopny at [DATE] on
04/28/2022 by text page via the [employer] messaging system.

## 2022-07-13 ENCOUNTER — Emergency Department (HOSPITAL_COMMUNITY): Payer: BC Managed Care – PPO

## 2022-07-13 ENCOUNTER — Other Ambulatory Visit: Payer: Self-pay

## 2022-07-13 ENCOUNTER — Emergency Department (HOSPITAL_COMMUNITY)
Admission: EM | Admit: 2022-07-13 | Discharge: 2022-07-13 | Disposition: A | Discharge status: Home / Self Care | Payer: BC Managed Care – PPO | Attending: Emergency Medicine | Admitting: Emergency Medicine

## 2022-07-13 DIAGNOSIS — N189 Chronic kidney disease, unspecified: Secondary | ICD-10-CM | POA: Insufficient documentation

## 2022-07-13 DIAGNOSIS — E1122 Type 2 diabetes mellitus with diabetic chronic kidney disease: Secondary | ICD-10-CM | POA: Insufficient documentation

## 2022-07-13 DIAGNOSIS — W1839XA Other fall on same level, initial encounter: Secondary | ICD-10-CM | POA: Insufficient documentation

## 2022-07-13 DIAGNOSIS — R569 Unspecified convulsions: Secondary | ICD-10-CM | POA: Diagnosis not present

## 2022-07-13 DIAGNOSIS — J449 Chronic obstructive pulmonary disease, unspecified: Secondary | ICD-10-CM | POA: Diagnosis not present

## 2022-07-13 DIAGNOSIS — Z7982 Long term (current) use of aspirin: Secondary | ICD-10-CM | POA: Diagnosis not present

## 2022-07-13 DIAGNOSIS — I129 Hypertensive chronic kidney disease with stage 1 through stage 4 chronic kidney disease, or unspecified chronic kidney disease: Secondary | ICD-10-CM | POA: Diagnosis not present

## 2022-07-13 DIAGNOSIS — S0990XA Unspecified injury of head, initial encounter: Secondary | ICD-10-CM | POA: Diagnosis present

## 2022-07-13 DIAGNOSIS — Z79899 Other long term (current) drug therapy: Secondary | ICD-10-CM | POA: Insufficient documentation

## 2022-07-13 DIAGNOSIS — W19XXXA Unspecified fall, initial encounter: Secondary | ICD-10-CM

## 2022-07-13 DIAGNOSIS — S060XAA Concussion with loss of consciousness status unknown, initial encounter: Secondary | ICD-10-CM | POA: Diagnosis not present

## 2022-07-13 LAB — CBC WITH DIFFERENTIAL/PLATELET
Abs Immature Granulocytes: 0.03 10*3/uL (ref 0.00–0.07)
Basophils Absolute: 0 10*3/uL (ref 0.0–0.1)
Basophils Relative: 0 %
Eosinophils Absolute: 0 10*3/uL (ref 0.0–0.5)
Eosinophils Relative: 0 %
HCT: 44.8 % (ref 36.0–46.0)
Hemoglobin: 13.5 g/dL (ref 12.0–15.0)
Immature Granulocytes: 0 %
Lymphocytes Relative: 15 %
Lymphs Abs: 1.5 10*3/uL (ref 0.7–4.0)
MCH: 29.3 pg (ref 26.0–34.0)
MCHC: 30.1 g/dL (ref 30.0–36.0)
MCV: 97.2 fL (ref 80.0–100.0)
Monocytes Absolute: 0.7 10*3/uL (ref 0.1–1.0)
Monocytes Relative: 6 %
Neutro Abs: 7.9 10*3/uL — ABNORMAL HIGH (ref 1.7–7.7)
Neutrophils Relative %: 79 %
Platelets: 176 10*3/uL (ref 150–400)
RBC: 4.61 MIL/uL (ref 3.87–5.11)
RDW: 14.9 % (ref 11.5–15.5)
WBC: 10.2 10*3/uL (ref 4.0–10.5)
nRBC: 0 % (ref 0.0–0.2)

## 2022-07-13 LAB — URINALYSIS, ROUTINE W REFLEX MICROSCOPIC
Bilirubin Urine: NEGATIVE
Glucose, UA: NEGATIVE mg/dL
Hgb urine dipstick: NEGATIVE
Ketones, ur: NEGATIVE mg/dL
Leukocytes,Ua: NEGATIVE
Nitrite: NEGATIVE
Protein, ur: >=300 mg/dL — AB
Specific Gravity, Urine: 1.018 (ref 1.005–1.030)
pH: 7 (ref 5.0–8.0)

## 2022-07-13 LAB — COMPREHENSIVE METABOLIC PANEL
ALT: 16 U/L (ref 0–44)
AST: 19 U/L (ref 15–41)
Albumin: 3.3 g/dL — ABNORMAL LOW (ref 3.5–5.0)
Alkaline Phosphatase: 79 U/L (ref 38–126)
Anion gap: 12 (ref 5–15)
BUN: 18 mg/dL (ref 8–23)
CO2: 25 mmol/L (ref 22–32)
Calcium: 9.3 mg/dL (ref 8.9–10.3)
Chloride: 100 mmol/L (ref 98–111)
Creatinine, Ser: 1.09 mg/dL — ABNORMAL HIGH (ref 0.44–1.00)
GFR, Estimated: 57 mL/min — ABNORMAL LOW (ref >60)
Glucose, Bld: 139 mg/dL — ABNORMAL HIGH (ref 70–99)
Potassium: 4 mmol/L (ref 3.5–5.1)
Sodium: 137 mmol/L (ref 135–145)
Total Bilirubin: 1 mg/dL (ref 0.3–1.2)
Total Protein: 7.4 g/dL (ref 6.5–8.1)

## 2022-07-13 LAB — SALICYLATE LEVEL: Salicylate Lvl: <7 mg/dL — ABNORMAL LOW (ref 7.0–30.0)

## 2022-07-13 LAB — APTT: aPTT: 30 seconds (ref 24–36)

## 2022-07-13 LAB — PROTIME-INR
INR: 1.1 (ref 0.8–1.2)
Prothrombin Time: 13.6 seconds (ref 11.4–15.2)

## 2022-07-13 LAB — AMMONIA: Ammonia: <10 umol/L (ref 9–35)

## 2022-07-13 LAB — LIPASE, BLOOD: Lipase: 22 U/L (ref 11–51)

## 2022-07-13 LAB — ACETAMINOPHEN LEVEL: Acetaminophen (Tylenol), Serum: 11 ug/mL (ref 10–30)

## 2022-07-13 LAB — ETHANOL: Alcohol, Ethyl (B): <10 mg/dL (ref <10)

## 2022-07-13 LAB — CBG MONITORING, ED: Glucose-Capillary: 116 mg/dL — ABNORMAL HIGH (ref 70–99)

## 2022-07-13 LAB — LACTIC ACID, PLASMA: Lactic Acid, Venous: 1.3 mmol/L (ref 0.5–1.9)

## 2022-07-13 MED ORDER — LACTATED RINGERS IV BOLUS
1000.0000 mL | Freq: Once | INTRAVENOUS | Status: AC
  Administered 2022-07-13: 1000 mL via INTRAVENOUS

## 2022-07-13 MED ORDER — ACETAMINOPHEN 325 MG PO TABS
650.0000 mg | ORAL_TABLET | Freq: Once | ORAL | Status: AC
Start: 2022-07-13 — End: 2022-07-13
  Administered 2022-07-13: 650 mg via ORAL
  Filled 2022-07-13: qty 2

## 2022-07-13 MED ORDER — ONDANSETRON 4 MG PO TBDP
4.0000 mg | ORAL_TABLET | Freq: Once | ORAL | Status: AC
  Administered 2022-07-13: 4 mg via ORAL
  Filled 2022-07-13: qty 1

## 2022-07-13 MED ORDER — METOCLOPRAMIDE HCL 5 MG/ML IJ SOLN
10.0000 mg | Freq: Once | INTRAMUSCULAR | Status: AC
  Administered 2022-07-13: 10 mg via INTRAVENOUS
  Filled 2022-07-13: qty 2

## 2022-07-13 NOTE — ED Notes (Signed)
Ambulated pt around room. Pt complained of some light headedness at first standing. Pt then ambulated around with what appeared to be a normal gait which was confirmed by family member.

## 2022-07-13 NOTE — ED Provider Notes (Signed)
[facility] EMERGENCY DEPARTMENT Provider Note   CSN: [id number] Arrival date & time: 07/13/22  1406     History  Chief Complaint  Patient presents with   Fall    Brenda Morgan is a 66 y.o. female.  Pt is a 65y/o female with hx of seizures, HFpEF (EF 70% 2019), DM, HTN, COPD, CKD, PVD, NASH, chronic pain on fentanyl patch and oxycodone, past EtOH abuse, unintentional Tylenol OD 2012 who has had multiple admissions for AMS without definitive cause but thought to be possibly from sz who is presenting today with her husband due to AMS, head injury and n/v.  Her husband gives the history and reports that yesterday she seemed slightly confused yesterday evening but she does that sometimes and he was not too concerned.  However he works third shift and when he got home this morning he saw the tables turned over and it appeared that she had fallen.  He asked her if she fell and she denied it but he had noticed a small bump on her head.  He reports that he woke up at 10 and checked on her and the bruising was starting to get worse but she still seemed okay and then at 12 when he woke up she had bruising under both eyes and she was then moaning and not acting herself.  On the way here she started having vomiting and he reports it did not appear that she had eaten anything all day.  Patient is moaning in the room and will answer a question with yes or no but it is unclear if it is accurate.  She cannot follow commands consistently.  Her husband denies that she has had recent cough, congestion.  She did test positive for COVID approximately 1 month ago.  She has not had any change in her medications and he reports he controls all of her medications and there is no way she would have taken more unless she took additional insulin because that is the only thing she does herself.  The history is provided by the spouse and medical records.  Fall       Home Medications Prior to  Admission medications   Medication Sig Start Date End Date Taking? Authorizing Provider  acetaminophen (TYLENOL) 500 MG tablet Take 1 tablet (500 mg total) by mouth every 6 (six) hours as needed for mild pain or moderate pain. 04/29/22   Fayette Pho, MD  aspirin EC 81 MG tablet Take 1 tablet (81 mg total) by mouth 2 (two) times daily. For DVT prophylaxis for 30 days after surgery. Patient taking differently: Take 81 mg by mouth daily. For DVT prophylaxis for 30 days after surgery. 01/17/22   Jenne Pane, PA-C  atorvastatin (LIPITOR) 10 MG tablet Take 5 mg by mouth daily.    [provider]  azelastine (ASTELIN) 0.1 % nasal spray Place 2 sprays into both nostrils 2 (two) times daily as needed for rhinitis (congestion).  07/30/20   [provider]  carvedilol (COREG) 25 MG tablet Take 25 mg by mouth daily.    [provider]  cetirizine (ZYRTEC) 10 MG tablet Take 10 mg by mouth daily.    [provider]  COLLAGEN PO Take 1 tablet by mouth daily.    [provider]  Continuous Blood Gluc Sensor (FREESTYLE LIBRE 14 DAY SENSOR) MISC Apply topically every 14 (fourteen) days. 08/17/21   [provider]  Continuous Blood Gluc Sensor (FREESTYLE LIBRE 14 DAY SENSOR)  MISC APPLY 1 SENSOR EVERY 14 DAYS AS DIRECTED 02/24/21   [provider]  diclofenac Sodium (VOLTAREN) 1 % GEL Apply 4 g topically 4 (four) times daily. Patient not taking: Reported on 04/28/2022 09/23/20   Melene Plan, DO  DULoxetine (CYMBALTA) 60 MG capsule Take 60 mg by mouth daily. 03/09/22   [provider]  Empagliflozin-metFORMIN HCl ER (SYNJARDY XR) 25-1000 MG TB24 Take 1 tablet by mouth daily.    [provider]  famotidine (PEPCID) 40 MG tablet Take 40 mg by mouth at bedtime. 09/09/20   [provider]  fentaNYL (DURAGESIC) 50 MCG/HR Place 1 patch onto the skin every 3 (three) days. 09/15/21   [provider]  ferrous sulfate 325 (65 FE) MG  tablet Take 325 mg by mouth daily with breakfast.    [provider]  Fluticasone-Salmeterol (ADVAIR) 250-50 MCG/DOSE AEPB Inhale 1 puff into the lungs 2 (two) times daily as needed (shortness of breath/wheezing).     [provider]  furosemide (LASIX) 40 MG tablet Take 40 mg by mouth daily.    [provider]  hydrOXYzine (ATARAX/VISTARIL) 25 MG tablet Take 25 mg by mouth at bedtime. 07/30/20   [provider]  icosapent Ethyl (VASCEPA) 1 g capsule Take 2 g by mouth 2 (two) times daily.    [provider]  insulin aspart protamine - aspart (NOVOLOG MIX 70/30 FLEXPEN) (70-30) 100 UNIT/ML FlexPen Inject into the skin daily as needed (High blood sugar). Sliding scale Per pt at preop appt pt takes once a day in the am if glucose if greater than 90. 09/15/21   [provider]  lamoTRIgine (LAMICTAL) 150 MG tablet Take 1 tablet (150 mg total) by mouth at bedtime. Take with 25 for a total of 175 mg at bedtime 12/28/21 04/28/22  Windell Norfolk, MD  lamoTRIgine (LAMICTAL) 25 MG tablet Take 1 tablet (25 mg total) by mouth at bedtime. Take with 150 mg for a total of 175 mg at bedtime 12/28/21 04/28/22  Windell Norfolk, MD  levETIRAcetam (KEPPRA) 500 MG tablet Take 1 tablet (500 mg total) by mouth 2 (two) times daily. 12/28/21 04/28/22  Windell Norfolk, MD  losartan (COZAAR) 25 MG tablet Take 25 mg by mouth daily. 09/28/20   [provider]  lubiprostone (AMITIZA) 24 MCG capsule Take 24 mcg by mouth daily. 09/25/21   [provider]  magnesium oxide (MAG-OX) 400 MG tablet Take 400 mg by mouth daily.    [provider]  metolazone (ZAROXOLYN) 5 MG tablet Take 5 mg by mouth 3 (three) times a week. Sunday,Wednesday,friday    [provider]  Multiple Vitamin (MULTIVITAMIN WITH MINERALS) TABS tablet Take 1 tablet by mouth daily. 04/30/22   Fayette Pho, MD  nystatin (MYCOSTATIN/NYSTOP) powder Apply topically 2 (two) times daily. Apply to  affected skin folds twice daily until cleared. 04/29/22   Fayette Pho, MD  ondansetron (ZOFRAN-ODT) 4 MG disintegrating tablet Take 1 tablet (4 mg total) by mouth 2 (two) times daily as needed for nausea or vomiting. 01/17/22   Jenne Pane, PA-C  oxyCODONE-acetaminophen (PERCOCET) 10-325 MG tablet Take 1 tablet by mouth every 8 (eight) hours as needed for pain. 12/13/21   [provider]  OZEMPIC, 1 MG/DOSE, 4 MG/3ML SOPN Inject 1 mg into the skin once a week. Take on Sunday 09/14/20   [provider]  pantoprazole (PROTONIX) 40 MG tablet Take 40 mg by mouth daily.    [provider]  pioglitazone (ACTOS)  15 MG tablet Take 15 mg by mouth daily.    [provider]  potassium chloride SA (KLOR-CON) 20 MEQ tablet Take 20 mEq by mouth daily.    [provider]  pramipexole (MIRAPEX) 0.5 MG tablet Take 1 mg by mouth at bedtime.    [provider]  pregabalin (LYRICA) 200 MG capsule Take 200 mg by mouth in the morning, at noon, and at bedtime.    [provider]  spironolactone (ALDACTONE) 50 MG tablet Take 50 mg by mouth daily.    [provider]      Allergies    Amlodipine, Naltrexone, Penicillins, and Penicillin g    Review of Systems   Review of Systems  Physical Exam Updated Vital Signs BP (!) 150/60   Pulse 79   Temp 99.9 F (37.7 C) (Rectal)   Resp 18   SpO2 95%  Physical Exam Vitals and nursing note reviewed.  Constitutional:      General: She is in acute distress.     Appearance: She is well-developed.  HENT:     Head: Normocephalic. Raccoon eyes present.     Comments: Hematoma noted to the right forehead, Eyes:     Extraocular Movements: Extraocular movements intact.     Pupils: Pupils are equal, round, and reactive to light.  Cardiovascular:     Rate and Rhythm: Normal rate and regular rhythm.     Heart sounds: Normal heart sounds. No murmur heard.    No friction rub.  Pulmonary:     Effort:  Pulmonary effort is normal.     Breath sounds: Normal breath sounds. No wheezing or rales.  Abdominal:     General: Bowel sounds are normal. There is no distension.     Palpations: Abdomen is soft.     Tenderness: There is no abdominal tenderness. There is no guarding or rebound.  Musculoskeletal:        General: No tenderness. Normal range of motion.     Comments: No edema  Skin:    General: Skin is warm and dry.     Findings: No rash.  Neurological:     Mental Status: She is alert.     Cranial Nerves: No cranial nerve deficit.     Comments: Oriented to self only.  Patient cannot follow commands consistently but is noted to move all extremities.  Does not appear to have significant asterixis no pronator drift in the upper extremities but she will not hold her lower extremity up long enough to evaluate.  Psychiatric:     Comments: Distracted, having difficulty following commands.  Does not appear to be having hallucinations     ED Results / Procedures / Treatments   Labs (all labs ordered are listed, but only abnormal results are displayed) Labs Reviewed  CBC WITH DIFFERENTIAL/PLATELET - Abnormal; Notable for the following components:      Result Value   Neutro Abs 7.9 (*)    All other components within normal limits  COMPREHENSIVE METABOLIC PANEL - Abnormal; Notable for the following components:   Glucose, Bld 139 (*)    Creatinine, Ser 1.09 (*)    Albumin 3.3 (*)    GFR, Estimated 57 (*)    All other components within normal limits  URINALYSIS, ROUTINE W REFLEX MICROSCOPIC - Abnormal; Notable for the following components:   Protein, ur >=300 (*)    Bacteria, UA RARE (*)    All other components within normal limits  SALICYLATE LEVEL - Abnormal; Notable  for the following components:   Salicylate Lvl [id number] (*)    All other components within normal limits  CBG MONITORING, ED - Abnormal; Notable for the following components:   Glucose-Capillary 116 (*)    All other components  within normal limits  CULTURE, BLOOD (ROUTINE X 2)  CULTURE, BLOOD (ROUTINE X 2)  URINE CULTURE  LACTIC ACID, PLASMA  PROTIME-INR  APTT  ACETAMINOPHEN LEVEL  AMMONIA  LIPASE, BLOOD  ETHANOL  LEVETIRACETAM LEVEL  LACTIC ACID, PLASMA  CBG MONITORING, ED    EKG EKG Interpretation  Date/Time:  Friday July 13 2022 15:42:26 EDT Ventricular Rate:  78 PR Interval:  154 QRS Duration: 89 QT Interval:  371 QTC Calculation: 423 R Axis:   38 Text Interpretation: Sinus rhythm Normal ECG No significant change since last tracing Confirmed by Blanchie Dessert [id number]) on 07/13/2022 4:14:53 PM  Radiology DG Chest Port 1 View  Result Date: 07/13/2022 CLINICAL DATA:  Questionable sepsis EXAM: PORTABLE CHEST 1 VIEW COMPARISON:  Chest x-ray dated Apr 28, 2022 FINDINGS: The heart size and mediastinal contours are within normal limits. Mild bibasilar opacities which are likely due to atelectasis. Lungs otherwise clear. The visualized skeletal structures are unremarkable. Partially visualized spinal stimulator lead. IMPRESSION: No active disease. Electronically Signed   By: Yetta Glassman M.D.   On: 07/13/2022 16:15   CT Head Wo Contrast  Result Date: 07/13/2022 CLINICAL DATA:  Fall on the floor. Blunt facial and head and neck trauma. EXAM: CT HEAD WITHOUT CONTRAST CT MAXILLOFACIAL WITHOUT CONTRAST CT CERVICAL SPINE WITHOUT CONTRAST TECHNIQUE: Multidetector CT imaging of the head, cervical spine, and maxillofacial structures were performed using the standard protocol without intravenous contrast. Multiplanar CT image reconstructions of the cervical spine and maxillofacial structures were also generated. RADIATION DOSE REDUCTION: This exam was performed according to the departmental dose-optimization program which includes automated exposure control, adjustment of the mA and/or kV according to patient size and/or use of iterative reconstruction technique. COMPARISON:  CT head examination dated October 13, 2020 FINDINGS: CT HEAD FINDINGS Brain: No evidence of acute infarction, hemorrhage, hydrocephalus, extra-axial collection or mass lesion/mass effect. Low-attenuation of the periventricular white matter presumed chronic microvascular ischemic changes. Mild cerebral volume loss. Vascular: No hyperdense vessel or unexpected calcification. Skull: Normal. Negative for fracture or focal lesion. Other: None. CT MAXILLOFACIAL FINDINGS Patient motion limits evaluation. Osseous: No fracture or mandibular dislocation. No destructive process. Orbits: Negative. No traumatic or inflammatory finding. Sinuses: Clear. Soft tissues: Mild right forehead soft tissue swelling without hematoma or fluid collection. CT CERVICAL SPINE FINDINGS Alignment: Straightening of the cervical spine. Skull base and vertebrae: No acute fracture. No primary bone lesion or focal pathologic process. Soft tissues and spinal canal: No prevertebral fluid or swelling. No visible canal hematoma. Disc levels: No significant degenerate disc disease, spinal canal/neural foraminal stenosis. Upper chest: Negative. Other: None IMPRESSION: CT head: 1.  No acute intracranial abnormality. 2. Mild cerebral volume loss and chronic microvascular ischemic changes of the white matter, unchanged. CT maxillofacial: 1.  No acute fracture or dislocation. 2.  No acute orbital injury. 3. Mild right forehead soft tissue swelling without evidence of hematoma or fluid collection. CT cervical spine: 1.  No acute fracture or traumatic subluxation. 2.  No significant soft tissue injury. Electronically Signed   By: Keane Police D.O.   On: 07/13/2022 15:40   CT Cervical Spine Wo Contrast  Result Date: 07/13/2022 CLINICAL DATA:  Fall on the floor. Blunt facial and head and neck trauma. EXAM:  CT HEAD WITHOUT CONTRAST CT MAXILLOFACIAL WITHOUT CONTRAST CT CERVICAL SPINE WITHOUT CONTRAST TECHNIQUE: Multidetector CT imaging of the head, cervical spine, and maxillofacial structures were  performed using the standard protocol without intravenous contrast. Multiplanar CT image reconstructions of the cervical spine and maxillofacial structures were also generated. RADIATION DOSE REDUCTION: This exam was performed according to the departmental dose-optimization program which includes automated exposure control, adjustment of the mA and/or kV according to patient size and/or use of iterative reconstruction technique. COMPARISON:  CT head examination dated October 13, 2020 FINDINGS: CT HEAD FINDINGS Brain: No evidence of acute infarction, hemorrhage, hydrocephalus, extra-axial collection or mass lesion/mass effect. Low-attenuation of the periventricular white matter presumed chronic microvascular ischemic changes. Mild cerebral volume loss. Vascular: No hyperdense vessel or unexpected calcification. Skull: Normal. Negative for fracture or focal lesion. Other: None. CT MAXILLOFACIAL FINDINGS Patient motion limits evaluation. Osseous: No fracture or mandibular dislocation. No destructive process. Orbits: Negative. No traumatic or inflammatory finding. Sinuses: Clear. Soft tissues: Mild right forehead soft tissue swelling without hematoma or fluid collection. CT CERVICAL SPINE FINDINGS Alignment: Straightening of the cervical spine. Skull base and vertebrae: No acute fracture. No primary bone lesion or focal pathologic process. Soft tissues and spinal canal: No prevertebral fluid or swelling. No visible canal hematoma. Disc levels: No significant degenerate disc disease, spinal canal/neural foraminal stenosis. Upper chest: Negative. Other: None IMPRESSION: CT head: 1.  No acute intracranial abnormality. 2. Mild cerebral volume loss and chronic microvascular ischemic changes of the white matter, unchanged. CT maxillofacial: 1.  No acute fracture or dislocation. 2.  No acute orbital injury. 3. Mild right forehead soft tissue swelling without evidence of hematoma or fluid collection. CT cervical spine: 1.  No  acute fracture or traumatic subluxation. 2.  No significant soft tissue injury. Electronically Signed   By: Larose Hires D.O.   On: 07/13/2022 15:40   CT Maxillofacial Wo Contrast  Result Date: 07/13/2022 CLINICAL DATA:  Fall on the floor. Blunt facial and head and neck trauma. EXAM: CT HEAD WITHOUT CONTRAST CT MAXILLOFACIAL WITHOUT CONTRAST CT CERVICAL SPINE WITHOUT CONTRAST TECHNIQUE: Multidetector CT imaging of the head, cervical spine, and maxillofacial structures were performed using the standard protocol without intravenous contrast. Multiplanar CT image reconstructions of the cervical spine and maxillofacial structures were also generated. RADIATION DOSE REDUCTION: This exam was performed according to the departmental dose-optimization program which includes automated exposure control, adjustment of the mA and/or kV according to patient size and/or use of iterative reconstruction technique. COMPARISON:  CT head examination dated October 13, 2020 FINDINGS: CT HEAD FINDINGS Brain: No evidence of acute infarction, hemorrhage, hydrocephalus, extra-axial collection or mass lesion/mass effect. Low-attenuation of the periventricular white matter presumed chronic microvascular ischemic changes. Mild cerebral volume loss. Vascular: No hyperdense vessel or unexpected calcification. Skull: Normal. Negative for fracture or focal lesion. Other: None. CT MAXILLOFACIAL FINDINGS Patient motion limits evaluation. Osseous: No fracture or mandibular dislocation. No destructive process. Orbits: Negative. No traumatic or inflammatory finding. Sinuses: Clear. Soft tissues: Mild right forehead soft tissue swelling without hematoma or fluid collection. CT CERVICAL SPINE FINDINGS Alignment: Straightening of the cervical spine. Skull base and vertebrae: No acute fracture. No primary bone lesion or focal pathologic process. Soft tissues and spinal canal: No prevertebral fluid or swelling. No visible canal hematoma. Disc levels: No  significant degenerate disc disease, spinal canal/neural foraminal stenosis. Upper chest: Negative. Other: None IMPRESSION: CT head: 1.  No acute intracranial abnormality. 2. Mild cerebral volume loss and chronic microvascular ischemic changes  of the white matter, unchanged. CT maxillofacial: 1.  No acute fracture or dislocation. 2.  No acute orbital injury. 3. Mild right forehead soft tissue swelling without evidence of hematoma or fluid collection. CT cervical spine: 1.  No acute fracture or traumatic subluxation. 2.  No significant soft tissue injury. Electronically Signed   By: Keane Police D.O.   On: 07/13/2022 15:40    Procedures Procedures    Medications Ordered in ED Medications  ondansetron (ZOFRAN-ODT) disintegrating tablet 4 mg (4 mg Oral Given 07/13/22 1447)  acetaminophen (TYLENOL) tablet 650 mg (650 mg Oral Given 07/13/22 1447)  metoCLOPramide (REGLAN) injection 10 mg (10 mg Intravenous Given 07/13/22 1608)  lactated ringers bolus 1,000 mL (0 mLs Intravenous Stopped 07/13/22 1933)    ED Course/ Medical Decision Making/ A&P                           Medical Decision Making Amount and/or Complexity of Data Reviewed Independent Historian: spouse External Data Reviewed: notes.    Details: prior hospitalization Labs: ordered. Decision-making details documented in ED Course. Radiology: ordered and independent interpretation performed. Decision-making details documented in ED Course. ECG/medicine tests: ordered and independent interpretation performed. Decision-making details documented in ED Course.  Risk Prescription drug management.   Pt with multiple medical problems and comorbidities and presenting today with a complaint that caries a high risk for morbidity and mortality.  Here today with multiple complaints.  Patient does appear to have had a fall and has trauma noted to the face has been vomiting and is now altered.  Possibility for concussion versus intracranial hemorrhage.   However patient has also been seen multiple times and admitted for altered mental status.  It is never being completely clear what has caused her altered mental status but could be seizure versus medications.  Patient has a normal blood sugar here and low suspicion that this is hypoglycemia.  Also patient's oral temperature is 100.6 and concern for possible sepsis and infectious cause for her altered mental status.  She seems encephalopathic at this time and low suspicion for stroke.  She is not displaying any specific seizure-like activity at this time.  Patient's husband gives the history as well as external medical records from her most recent hospitalization in June.  Her husband also reports that she in no way could have had extra medication other than what he has given her which is what is prescribed.  Her fentanyl patch was changed on Thursday and she has been on that for quite some time.  Onion differentiated sepsis order set initiated.  I have independently visualized and interpreted pt's images today.  Head CT was atrophy but no evidence of intracranial hemorrhage.  Cervical spine was negative for fracture and facial CT negative for broken bones.  CXR wnl. I independently interpreted patient's EKG and labs.  EKG without acute findings today.  Labs are reassuring with a normal CBC, UA, CMP, lactic acid, INR, PTT, salicylate, acetaminophen and ammonia levels.  Lipase and EtOH are negative.   On reevaluation after headache cocktail patient seems to be more lucid and at her baseline.  Her husband is at bedside and discussed the findings with both of them.  Suspect that patient definitely has a concussion from hitting her head but also concerned that she may be having seizures at home.  She is compliant with her medications but her husband reports recently she went off the seizure medication because when they  saw the neurologist they were not sure what was going on and she had been started on medication for  first-time seizure.  Discussed with him the need to follow-up with neurology but at this time with shared decision making they were given the option of going home versus staying for observation.  Patient reports that she feels that she can stand and walk and she would like to go home.  She was able to walk with the nurse and husband feels comfortable taking her home.  They were given return precautions.  On rectal temperature her temperature was 99.9 and repeat oral temperature was 98.8.  Unclear if the temperature of 100.6 was accurate.          Final Clinical Impression(s) / ED Diagnoses Final diagnoses:  Fall, initial encounter  Concussion with unknown loss of consciousness status, initial encounter  Seizure-like activity [facility])    Rx / DC Orders ED Discharge Orders     None         Blanchie Dessert, MD 07/13/22 2119

## 2022-07-13 NOTE — ED Provider Triage Note (Signed)
Emergency Medicine Provider Triage Evaluation Note  Brenda Morgan , a 66 y.o. female  was evaluated in triage.  Pt presenting after a fall. Husband reports he came home from work this morning and patient was sprawled on floor with small bruise to forehead. She is usually alert and oriented but she has not been this morning. He gave her percocet at 8am and tylenol at 10 but she started vomiting on her way here.   Review of Systems  Positive: Headache, confusion Negative:   Physical Exam  BP (!) 163/83 (BP Location: Right Arm)   Pulse 79   Resp (!) 24   SpO2 99%  Gen:   Awake, no distress   Resp:  Normal effort  MSK:   Moves extremities without difficulty  Other:  Moaning, but following commands. 5/5 finger grip bilaterally. PERRLA. Bruising to right brow and knot to right forehead. No facial droop.  Medical Decision Making  Medically screening exam initiated at 2:17 PM.  Appropriate orders placed.  Jaedynn Bohlken was informed that the remainder of the evaluation will be completed by another provider, this initial triage assessment does not replace that evaluation, and the importance of remaining in the ED until their evaluation is complete.   Imaging and labs ordered. Normally she is A&O and responsive but currently just moaning. Nursing notified she needs next room   Saddie Benders, PA-C 07/13/22 1444

## 2022-07-13 NOTE — Discharge Instructions (Signed)
Thankfully there is no evidence of any internal injury to the brain or broken bones in the face.  However you unfortunately have a concussion.  That can cause headaches, nausea and vomiting.  Concerned that you may have had seizure today that caused this.  Is very important that you follow-up with a neurologist.  All the blood work today looks normal however in the meantime if your symptoms worsen or you start developing high fever, you cannot hold anything down, new pain or inability to walk return to the emergency room.

## 2022-07-13 NOTE — ED Triage Notes (Signed)
Pt comes in with complaint of fall.  Pt has bruising noted to right forehead.  Pt unable to answer questions appropriately.  Unknown pt's baseline mental status.

## 2022-07-14 LAB — URINE CULTURE: Culture: NO GROWTH

## 2022-07-16 LAB — LEVETIRACETAM LEVEL: Levetiracetam Lvl: <2 ug/mL — ABNORMAL LOW (ref 10.0–40.0)

## 2022-07-17 ENCOUNTER — Ambulatory Visit (INDEPENDENT_AMBULATORY_CARE_PROVIDER_SITE_OTHER): Payer: BC Managed Care – PPO | Admitting: Neurology

## 2022-07-17 ENCOUNTER — Encounter: Payer: Self-pay | Admitting: Neurology

## 2022-07-17 VITALS — BP 167/73 | HR 80 | Ht 66.0 in | Wt 204.0 lb

## 2022-07-17 DIAGNOSIS — G40909 Epilepsy, unspecified, not intractable, without status epilepticus: Secondary | ICD-10-CM | POA: Diagnosis not present

## 2022-07-17 DIAGNOSIS — R269 Unspecified abnormalities of gait and mobility: Secondary | ICD-10-CM

## 2022-07-17 MED ORDER — LAMOTRIGINE 200 MG PO TBDP: 200.0000 mg | ORAL_TABLET | Freq: Every day | ORAL | 12 refills | Status: DC

## 2022-07-17 NOTE — Patient Instructions (Signed)
Increase Lamotrigine to 200 mg daily  Continue your other medications  Referral to physical therapy for gait training  Return in 6 months or sooner if worse

## 2022-07-17 NOTE — Progress Notes (Signed)
[facility]  PATIENT: Brenda Morgan DOB: 12-26-1955  REFERRING CLINICIAN: Sheral Apley, MD HISTORY FROM: Patient and husband  REASON FOR VISIT: New onset Seizure    HISTORICAL  CHIEF COMPLAINT:  Chief Complaint  Patient presents with   Consult    Magali Bray 2023/Paper/Murphy Wainer/Timothy Murphy MD  Here for balance issues, using walker    INTERVAL HISTORY 07/17/22 Patient presents today for follow-up, last visit was in January, at that time plan was to continue with Keppra and to proceed with her knee surgery.  Since then she had her knee surgery done but had multiple visits to the hospital due to recurrent falls.  Work-up so far has been unrevealing and fall etiology is unclear possibly multifactorial.  In terms of her seizures she denies any seizure-like activity, actually she discontinued the Keppra because she did not feel like she was having seizures.  She is on lamotrigine 175 mg nightly and she is using it for mood.  Her last fall per husband was yesterday and this is due to her patient losing her balance.  She has gait instability, uses a walker, on top of that she is on multiple medications for chronic pain including 600 mg of Lyrica, oxycodone, fentanyl patch and a spinal stimulator.   INTERVAL HISTORY 12/28/2021: Patient present today for follow-up with husband, last visit was on October 5, at that time plan was to continue Keppra 500 mg twice a day and recheck a level.  The Keppra level came back normal, 23.3.  Brain MRI was completed and showed no acute intracranial abnormality.  She denies any seizures since last visit and report compliance with medication.  She still pending left knee surgery scheduled for at the end of the month, currently state that she is in a lot of pain.  She is also on a lot of pain medication, does complain of daytime somnolence and occasional headaches, but no seizures.  She is currently not driving.   HISTORY OF PRESENT  ILLNESS:  This is a 66 year old woman with extended medical history including fibromyalgia, chronic pain, recurrent UTIs, CKD, chronic liver cirrhosis, hyperlipidemia, hypertension, CHF, obstructive sleep apnea on BiPAP, GERD, prediabetes who is presenting with seizure-like activity 2 weeks ago.  Per husband patient was laying on the bed then he had a growling noise, he went to check on her and she was jerking her head back-and-forth, eyes rolled back, not breathing and foam coming out of the mouth.  Husband said that he put his fingers in patient mouth to put back her tongue.  He called EMS when EMS arrived patient had a second event very similar with head-bobbing, jerking involving the head and arms, episode lasting more than 2 minutes.  He was taken to the local hospital where she had a head CT which was negative and a routine EEG who was reported to be normal. She was also found to be have a lactic acid up to 5.1.She was started on 500 mg twice daily Keppra.  She was in the hospital for a total of 3 days. Husband states that she was confused in the first 2 days of admission.  Since being discharged from the hospital, patient does not have any seizure-like activity.  She denies any previous history of seizures, denies any family history of seizures, denies any history of head trauma and denies overall any seizure risk factor.  She is on lamotrigine for mood.    Handedness: Right   Seizure Type: Generalized  Current frequency: First time seizure  Any injuries from seizures: None   Seizure risk factors: None reported, no strokes,no head trauma   Previous ASMs: Levetiracetam    Currenty ASMs: Lamotrigine   ASMs side effects: None   Brain Images: CT head normal   Previous EEGs: No abnormalities    OTHER MEDICAL CONDITIONS: Fibromyalgia, chronic pain, recurrent UTIs, CKD, chronic liver cirrhosis, hyperlipidemia, hypertension, CHF, obstructive sleep apnea on BiPAP, GERD, prediabetes  REVIEW  OF SYSTEMS: Full 14 system review of systems performed and negative with exception of: as noted in the HPI.   ALLERGIES: Allergies  Allergen Reactions   Amlodipine Anaphylaxis, Swelling and Other (See Comments)    Tongue swelling    Naltrexone Hives   Penicillins Hives and Other (See Comments)    Tolerated ceftriaxone and cefepime 06/2017 Hives     Penicillin G     HOME MEDICATIONS: Outpatient Medications Prior to Visit  Medication Sig Dispense Refill   acetaminophen (TYLENOL) 500 MG tablet Take 1 tablet (500 mg total) by mouth every 6 (six) hours as needed for mild pain or moderate pain. 60 tablet 0   aspirin EC 81 MG tablet Take 1 tablet (81 mg total) by mouth 2 (two) times daily. For DVT prophylaxis for 30 days after surgery. (Patient taking differently: Take 81 mg by mouth daily. For DVT prophylaxis for 30 days after surgery.) 60 tablet 0   atorvastatin (LIPITOR) 10 MG tablet Take 5 mg by mouth daily.     azelastine (ASTELIN) 0.1 % nasal spray Place 2 sprays into both nostrils 2 (two) times daily as needed for rhinitis (congestion).      carvedilol (COREG) 25 MG tablet Take 25 mg by mouth daily.     cetirizine (ZYRTEC) 10 MG tablet Take 10 mg by mouth daily.     COLLAGEN PO Take 1 tablet by mouth daily.     Continuous Blood Gluc Sensor (FREESTYLE LIBRE 14 DAY SENSOR) MISC Apply topically every 14 (fourteen) days.     Continuous Blood Gluc Sensor (FREESTYLE LIBRE 14 DAY SENSOR) MISC APPLY 1 SENSOR EVERY 14 DAYS AS DIRECTED     diclofenac Sodium (VOLTAREN) 1 % GEL Apply 4 g topically 4 (four) times daily. 100 g 0   DULoxetine (CYMBALTA) 60 MG capsule Take 60 mg by mouth daily.     Empagliflozin-metFORMIN HCl ER (SYNJARDY XR) 25-1000 MG TB24 Take 1 tablet by mouth daily.     famotidine (PEPCID) 40 MG tablet Take 40 mg by mouth at bedtime.     fentaNYL (DURAGESIC) 50 MCG/HR Place 1 patch onto the skin every 3 (three) days.     ferrous sulfate 325 (65 FE) MG tablet Take 325 mg by mouth  daily with breakfast.     Fluticasone-Salmeterol (ADVAIR) 250-50 MCG/DOSE AEPB Inhale 1 puff into the lungs 2 (two) times daily as needed (shortness of breath/wheezing).      furosemide (LASIX) 40 MG tablet Take 40 mg by mouth daily.     hydrOXYzine (ATARAX/VISTARIL) 25 MG tablet Take 25 mg by mouth at bedtime.     icosapent Ethyl (VASCEPA) 1 g capsule Take 2 g by mouth 2 (two) times daily.     insulin aspart protamine - aspart (NOVOLOG MIX 70/30 FLEXPEN) (70-30) 100 UNIT/ML FlexPen Inject into the skin daily as needed (High blood sugar). Sliding scale Per pt at preop appt pt takes once a day in the am if glucose if greater than 90.     losartan (COZAAR) 25  MG tablet Take 25 mg by mouth daily.     lubiprostone (AMITIZA) 24 MCG capsule Take 24 mcg by mouth daily.     magnesium oxide (MAG-OX) 400 MG tablet Take 400 mg by mouth daily.     metolazone (ZAROXOLYN) 5 MG tablet Take 5 mg by mouth 3 (three) times a week. Sunday,Wednesday,friday     Multiple Vitamin (MULTIVITAMIN WITH MINERALS) TABS tablet Take 1 tablet by mouth daily.     nystatin (MYCOSTATIN/NYSTOP) powder Apply topically 2 (two) times daily. Apply to affected skin folds twice daily until cleared. 60 g 0   ondansetron (ZOFRAN-ODT) 4 MG disintegrating tablet Take 1 tablet (4 mg total) by mouth 2 (two) times daily as needed for nausea or vomiting. 10 tablet 0   oxyCODONE-acetaminophen (PERCOCET) 10-325 MG tablet Take 1 tablet by mouth every 8 (eight) hours as needed for pain.     OZEMPIC, 1 MG/DOSE, 4 MG/3ML SOPN Inject 1 mg into the skin once a week. Take on Sunday     pantoprazole (PROTONIX) 40 MG tablet Take 40 mg by mouth daily.     pioglitazone (ACTOS) 15 MG tablet Take 15 mg by mouth daily.     potassium chloride SA (KLOR-CON) 20 MEQ tablet Take 20 mEq by mouth daily.     pramipexole (MIRAPEX) 0.5 MG tablet Take 1 mg by mouth at bedtime.     pregabalin (LYRICA) 200 MG capsule Take 200 mg by mouth in the morning, at noon, and at  bedtime.     spironolactone (ALDACTONE) 50 MG tablet Take 50 mg by mouth daily.     lamoTRIgine (LAMICTAL) 150 MG tablet Take 1 tablet (150 mg total) by mouth at bedtime. Take with 25 for a total of 175 mg at bedtime 30 tablet 0   lamoTRIgine (LAMICTAL) 25 MG tablet Take 1 tablet (25 mg total) by mouth at bedtime. Take with 150 mg for a total of 175 mg at bedtime 30 tablet 0   levETIRAcetam (KEPPRA) 500 MG tablet Take 1 tablet (500 mg total) by mouth 2 (two) times daily. 180 tablet 4   No facility-administered medications prior to visit.    PAST MEDICAL HISTORY: Past Medical History:  Diagnosis Date   Acute metabolic encephalopathy    Acute respiratory failure with hypoxia and hypercapnia (HCC)    AKI (acute kidney injury) ([place]) 09/15/2020   Altered mental status    Anxiety    Arthritis    Cerebral atrophy, Head CT ([place]) 09/23/2020   CHF (congestive heart failure) (HCC)    Chronic pain 04/29/2022   COPD (chronic obstructive pulmonary disease) ([place])    Diabetes mellitus without complication ([place])    DM II (diabetes mellitus, type II), controlled ([place]) 08/01/2020   Fatty liver 2012   per pt 06/24/20   GERD (gastroesophageal reflux disease)    Heart murmur    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 Patient hospitalized in December 18-22, 2014 at [facility] for hypercapnia and hepatic encephalopathy.    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[place]) 08/02/2020   Lumbar radiculopathy 07/06/2020   Obesity (BMI 30.0-34.9) 04/29/2022   Peripheral vascular disease ([place])    Pneumonia due to COVID-19  virus    Postoperative wound infection 07/31/2020   S/P  total knee arthroplasty, left 01/16/2022   Seizure disorder ([place]) 04/29/2022   Seizures ([place])    Shock ([place])    Sleep apnea    Spinal cord stimulator present    Toxic encephalopathy 04/28/2022    PAST SURGICAL HISTORY: Past Surgical History:  Procedure Laterality Date   BACK SURGERY     carotid artery surgery Right 10/07/2012   carotid ultrasound  06/2015   CHOLECYSTECTOMY  1981   COLONOSCOPY  2014   DIAGNOSTIC MAMMOGRAM  03/2017   groin lypnoid Left 03/2015   LUMBAR WOUND DEBRIDEMENT N/A 08/02/2020   Procedure: IRRIGATE AND DEBRIDEMENT OF LUMBAR WOUND, PLACEMENT OF WOUND VAC;  Surgeon: Dawley, Theodoro Doing, DO;  Location: [place];  Service: Neurosurgery;  Laterality: N/A;   right heel spur  1994   SPINAL CORD STIMULATOR INSERTION N/A 05/03/2021   Procedure: Spinal Cord Stimulator Placement;  Surgeon: Reece Agar, MD;  Location: [place];  Service: Neurosurgery;  Laterality: N/A;   TOTAL KNEE ARTHROPLASTY Left 01/16/2022   Procedure: TOTAL KNEE ARTHROPLASTY;  Surgeon: Renette Butters, MD;  Location: [facility];  Service: Orthopedics;  Laterality: Left;   TUBAL LIGATION  1985   wrist trigger release Right 01/2016    FAMILY HISTORY: Family History  Problem Relation Age of Onset   Diabetes Mother    Heart disease Mother    Stomach cancer Mother    Diabetes Father    Heart disease Father    Lung cancer Father     SOCIAL HISTORY: Social History   Socioeconomic History   Marital status: Married    Spouse name: Jeneen Rinks   Number of children: Not on file   Years of education: Not on file   Highest education level: Not on file  Occupational History   Occupation: Disabled    Comment: disabled due to depression, fibromyalgia, and chronic pain issues  Tobacco Use   Smoking status: Former    Packs/day: 1.00    Types: Cigarettes    Quit date: 04/23/2021    Years since quitting: 1.2   Smokeless tobacco: Never  Vaping Use   Vaping Use: Never used  Substance and Sexual Activity   Alcohol use:  Never   Drug use: Never   Sexual activity: Not on file  Other Topics Concern   Not on file  Social History Narrative   Lives with husband   left Handed   Drinks caffeine rarely   Social Determinants of Health   Financial Resource Strain: Not on file  Food Insecurity: Not on file  Transportation Needs: Not on file  Physical Activity: Not on file  Stress: Not on file  Social Connections: Not on file  Intimate Partner Violence: Not on file     PHYSICAL EXAM  GENERAL EXAM/CONSTITUTIONAL: Vitals:  Vitals:   07/17/22 1508  BP: (!) 167/73  Pulse: 80  Weight: 204 lb (92.5 kg)  Height: 5\' 6"  (1.676 m)    Body mass index is 32.93 kg/m. Wt Readings from Last 3 Encounters:  07/17/22 204 lb (92.5 kg)  04/28/22 199 lb 11.8 oz (90.6 kg)  01/16/22 211 lb 8 oz (95.9 kg)   Patient is in no distress; well developed, nourished and groomed; neck is supple  EYES: Pupils round and reactive to light, Visual fields full to confrontation, Extraocular movements intacts,  No results found.  MUSCULOSKELETAL: Gait, strength, tone, movements noted in Neurologic exam below  NEUROLOGIC: MENTAL STATUS:  No data to display         awake, alert, oriented to person, place and time recent and remote memory intact normal attention and concentration language fluent, comprehension intact, naming intact fund of knowledge appropriate  CRANIAL NERVE:  2nd, 3rd, 4th, 6th - pupils equal and reactive to light, visual fields full to confrontation, extraocular muscles intact, no nystagmus 5th - facial sensation symmetric 7th - facial strength symmetric 8th - hearing intact 9th - palate elevates symmetrically, uvula midline 11th - shoulder shrug symmetric 12th - tongue protrusion midline  MOTOR:  normal bulk and tone, full strength in the BUE, BLE  SENSORY:  Decrease sensation to pinprick, vibration and proprioception to both feet  GAIT/STATION:  Able to stand unassisted, positive  [place], walks with a walker     DIAGNOSTIC DATA (LABS, IMAGING, TESTING) - I reviewed patient records, labs, notes, testing and imaging myself where available.  Lab Results  Component Value Date   WBC 10.2 07/13/2022   HGB 13.5 07/13/2022   HCT 44.8 07/13/2022   MCV 97.2 07/13/2022   PLT 176 07/13/2022      Component Value Date/Time   NA 137 07/13/2022 1430   K 4.0 07/13/2022 1430   CL 100 07/13/2022 1430   CO2 25 07/13/2022 1430   GLUCOSE 139 (H) 07/13/2022 1430   BUN 18 07/13/2022 1430   CREATININE 1.09 (H) 07/13/2022 1430   CALCIUM 9.3 07/13/2022 1430   PROT 7.4 07/13/2022 1430   ALBUMIN 3.3 (L) 07/13/2022 1430   AST 19 07/13/2022 1430   ALT 16 07/13/2022 1430   ALKPHOS 79 07/13/2022 1430   BILITOT 1.0 07/13/2022 1430   GFRNONAA 57 (L) 07/13/2022 1430   GFRAA >60 09/23/2020 1328   No results found for: "CHOL", "HDL", "LDLCALC", "LDLDIRECT", "TRIG" Lab Results  Component Value Date   HGBA1C 5.8 (H) 01/03/2022   No results found for: "VITAMINB12" Lab Results  Component Value Date   TSH 0.744 04/29/2022   Keppra level 23.3  Head CT [facility]): No acute intracranial abnormalities   Routine EEG: Reported normal.   Brain MRI 10/18/21 1. No acute intracranial abnormality. 2. Generalized volume loss and findings of chronic small vessel disease.   CT head 07/13/22 1.  No acute intracranial abnormality. 2. Mild cerebral volume loss and chronic microvascular ischemic changes of the white matter, unchanged.    ASSESSMENT AND PLAN  66 y.o. year old female  with Fibromyalgia, chronic pain, recurrent UTIs, CKD, chronic liver cirrhosis, hyperlipidemia, hypertension, CHF, obstructive sleep apnea on BiPAP, GERD, prediabetes here for follow up for her seizure and recurrent falls.  In terms of her seizures, she denies any recurrent seizures and actually self discontinued her levetiracetam.  However she is on lamotrigine 175 mg for mood.  At this point I am going to  increase the lamotrigine to 200 mg nightly.  She denies any additional seizures or seizure-like activity. She has recurrent falls and gait abnormality.  Inform patient and husband that her falls are likely multifactorial due to the fact that she has peripheral neuropathy, deconditioning and also she is on multiple medications that have drowsiness as a side effect including high-dose Lyrica, Norco, fentanyl patch I will refer patient to physical therapy for gait training, advised her to continue using her walker as much as possible and to return in 6 months or sooner if worse.  They voiced understanding.   1. Seizure disorder ([place])   2. Gait abnormality      Patient Instructions  Increase Lamotrigine to 200 mg daily  Continue your other medications  Referral to physical therapy for gait training  Return in 6 months or sooner if worse     Per [facility] statutes, patients with seizures are not allowed to drive until they have been seizure-free for six months.  Other recommendations include using caution when using heavy equipment or power tools. Avoid working on ladders or at heights. Take showers instead of baths.  Do not swim alone.  Ensure the water temperature is not too high on the home water heater. Do not go swimming alone. Do not lock yourself in a room alone (i.e. bathroom). When caring for infants or small children, sit down when holding, feeding, or changing them to minimize risk of injury to the child in the event you have a seizure. Maintain good sleep hygiene. Avoid alcohol.  Also recommend adequate sleep, hydration, good diet and minimize stress.   During the Seizure  - First, ensure adequate ventilation and place patients on the floor on their left side  Loosen clothing around the neck and ensure the airway is patent. If the patient is clenching the teeth, do not force the mouth open with any object as this can cause severe damage - Remove all items from the surrounding  that can be hazardous. The patient may be oblivious to what's happening and may not even know what he or she is doing. If the patient is confused and wandering, either gently guide him/her away and block access to outside areas - Reassure the individual and be comforting - Call 911. In most cases, the seizure ends before EMS arrives. However, there are cases when seizures may last over 3 to 5 minutes. Or the individual may have developed breathing difficulties or severe injuries. If a pregnant patient or a person with diabetes develops a seizure, it is prudent to call an ambulance. - Finally, if the patient does not regain full consciousness, then call EMS. Most patients will remain confused for about 45 to 90 minutes after a seizure, so you must use judgment in calling for help. - Avoid restraints but make sure the patient is in a bed with padded side rails - Place the individual in a lateral position with the neck slightly flexed; this will help the saliva drain from the mouth and prevent the tongue from falling backward - Remove all nearby furniture and other hazards from the area - Provide verbal assurance as the individual is regaining consciousness - Provide the patient with privacy if possible - Call for help and start treatment as ordered by the caregiver   After the Seizure (Postictal Stage)  After a seizure, most patients experience confusion, fatigue, muscle pain and/or a headache. Thus, one should permit the individual to sleep. For the next few days, reassurance is essential. Being calm and helping reorient the person is also of importance.  Most seizures are painless and end spontaneously. Seizures are not harmful to others but can lead to complications such as stress on the lungs, brain and the heart. Individuals with prior lung problems may develop labored breathing and respiratory distress.     Orders Placed This Encounter  Procedures   Ambulatory referral to Physical Therapy      Meds ordered this encounter  Medications   lamoTRIgine 200 MG TBDP    Sig: Take 1 tablet (200 mg total) by mouth daily.    Dispense:  60 tablet    Refill:  12  Return in about 6 months (around 01/17/2023).  I have spent a total of 45 minutes dedicated to this patient today, preparing to see patient, performing a medically appropriate examination and evaluation, ordering tests and/or medications and procedures, and counseling and educating the patient/family/caregiver; independently interpreting result and communicating results to the family/patient/caregiver; and documenting clinical information in the electronic medical record.   Windell Norfolk, MD 07/17/2022, 4:08 PM  [facility] [address] [place], [place] [address] [phone]

## 2022-07-18 LAB — CULTURE, BLOOD (ROUTINE X 2): Culture: NO GROWTH

## 2022-07-19 ENCOUNTER — Telehealth: Payer: Self-pay | Admitting: Neurology

## 2022-07-19 NOTE — Telephone Encounter (Signed)
Referral for Physical Therapy sent to [facility] [phone].

## 2022-08-04 DIAGNOSIS — Z22322 Carrier or suspected carrier of Methicillin resistant Staphylococcus aureus: Secondary | ICD-10-CM | POA: Insufficient documentation

## 2022-08-28 ENCOUNTER — Encounter: Payer: Self-pay | Admitting: Neurology

## 2022-12-04 ENCOUNTER — Telehealth: Payer: Self-pay | Admitting: Neurology

## 2022-12-04 NOTE — Telephone Encounter (Signed)
Pt's husband called stating that his wife had two major seizures and was in the hosp. and was informed that she should have her appt moved sooner. There was nothing available sooner and they would like to know if the pt can be fitted in.

## 2022-12-04 NOTE — Telephone Encounter (Signed)
Will monitor for a sooner appt.

## 2022-12-06 NOTE — Telephone Encounter (Signed)
Called pt husband. Informed him that we have added pt to our waiting list and he will be notified as soon as something comes available.

## 2022-12-31 ENCOUNTER — Telehealth: Payer: Self-pay | Admitting: Neurology

## 2022-12-31 ENCOUNTER — Ambulatory Visit: Payer: BC Managed Care – PPO | Admitting: Neurology

## 2022-12-31 NOTE — Telephone Encounter (Signed)
Pt has been added to wait list.

## 2022-12-31 NOTE — Telephone Encounter (Signed)
Pt's husband, Brenda Morgan Was admitted to hospital on 12/01/22 for seizures and discharged 12/03/22. Hospital prescribe Divalproex 500 mg twice a day, Levetiracetam 1000 mg twice a day. Having severe confusion and balance issues and getting worse. Seen PCP 12/27/22; recommended to see neurologist. Would like a call from the nurse.

## 2023-01-03 ENCOUNTER — Emergency Department (HOSPITAL_COMMUNITY): Payer: BC Managed Care – PPO

## 2023-01-03 ENCOUNTER — Encounter (HOSPITAL_COMMUNITY): Payer: Self-pay | Admitting: Emergency Medicine

## 2023-01-03 ENCOUNTER — Other Ambulatory Visit: Payer: Self-pay

## 2023-01-03 ENCOUNTER — Inpatient Hospital Stay (HOSPITAL_COMMUNITY)
Admission: EM | Admit: 2023-01-03 | Discharge: 2023-01-11 | DRG: 091 | Disposition: A | Discharge status: Skilled Nursing Facility | Payer: BC Managed Care – PPO | Attending: Internal Medicine | Admitting: Internal Medicine

## 2023-01-03 DIAGNOSIS — Z79891 Long term (current) use of opiate analgesic: Secondary | ICD-10-CM

## 2023-01-03 DIAGNOSIS — R801 Persistent proteinuria, unspecified: Secondary | ICD-10-CM | POA: Diagnosis present

## 2023-01-03 DIAGNOSIS — M797 Fibromyalgia: Secondary | ICD-10-CM | POA: Diagnosis present

## 2023-01-03 DIAGNOSIS — F419 Anxiety disorder, unspecified: Secondary | ICD-10-CM | POA: Diagnosis present

## 2023-01-03 DIAGNOSIS — L03116 Cellulitis of left lower limb: Secondary | ICD-10-CM | POA: Diagnosis present

## 2023-01-03 DIAGNOSIS — D696 Thrombocytopenia, unspecified: Secondary | ICD-10-CM

## 2023-01-03 DIAGNOSIS — I5033 Acute on chronic diastolic (congestive) heart failure: Secondary | ICD-10-CM | POA: Diagnosis present

## 2023-01-03 DIAGNOSIS — R600 Localized edema: Secondary | ICD-10-CM

## 2023-01-03 DIAGNOSIS — Z79899 Other long term (current) drug therapy: Secondary | ICD-10-CM

## 2023-01-03 DIAGNOSIS — R030 Elevated blood-pressure reading, without diagnosis of hypertension: Secondary | ICD-10-CM | POA: Diagnosis not present

## 2023-01-03 DIAGNOSIS — Y92009 Unspecified place in unspecified non-institutional (private) residence as the place of occurrence of the external cause: Secondary | ICD-10-CM

## 2023-01-03 DIAGNOSIS — Z7982 Long term (current) use of aspirin: Secondary | ICD-10-CM

## 2023-01-03 DIAGNOSIS — G4730 Sleep apnea, unspecified: Secondary | ICD-10-CM | POA: Diagnosis present

## 2023-01-03 DIAGNOSIS — T50995A Adverse effect of other drugs, medicaments and biological substances, initial encounter: Secondary | ICD-10-CM | POA: Diagnosis present

## 2023-01-03 DIAGNOSIS — E119 Type 2 diabetes mellitus without complications: Secondary | ICD-10-CM

## 2023-01-03 DIAGNOSIS — R82998 Other abnormal findings in urine: Secondary | ICD-10-CM

## 2023-01-03 DIAGNOSIS — G928 Other toxic encephalopathy: Principal | ICD-10-CM | POA: Diagnosis present

## 2023-01-03 DIAGNOSIS — G4733 Obstructive sleep apnea (adult) (pediatric): Secondary | ICD-10-CM | POA: Diagnosis present

## 2023-01-03 DIAGNOSIS — R739 Hyperglycemia, unspecified: Secondary | ICD-10-CM

## 2023-01-03 DIAGNOSIS — R531 Weakness: Secondary | ICD-10-CM

## 2023-01-03 DIAGNOSIS — E1151 Type 2 diabetes mellitus with diabetic peripheral angiopathy without gangrene: Secondary | ICD-10-CM | POA: Diagnosis present

## 2023-01-03 DIAGNOSIS — R0689 Other abnormalities of breathing: Secondary | ICD-10-CM | POA: Diagnosis present

## 2023-01-03 DIAGNOSIS — Z9689 Presence of other specified functional implants: Secondary | ICD-10-CM

## 2023-01-03 DIAGNOSIS — I152 Hypertension secondary to endocrine disorders: Secondary | ICD-10-CM | POA: Diagnosis present

## 2023-01-03 DIAGNOSIS — Z96652 Presence of left artificial knee joint: Secondary | ICD-10-CM | POA: Diagnosis present

## 2023-01-03 DIAGNOSIS — R569 Unspecified convulsions: Secondary | ICD-10-CM

## 2023-01-03 DIAGNOSIS — N189 Chronic kidney disease, unspecified: Secondary | ICD-10-CM | POA: Diagnosis present

## 2023-01-03 DIAGNOSIS — E1122 Type 2 diabetes mellitus with diabetic chronic kidney disease: Secondary | ICD-10-CM

## 2023-01-03 DIAGNOSIS — E1159 Type 2 diabetes mellitus with other circulatory complications: Secondary | ICD-10-CM | POA: Diagnosis present

## 2023-01-03 DIAGNOSIS — I872 Venous insufficiency (chronic) (peripheral): Secondary | ICD-10-CM

## 2023-01-03 DIAGNOSIS — E1165 Type 2 diabetes mellitus with hyperglycemia: Secondary | ICD-10-CM | POA: Diagnosis present

## 2023-01-03 DIAGNOSIS — Z8673 Personal history of transient ischemic attack (TIA), and cerebral infarction without residual deficits: Secondary | ICD-10-CM

## 2023-01-03 DIAGNOSIS — R4182 Altered mental status, unspecified: Secondary | ICD-10-CM | POA: Diagnosis present

## 2023-01-03 DIAGNOSIS — F40240 Claustrophobia: Secondary | ICD-10-CM | POA: Diagnosis present

## 2023-01-03 DIAGNOSIS — I10 Essential (primary) hypertension: Secondary | ICD-10-CM

## 2023-01-03 DIAGNOSIS — Z8616 Personal history of COVID-19: Secondary | ICD-10-CM

## 2023-01-03 DIAGNOSIS — Z91199 Patient's noncompliance with other medical treatment and regimen due to unspecified reason: Secondary | ICD-10-CM

## 2023-01-03 DIAGNOSIS — Z1152 Encounter for screening for COVID-19: Secondary | ICD-10-CM

## 2023-01-03 DIAGNOSIS — R296 Repeated falls: Secondary | ICD-10-CM | POA: Diagnosis present

## 2023-01-03 DIAGNOSIS — G9340 Encephalopathy, unspecified: Secondary | ICD-10-CM

## 2023-01-03 DIAGNOSIS — K219 Gastro-esophageal reflux disease without esophagitis: Secondary | ICD-10-CM | POA: Diagnosis present

## 2023-01-03 DIAGNOSIS — K7581 Nonalcoholic steatohepatitis (NASH): Secondary | ICD-10-CM | POA: Diagnosis present

## 2023-01-03 DIAGNOSIS — Z87891 Personal history of nicotine dependence: Secondary | ICD-10-CM

## 2023-01-03 DIAGNOSIS — E785 Hyperlipidemia, unspecified: Secondary | ICD-10-CM | POA: Diagnosis present

## 2023-01-03 DIAGNOSIS — G2581 Restless legs syndrome: Secondary | ICD-10-CM | POA: Diagnosis present

## 2023-01-03 DIAGNOSIS — Z880 Allergy status to penicillin: Secondary | ICD-10-CM

## 2023-01-03 DIAGNOSIS — J449 Chronic obstructive pulmonary disease, unspecified: Secondary | ICD-10-CM | POA: Diagnosis present

## 2023-01-03 DIAGNOSIS — F03A0 Unspecified dementia, mild, without behavioral disturbance, psychotic disturbance, mood disturbance, and anxiety: Secondary | ICD-10-CM | POA: Diagnosis present

## 2023-01-03 DIAGNOSIS — E8809 Other disorders of plasma-protein metabolism, not elsewhere classified: Secondary | ICD-10-CM | POA: Diagnosis not present

## 2023-01-03 DIAGNOSIS — E662 Morbid (severe) obesity with alveolar hypoventilation: Secondary | ICD-10-CM | POA: Diagnosis present

## 2023-01-03 DIAGNOSIS — G8929 Other chronic pain: Secondary | ICD-10-CM | POA: Diagnosis present

## 2023-01-03 DIAGNOSIS — K7400 Hepatic fibrosis, unspecified: Secondary | ICD-10-CM | POA: Diagnosis present

## 2023-01-03 DIAGNOSIS — W19XXXA Unspecified fall, initial encounter: Secondary | ICD-10-CM | POA: Diagnosis present

## 2023-01-03 DIAGNOSIS — G40909 Epilepsy, unspecified, not intractable, without status epilepticus: Secondary | ICD-10-CM

## 2023-01-03 DIAGNOSIS — Z8249 Family history of ischemic heart disease and other diseases of the circulatory system: Secondary | ICD-10-CM

## 2023-01-03 DIAGNOSIS — Z7984 Long term (current) use of oral hypoglycemic drugs: Secondary | ICD-10-CM

## 2023-01-03 DIAGNOSIS — Z888 Allergy status to other drugs, medicaments and biological substances status: Secondary | ICD-10-CM

## 2023-01-03 DIAGNOSIS — R0902 Hypoxemia: Secondary | ICD-10-CM | POA: Diagnosis present

## 2023-01-03 DIAGNOSIS — L0390 Cellulitis, unspecified: Secondary | ICD-10-CM | POA: Diagnosis present

## 2023-01-03 DIAGNOSIS — I110 Hypertensive heart disease with heart failure: Secondary | ICD-10-CM | POA: Diagnosis present

## 2023-01-03 DIAGNOSIS — Z6834 Body mass index (BMI) 34.0-34.9, adult: Secondary | ICD-10-CM

## 2023-01-03 DIAGNOSIS — L03115 Cellulitis of right lower limb: Secondary | ICD-10-CM | POA: Diagnosis present

## 2023-01-03 DIAGNOSIS — Z833 Family history of diabetes mellitus: Secondary | ICD-10-CM

## 2023-01-03 DIAGNOSIS — R32 Unspecified urinary incontinence: Secondary | ICD-10-CM | POA: Diagnosis present

## 2023-01-03 LAB — COMPREHENSIVE METABOLIC PANEL
ALT: 13 U/L (ref 0–44)
AST: 13 U/L — ABNORMAL LOW (ref 15–41)
Albumin: 2.4 g/dL — ABNORMAL LOW (ref 3.5–5.0)
Alkaline Phosphatase: 79 U/L (ref 38–126)
Anion gap: 8 (ref 5–15)
BUN: 22 mg/dL (ref 8–23)
CO2: 28 mmol/L (ref 22–32)
Calcium: 8.4 mg/dL — ABNORMAL LOW (ref 8.9–10.3)
Chloride: 98 mmol/L (ref 98–111)
Creatinine, Ser: 1.21 mg/dL — ABNORMAL HIGH (ref 0.44–1.00)
GFR, Estimated: 50 mL/min — ABNORMAL LOW (ref >60)
Glucose, Bld: 267 mg/dL — ABNORMAL HIGH (ref 70–99)
Potassium: 3.5 mmol/L (ref 3.5–5.1)
Sodium: 134 mmol/L — ABNORMAL LOW (ref 135–145)
Total Bilirubin: 0.5 mg/dL (ref 0.3–1.2)
Total Protein: 6.2 g/dL — ABNORMAL LOW (ref 6.5–8.1)

## 2023-01-03 LAB — RESP PANEL BY RT-PCR (RSV, FLU A&B, COVID)  RVPGX2
Influenza A by PCR: NEGATIVE
Influenza B by PCR: NEGATIVE
Resp Syncytial Virus by PCR: NEGATIVE
SARS Coronavirus 2 by RT PCR: NEGATIVE

## 2023-01-03 LAB — URINALYSIS, ROUTINE W REFLEX MICROSCOPIC
Bilirubin Urine: NEGATIVE
Glucose, UA: 150 mg/dL — AB
Ketones, ur: NEGATIVE mg/dL
Leukocytes,Ua: NEGATIVE
Nitrite: NEGATIVE
Protein, ur: >=300 mg/dL — AB
Specific Gravity, Urine: 1.025 (ref 1.005–1.030)
pH: 5 (ref 5.0–8.0)

## 2023-01-03 LAB — CBC WITH DIFFERENTIAL/PLATELET
Abs Immature Granulocytes: 0.07 10*3/uL (ref 0.00–0.07)
Basophils Absolute: 0 10*3/uL (ref 0.0–0.1)
Basophils Relative: 1 %
Eosinophils Absolute: 0.1 10*3/uL (ref 0.0–0.5)
Eosinophils Relative: 3 %
HCT: 34.3 % — ABNORMAL LOW (ref 36.0–46.0)
Hemoglobin: 10.7 g/dL — ABNORMAL LOW (ref 12.0–15.0)
Immature Granulocytes: 1 %
Lymphocytes Relative: 31 %
Lymphs Abs: 1.6 10*3/uL (ref 0.7–4.0)
MCH: 28.9 pg (ref 26.0–34.0)
MCHC: 31.2 g/dL (ref 30.0–36.0)
MCV: 92.7 fL (ref 80.0–100.0)
Monocytes Absolute: 0.8 10*3/uL (ref 0.1–1.0)
Monocytes Relative: 15 %
Neutro Abs: 2.6 10*3/uL (ref 1.7–7.7)
Neutrophils Relative %: 49 %
Platelets: 132 10*3/uL — ABNORMAL LOW (ref 150–400)
RBC: 3.7 MIL/uL — ABNORMAL LOW (ref 3.87–5.11)
RDW: 14 % (ref 11.5–15.5)
WBC: 5.2 10*3/uL (ref 4.0–10.5)
nRBC: 0 % (ref 0.0–0.2)

## 2023-01-03 LAB — BRAIN NATRIURETIC PEPTIDE: B Natriuretic Peptide: 117.9 pg/mL — ABNORMAL HIGH (ref 0.0–100.0)

## 2023-01-03 LAB — AMMONIA: Ammonia: 26 umol/L (ref 9–35)

## 2023-01-03 LAB — VALPROIC ACID LEVEL: Valproic Acid Lvl: 23 ug/mL — ABNORMAL LOW (ref 50.0–100.0)

## 2023-01-03 MED ORDER — CEPHALEXIN 500 MG PO CAPS
500.0000 mg | ORAL_CAPSULE | Freq: Four times a day (QID) | ORAL | 0 refills | Status: DC
Start: 2023-01-03 — End: 2023-01-10

## 2023-01-03 MED ORDER — CEFAZOLIN SODIUM-DEXTROSE 1-4 GM/50ML-% IV SOLN
1.0000 g | Freq: Once | INTRAVENOUS | Status: AC
  Administered 2023-01-04: 1 g via INTRAVENOUS
  Filled 2023-01-03: qty 50

## 2023-01-03 MED ORDER — CEPHALEXIN 500 MG PO CAPS: 500.0000 mg | ORAL_CAPSULE | Freq: Once | ORAL | Status: DC

## 2023-01-03 NOTE — ED Notes (Signed)
Patient transported to CT 

## 2023-01-03 NOTE — ED Provider Triage Note (Signed)
Emergency Medicine Provider Triage Evaluation Note  Brenda Morgan , a 67 y.o. female  was evaluated in triage.  Pt complains of lower extremity swelling, shortness of breath, frequent falls, confusion.  Significant other is present and states that she started to antiseizure medications approximately 1 month ago and has not been the same since.  Patient fell twice today.  She fell backwards and hit her head 1 time.  Did not lose consciousness.  States she does not take any blood thinners.  Has a history of CHF with as needed Lasix.  Has been taking daily for the past week but her lower extremities continue to swell.  She is having weeping sores due to the swelling.  Has home oxygen which she uses as needed for COPD.  Has not been using it lately because she has not felt like she has needed it and has had normal pulse ox readings at home 96 to 100%.  Review of Systems  Positive: As above Negative: As above  Physical Exam  BP (!) 169/100   Pulse 97   Temp 98.4 F (36.9 C)   Resp 16   SpO2 (!) 73%  Gen:   Awake, no distress   Resp:  Normal effort, no wheezing MSK:   Moves extremities without difficulty  Other:  Significant lower extremity edema with weeping sores  Medical Decision Making  Medically screening exam initiated at 7:37 PM.  Appropriate orders placed.  Brenda Morgan was informed that the remainder of the evaluation will be completed by another provider, this initial triage assessment does not replace that evaluation, and the importance of remaining in the ED until their evaluation is complete.  Initially hypoxic to 75% in triage.  Patient was started on 2 L nasal cannula and improved to 100%.  Labs and imaging ordered.  Patient will be brought back to room   Roylene Reason, [place] 01/03/23 1941

## 2023-01-03 NOTE — ED Triage Notes (Signed)
Per Pt and visitor, pt has had several falls today, one when she fell back and hit her head.  Pt is very SOB in triage, 76% on RA, placed on 2L [place] which improved to 98% (pt is on home O2 "sometimes.")  Pt's legs are very swollen and family reports she has been confused "quite often."  Also reports burning w/ urination.

## 2023-01-03 NOTE — ED Provider Notes (Signed)
67 yo F with a chief complaints of progressive weakness.  Increased falls at home.  Altered mental status.  I see the patient in signout from Dr. Ashok Cordia.  Patient noted to be hypoxic here as well.  Patient was up for discharge and I was notified by nursing that they had attempted to stand her up and were unable.  Also acutely confused.   Deno Etienne, DO 01/03/23 2340

## 2023-01-03 NOTE — Discharge Instructions (Addendum)
Monitor your weight. Weight yourself every day. If your weight increases more than 2-3 pounds in 24 hours take a dose of lasix. You can also take lasix if you develops worsening Lower extremities edema.   Use your Oxygen all the time and bed time.   You need to follow up with your Neurologist.

## 2023-01-03 NOTE — ED Provider Notes (Addendum)
[facility] EMERGENCY DEPARTMENT Provider Note   CSN: [id number] Arrival date & time: 01/03/23  1857     History  Chief Complaint  Patient presents with   Weakness    Brenda Morgan is a 67 y.o. female.  Pt with general weakness in the past two weeks. Spouse indicates had two seizures ~ 3 weeks ago, was hospitalized in [place]. Indicates prior hx seizure. Keppra and depakote doses adjusted then.  Spouse notes since then has been generally weak. Had fall in bedroom this AM - he helped to bed. No loc. No seizure noted. No current faintness or dizziness. At baseline walks w walker. Bil legs chronically swollen, perhaps a bit worse in past week. Denies headache. No cough, sore throat or uri symptoms. No fever or chills. No neck or back pain. No chest pain or sob. No abd pain or nvd. Normal appetite. No dysuria. Urinating normal amount. Denies other change in meds.   The history is provided by the patient, medical records and the spouse.  Shortness of Breath Associated symptoms: no abdominal pain, no chest pain, no cough, no fever, no headaches, no neck pain, no rash, no sore throat and no vomiting        Home Medications Prior to Admission medications   Medication Sig Start Date End Date Taking? Authorizing Provider  acetaminophen (TYLENOL) 500 MG tablet Take 1 tablet (500 mg total) by mouth every 6 (six) hours as needed for mild pain or moderate pain. 04/29/22   Fayette Pho, MD  aspirin EC 81 MG tablet Take 1 tablet (81 mg total) by mouth 2 (two) times daily. For DVT prophylaxis for 30 days after surgery. Patient taking differently: Take 81 mg by mouth daily. For DVT prophylaxis for 30 days after surgery. 01/17/22   Jenne Pane, PA-C  atorvastatin (LIPITOR) 10 MG tablet Take 5 mg by mouth daily.    [provider]  azelastine (ASTELIN) 0.1 % nasal spray Place 2 sprays into both nostrils 2 (two) times daily as needed for rhinitis (congestion).  07/30/20    [provider]  carvedilol (COREG) 25 MG tablet Take 25 mg by mouth daily.    [provider]  cetirizine (ZYRTEC) 10 MG tablet Take 10 mg by mouth daily.    [provider]  COLLAGEN PO Take 1 tablet by mouth daily.    [provider]  Continuous Blood Gluc Sensor (FREESTYLE LIBRE 14 DAY SENSOR) MISC Apply topically every 14 (fourteen) days. 08/17/21   [provider]  Continuous Blood Gluc Sensor (FREESTYLE LIBRE 14 DAY SENSOR) MISC APPLY 1 SENSOR EVERY 14 DAYS AS DIRECTED 02/24/21   [provider]  diclofenac Sodium (VOLTAREN) 1 % GEL Apply 4 g topically 4 (four) times daily. 09/23/20   Melene Plan, DO  DULoxetine (CYMBALTA) 60 MG capsule Take 60 mg by mouth daily. 03/09/22   [provider]  Empagliflozin-metFORMIN HCl ER (SYNJARDY XR) 25-1000 MG TB24 Take 1 tablet by mouth daily.    [provider]  famotidine (PEPCID) 40 MG tablet Take 40 mg by mouth at bedtime. 09/09/20   [provider]  fentaNYL (DURAGESIC) 50 MCG/HR Place 1 patch onto the skin every 3 (three) days. 09/15/21   [provider]  ferrous sulfate 325 (65 FE) MG tablet Take 325 mg by mouth daily with breakfast.    [provider]  Fluticasone-Salmeterol (ADVAIR) 250-50 MCG/DOSE AEPB Inhale 1 puff into the lungs 2 (two) times daily as needed (shortness  of breath/wheezing).     [provider]  furosemide (LASIX) 40 MG tablet Take 40 mg by mouth daily.    [provider]  hydrOXYzine (ATARAX/VISTARIL) 25 MG tablet Take 25 mg by mouth at bedtime. 07/30/20   [provider]  icosapent Ethyl (VASCEPA) 1 g capsule Take 2 g by mouth 2 (two) times daily.    [provider]  insulin aspart protamine - aspart (NOVOLOG MIX 70/30 FLEXPEN) (70-30) 100 UNIT/ML FlexPen Inject into the skin daily as needed (High blood sugar). Sliding scale Per pt at preop appt pt takes once a day in the am if glucose if greater than 90.  09/15/21   [provider]  lamoTRIgine 200 MG TBDP Take 1 tablet (200 mg total) by mouth daily. 07/17/22   Alric Ran, MD  losartan (COZAAR) 25 MG tablet Take 25 mg by mouth daily. 09/28/20   [provider]  lubiprostone (AMITIZA) 24 MCG capsule Take 24 mcg by mouth daily. 09/25/21   [provider]  magnesium oxide (MAG-OX) 400 MG tablet Take 400 mg by mouth daily.    [provider]  metolazone (ZAROXOLYN) 5 MG tablet Take 5 mg by mouth 3 (three) times a week. Sunday,Wednesday,friday    [provider]  Multiple Vitamin (MULTIVITAMIN WITH MINERALS) TABS tablet Take 1 tablet by mouth daily. 04/30/22   Ezequiel Essex, MD  nystatin (MYCOSTATIN/NYSTOP) powder Apply topically 2 (two) times daily. Apply to affected skin folds twice daily until cleared. 04/29/22   Ezequiel Essex, MD  ondansetron (ZOFRAN-ODT) 4 MG disintegrating tablet Take 1 tablet (4 mg total) by mouth 2 (two) times daily as needed for nausea or vomiting. 01/17/22   Britt Bottom, PA-C  oxyCODONE-acetaminophen (PERCOCET) 10-325 MG tablet Take 1 tablet by mouth every 8 (eight) hours as needed for pain. 12/13/21   [provider]  OZEMPIC, 1 MG/DOSE, 4 MG/3ML SOPN Inject 1 mg into the skin once a week. Take on Sunday 09/14/20   [provider]  pantoprazole (PROTONIX) 40 MG tablet Take 40 mg by mouth daily.    [provider]  pioglitazone (ACTOS) 15 MG tablet Take 15 mg by mouth daily.    [provider]  potassium chloride SA (KLOR-CON) 20 MEQ tablet Take 20 mEq by mouth daily.    [provider]  pramipexole (MIRAPEX) 0.5 MG tablet Take 1 mg by mouth at bedtime.    [provider]  pregabalin (LYRICA) 200 MG capsule Take 200 mg by mouth in the morning, at noon, and at bedtime.    [provider]  spironolactone (ALDACTONE) 50 MG tablet Take 50 mg by mouth daily.    [provider]      Allergies    Amlodipine,  Naltrexone, Penicillins, and Penicillin g    Review of Systems   Review of Systems  Constitutional:  Negative for chills and fever.  HENT:  Negative for sore throat.   Eyes:  Negative for redness and visual disturbance.  Respiratory:  Negative for cough and shortness of breath.   Cardiovascular:  Positive for leg swelling. Negative for chest pain.  Gastrointestinal:  Negative for abdominal pain, blood in stool, diarrhea and vomiting.  Genitourinary:  Negative for decreased urine volume, dysuria and flank pain.  Musculoskeletal:  Negative for back pain and neck pain.  Skin:  Negative for rash.  Neurological:  Negative for speech difficulty, numbness and headaches.  Hematological:  Does not bruise/bleed easily.    Physical Exam  Updated Vital Signs BP 137/84   Pulse 85   Temp 98.4 F (36.9 C)   Resp 20   Ht 1.676 m (5\' 6" )   Wt 94.3 kg   SpO2 100%   BMI 33.57 kg/m  Physical Exam Vitals and nursing note reviewed.  Constitutional:      Appearance: Normal appearance. She is well-developed.  HENT:     Head: Atraumatic.     Nose: Nose normal.     Mouth/Throat:     Mouth: Mucous membranes are moist.  Eyes:     General: No scleral icterus.    Conjunctiva/sclera: Conjunctivae normal.     Pupils: Pupils are equal, round, and reactive to light.  Neck:     Vascular: No carotid bruit.     Trachea: No tracheal deviation.     Comments: Normal rom. No stiffness or rigidity. Cardiovascular:     Rate and Rhythm: Normal rate and regular rhythm.     Pulses: Normal pulses.     Heart sounds: Normal heart sounds. No murmur heard.    No friction rub. No gallop.  Pulmonary:     Effort: Pulmonary effort is normal. No respiratory distress.     Breath sounds: Normal breath sounds.  Abdominal:     General: Bowel sounds are normal. There is no distension.     Palpations: Abdomen is soft. There is no mass.     Tenderness: There is no abdominal tenderness. There is no guarding.   Genitourinary:    Comments: No cva tenderness.  Musculoskeletal:     Cervical back: Normal range of motion and neck supple. No rigidity. No muscular tenderness.     Comments: Bilateral lower extremity edema, moderate. Mild erythema to bil lower legs ?chronic venous stasis, pt/spouse indicates baseline for her.   Skin:    General: Skin is warm and dry.     Findings: No rash.  Neurological:     Mental Status: She is alert.     Comments: Alert, speech normal. Motor intact bil, stre 5/5. No pronator drift. Sensation grossly intact bil.   Psychiatric:        Mood and Affect: Mood normal.     ED Results / Procedures / Treatments   Labs (all labs ordered are listed, but only abnormal results are displayed) Results for orders placed or performed during the hospital encounter of 01/03/23  Resp panel by RT-PCR (RSV, Flu A&B, Covid) Anterior Nasal Swab   Specimen: Anterior Nasal Swab  Result Value Ref Range   SARS Coronavirus 2 by RT PCR NEGATIVE NEGATIVE   Influenza A by PCR NEGATIVE NEGATIVE   Influenza B by PCR NEGATIVE NEGATIVE   Resp Syncytial Virus by PCR NEGATIVE NEGATIVE  CBC with Differential  Result Value Ref Range   WBC 5.2 4.0 - 10.5 K/uL   RBC 3.70 (L) 3.87 - 5.11 MIL/uL   Hemoglobin 10.7 (L) 12.0 - 15.0 g/dL   HCT 03/04/23 (L) 44.0 - 34.7 %   MCV 92.7 80.0 - 100.0 fL   MCH 28.9 26.0 - 34.0 pg   MCHC 31.2 30.0 - 36.0 g/dL   RDW 42.5 95.6 - 38.7 %   Platelets 132 (L) 150 - 400 K/uL   nRBC 0.0 0.0 - 0.2 %   Neutrophils Relative % 49 %   Neutro Abs 2.6 1.7 - 7.7 K/uL   Lymphocytes Relative 31 %   Lymphs Abs 1.6 0.7 - 4.0 K/uL   Monocytes Relative 15 %   Monocytes Absolute  0.8 0.1 - 1.0 K/uL   Eosinophils Relative 3 %   Eosinophils Absolute 0.1 0.0 - 0.5 K/uL   Basophils Relative 1 %   Basophils Absolute 0.0 0.0 - 0.1 K/uL   Immature Granulocytes 1 %   Abs Immature Granulocytes 0.07 0.00 - 0.07 K/uL  Brain natriuretic peptide  Result Value Ref Range   B Natriuretic  Peptide 117.9 (H) 0.0 - 100.0 pg/mL  Urinalysis, Routine w reflex microscopic Urine, Clean Catch  Result Value Ref Range   Color, Urine YELLOW YELLOW   APPearance HAZY (A) CLEAR   Specific Gravity, Urine 1.025 1.005 - 1.030   pH 5.0 5.0 - 8.0   Glucose, UA 150 (A) NEGATIVE mg/dL   Hgb urine dipstick SMALL (A) NEGATIVE   Bilirubin Urine NEGATIVE NEGATIVE   Ketones, ur NEGATIVE NEGATIVE mg/dL   Protein, ur >=300 (A) NEGATIVE mg/dL   Nitrite NEGATIVE NEGATIVE   Leukocytes,Ua NEGATIVE NEGATIVE   RBC / HPF 0-5 0 - 5 RBC/hpf   WBC, UA 11-20 0 - 5 WBC/hpf   Bacteria, UA RARE (A) NONE SEEN   Squamous Epithelial / HPF 0-5 0 - 5 /HPF   Mucus PRESENT   Comprehensive metabolic panel  Result Value Ref Range   Sodium 134 (L) 135 - 145 mmol/L   Potassium 3.5 3.5 - 5.1 mmol/L   Chloride 98 98 - 111 mmol/L   CO2 28 22 - 32 mmol/L   Glucose, Bld 267 (H) 70 - 99 mg/dL   BUN 22 8 - 23 mg/dL   Creatinine, Ser 1.21 (H) 0.44 - 1.00 mg/dL   Calcium 8.4 (L) 8.9 - 10.3 mg/dL   Total Protein 6.2 (L) 6.5 - 8.1 g/dL   Albumin 2.4 (L) 3.5 - 5.0 g/dL   AST 13 (L) 15 - 41 U/L   ALT 13 0 - 44 U/L   Alkaline Phosphatase 79 38 - 126 U/L   Total Bilirubin 0.5 0.3 - 1.2 mg/dL   GFR, Estimated 50 (L) >60 mL/min   Anion gap 8 5 - 15  Valproic acid level  Result Value Ref Range   Valproic Acid Lvl 23 (L) 50.0 - 100.0 ug/mL  Ammonia  Result Value Ref Range   Ammonia 26 9 - 35 umol/L   CT Head Wo Contrast  Result Date: 01/03/2023 CLINICAL DATA:  Frequent falls with lower extremity swelling and shortness of breath. EXAM: CT HEAD WITHOUT CONTRAST TECHNIQUE: Contiguous axial images were obtained from the base of the skull through the vertex without intravenous contrast. RADIATION DOSE REDUCTION: This exam was performed according to the departmental dose-optimization program which includes automated exposure control, adjustment of the mA and/or kV according to patient size and/or use of iterative reconstruction  technique. COMPARISON:  July 13, 2022 FINDINGS: Brain: There is mild cerebral atrophy with widening of the extra-axial spaces and stable ventricular dilatation. There are areas of decreased attenuation within the white matter tracts of the supratentorial brain, consistent with microvascular disease changes. Vascular: No hyperdense vessel or unexpected calcification. Skull: Normal. Negative for fracture or focal lesion. Sinuses/Orbits: No acute finding. Other: None. IMPRESSION: 1. No acute intracranial abnormality. 2. Generalized cerebral atrophy with chronic white matter small vessel ischemic changes. Electronically Signed   By: Virgina Norfolk M.D.   On: 01/03/2023 23:03   DG Chest 2 View  Result Date: 01/03/2023 CLINICAL DATA:  Shortness of breath and weakness. EXAM: CHEST - 2 VIEW COMPARISON:  July 13, 2022 FINDINGS: The heart size and mediastinal contours are  within normal limits. Both lungs are clear. Radiopaque surgical clips are seen within the right upper quadrant. Stable spinal stimulator wire positioning is noted. The visualized skeletal structures are unremarkable. IMPRESSION: No active cardiopulmonary disease. Electronically Signed   By: Aram Candela M.D.   On: 01/03/2023 20:22    EKG EKG Interpretation  Date/Time:  Thursday January 03 2023 19:50:11 EST Ventricular Rate:  89 PR Interval:  148 QRS Duration: 76 QT Interval:  380 QTC Calculation: 462 R Axis:   39 Text Interpretation: Sinus rhythm with Premature atrial complexes Non-specific ST-t changes Confirmed by Cathren Laine ([id number]) on 01/03/2023 9:13:11 PM  Radiology CT Head Wo Contrast  Result Date: 01/03/2023 CLINICAL DATA:  Frequent falls with lower extremity swelling and shortness of breath. EXAM: CT HEAD WITHOUT CONTRAST TECHNIQUE: Contiguous axial images were obtained from the base of the skull through the vertex without intravenous contrast. RADIATION DOSE REDUCTION: This exam was performed according to the  departmental dose-optimization program which includes automated exposure control, adjustment of the mA and/or kV according to patient size and/or use of iterative reconstruction technique. COMPARISON:  July 13, 2022 FINDINGS: Brain: There is mild cerebral atrophy with widening of the extra-axial spaces and stable ventricular dilatation. There are areas of decreased attenuation within the white matter tracts of the supratentorial brain, consistent with microvascular disease changes. Vascular: No hyperdense vessel or unexpected calcification. Skull: Normal. Negative for fracture or focal lesion. Sinuses/Orbits: No acute finding. Other: None. IMPRESSION: 1. No acute intracranial abnormality. 2. Generalized cerebral atrophy with chronic white matter small vessel ischemic changes. Electronically Signed   By: Aram Candela M.D.   On: 01/03/2023 23:03   DG Chest 2 View  Result Date: 01/03/2023 CLINICAL DATA:  Shortness of breath and weakness. EXAM: CHEST - 2 VIEW COMPARISON:  July 13, 2022 FINDINGS: The heart size and mediastinal contours are within normal limits. Both lungs are clear. Radiopaque surgical clips are seen within the right upper quadrant. Stable spinal stimulator wire positioning is noted. The visualized skeletal structures are unremarkable. IMPRESSION: No active cardiopulmonary disease. Electronically Signed   By: Aram Candela M.D.   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 No fevers.  Essential hypertension: chronic illness or  injury Generalized weakness: acute illness or injury with systemic symptoms Hyperglycemia: acute illness or injury Hypoalbuminemia: acute illness or injury Polypharmacy: chronic illness or injury Thrombocytopenia (HCC): chronic illness or injury Urine white blood cells increased: acute illness or injury  Amount and/or Complexity of Data Reviewed Independent Historian: spouse    Details: hx External Data Reviewed: labs and notes. Labs: ordered. Decision-making details documented in ED Course. Radiology: ordered and independent interpretation performed. Decision-making details documented in ED Course. ECG/medicine tests: ordered and independent interpretation performed. Decision-making details documented in ED Course.  Risk Prescription drug management. Decision regarding hospitalization.   Iv ns. Continuous pulse ox and cardiac monitoring. Labs ordered/sent. Imaging ordered.   Differential diagnosis includes  dehydration, anemia, uti, aki, head injury, etc. Dispo decision including potential need for admission considered - will get labs and imaging and reassess.   Reviewed nursing notes and prior charts for additional history. External reports reviewed. Additional history from: spouse.   Cardiac monitor: sinus rhythm, rate 78.  Labs reviewed/interpreted by me - wbc normal. Glucose mildly high. Hco3 normal. Albumin low.   Xrays reviewed/interpreted by me - no pna.   CT reviewed/interpreted by me - no hem.   On [facility] prior admission [facility], pt with transient altered ms/weakness/?encephalopathy - on review no clear cause them - ?possible polypharmacy and med side effect related.   Possible uti with 11-20 wbc, keflex po. Po fluids/food.   Pts vital signs are normal. Pulse ox is 100%. No distress. Pt currently appears stable for d/c.  Rec close pcp f/u with all meds, discuss possible med management, limiting sedating meds, maximizing nutrition/fluid  status, possible home PT.  Return precautions provided.            Final Clinical Impression(s) / ED Diagnoses Final diagnoses:  Generalized weakness  Hyperglycemia  Hypoalbuminemia  Bilateral leg edema  Essential hypertension  Polypharmacy    Rx / DC Orders ED Discharge Orders     None            Cathren Laine, MD 01/04/23 1211

## 2023-01-03 NOTE — ED Notes (Signed)
Pt arrived to [facility]. 100% on 2L [place], breathing unlabored and regular. Significant bilateral lower extremity erythema and edema with weeping blisters noted. Husband at bedside reports multiple falls, confusion since admission for seizures and restarting Keppra and newly starting Depakote.

## 2023-01-04 ENCOUNTER — Inpatient Hospital Stay (HOSPITAL_COMMUNITY): Payer: BC Managed Care – PPO

## 2023-01-04 DIAGNOSIS — E1165 Type 2 diabetes mellitus with hyperglycemia: Secondary | ICD-10-CM | POA: Diagnosis present

## 2023-01-04 DIAGNOSIS — R600 Localized edema: Secondary | ICD-10-CM | POA: Diagnosis not present

## 2023-01-04 DIAGNOSIS — L03115 Cellulitis of right lower limb: Secondary | ICD-10-CM | POA: Diagnosis present

## 2023-01-04 DIAGNOSIS — E8809 Other disorders of plasma-protein metabolism, not elsewhere classified: Secondary | ICD-10-CM

## 2023-01-04 DIAGNOSIS — R531 Weakness: Secondary | ICD-10-CM | POA: Diagnosis not present

## 2023-01-04 DIAGNOSIS — K219 Gastro-esophageal reflux disease without esophagitis: Secondary | ICD-10-CM | POA: Diagnosis present

## 2023-01-04 DIAGNOSIS — G9340 Encephalopathy, unspecified: Secondary | ICD-10-CM | POA: Diagnosis not present

## 2023-01-04 DIAGNOSIS — G40909 Epilepsy, unspecified, not intractable, without status epilepticus: Secondary | ICD-10-CM | POA: Diagnosis present

## 2023-01-04 DIAGNOSIS — Z79899 Other long term (current) drug therapy: Secondary | ICD-10-CM

## 2023-01-04 DIAGNOSIS — I10 Essential (primary) hypertension: Secondary | ICD-10-CM

## 2023-01-04 DIAGNOSIS — T50995A Adverse effect of other drugs, medicaments and biological substances, initial encounter: Secondary | ICD-10-CM | POA: Diagnosis present

## 2023-01-04 DIAGNOSIS — J449 Chronic obstructive pulmonary disease, unspecified: Secondary | ICD-10-CM | POA: Diagnosis present

## 2023-01-04 DIAGNOSIS — I5033 Acute on chronic diastolic (congestive) heart failure: Secondary | ICD-10-CM | POA: Diagnosis present

## 2023-01-04 DIAGNOSIS — W19XXXA Unspecified fall, initial encounter: Secondary | ICD-10-CM | POA: Diagnosis present

## 2023-01-04 DIAGNOSIS — E662 Morbid (severe) obesity with alveolar hypoventilation: Secondary | ICD-10-CM | POA: Diagnosis present

## 2023-01-04 DIAGNOSIS — R739 Hyperglycemia, unspecified: Secondary | ICD-10-CM

## 2023-01-04 DIAGNOSIS — K7400 Hepatic fibrosis, unspecified: Secondary | ICD-10-CM | POA: Diagnosis present

## 2023-01-04 DIAGNOSIS — I5031 Acute diastolic (congestive) heart failure: Secondary | ICD-10-CM | POA: Diagnosis not present

## 2023-01-04 DIAGNOSIS — R4182 Altered mental status, unspecified: Secondary | ICD-10-CM

## 2023-01-04 DIAGNOSIS — D696 Thrombocytopenia, unspecified: Secondary | ICD-10-CM

## 2023-01-04 DIAGNOSIS — R410 Disorientation, unspecified: Secondary | ICD-10-CM | POA: Diagnosis not present

## 2023-01-04 DIAGNOSIS — Z1152 Encounter for screening for COVID-19: Secondary | ICD-10-CM | POA: Diagnosis not present

## 2023-01-04 DIAGNOSIS — G928 Other toxic encephalopathy: Secondary | ICD-10-CM | POA: Diagnosis present

## 2023-01-04 DIAGNOSIS — I110 Hypertensive heart disease with heart failure: Secondary | ICD-10-CM | POA: Diagnosis present

## 2023-01-04 DIAGNOSIS — Y92009 Unspecified place in unspecified non-institutional (private) residence as the place of occurrence of the external cause: Secondary | ICD-10-CM | POA: Diagnosis not present

## 2023-01-04 DIAGNOSIS — R296 Repeated falls: Secondary | ICD-10-CM | POA: Diagnosis present

## 2023-01-04 DIAGNOSIS — G2581 Restless legs syndrome: Secondary | ICD-10-CM | POA: Diagnosis present

## 2023-01-04 DIAGNOSIS — G9341 Metabolic encephalopathy: Secondary | ICD-10-CM | POA: Diagnosis not present

## 2023-01-04 DIAGNOSIS — E785 Hyperlipidemia, unspecified: Secondary | ICD-10-CM | POA: Diagnosis present

## 2023-01-04 DIAGNOSIS — E1151 Type 2 diabetes mellitus with diabetic peripheral angiopathy without gangrene: Secondary | ICD-10-CM | POA: Diagnosis present

## 2023-01-04 DIAGNOSIS — L03116 Cellulitis of left lower limb: Secondary | ICD-10-CM | POA: Diagnosis present

## 2023-01-04 DIAGNOSIS — N189 Chronic kidney disease, unspecified: Secondary | ICD-10-CM | POA: Diagnosis present

## 2023-01-04 DIAGNOSIS — Z87891 Personal history of nicotine dependence: Secondary | ICD-10-CM | POA: Diagnosis not present

## 2023-01-04 DIAGNOSIS — E119 Type 2 diabetes mellitus without complications: Secondary | ICD-10-CM

## 2023-01-04 DIAGNOSIS — G8929 Other chronic pain: Secondary | ICD-10-CM | POA: Diagnosis present

## 2023-01-04 DIAGNOSIS — K760 Fatty (change of) liver, not elsewhere classified: Secondary | ICD-10-CM

## 2023-01-04 DIAGNOSIS — L0390 Cellulitis, unspecified: Secondary | ICD-10-CM | POA: Diagnosis present

## 2023-01-04 DIAGNOSIS — F03A0 Unspecified dementia, mild, without behavioral disturbance, psychotic disturbance, mood disturbance, and anxiety: Secondary | ICD-10-CM | POA: Diagnosis present

## 2023-01-04 DIAGNOSIS — Z8616 Personal history of COVID-19: Secondary | ICD-10-CM | POA: Diagnosis not present

## 2023-01-04 LAB — ECHOCARDIOGRAM COMPLETE
Area-P 1/2: 3.3 cm2
Calc EF: 60.7 %
Height: 66 in
Single Plane A2C EF: 46 %
Single Plane A4C EF: 70.6 %
Weight: 3328 oz

## 2023-01-04 LAB — BASIC METABOLIC PANEL
Anion gap: 10 (ref 5–15)
BUN: 19 mg/dL (ref 8–23)
CO2: 29 mmol/L (ref 22–32)
Calcium: 8.3 mg/dL — ABNORMAL LOW (ref 8.9–10.3)
Chloride: 100 mmol/L (ref 98–111)
Creatinine, Ser: 1.02 mg/dL — ABNORMAL HIGH (ref 0.44–1.00)
GFR, Estimated: >60 mL/min (ref >60)
Glucose, Bld: 166 mg/dL — ABNORMAL HIGH (ref 70–99)
Potassium: 3.4 mmol/L — ABNORMAL LOW (ref 3.5–5.1)
Sodium: 139 mmol/L (ref 135–145)

## 2023-01-04 LAB — VITAMIN B12: Vitamin B-12: 504 pg/mL (ref 180–914)

## 2023-01-04 LAB — GLUCOSE, CAPILLARY
Glucose-Capillary: 169 mg/dL — ABNORMAL HIGH (ref 70–99)
Glucose-Capillary: 208 mg/dL — ABNORMAL HIGH (ref 70–99)
Glucose-Capillary: 232 mg/dL — ABNORMAL HIGH (ref 70–99)

## 2023-01-04 LAB — HEMOGLOBIN A1C
Hgb A1c MFr Bld: 7.2 % — ABNORMAL HIGH (ref 4.8–5.6)
Mean Plasma Glucose: 159.94 mg/dL

## 2023-01-04 LAB — TSH: TSH: 3.512 u[IU]/mL (ref 0.350–4.500)

## 2023-01-04 LAB — CK: Total CK: 48 U/L (ref 38–234)

## 2023-01-04 LAB — CBG MONITORING, ED: Glucose-Capillary: 155 mg/dL — ABNORMAL HIGH (ref 70–99)

## 2023-01-04 LAB — HIV ANTIBODY (ROUTINE TESTING W REFLEX): HIV Screen 4th Generation wRfx: NONREACTIVE

## 2023-01-04 LAB — RPR: RPR Ser Ql: NONREACTIVE

## 2023-01-04 LAB — MRSA NEXT GEN BY PCR, NASAL: MRSA by PCR Next Gen: NOT DETECTED

## 2023-01-04 MED ORDER — RAMIPRIL 1.25 MG PO CAPS
2.5000 mg | ORAL_CAPSULE | Freq: Two times a day (BID) | ORAL | Status: DC
  Administered 2023-01-04 – 2023-01-05 (×3): 2.5 mg via ORAL
  Filled 2023-01-04 (×3): qty 2

## 2023-01-04 MED ORDER — ONDANSETRON HCL 4 MG/2ML IJ SOLN: 4.0000 mg | Freq: Four times a day (QID) | INTRAMUSCULAR | Status: DC | PRN

## 2023-01-04 MED ORDER — FUROSEMIDE 40 MG PO TABS
40.0000 mg | ORAL_TABLET | Freq: Every day | ORAL | Status: DC
  Administered 2023-01-05 – 2023-01-07 (×3): 40 mg via ORAL
  Filled 2023-01-04 (×3): qty 1

## 2023-01-04 MED ORDER — ACETAMINOPHEN 325 MG PO TABS: 650.0000 mg | ORAL_TABLET | ORAL | Status: DC | PRN

## 2023-01-04 MED ORDER — CARVEDILOL 25 MG PO TABS
25.0000 mg | ORAL_TABLET | Freq: Every day | ORAL | Status: DC
  Administered 2023-01-04 – 2023-01-06 (×3): 25 mg via ORAL
  Filled 2023-01-04 (×3): qty 1

## 2023-01-04 MED ORDER — SODIUM CHLORIDE 0.9% FLUSH
3.0000 mL | Freq: Two times a day (BID) | INTRAVENOUS | Status: DC
  Administered 2023-01-04 – 2023-01-08 (×8): 3 mL via INTRAVENOUS

## 2023-01-04 MED ORDER — FUROSEMIDE 10 MG/ML IJ SOLN
40.0000 mg | Freq: Two times a day (BID) | INTRAMUSCULAR | Status: DC
  Administered 2023-01-04: 40 mg via INTRAVENOUS
  Filled 2023-01-04: qty 4

## 2023-01-04 MED ORDER — SODIUM CHLORIDE 0.9% FLUSH: 3.0000 mL | INTRAVENOUS | Status: DC | PRN

## 2023-01-04 MED ORDER — PNEUMOCOCCAL 20-VAL CONJ VACC 0.5 ML IM SUSY: 0.5000 mL | PREFILLED_SYRINGE | INTRAMUSCULAR | Status: DC | PRN

## 2023-01-04 MED ORDER — ICOSAPENT ETHYL 1 G PO CAPS
2.0000 g | ORAL_CAPSULE | Freq: Two times a day (BID) | ORAL | Status: DC
  Administered 2023-01-04 – 2023-01-05 (×4): 2 g via ORAL
  Filled 2023-01-04 (×5): qty 2

## 2023-01-04 MED ORDER — INSULIN ASPART 100 UNIT/ML IJ SOLN
0.0000 [IU] | Freq: Every day | INTRAMUSCULAR | Status: DC
  Administered 2023-01-04: 2 [IU] via SUBCUTANEOUS

## 2023-01-04 MED ORDER — ACETAMINOPHEN 325 MG PO TABS
650.0000 mg | ORAL_TABLET | Freq: Four times a day (QID) | ORAL | Status: DC | PRN
  Administered 2023-01-10: 650 mg via ORAL
  Filled 2023-01-04: qty 2

## 2023-01-04 MED ORDER — SENNOSIDES-DOCUSATE SODIUM 8.6-50 MG PO TABS: 1.0000 | ORAL_TABLET | Freq: Every evening | ORAL | Status: DC | PRN

## 2023-01-04 MED ORDER — FAMOTIDINE 20 MG PO TABS
40.0000 mg | ORAL_TABLET | Freq: Every day | ORAL | Status: DC
Start: 2023-01-04 — End: 2023-01-11
  Administered 2023-01-04 – 2023-01-10 (×7): 40 mg via ORAL
  Filled 2023-01-04 (×7): qty 2

## 2023-01-04 MED ORDER — AZELASTINE HCL 0.1 % NA SOLN: 2.0000 | NASAL | Status: DC | PRN

## 2023-01-04 MED ORDER — VANCOMYCIN HCL 2000 MG/400ML IV SOLN
2000.0000 mg | Freq: Once | INTRAVENOUS | Status: AC
  Administered 2023-01-04: 2000 mg via INTRAVENOUS
  Filled 2023-01-04: qty 400

## 2023-01-04 MED ORDER — PRAMIPEXOLE DIHYDROCHLORIDE 0.25 MG PO TABS
1.0000 mg | ORAL_TABLET | Freq: Every day | ORAL | Status: DC
  Administered 2023-01-04 – 2023-01-10 (×7): 1 mg via ORAL
  Filled 2023-01-04 (×7): qty 4

## 2023-01-04 MED ORDER — DIVALPROEX SODIUM 500 MG PO DR TAB
500.0000 mg | DELAYED_RELEASE_TABLET | Freq: Two times a day (BID) | ORAL | Status: DC
  Administered 2023-01-04 – 2023-01-11 (×15): 500 mg via ORAL
  Filled 2023-01-04 (×15): qty 1

## 2023-01-04 MED ORDER — VANCOMYCIN HCL IN DEXTROSE 1-5 GM/200ML-% IV SOLN
1000.0000 mg | INTRAVENOUS | Status: DC
  Administered 2023-01-05 – 2023-01-08 (×4): 1000 mg via INTRAVENOUS
  Filled 2023-01-04 (×5): qty 200

## 2023-01-04 MED ORDER — ENOXAPARIN SODIUM 40 MG/0.4ML IJ SOSY: 40.0000 mg | PREFILLED_SYRINGE | Freq: Every day | INTRAMUSCULAR | Status: DC

## 2023-01-04 MED ORDER — DULOXETINE HCL 60 MG PO CPEP
60.0000 mg | ORAL_CAPSULE | Freq: Every day | ORAL | Status: DC
  Administered 2023-01-04 – 2023-01-11 (×8): 60 mg via ORAL
  Filled 2023-01-04 (×8): qty 1

## 2023-01-04 MED ORDER — LEVETIRACETAM 500 MG PO TABS
1000.0000 mg | ORAL_TABLET | Freq: Two times a day (BID) | ORAL | Status: DC
  Administered 2023-01-04 – 2023-01-11 (×15): 1000 mg via ORAL
  Filled 2023-01-04 (×15): qty 2

## 2023-01-04 MED ORDER — HEPARIN SODIUM (PORCINE) 5000 UNIT/ML IJ SOLN
5000.0000 [IU] | Freq: Three times a day (TID) | INTRAMUSCULAR | Status: DC
  Administered 2023-01-04 – 2023-01-11 (×21): 5000 [IU] via SUBCUTANEOUS
  Filled 2023-01-04 (×21): qty 1

## 2023-01-04 MED ORDER — LEVETIRACETAM 500 MG PO TABS: 500.0000 mg | ORAL_TABLET | Freq: Two times a day (BID) | ORAL | Status: DC

## 2023-01-04 MED ORDER — INSULIN ASPART 100 UNIT/ML IJ SOLN
0.0000 [IU] | Freq: Three times a day (TID) | INTRAMUSCULAR | Status: DC
  Administered 2023-01-04: 3 [IU] via SUBCUTANEOUS
  Administered 2023-01-04: 5 [IU] via SUBCUTANEOUS
  Administered 2023-01-04: 3 [IU] via SUBCUTANEOUS
  Administered 2023-01-05: 5 [IU] via SUBCUTANEOUS
  Administered 2023-01-05 (×2): 3 [IU] via SUBCUTANEOUS
  Administered 2023-01-06: 2 [IU] via SUBCUTANEOUS
  Administered 2023-01-06: 3 [IU] via SUBCUTANEOUS
  Administered 2023-01-06: 2 [IU] via SUBCUTANEOUS
  Administered 2023-01-07: 3 [IU] via SUBCUTANEOUS
  Administered 2023-01-07: 2 [IU] via SUBCUTANEOUS
  Administered 2023-01-08: 3 [IU] via SUBCUTANEOUS
  Administered 2023-01-08: 2 [IU] via SUBCUTANEOUS
  Administered 2023-01-09: 5 [IU] via SUBCUTANEOUS
  Administered 2023-01-09 (×2): 2 [IU] via SUBCUTANEOUS

## 2023-01-04 MED ORDER — ATORVASTATIN CALCIUM 10 MG PO TABS
10.0000 mg | ORAL_TABLET | Freq: Every day | ORAL | Status: DC
  Administered 2023-01-04 – 2023-01-11 (×8): 10 mg via ORAL
  Filled 2023-01-04 (×8): qty 1

## 2023-01-04 MED ORDER — MOMETASONE FURO-FORMOTEROL FUM 200-5 MCG/ACT IN AERO
2.0000 | INHALATION_SPRAY | Freq: Two times a day (BID) | RESPIRATORY_TRACT | Status: DC
  Administered 2023-01-04 – 2023-01-11 (×14): 2 via RESPIRATORY_TRACT
  Filled 2023-01-04: qty 8.8

## 2023-01-04 MED ORDER — PANTOPRAZOLE SODIUM 40 MG PO TBEC
40.0000 mg | DELAYED_RELEASE_TABLET | Freq: Every day | ORAL | Status: DC
  Administered 2023-01-04 – 2023-01-11 (×8): 40 mg via ORAL
  Filled 2023-01-04 (×8): qty 1

## 2023-01-04 MED ORDER — LACTULOSE 10 GM/15ML PO SOLN
10.0000 g | Freq: Every day | ORAL | Status: DC
  Administered 2023-01-04 – 2023-01-05 (×2): 10 g via ORAL
  Filled 2023-01-04 (×3): qty 15

## 2023-01-04 MED ORDER — PREGABALIN 100 MG PO CAPS
200.0000 mg | ORAL_CAPSULE | Freq: Two times a day (BID) | ORAL | Status: DC
  Administered 2023-01-04 – 2023-01-10 (×13): 200 mg via ORAL
  Filled 2023-01-04 (×13): qty 2

## 2023-01-04 MED ORDER — SODIUM CHLORIDE 0.9 % IV SOLN
1.0000 g | INTRAVENOUS | Status: DC
  Administered 2023-01-04: 1 g via INTRAVENOUS
  Filled 2023-01-04: qty 10

## 2023-01-04 MED ORDER — SODIUM CHLORIDE 0.9 % IV SOLN: 250.0000 mL | INTRAVENOUS | Status: DC | PRN

## 2023-01-04 MED ORDER — ACETAMINOPHEN 650 MG RE SUPP: 650.0000 mg | Freq: Four times a day (QID) | RECTAL | Status: DC | PRN

## 2023-01-04 NOTE — H&P (Addendum)
PCP:   Tamsen Roers, NP   Chief Complaint: Altered mentation, fall   HPI: This is a 67 year old female with PMH seizures, HFpEF (2018), DM2, HTN, COPD, CKD, PVD, OSA no CPAP, chronic pain, fibromyalgia on opioids, NASH, past EtOH abuse, unintentional tylenol OD (2012), who presents to the ER by her husband for altered mentation, unsteady gait.  Patient fell twice today and once yesterday. On Tuesday patient  fell and lai history pd there al night until her huband came ho straight me and fou placed nd her on the floor.   Patient has a history of seizures, she was maintained on Lamictal and Keppra.  The patient stopped both and sustained a seizure mid December.  She was admitted and her seizure medication changed to Keppra 100 mg twice daily [and increase] and Depakote 500 mg p.o. twice daily [new].  Per husband since discharge and the new medications patient is has been getting progressively more confused.  At baseline patient does have some confusion that is intermittent.  Per husband she goes by the next morning they would have cleared up.  However those are getting worse and she is getting weaker culminating into the falls.  Has been used patient all her medication, she is positive she does not get into them.  Per husband he does medication as prescribed.  Patient states her lower extremities have been hurting for the past 2 days.  History provided mainly by the husband.  Patient is alert and aware, she attends and gives a history is hesitant and a bit too confused to be reliable.  Review of Systems:  The patient denies anorexia, fever, weight loss,, vision loss, decreased hearing, hoarseness, chest pain, syncope, dyspnea on exertion, peripheral edema, balance deficits, hemoptysis, abdominal pain, melena, hematochezia, severe indigestion/heartburn, hematuria, incontinence, genital sores, muscle weakness, suspicious skin lesions, transient blindness, difficulty walking, depression,  unusual weight change, abnormal bleeding, enlarged lymph nodes, angioedema, and breast masses. Positives: Altered mentation, falls, lower extremity weakness, lower extremity pain, lower extremity redness   Past Medical History: Past Medical History:  Diagnosis Date   Acute metabolic encephalopathy    Acute respiratory failure with hypoxia and hypercapnia (HCC)    AKI (acute kidney injury) (HCC) 09/15/2020   Altered mental status    Anxiety    Arthritis    Cerebral atrophy, Head CT (HCC) 09/23/2020   CHF (congestive heart failure) (HCC)    Chronic pain 04/29/2022   COPD (chronic obstructive pulmonary disease) (HCC)    Diabetes mellitus without complication (HCC)    DM II (diabetes mellitus, type II), controlled (HCC) 08/01/2020   Fatty liver 2012   per pt 06/24/20   GERD (gastroesophageal reflux disease)    Heart murmur    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 Patient hospitalized in December 18-22, 2014 at [facility] for hypercapnia and hepatic encephalopathy.    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   Seizure disorder (HCC) 04/29/2022  Seizures ([place])    Shock ([place])    Sleep apnea    Spinal cord stimulator present    Toxic encephalopathy 04/28/2022   Past Surgical History:  Procedure Laterality Date   BACK SURGERY     carotid artery surgery Right 10/07/2012   carotid ultrasound  06/2015    CHOLECYSTECTOMY  1981   COLONOSCOPY  2014   DIAGNOSTIC MAMMOGRAM  03/2017   groin lypnoid Left 03/2015   LUMBAR WOUND DEBRIDEMENT N/A 08/02/2020   Procedure: IRRIGATE AND DEBRIDEMENT OF LUMBAR WOUND, PLACEMENT OF WOUND VAC;  Surgeon: Dawley, Theodoro Doing, DO;  Location: [place];  Service: Neurosurgery;  Laterality: N/A;   right heel spur  1994   SPINAL CORD STIMULATOR INSERTION N/A 05/03/2021   Procedure: Spinal Cord Stimulator Placement;  Surgeon: Reece Agar, MD;  Location: [place];  Service: Neurosurgery;  Laterality: N/A;   TOTAL KNEE ARTHROPLASTY Left 01/16/2022   Procedure: TOTAL KNEE ARTHROPLASTY;  Surgeon: Renette Butters, MD;  Location: [facility];  Service: Orthopedics;  Laterality: Left;   TUBAL LIGATION  1985   wrist trigger release Right 01/2016    Medications: Prior to Admission medications   Medication Sig Start Date End Date Taking? Authorizing Provider  cephALEXin (KEFLEX) 500 MG capsule Take 1 capsule (500 mg total) by mouth 4 (four) times daily. 01/03/23  Yes Lajean Saver, MD  acetaminophen (TYLENOL) 500 MG tablet Take 1 tablet (500 mg total) by mouth every 6 (six) hours as needed for mild pain or moderate pain. 04/29/22   Ezequiel Essex, MD  aspirin EC 81 MG tablet Take 1 tablet (81 mg total) by mouth 2 (two) times daily. For DVT prophylaxis for 30 days after surgery. Patient taking differently: Take 81 mg by mouth daily. For DVT prophylaxis for 30 days after surgery. 01/17/22   Britt Bottom, PA-C  atorvastatin (LIPITOR) 10 MG tablet Take 5 mg by mouth daily.    [provider]  azelastine (ASTELIN) 0.1 % nasal spray Place 2 sprays into both nostrils 2 (two) times daily as needed for rhinitis (congestion).  07/30/20   [provider]  carvedilol (COREG) 25 MG tablet Take 25 mg by mouth daily.    [provider]  cetirizine (ZYRTEC) 10 MG tablet Take 10 mg by mouth daily.    [provider]  COLLAGEN PO Take 1 tablet by mouth daily.    [provider]  Continuous Blood Gluc Sensor (FREESTYLE LIBRE 14 DAY SENSOR) MISC Apply topically every 14 (fourteen) days. 08/17/21   [provider]  Continuous Blood Gluc Sensor (FREESTYLE LIBRE 14 DAY SENSOR) MISC APPLY 1 SENSOR EVERY 14 DAYS AS DIRECTED 02/24/21   [provider]  diclofenac Sodium (VOLTAREN) 1 % GEL Apply 4 g topically 4 (four) times daily. 09/23/20   Deno Etienne, DO  DULoxetine (CYMBALTA) 60 MG capsule Take 60 mg by mouth daily. 03/09/22   [provider]  Empagliflozin-metFORMIN HCl ER (SYNJARDY XR) 25-1000 MG TB24 Take 1 tablet by mouth daily.    [provider]  famotidine (PEPCID) 40 MG tablet Take 40 mg by mouth at bedtime. 09/09/20   [provider]  fentaNYL (DURAGESIC) 50 MCG/HR Place 1 patch onto the skin every 3 (three) days. 09/15/21   [provider]  ferrous sulfate 325 (65 FE) MG tablet Take 325 mg by mouth daily with breakfast.    [provider]  Fluticasone-Salmeterol (ADVAIR) 250-50 MCG/DOSE AEPB Inhale 1 puff into the lungs 2 (two)  times daily as needed (shortness of breath/wheezing).     [provider]  furosemide (LASIX) 40 MG tablet Take 40 mg by mouth daily.    [provider]  hydrOXYzine (ATARAX/VISTARIL) 25 MG tablet Take 25 mg by mouth at bedtime. 07/30/20   [provider]  icosapent Ethyl (VASCEPA) 1 g capsule Take 2 g by mouth 2 (two) times daily.    [provider]  insulin aspart protamine - aspart (NOVOLOG MIX 70/30 FLEXPEN) (70-30) 100 UNIT/ML FlexPen Inject into the skin daily as needed (High blood sugar). Sliding scale Per pt at preop appt pt takes once a day in the am if glucose if greater than 90. 09/15/21   [provider]  lamoTRIgine 200 MG TBDP Take 1 tablet (200 mg total) by mouth daily. 07/17/22   Windell Norfolk, MD  losartan (COZAAR) 25 MG tablet Take 25 mg by mouth daily. 09/28/20   [provider]  lubiprostone (AMITIZA) 24  MCG capsule Take 24 mcg by mouth daily. 09/25/21   [provider]  magnesium oxide (MAG-OX) 400 MG tablet Take 400 mg by mouth daily.    [provider]  metolazone (ZAROXOLYN) 5 MG tablet Take 5 mg by mouth 3 (three) times a week. Sunday,Wednesday,friday    [provider]  Multiple Vitamin (MULTIVITAMIN WITH MINERALS) TABS tablet Take 1 tablet by mouth daily. 04/30/22   Fayette Pho, MD  nystatin (MYCOSTATIN/NYSTOP) powder Apply topically 2 (two) times daily. Apply to affected skin folds twice daily until cleared. 04/29/22   Fayette Pho, MD  ondansetron (ZOFRAN-ODT) 4 MG disintegrating tablet Take 1 tablet (4 mg total) by mouth 2 (two) times daily as needed for nausea or vomiting. 01/17/22   Jenne Pane, PA-C  oxyCODONE-acetaminophen (PERCOCET) 10-325 MG tablet Take 1 tablet by mouth every 8 (eight) hours as needed for pain. 12/13/21   [provider]  OZEMPIC, 1 MG/DOSE, 4 MG/3ML SOPN Inject 1 mg into the skin once a week. Take on Sunday 09/14/20   [provider]  pantoprazole (PROTONIX) 40 MG tablet Take 40 mg by mouth daily.    [provider]  pioglitazone (ACTOS) 15 MG tablet Take 15 mg by mouth daily.    [provider]  potassium chloride SA (KLOR-CON) 20 MEQ tablet Take 20 mEq by mouth daily.    [provider]  pramipexole (MIRAPEX) 0.5 MG tablet Take 1 mg by mouth at bedtime.    [provider]  pregabalin (LYRICA) 200 MG capsule Take 200 mg by mouth in the morning, at noon, and at bedtime.    [provider]  spironolactone (ALDACTONE) 50 MG tablet Take 50 mg by mouth daily.    [provider]    Allergies:   Allergies  Allergen Reactions   Amlodipine Anaphylaxis, Swelling and Other (See Comments)    Tongue swelling    Naltrexone Hives   Penicillins Hives and Other (See Comments)    Tolerated ceftriaxone and cefepime 06/2017 Hives     Penicillin G     Social History:   reports that she quit smoking about 20 months ago. Her smoking use included cigarettes. She smoked an average of 1 pack per day. She has never used smokeless tobacco. She reports that she does not drink alcohol and does not use drugs.  Family History: Family History  Problem Relation Age of Onset   Diabetes Mother    Heart disease Mother    Stomach cancer Mother  Diabetes Father    Heart disease Father    Lung cancer Father     Physical Exam: Vitals:   01/03/23 2145 01/03/23 2200 01/03/23 2330 01/03/23 2345  BP: (!) 149/100 137/84 (!) 129/52 (!) 147/66  Pulse: 78 85 75 76  Resp: 20 20 (!) 21 20  Temp:      SpO2: 100% 100% 100% 99%  Weight:      Height:        General:  Alert and oriented times three, well developed and nourished, no acute distress Eyes: PERRLA, pink conjunctiva, no scleral icterus ENT: Moist oral mucosa, neck supple, no thyromegaly Lungs: clear to ascultation, no wheeze, no crackles, no use of accessory muscles Cardiovascular: regular rate and rhythm, no regurgitation, no gallops, no murmurs. No carotid bruits, no JVD Abdomen: soft, positive BS, non-tender, non-distended, no organomegaly, not an acute abdomen GU: not examined HCTZ Change Neuro: CN II - XII grossly intact, sensation intact Musculoskeletal: strength 5/5 all extremities, no clubbing, cyanosis. B/L  LE swelling, TTP, erythematous R>L, bullous on left Skin: no rash, no subcutaneous crepitation, no decubitus Psych: appropriate patient  Recent Labs    01/03/23 1949  NA 134*  K 3.5  CL 98  CO2 28  GLUCOSE 267*  BUN 22  CREATININE 1.21*  CALCIUM 8.4*   Recent Labs    01/03/23 1949  AST 13*  ALT 13  ALKPHOS 79  BILITOT 0.5  PROT 6.2*  ALBUMIN 2.4*    Recent Labs    01/03/23 1949  WBC 5.2  NEUTROABS 2.6  HGB 10.7*  HCT 34.3*  MCV 92.7  PLT 132*     Micro Results: Recent Results (from the past 240 hour(s))  Resp panel by RT-PCR (RSV, Flu A&B, Covid) Anterior Nasal Swab      Status: None   Collection Time: 01/03/23  9:49 PM   Specimen: Anterior Nasal Swab  Result Value Ref Range Status   SARS Coronavirus 2 by RT PCR NEGATIVE NEGATIVE Final    Comment: (NOTE) SARS-CoV-2 target nucleic acids are NOT DETECTED.  The SARS-CoV-2 RNA is generally detectable in upper respiratory specimens during the acute phase of infection. The lowest concentration of SARS-CoV-2 viral copies this assay can detect is 138 copies/mL. A negative result does not preclude SARS-Cov-2 infection and should not be used as the sole basis for treatment or other patient management decisions. A negative result may occur with  improper specimen collection/handling, submission of specimen other than nasopharyngeal swab, presence of viral mutation(s) within the areas targeted by this assay, and inadequate number of viral copies(<138 copies/mL). A negative result must be combined with clinical observations, patient history, and epidemiological information. The expected result is Negative.  Fact Sheet for Patients:  BloggerCourse.com  Fact Sheet for Healthcare Providers:  SeriousBroker.it  This test is no t yet approved or cleared by the [place] FDA and  has been authorized for detection and/or diagnosis of SARS-CoV-2 by FDA under an Emergency Use Authorization (EUA). This EUA will remain  in effect (meaning this test can be used) for the duration of the COVID-19 declaration under Section 564(b)(1) of the Act, 21 U.S.C.section 360bbb-3(b)(1), unless the authorization is terminated  or revoked sooner.       Influenza A by PCR NEGATIVE NEGATIVE Final   Influenza B by PCR NEGATIVE NEGATIVE Final    Comment: (NOTE) The Xpert Xpress SARS-CoV-2/FLU/RSV plus assay is intended as an aid in the diagnosis of influenza from Nasopharyngeal swab specimens and should  not be used as a sole basis for treatment. Nasal washings and aspirates are  unacceptable for Xpert Xpress SARS-CoV-2/FLU/RSV testing.  Fact Sheet for Patients: BloggerCourse.com  Fact Sheet for Healthcare Providers: SeriousBroker.it  This test is not yet approved or cleared by the [place] FDA and has been authorized for detection and/or diagnosis of SARS-CoV-2 by FDA under an Emergency Use Authorization (EUA). This EUA will remain in effect (meaning this test can be used) for the duration of the COVID-19 declaration under Section 564(b)(1) of the Act, 21 U.S.C. section 360bbb-3(b)(1), unless the authorization is terminated or revoked.     Resp Syncytial Virus by PCR NEGATIVE NEGATIVE Final    Comment: (NOTE) Fact Sheet for Patients: BloggerCourse.com  Fact Sheet for Healthcare Providers: SeriousBroker.it  This test is not yet approved or cleared by the [place] FDA and has been authorized for detection and/or diagnosis of SARS-CoV-2 by FDA under an Emergency Use Authorization (EUA). This EUA will remain in effect (meaning this test can be used) for the duration of the COVID-19 declaration under Section 564(b)(1) of the Act, 21 U.S.C. section 360bbb-3(b)(1), unless the authorization is terminated or revoked.  Performed at [facility], [address] [place], [place] [address]      Radiological Exams on Admission: CT Head Wo Contrast  Result Date: 01/03/2023 CLINICAL DATA:  Frequent falls with lower extremity swelling and shortness of breath. EXAM: CT HEAD WITHOUT CONTRAST TECHNIQUE: Contiguous axial images were obtained from the base of the skull through the vertex without intravenous contrast. RADIATION DOSE REDUCTION: This exam was performed according to the departmental dose-optimization program which includes automated exposure control, adjustment of the mA and/or kV according to patient size and/or use of iterative reconstruction  technique. COMPARISON:  July 13, 2022 FINDINGS: Brain: There is mild cerebral atrophy with widening of the extra-axial spaces and stable ventricular dilatation. There are areas of decreased attenuation within the white matter tracts of the supratentorial brain, consistent with microvascular disease changes. Vascular: No hyperdense vessel or unexpected calcification. Skull: Normal. Negative for fracture or focal lesion. Sinuses/Orbits: No acute finding. Other: None. IMPRESSION: 1. No acute intracranial abnormality. 2. Generalized cerebral atrophy with chronic white matter small vessel ischemic changes. Electronically Signed   By: Aram Candela M.D.   On: 01/03/2023 23:03   DG Chest 2 View  Result Date: 01/03/2023 CLINICAL DATA:  Shortness of breath and weakness. EXAM: CHEST - 2 VIEW COMPARISON:  July 13, 2022 FINDINGS: The heart size and mediastinal contours are within normal limits. Both lungs are clear. Radiopaque surgical clips are seen within the right upper quadrant. Stable spinal stimulator wire positioning is noted. The visualized skeletal structures are unremarkable. IMPRESSION: No active cardiopulmonary disease. Electronically Signed   By: Aram Candela M.D.   On: 01/03/2023 20:22    Assessment/Plan Present on Admission:  AMS (altered mental status)  -Admit to med telemetry -DDX: Polypharmacy, seizures, cellulitis, hyperammonemia, infection -Monitor   Cellulitis B/L LE erythema R>L -Cellulitis order set initiated -IV Rocephin  Fluid overload/Left sided HF -CHF order set initiated -IV Lasix, strict I's and O's and daily weights -Patient on room air -2D echo ordered  Polypharmacy -Patient maintained on fentanyl patch, Atarax, amitza, Percocets, Mirapex, Lyrica -Per husband the fentanyl patch is due for changing however he did not change as he did not want something that would worsen her mentation -meds held for now  Seizures -On Keppra and Depakote -Depakote level  subtherapeutic  Fall, initial encounter -PT consult  placed -treating lower extremity cellulitis, because of patient's pain   COPD (chronic obstructive pulmonary disease) (HCC) -Stable, continue Advair, albuterol MDI   Hypertension -Stable, resume Coreg,, losartan,   Chronic pain -Patient with pain stimulator does not work  -Maintained on fentanyl patch, Percocet, Lyrica, Cymbalta  Diabetes mellitus -On 70/30 PRN -Sliding scale insulin ordered  Dyslipidemia -Resume atorvastatin, Vascepa  GERD: -On Protonix and Pepcid   Sleep apnea, unspecified -Noncompliant with CPAP   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

## 2023-01-04 NOTE — ED Notes (Signed)
Bladder scan 75 cc

## 2023-01-04 NOTE — Consult Note (Signed)
Neurology Consultation  CC: altered mental status, seizure history   History is obtained from: patient, patient's husband  HPI: Brenda CorollaVenetia Morgan is a 67 y.o. female with a PMHx of generalized seizures, chronic pain, COPD, non-alcoholic fatty liver disease, HLD, HTN, OSA, GERD, who presents to ED due to altered mental status, leg weakness.   Patient is not able to contribute much to history. She states that she was brought to hospital due to having a seizure. She also states that she manages her own medications. She is unable to recall events leading up to hospitalization. She denies any recent alcohol use. She reports recent RLE pain.   Called husband who reports that he is the one who manages her medications. He reports he brought her in due to leg weakness and confusion. For example, she will state that she is getting up to go to the bathroom but will head somewhere that is not the bathroom. He reports that he gives her all her medications and that she has been compliant with her keppra, depakote, and lyrica. He reports that during her prior episodes of seizures, she will jerk and bounce, her arms are in a locked position, and she will roll to the right. He denies any episodes like this occurred between her prior hospitalization at [facility] and this current hospitalization.   ROS: A complete ROS was performed and is negative except as noted in the HPI.   Past Medical History:  Diagnosis Date   Acute metabolic encephalopathy    Acute respiratory failure with hypoxia and hypercapnia (HCC)    AKI (acute kidney injury) (HCC) 09/15/2020   Altered mental status    Anxiety    Arthritis    Cerebral atrophy, Head CT (HCC) 09/23/2020   CHF (congestive heart failure) (HCC)    Chronic pain 04/29/2022   COPD (chronic obstructive pulmonary disease) (HCC)    Diabetes mellitus without complication (HCC)    DM II (diabetes mellitus, type II), controlled (HCC) 08/01/2020   Fatty liver 2012   per pt 06/24/20    GERD (gastroesophageal reflux disease)    Heart murmur    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 Patient hospitalized in December 18-22, 2014 at [facility] for hypercapnia and hepatic encephalopathy.    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 Her smoking use included cigarettes. She smoked an average of 1 pack per day. She has never used smokeless tobacco. She  reports that she does not drink alcohol and does not use drugs.  Prior to Admission medications   Medication Sig Start Date End Date Taking? Authorizing Provider  atorvastatin (LIPITOR) 10 MG tablet Take 10 mg by mouth daily.   Yes [provider]  azelastine (ASTELIN) 0.1 % nasal spray Place 2 sprays into both nostrils as needed for rhinitis (congestion). 07/30/20  Yes [provider]  buprenorphine (BUTRANS) 15 MCG/HR Place 1 patch onto the skin once a week.   Yes [provider]  carvedilol (COREG) 25 MG tablet Take 25 mg  by mouth daily.   Yes [provider]  cephALEXin (KEFLEX) 500 MG capsule Take 1 capsule (500 mg total) by mouth 4 (four) times daily. 01/03/23  Yes Cathren Laine, MD  cetirizine (ZYRTEC) 10 MG tablet Take 10 mg by mouth daily.   Yes [provider]  diclofenac Sodium (VOLTAREN) 1 % GEL Apply 4 g topically 4 (four) times daily. Patient taking differently: Apply 1 Application topically as needed (pain). 09/23/20  Yes Melene Plan, DO  divalproex (DEPAKOTE) 500 MG DR tablet Take 500 mg by mouth 2 (two) times daily. 12/27/22  Yes [provider]  DULoxetine (CYMBALTA) 60 MG capsule Take 60 mg by mouth daily. 03/09/22  Yes [provider]  Empagliflozin-metFORMIN HCl ER (SYNJARDY XR) 25-1000 MG TB24 Take 1 tablet by mouth daily.   Yes [provider]  famotidine (PEPCID) 40 MG tablet Take 40 mg by mouth daily. 09/09/20  Yes [provider]  Fluticasone-Salmeterol (ADVAIR) 250-50 MCG/DOSE AEPB Inhale 1 puff into the lungs 2 (two) times daily as needed (shortness of breath/wheezing).    Yes [provider]  furosemide (LASIX) 40 MG tablet Take 40 mg by mouth daily.   Yes [provider]  hydrOXYzine (ATARAX/VISTARIL) 25 MG tablet Take 25 mg by mouth at bedtime. 07/30/20  Yes [provider]  icosapent Ethyl (VASCEPA) 1 g capsule Take 2 g by mouth 2 (two) times daily.   Yes [provider]  lactulose (CHRONULAC) 10 GM/15ML solution Take by mouth 2 (two) times daily. 01/04/23  Yes [provider]  levETIRAcetam (KEPPRA) 1000 MG tablet Take 1,000 mg by mouth 2 (two) times daily.   Yes [provider]  losartan (COZAAR) 25 MG tablet Take 25 mg by mouth daily. 09/28/20  Yes [provider]  lubiprostone (AMITIZA) 24 MCG capsule Take 24 mcg by mouth daily. 09/25/21  Yes [provider]  metolazone (ZAROXOLYN) 5 MG tablet Take 5 mg by mouth 3 (three) times a week. Sunday,Wednesday,friday   Yes  [provider]  OZEMPIC, 1 MG/DOSE, 4 MG/3ML SOPN Inject 1 mg into the skin once a week. Take on Sunday 09/14/20  Yes [provider]  pantoprazole (PROTONIX) 40 MG tablet Take 40 mg by mouth daily.   Yes [provider]  pioglitazone (ACTOS) 15 MG tablet Take 15 mg by mouth daily.   Yes [provider]  potassium chloride SA (KLOR-CON) 20 MEQ tablet Take 20 mEq by mouth daily.   Yes [provider]  pramipexole (MIRAPEX) 0.5 MG tablet Take 1 mg by mouth at bedtime.   Yes [provider]  pregabalin (LYRICA) 200 MG capsule Take 200 mg by mouth 2 (two) times daily.   Yes [provider]  spironolactone (ALDACTONE) 50 MG tablet Take 50 mg by mouth daily.   Yes [provider]  traZODone (DESYREL) 100 MG tablet Take 50-100 mg by mouth at bedtime  as needed for sleep.   Yes [provider]  lamoTRIgine 200 MG TBDP Take 1 tablet (200 mg total) by mouth daily. Patient not taking: Reported on 01/04/2023 07/17/22   Windell Norfolk, MD   Exam: Current vital signs: BP (!) 159/101   Pulse 76   Temp 97.7 F (36.5 C) (Oral)   Resp 15   Ht 5\' 6"  (1.676 m)   Wt 94.3 kg   SpO2 94%   BMI 33.57 kg/m   Physical Exam  Constitutional: Appears well-developed and well-nourished.  Psych: Affect appropriate to situation Eyes: No scleral injection, wearing glasses. HENT: No OP obstrucion Head: Normocephalic.  Respiratory: Effort normal, non-labored breathing GI: Soft.  No distension. There is no tenderness.  Skin: erythema, tenderness noted on BLE. Pustule on RLE.   Neuro: Mental Status: Patient is awake, alert. She is oriented to self and place. She is not oriented month, year, and situation. Patient is unable to give a coherent history. No signs of aphasia or neglect Cranial Nerves: II: Visual Fields are full. Pupils are equal, round, and reactive to light.   III,IV, VI: EOMI without ptosis or diplopia.  V: Facial  sensation is symmetric to light touch. VII: Facial movement is symmetric. Slight L facial droop. VIII: hearing is intact to voice X: Uvula elevates symmetrically XI: Shoulder shrug is symmetric. XII: tongue is midline without atrophy or fasciculations.  Motor: Tone is normal. Bulk is normal.  5/5 strength present in BUE.  4/5 strength in BLE. Sensory: Sensation is symmetric to light touch in the arms. Deep Tendon Reflexes: 2+ and symmetric in the biceps and patellae.  Plantars: Toes are downgoing bilaterally.  Cerebellar: FNF intact bilaterally with mild intention tremor.   I have reviewed labs in epic and the pertinent results are:  No results found for: "CBC", "CHOL"  Results for orders placed or performed during the hospital encounter of 01/03/23 (from the past 48 hour(s))  CBC with Differential     Status: Abnormal   Collection Time: 01/03/23  7:49 PM  Result Value Ref Range   WBC 5.2 4.0 - 10.5 K/uL   RBC 3.70 (L) 3.87 - 5.11 MIL/uL   Hemoglobin 10.7 (L) 12.0 - 15.0 g/dL   HCT 03/04/23 (L) 08.6 - 57.8 %   MCV 92.7 80.0 - 100.0 fL   MCH 28.9 26.0 - 34.0 pg   MCHC 31.2 30.0 - 36.0 g/dL   RDW 46.9 62.9 - 52.8 %   Platelets 132 (L) 150 - 400 K/uL   nRBC 0.0 0.0 - 0.2 %   Neutrophils Relative % 49 %   Neutro Abs 2.6 1.7 - 7.7 K/uL   Lymphocytes Relative 31 %   Lymphs Abs 1.6 0.7 - 4.0 K/uL   Monocytes Relative 15 %   Monocytes Absolute 0.8 0.1 - 1.0 K/uL   Eosinophils Relative 3 %   Eosinophils Absolute 0.1 0.0 - 0.5 K/uL   Basophils Relative 1 %   Basophils Absolute 0.0 0.0 - 0.1 K/uL   Immature Granulocytes 1 %   Abs Immature Granulocytes 0.07 0.00 - 0.07 K/uL    Comment: Performed at [facility], [address], [place], [place]  Brain natriuretic peptide     Status: Abnormal   Collection Time: 01/03/23  7:49 PM  Result Value Ref Range   B Natriuretic Peptide 117.9 (H) 0.0 - 100.0 pg/mL    Comment: Performed at [facility], [address] [place], [place]  Comprehensive metabolic panel     Status: Abnormal   Collection Time: 01/03/23  7:49 PM  Result Value Ref Range   Sodium 134 (L) 135 - 145 mmol/L   Potassium 3.5 3.5 - 5.1 mmol/L   Chloride 98 98 - 111 mmol/L   CO2 28 22 - 32 mmol/L   Glucose, Bld 267 (H) 70 - 99 mg/dL    Comment: Glucose reference range applies only to samples taken after fasting for at least 8 hours.   BUN 22 8 - 23 mg/dL   Creatinine, Ser 1.21 (H) 0.44 - 1.00 mg/dL   Calcium 8.4 (L) 8.9 - 10.3 mg/dL   Total Protein 6.2 (L) 6.5 - 8.1 g/dL   Albumin 2.4 (L) 3.5 - 5.0 g/dL   AST 13 (L) 15 - 41 U/L   ALT 13 0 - 44 U/L   Alkaline Phosphatase 79 38 - 126 U/L   Total Bilirubin 0.5 0.3 - 1.2 mg/dL   GFR, Estimated 50 (L) >60 mL/min    Comment: (NOTE) Calculated using the CKD-EPI Creatinine Equation (2021)    Anion gap 8 5 - 15    Comment: Performed at [facility], [place] [address], [place], [place] [address]  Urinalysis, Routine w reflex microscopic Urine, Clean Catch     Status: Abnormal   Collection Time: 01/03/23  9:43 PM  Result Value Ref Range   Color, Urine YELLOW YELLOW   APPearance HAZY (A) CLEAR   Specific Gravity, Urine 1.025 1.005 - 1.030   pH 5.0 5.0 - 8.0   Glucose, UA 150 (A) NEGATIVE mg/dL   Hgb urine dipstick SMALL (A) NEGATIVE   Bilirubin Urine NEGATIVE NEGATIVE   Ketones, ur NEGATIVE NEGATIVE mg/dL   Protein, ur >=300 (A) NEGATIVE mg/dL   Nitrite NEGATIVE NEGATIVE   Leukocytes,Ua NEGATIVE NEGATIVE   RBC / HPF 0-5 0 - 5 RBC/hpf   WBC, UA 11-20 0 - 5 WBC/hpf   Bacteria, UA RARE (A) NONE SEEN   Squamous Epithelial / HPF 0-5 0 - 5 /HPF   Mucus PRESENT     Comment: Performed at [facility] [address], [place], [place] [address]  Resp panel by RT-PCR (RSV, Flu A&B, Covid) Anterior Nasal Swab     Status: None   Collection Time: 01/03/23  9:49 PM   Specimen: Anterior Nasal Swab  Result Value Ref Range   SARS Coronavirus 2 by RT PCR NEGATIVE NEGATIVE     Comment: (NOTE) SARS-CoV-2 target nucleic acids are NOT DETECTED.  The SARS-CoV-2 RNA is generally detectable in upper respiratory specimens during the acute phase of infection. The lowest concentration of SARS-CoV-2 viral copies this assay can detect is 138 copies/mL. A negative result does not preclude SARS-Cov-2 infection and should not be used as the sole basis for treatment or other patient management decisions. A negative result may occur with  improper specimen collection/handling, submission of specimen other than nasopharyngeal swab, presence of viral mutation(s) within the areas targeted by this assay, and inadequate number of viral copies(<138 copies/mL). A negative result must be combined with clinical observations, patient history, and epidemiological information. The expected result is Negative.  Fact Sheet for Patients:  EntrepreneurPulse.com.au  Fact Sheet for Healthcare Providers:  IncredibleEmployment.be  This test is no t yet approved or cleared by the [place] FDA and  has been authorized for detection and/or diagnosis of SARS-CoV-2 by FDA under an Emergency Use Authorization (EUA). This EUA will remain  in effect (meaning  this test can be used) for the duration of the COVID-19 declaration under Section 564(b)(1) of the Act, 21 U.S.C.section 360bbb-3(b)(1), unless the authorization is terminated  or revoked sooner.       Influenza A by PCR NEGATIVE NEGATIVE   Influenza B by PCR NEGATIVE NEGATIVE    Comment: (NOTE) The Xpert Xpress SARS-CoV-2/FLU/RSV plus assay is intended as an aid in the diagnosis of influenza from Nasopharyngeal swab specimens and should not be used as a sole basis for treatment. Nasal washings and aspirates are unacceptable for Xpert Xpress SARS-CoV-2/FLU/RSV testing.  Fact Sheet for Patients: EntrepreneurPulse.com.au  Fact Sheet for Healthcare  Providers: IncredibleEmployment.be  This test is not yet approved or cleared by the [place] FDA and has been authorized for detection and/or diagnosis of SARS-CoV-2 by FDA under an Emergency Use Authorization (EUA). This EUA will remain in effect (meaning this test can be used) for the duration of the COVID-19 declaration under Section 564(b)(1) of the Act, 21 U.S.C. section 360bbb-3(b)(1), unless the authorization is terminated or revoked.     Resp Syncytial Virus by PCR NEGATIVE NEGATIVE    Comment: (NOTE) Fact Sheet for Patients: EntrepreneurPulse.com.au  Fact Sheet for Healthcare Providers: IncredibleEmployment.be  This test is not yet approved or cleared by the [place] FDA and has been authorized for detection and/or diagnosis of SARS-CoV-2 by FDA under an Emergency Use Authorization (EUA). This EUA will remain in effect (meaning this test can be used) for the duration of the COVID-19 declaration under Section 564(b)(1) of the Act, 21 U.S.C. section 360bbb-3(b)(1), unless the authorization is terminated or revoked.  Performed at [facility], [place] [address] [place], [place] [address]   Valproic acid level     Status: Abnormal   Collection Time: 01/03/23 10:00 PM  Result Value Ref Range   Valproic Acid Lvl 23 (L) 50.0 - 100.0 ug/mL    Comment: Performed at [facility] [address], [place], [place] [address]  Ammonia     Status: None   Collection Time: 01/03/23 10:00 PM  Result Value Ref Range   Ammonia 26 9 - 35 umol/L    Comment: Performed at [facility], [place] [address] [place], [place] [address]  CK     Status: None   Collection Time: 01/04/23  5:10 AM  Result Value Ref Range   Total CK 48 38 - 234 U/L    Comment: Performed at [facility], [place] [address] [place], [place] [address]  Basic metabolic panel     Status: Abnormal   Collection Time: 01/04/23  5:10 AM   Result Value Ref Range   Sodium 139 135 - 145 mmol/L   Potassium 3.4 (L) 3.5 - 5.1 mmol/L   Chloride 100 98 - 111 mmol/L   CO2 29 22 - 32 mmol/L   Glucose, Bld 166 (H) 70 - 99 mg/dL    Comment: Glucose reference range applies only to samples taken after fasting for at least 8 hours.   BUN 19 8 - 23 mg/dL   Creatinine, Ser 1.02 (H) 0.44 - 1.00 mg/dL   Calcium 8.3 (L) 8.9 - 10.3 mg/dL   GFR, Estimated >60 >60 mL/min    Comment: (NOTE) Calculated using the CKD-EPI Creatinine Equation (2021)    Anion gap 10 5 - 15    Comment: Performed at [facility] [address], [place], [place] [address]  Vitamin B12     Status: None  Collection Time: 01/04/23  5:10 AM  Result Value Ref Range   Vitamin B-12 504 180 - 914 pg/mL    Comment: HEMOLYSIS AT THIS LEVEL MAY AFFECT RESULT (NOTE) This assay is not validated for testing neonatal or myeloproliferative syndrome specimens for Vitamin B12 levels. Performed at [facility], [address], [place], [place] [address]   TSH     Status: None   Collection Time: 01/04/23  5:10 AM  Result Value Ref Range   TSH 3.512 0.350 - 4.500 uIU/mL    Comment: Performed by a 3rd Generation assay with a functional sensitivity of <=0.01 uIU/mL. Performed at [facility], [address], [place], [place] [address]   Hemoglobin A1c     Status: Abnormal   Collection Time: 01/04/23  5:10 AM  Result Value Ref Range   Hgb A1c MFr Bld 7.2 (H) 4.8 - 5.6 %    Comment: QR SPECIMEN CLOTTED NOTIFIED M. ROBERTS, RN 0720 01/04/2023 BY MACEDA,J. (NOTE) Pre diabetes:          5.7%-6.4%  Diabetes:              >6.4%  Glycemic control for   <7.0% adults with diabetes    Mean Plasma Glucose 159.94 mg/dL    Comment: QUESTIONABLE RESULTS, RECOMMEND RECOLLECT TO VERIFY SPECIMEN CLOTTED NOTIFIED Earleen Reaper, RN 0720 01/04/2023 BY MACEDA,J. Performed at [facility], [address] [place], [place] [address]   CBG monitoring, ED     Status:  Abnormal   Collection Time: 01/04/23  7:44 AM  Result Value Ref Range   Glucose-Capillary 155 (H) 70 - 99 mg/dL    Comment: Glucose reference range applies only to samples taken after fasting for at least 8 hours.  HIV Antibody (routine testing w rflx)     Status: None   Collection Time: 01/04/23 10:40 AM  Result Value Ref Range   HIV Screen 4th Generation wRfx Non Reactive Non Reactive    Comment: Performed at [facility], [address] [place], [place] [address]  RPR     Status: None   Collection Time: 01/04/23 10:40 AM  Result Value Ref Range   RPR Ser Ql NON REACTIVE NON REACTIVE    Comment: Performed at [facility], [address] [place], [place] [address]  MRSA Next Gen by PCR, Nasal     Status: None   Collection Time: 01/04/23 10:43 AM   Specimen: Nasal Mucosa; Nasal Swab  Result Value Ref Range   MRSA by PCR Next Gen NOT DETECTED NOT DETECTED    Comment: (NOTE) The GeneXpert MRSA Assay (FDA approved for NASAL specimens only), is one component of a comprehensive MRSA colonization surveillance program. It is not intended to diagnose MRSA infection nor to guide or monitor treatment for MRSA infections. Test performance is not FDA approved in patients less than 66 years old. Performed at [facility], [address], [place], [place] [address]   Glucose, capillary     Status: Abnormal   Collection Time: 01/04/23 11:51 AM  Result Value Ref Range   Glucose-Capillary 169 (H) 70 - 99 mg/dL    Comment: Glucose reference range applies only to samples taken after fasting for at least 8 hours.    CT Head Wo Contrast  Result Date: 01/03/2023 CLINICAL DATA:  Frequent falls with lower extremity swelling and shortness of breath. EXAM: CT HEAD WITHOUT CONTRAST TECHNIQUE: Contiguous axial images were obtained from  the base of the skull through the vertex without intravenous contrast. RADIATION DOSE REDUCTION: This exam was performed according to the departmental  dose-optimization program which includes automated exposure control, adjustment of the mA and/or kV according to patient size and/or use of iterative reconstruction technique. COMPARISON:  July 13, 2022 FINDINGS: Brain: There is mild cerebral atrophy with widening of the extra-axial spaces and stable ventricular dilatation. There are areas of decreased attenuation within the white matter tracts of the supratentorial brain, consistent with microvascular disease changes. Vascular: No hyperdense vessel or unexpected calcification. Skull: Normal. Negative for fracture or focal lesion. Sinuses/Orbits: No acute finding. Other: None. IMPRESSION: 1. No acute intracranial abnormality. 2. Generalized cerebral atrophy with chronic white matter small vessel ischemic changes. Electronically Signed   By: Aram Candela M.D.   On: 01/03/2023 23:03   DG Chest 2 View  Result Date: 01/03/2023 CLINICAL DATA:  Shortness of breath and weakness. EXAM: CHEST - 2 VIEW COMPARISON:  July 13, 2022 FINDINGS: The heart size and mediastinal contours are within normal limits. Both lungs are clear. Radiopaque surgical clips are seen within the right upper quadrant. Stable spinal stimulator wire positioning is noted. The visualized skeletal structures are unremarkable. IMPRESSION: No active cardiopulmonary disease. Electronically Signed   By: Aram Candela M.D.   On: 01/03/2023 20:22     Primary Diagnosis:  Delirium (r/o seizure)  Secondary Diagnosis: Essential (primary) hypertension, Type 2 diabetes mellitus with hyperglycemia , and Obesity  Impression:  Latonda Larrivee is a 67 y.o. female with a PMHx of generalized seizures, chronic pain, COPD, non-alcoholic fatty liver disease, HLD, HTN, OSA, GERD, who presents to ED due to altered mental status, leg weakness. Per husband, it does not sound like the events leading up to her hospitalization were consistent with her prior history of seizures. Suspect there may be some mild  dementia at baseline given her husband is managing her medications, however would not be able to completely assess this during this hospitalization. She likely has delirium superimposed on mild dementia, especially in the setting of her cellulitis which is being treated. Hopeful her mental status will improve with treatment of this underlying infection. Will also obtain EEG to r/o seizure. Would not make any changes to her seizure medication regimen.   Recommendations: -treat cellulitis  -EEG (r/o seizure) -continue keppra 1000 BID -continue depakote DR 500 Q12H -delirium precautions -minimize deliriogenic medications, promote circadian rhythm and sleep-wake cycle  Karie Fetch, MD, PGY-1  I have seen the patient and reviewed the above note.  My suspicion is that this represents an acute on chronic encephalopathy secondary to her acute infection.  At this time, no significant concerns for ongoing seizure or medication related effects.  I do think an EEG would not be unreasonable, but if this is negative, then I would favor just treating her seizure and I would expect her encephalopathy to gradually improve over time.  No changes to her current seizure medications, neurology will be available on an as-needed basis if her EEG is negative.  Ritta Slot, MD [facility] [phone]  If 7pm- 7am, please page neurology on call as listed in AMION.

## 2023-01-04 NOTE — Progress Notes (Signed)
EEG complete - results pending 

## 2023-01-04 NOTE — Progress Notes (Signed)
Heart Failure Navigator Progress Note  Assessed for Heart & Vascular TOC clinic readiness.  Patient with memory impairment, lives in [place].   Navigator available for reassessment of patient.   Earnestine Leys, BSN, Clinical cytogeneticist Only

## 2023-01-04 NOTE — Progress Notes (Signed)
  Echocardiogram 2D Echocardiogram has been performed.  Brenda Morgan 01/04/2023, 2:03 PM

## 2023-01-04 NOTE — Progress Notes (Signed)
PROGRESS NOTE    Meryl Hubers  XTG:[id number] DOB: 04/02/1956 DOA: 01/03/2023 PCP: Jennings Books, NP   Brief Narrative: 67 year old with past medical history significant for seizure, heart failure preserved ejection fraction, diabetes type 2, hypertension, COPD, PVD, CKD, OSA not on CPAP, chronic pain, fibromyalgia on opioids, NASH, past EtOH abuse, unintentional Tylenol overdose 2012, presents accompanied by husband due to altered mental status, and unsteady gait.  Patient has fallen twice the day of admission and once the day prior to admission.  She  has also been complaining lower extremity pain.    Assessment & Plan:   Principal Problem:   Altered mental status Active Problems:   COPD (chronic obstructive pulmonary disease) (HCC)   DM II (diabetes mellitus, type II), controlled ([place])   Hypertension associated with type 2 diabetes mellitus (HCC)   AMS (altered mental status)   Chronic pain   Restless legs syndrome   Sleep apnea, unspecified   Seizure disorder (HCC)   History of cerebral infarction   Hepatic fibrosis due to nonalcoholic fatty liver disease   Spinal cord stimulator present   Cellulitis   1-Acute metabolic encephalopathy Could be related to polypharmacy, infectious process. Neurology consulted to assess with management of medications for seizures, and further  evaluation.   2-Bilateral lower extremity cellulitis worse on the right with blister Change antibiotics to IV vancomycin  3-Seizure: I have resumed Keppra 1 g twice daily and Depakote 500 mg twice daily. Neurology consulted for further recommendation of medications  4-Frequent Fall: Patient with frequent fall, she will need PT OT evaluation Reports dizziness on ambulation.  Orthostatic vitals ordered  5-Acute on Chronic Diastolic HF;  Mild elevated BNP, LE edema.  Plan to transition to oral Lasix, concern with orthostatic hypotension.   COPD; continue with Advair and Albuterol/    HTN;  Monitor on losartan and coreg.   Chronic Pain:  Hold fentanyl  Resume Cymbalta and lyrica.   Diabetes;  Continue with SSI.   Sleep apnea; no compliant with CPAP.   NASH Ammonia level normal.       Estimated body mass index is 33.57 kg/m as calculated from the following:   Height as of this encounter: 5\' 6"  (1.676 m).   Weight as of this encounter: 94.3 kg.   DVT prophylaxis: Heparin Code Status: Full code Family Communication: Disposition Plan:  Status is: Inpatient Remains inpatient appropriate because: Management of cellulitis and encephalopathy    Consultants:  Neurology   Procedures:    Antimicrobials:    Subjective: She is alert, she was able to tell me she was in the hospital, her age, was confuse abut date. She relates she has been falling at home and report dizziness on ambulation.   Objective: Vitals:   01/04/23 0200 01/04/23 0230 01/04/23 0406 01/04/23 0746  BP: (!) 142/81  (!) 149/65   Pulse: 75  75   Resp: 15  18   Temp:  98.6 F (37 C) 97.7 F (36.5 C) 97.7 F (36.5 C)  TempSrc:  Oral Oral Oral  SpO2: 100%  94%   Weight:      Height:       No intake or output data in the 24 hours ending 01/04/23 0829 Filed Weights   01/03/23 1941  Weight: 94.3 kg    Examination:  General exam: Appears calm and comfortable  Respiratory system: Clear to auscultation. Respiratory effort normal. Cardiovascular system: S1 & S2 heard, RRR. No JVD, murmurs, rubs, gallops or clicks. No pedal  edema. Gastrointestinal system: Abdomen is nondistended, soft and nontender. No organomegaly or masses felt. Normal bowel sounds heard. Central nervous system: Alert and oriented.  Extremities: Symmetric 5 x 5 power.    Data Reviewed: I have personally reviewed following labs and imaging studies  CBC: Recent Labs  Lab 01/03/23 1949  WBC 5.2  NEUTROABS 2.6  HGB 10.7*  HCT 34.3*  MCV 92.7  PLT 132*   Basic Metabolic Panel: Recent Labs  Lab  01/03/23 1949 01/04/23 0510  NA 134* 139  K 3.5 3.4*  CL 98 100  CO2 28 29  GLUCOSE 267* 166*  BUN 22 19  CREATININE 1.21* 1.02*  CALCIUM 8.4* 8.3*   GFR: Estimated Creatinine Clearance: 63.6 mL/min (A) (by C-G formula based on SCr of 1.02 mg/dL (H)). Liver Function Tests: Recent Labs  Lab 01/03/23 1949  AST 13*  ALT 13  ALKPHOS 79  BILITOT 0.5  PROT 6.2*  ALBUMIN 2.4*   No results for input(s): "LIPASE", "AMYLASE" in the last 168 hours. Recent Labs  Lab 01/03/23 2200  AMMONIA 26   Coagulation Profile: No results for input(s): "INR", "PROTIME" in the last 168 hours. Cardiac Enzymes: Recent Labs  Lab 01/04/23 0510  CKTOTAL 48   BNP (last 3 results) No results for input(s): "PROBNP" in the last 8760 hours. HbA1C: Recent Labs    01/04/23 0510  HGBA1C 7.2*   CBG: Recent Labs  Lab 01/04/23 0744  GLUCAP 155*   Lipid Profile: No results for input(s): "CHOL", "HDL", "LDLCALC", "TRIG", "CHOLHDL", "LDLDIRECT" in the last 72 hours. Thyroid Function Tests: Recent Labs    01/04/23 0510  TSH 3.512   Anemia Panel: Recent Labs    01/04/23 0510  VITAMINB12 504   Sepsis Labs: No results for input(s): "PROCALCITON", "LATICACIDVEN" in the last 168 hours.  Recent Results (from the past 240 hour(s))  Resp panel by RT-PCR (RSV, Flu A&B, Covid) Anterior Nasal Swab     Status: None   Collection Time: 01/03/23  9:49 PM   Specimen: Anterior Nasal Swab  Result Value Ref Range Status   SARS Coronavirus 2 by RT PCR NEGATIVE NEGATIVE Final    Comment: (NOTE) SARS-CoV-2 target nucleic acids are NOT DETECTED.  The SARS-CoV-2 RNA is generally detectable in upper respiratory specimens during the acute phase of infection. The lowest concentration of SARS-CoV-2 viral copies this assay can detect is 138 copies/mL. A negative result does not preclude SARS-Cov-2 infection and should not be used as the sole basis for treatment or other patient management decisions. A  negative result may occur with  improper specimen collection/handling, submission of specimen other than nasopharyngeal swab, presence of viral mutation(s) within the areas targeted by this assay, and inadequate number of viral copies(<138 copies/mL). A negative result must be combined with clinical observations, patient history, and epidemiological information. The expected result is Negative.  Fact Sheet for Patients:  BloggerCourse.com  Fact Sheet for Healthcare Providers:  SeriousBroker.it  This test is no t yet approved or cleared by the [place] FDA and  has been authorized for detection and/or diagnosis of SARS-CoV-2 by FDA under an Emergency Use Authorization (EUA). This EUA will remain  in effect (meaning this test can be used) for the duration of the COVID-19 declaration under Section 564(b)(1) of the Act, 21 U.S.C.section 360bbb-3(b)(1), unless the authorization is terminated  or revoked sooner.       Influenza A by PCR NEGATIVE NEGATIVE Final   Influenza B by PCR NEGATIVE NEGATIVE Final  Comment: (NOTE) The Xpert Xpress SARS-CoV-2/FLU/RSV plus assay is intended as an aid in the diagnosis of influenza from Nasopharyngeal swab specimens and should not be used as a sole basis for treatment. Nasal washings and aspirates are unacceptable for Xpert Xpress SARS-CoV-2/FLU/RSV testing.  Fact Sheet for Patients: EntrepreneurPulse.com.au  Fact Sheet for Healthcare Providers: IncredibleEmployment.be  This test is not yet approved or cleared by the [place] FDA and has been authorized for detection and/or diagnosis of SARS-CoV-2 by FDA under an Emergency Use Authorization (EUA). This EUA will remain in effect (meaning this test can be used) for the duration of the COVID-19 declaration under Section 564(b)(1) of the Act, 21 U.S.C. section 360bbb-3(b)(1), unless the authorization  is terminated or revoked.     Resp Syncytial Virus by PCR NEGATIVE NEGATIVE Final    Comment: (NOTE) Fact Sheet for Patients: EntrepreneurPulse.com.au  Fact Sheet for Healthcare Providers: IncredibleEmployment.be  This test is not yet approved or cleared by the [place] FDA and has been authorized for detection and/or diagnosis of SARS-CoV-2 by FDA under an Emergency Use Authorization (EUA). This EUA will remain in effect (meaning this test can be used) for the duration of the COVID-19 declaration under Section 564(b)(1) of the Act, 21 U.S.C. section 360bbb-3(b)(1), unless the authorization is terminated or revoked.  Performed at [facility], [place] [address] [place], [place] [address]          Radiology Studies: CT Head Wo Contrast  Result Date: 01/03/2023 CLINICAL DATA:  Frequent falls with lower extremity swelling and shortness of breath. EXAM: CT HEAD WITHOUT CONTRAST TECHNIQUE: Contiguous axial images were obtained from the base of the skull through the vertex without intravenous contrast. RADIATION DOSE REDUCTION: This exam was performed according to the departmental dose-optimization program which includes automated exposure control, adjustment of the mA and/or kV according to patient size and/or use of iterative reconstruction technique. COMPARISON:  July 13, 2022 FINDINGS: Brain: There is mild cerebral atrophy with widening of the extra-axial spaces and stable ventricular dilatation. There are areas of decreased attenuation within the white matter tracts of the supratentorial brain, consistent with microvascular disease changes. Vascular: No hyperdense vessel or unexpected calcification. Skull: Normal. Negative for fracture or focal lesion. Sinuses/Orbits: No acute finding. Other: None. IMPRESSION: 1. No acute intracranial abnormality. 2. Generalized cerebral atrophy with chronic white matter small vessel ischemic changes.  Electronically Signed   By: Virgina Norfolk M.D.   On: 01/03/2023 23:03   DG Chest 2 View  Result Date: 01/03/2023 CLINICAL DATA:  Shortness of breath and weakness. EXAM: CHEST - 2 VIEW COMPARISON:  July 13, 2022 FINDINGS: The heart size and mediastinal contours are within normal limits. Both lungs are clear. Radiopaque surgical clips are seen within the right upper quadrant. Stable spinal stimulator wire positioning is noted. The visualized skeletal structures are unremarkable. IMPRESSION: No active cardiopulmonary disease. Electronically Signed   By: Virgina Norfolk M.D.   On: 01/03/2023 20:22        Scheduled Meds:  cephALEXin  500 mg Oral Once   famotidine  40 mg Oral QHS   furosemide  40 mg Intravenous BID   heparin  5,000 Units Subcutaneous Q8H   insulin aspart  0-15 Units Subcutaneous TID WC   insulin aspart  0-5 Units Subcutaneous QHS   ramipril  2.5 mg Oral Q12H   sodium chloride flush  3 mL Intravenous Q12H   Continuous Infusions:  sodium chloride     cefTRIAXone (ROCEPHIN)  IV  LOS: 0 days    Time spent: 35 minutes.     Alba Cory, MD [facility]   If 7PM-7AM, please contact night-coverage [email or web address]  01/04/2023, 8:29 AM

## 2023-01-04 NOTE — Procedures (Signed)
Patient Name: Brenda Morgan  MRN: [id number]  Epilepsy Attending: Lora Havens  Referring Physician/Provider: Rolanda Lundborg, MD  Date: 01/04/2023 Duration: 21.34 mins  Patient history: 67yo F with ams. EEG to evaluate for seizure  Level of alertness: Awake  AEDs during EEG study: LEV, PGB, VPA  Technical aspects: This EEG study was done with scalp electrodes positioned according to the 10-20 International system of electrode placement. Electrical activity was reviewed with band pass filter of 1-70Hz , sensitivity of 7 uV/mm, display speed of 25mm/sec with a 60Hz  notched filter applied as appropriate. EEG data were recorded continuously and digitally stored.  Video monitoring was available and reviewed as appropriate.  Description: The posterior dominant rhythm consists of 10 Hz activity of moderate voltage (25-35 uV) seen predominantly in posterior head regions, symmetric and reactive to eye opening and eye closing. Hyperventilation and photic stimulation were not performed.     IMPRESSION: This study is within normal limits. No seizures or epileptiform discharges were seen throughout the recording.  A normal interictal EEG does not exclude the diagnosis of epilepsy.  Sloan Galentine [name]

## 2023-01-04 NOTE — ED Notes (Signed)
Got patient up to the bedside toilet patient did well patient is now back in bed on the monitor with call bell in reach placed the external cath and a brief changed patient sheets patient is resting with call bell in reach

## 2023-01-04 NOTE — Progress Notes (Addendum)
Pharmacy Antibiotic Note  Brenda Morgan is a 67 y.o. female admitted on 01/03/2023 with cellulitis of bilateral lower extremities.  Pharmacy has been consulted for Vancomycin dosing for 7 days.  Pt received Cefazolin 1g IV x1 at 00:38 on 1/12 and Ceftriaxone 1g IV x1 at 08:31 on 1/12.   WBC 5.2, afebrile SCr 1.02 (b/l ~0.7-0.8)  Plan: Initiate loading dose of Vancomycin 2000mg  IV x 1, followed by  Vancomycin 1000mg  IV q24h (eAUC ~450) through 01/10/23    > Goal AUC 400-550    > Check vancomycin levels at steady state  Monitor daily CBC, temp, SCr, and for clinical signs of improvement  F/u cultures and de-escalate antibiotics as able   Height: 5\' 6"  (167.6 cm) Weight: 94.3 kg (208 lb) IBW/kg (Calculated) : 59.3  Temp (24hrs), Avg:98.1 F (36.7 C), Min:97.7 F (36.5 C), Max:98.6 F (37 C)  Recent Labs  Lab 01/03/23 1949 01/04/23 0510  WBC 5.2  --   CREATININE 1.21* 1.02*    Estimated Creatinine Clearance: 63.6 mL/min (A) (by C-G formula based on SCr of 1.02 mg/dL (H)).    Allergies  Allergen Reactions   Amlodipine Anaphylaxis, Swelling and Other (See Comments)    Tongue swelling    Naltrexone Hives   Penicillins Hives and Other (See Comments)    Tolerated ceftriaxone and cefepime 06/2017 Hives     Penicillin G     Antimicrobials this admission: Cefazolin 1/12 x1 Ceftriaxone 1/12 x1 Vancomycin 1/12 >> (1/18)  Dose adjustments this admission: N/A  Microbiology results: 1/12 UCx: pending   Thank you for allowing pharmacy to be a part of this patient's care.  Luisa Hart, PharmD, BCPS Clinical Pharmacist 01/04/2023 9:22 AM   Please refer to AMION for pharmacy phone number

## 2023-01-05 DIAGNOSIS — G9341 Metabolic encephalopathy: Secondary | ICD-10-CM | POA: Diagnosis not present

## 2023-01-05 DIAGNOSIS — Z79899 Other long term (current) drug therapy: Secondary | ICD-10-CM | POA: Diagnosis not present

## 2023-01-05 LAB — BLOOD GAS, ARTERIAL
Acid-Base Excess: 5.8 mmol/L — ABNORMAL HIGH (ref 0.0–2.0)
Bicarbonate: 31.7 mmol/L — ABNORMAL HIGH (ref 20.0–28.0)
Drawn by: 28338
O2 Saturation: 97.3 %
Patient temperature: 36.5
pCO2 arterial: 49 mmHg — ABNORMAL HIGH (ref 32–48)
pH, Arterial: 7.42 (ref 7.35–7.45)
pO2, Arterial: 74 mmHg — ABNORMAL LOW (ref 83–108)

## 2023-01-05 LAB — CBC
HCT: 31.7 % — ABNORMAL LOW (ref 36.0–46.0)
Hemoglobin: 10.1 g/dL — ABNORMAL LOW (ref 12.0–15.0)
MCH: 29.2 pg (ref 26.0–34.0)
MCHC: 31.9 g/dL (ref 30.0–36.0)
MCV: 91.6 fL (ref 80.0–100.0)
Platelets: 126 10*3/uL — ABNORMAL LOW (ref 150–400)
RBC: 3.46 MIL/uL — ABNORMAL LOW (ref 3.87–5.11)
RDW: 14.1 % (ref 11.5–15.5)
WBC: 5.4 10*3/uL (ref 4.0–10.5)
nRBC: 0 % (ref 0.0–0.2)

## 2023-01-05 LAB — URINE CULTURE: Culture: NO GROWTH

## 2023-01-05 LAB — BASIC METABOLIC PANEL
Anion gap: 8 (ref 5–15)
BUN: 18 mg/dL (ref 8–23)
CO2: 31 mmol/L (ref 22–32)
Calcium: 8.2 mg/dL — ABNORMAL LOW (ref 8.9–10.3)
Chloride: 98 mmol/L (ref 98–111)
Creatinine, Ser: 1.26 mg/dL — ABNORMAL HIGH (ref 0.44–1.00)
GFR, Estimated: 47 mL/min — ABNORMAL LOW (ref >60)
Glucose, Bld: 182 mg/dL — ABNORMAL HIGH (ref 70–99)
Potassium: 4 mmol/L (ref 3.5–5.1)
Sodium: 137 mmol/L (ref 135–145)

## 2023-01-05 LAB — GLUCOSE, CAPILLARY
Glucose-Capillary: 159 mg/dL — ABNORMAL HIGH (ref 70–99)
Glucose-Capillary: 227 mg/dL — ABNORMAL HIGH (ref 70–99)
Glucose-Capillary: 192 mg/dL — ABNORMAL HIGH (ref 70–99)
Glucose-Capillary: 190 mg/dL — ABNORMAL HIGH (ref 70–99)

## 2023-01-05 LAB — LEVETIRACETAM LEVEL: Levetiracetam Lvl: 21 ug/mL (ref 10.0–40.0)

## 2023-01-05 MED ORDER — LOSARTAN POTASSIUM 50 MG PO TABS
25.0000 mg | ORAL_TABLET | Freq: Every day | ORAL | Status: DC
  Administered 2023-01-06 – 2023-01-07 (×2): 25 mg via ORAL
  Filled 2023-01-05 (×2): qty 1

## 2023-01-05 MED ORDER — INSULIN GLARGINE-YFGN 100 UNIT/ML ~~LOC~~ SOLN
10.0000 [IU] | Freq: Every day | SUBCUTANEOUS | Status: DC
  Administered 2023-01-05 – 2023-01-11 (×7): 10 [IU] via SUBCUTANEOUS
  Filled 2023-01-05 (×7): qty 0.1

## 2023-01-05 MED ORDER — BUPRENORPHINE 7.5 MCG/HR TD PTWK
2.0000 | MEDICATED_PATCH | TRANSDERMAL | Status: DC
  Administered 2023-01-05: 2 via TRANSDERMAL
  Filled 2023-01-05: qty 2

## 2023-01-05 MED ORDER — SODIUM CHLORIDE 0.9 % IV BOLUS
250.0000 mL | Freq: Once | INTRAVENOUS | Status: AC
  Administered 2023-01-05: 250 mL via INTRAVENOUS

## 2023-01-05 NOTE — Progress Notes (Signed)
PROGRESS NOTE    Brenda Morgan  OZH:[id number] DOB: December 20, 1956 DOA: 01/03/2023 PCP: Tamsen Roers, NP   Brief Narrative: 67 year old with past medical history significant for seizure, heart failure preserved ejection fraction, diabetes type 2, hypertension, COPD, PVD, CKD, OSA not on CPAP, chronic pain, fibromyalgia on opioids, NASH, past EtOH abuse, unintentional Tylenol overdose 2012, presents accompanied by husband due to altered mental status, and unsteady gait.  Patient has fallen twice the day of admission and once the day prior to admission.  She  has also been complaining lower extremity pain.    Assessment & Plan:   Principal Problem:   Altered mental status Active Problems:   COPD (chronic obstructive pulmonary disease) (HCC)   DM II (diabetes mellitus, type II), controlled (HCC)   Hypertension associated with type 2 diabetes mellitus (HCC)   AMS (altered mental status)   Chronic pain   Restless legs syndrome   Sleep apnea, unspecified   Seizure disorder (HCC)   History of cerebral infarction   Hepatic fibrosis due to nonalcoholic fatty liver disease   Spinal cord stimulator present   Cellulitis   1-Acute Metabolic Encephalopathy:  Could be related to polypharmacy, infectious process. Neurology consulted to assess with management of medications for seizures, and further  evaluation. She appears to have more difficulty finding words and expressing herself this afternoon, she follows commands. She has had some of this episode at home since Tuesday, since she got sick.  I have asked neurology to see patient today. Do we need to repeat EEG. [name]   2-Bilateral lower extremity cellulitis worse on the right with blister Continue with  IV vancomycin LE redness improved.  Wound care consulted.   3-Seizure: I have resumed Keppra 1 g twice daily and Depakote 500 mg twice daily. Neurology consulted for further recommendation of medications. Plan to continue with  current regimen//    4-Frequent Fall: Patient with frequent fall, she will need PT OT evaluation Reports dizziness on ambulation.  Orthostatic vitals ordered  5-Acute on Chronic Diastolic HF;  Mild elevated BNP, LE edema.  Plan to transition to oral Lasix, concern with orthostatic hypotension.  Continue with oral lasix.   COPD; continue with Advair and Albuterol/   HTN;  Monitor on losartan and coreg.   Chronic Pain:  Resume Cymbalta and lyrica.  She is on buprenorphine patch. Will resume   Diabetes;  Continue with SSI.   Sleep apnea; no compliant with CPAP.   NASH Ammonia level normal.       Estimated body mass index is 33.57 kg/m as calculated from the following:   Height as of this encounter: 5\' 6"  (1.676 m).   Weight as of this encounter: 94.3 kg.   DVT prophylaxis: Heparin Code Status: Full code Family Communication:Husband at bedside.  Disposition Plan:  Status is: Inpatient Remains inpatient appropriate because: Management of cellulitis and encephalopathy    Consultants:  Neurology   Procedures:    Antimicrobials:    Subjective: She is alert, her speech is clear but she is having some difficulty finding words and expressing herself. Husband at bedside report is worse this afternoon. Episodes comes and go. She had some of this episode at home since she got sick Tuesday. .   Objective: Vitals:   01/05/23 0450 01/05/23 0725 01/05/23 0744 01/05/23 0832  BP: (!) 143/68  (!) 146/57   Pulse: 63  (!) 58   Resp: (!) 63 18 18   Temp: 97.8 F (36.6 C)  97.7 F (  36.5 C)   TempSrc: Oral  Oral   SpO2: 96%  95% 96%  Weight:      Height:        Intake/Output Summary (Last 24 hours) at 01/05/2023 1222 Last data filed at 01/04/2023 2117 Gross per 24 hour  Intake 401.06 ml  Output 1000 ml  Net -598.94 ml   Filed Weights   01/03/23 1941  Weight: 94.3 kg    Examination:  General exam: NAD Respiratory system: CTA Cardiovascular system: S 1, S 2  RRR Gastrointestinal system: BS present, soft nt. Central nervous system: Alert, follows command , Moves all 4 extremities, no drift. Face symmetric, difficulty finding words.  Extremities: Symmetric 5 x 5 power.    Data Reviewed: I have personally reviewed following labs and imaging studies  CBC: Recent Labs  Lab 01/03/23 1949 01/05/23 0235  WBC 5.2 5.4  NEUTROABS 2.6  --   HGB 10.7* 10.1*  HCT 34.3* 31.7*  MCV 92.7 91.6  PLT 132* 126*    Basic Metabolic Panel: Recent Labs  Lab 01/03/23 1949 01/04/23 0510 01/05/23 0235  NA 134* 139 137  K 3.5 3.4* 4.0  CL 98 100 98  CO2 28 29 31   GLUCOSE 267* 166* 182*  BUN 22 19 18   CREATININE 1.21* 1.02* 1.26*  CALCIUM 8.4* 8.3* 8.2*    GFR: Estimated Creatinine Clearance: 51.5 mL/min (A) (by C-G formula based on SCr of 1.26 mg/dL (H)). Liver Function Tests: Recent Labs  Lab 01/03/23 1949  AST 13*  ALT 13  ALKPHOS 79  BILITOT 0.5  PROT 6.2*  ALBUMIN 2.4*    No results for input(s): "LIPASE", "AMYLASE" in the last 168 hours. Recent Labs  Lab 01/03/23 2200  AMMONIA 26    Coagulation Profile: No results for input(s): "INR", "PROTIME" in the last 168 hours. Cardiac Enzymes: Recent Labs  Lab 01/04/23 0510  CKTOTAL 48    BNP (last 3 results) No results for input(s): "PROBNP" in the last 8760 hours. HbA1C: Recent Labs    01/04/23 0510  HGBA1C 7.2*    CBG: Recent Labs  Lab 01/04/23 1151 01/04/23 1646 01/04/23 2120 01/05/23 0744 01/05/23 1136  GLUCAP 169* 208* 232* 159* 227*    Lipid Profile: No results for input(s): "CHOL", "HDL", "LDLCALC", "TRIG", "CHOLHDL", "LDLDIRECT" in the last 72 hours. Thyroid Function Tests: Recent Labs    01/04/23 0510  TSH 3.512    Anemia Panel: Recent Labs    01/04/23 0510  VITAMINB12 504    Sepsis Labs: No results for input(s): "PROCALCITON", "LATICACIDVEN" in the last 168 hours.  Recent Results (from the past 240 hour(s))  Resp panel by RT-PCR (RSV,  Flu A&B, Covid) Anterior Nasal Swab     Status: None   Collection Time: 01/03/23  9:49 PM   Specimen: Anterior Nasal Swab  Result Value Ref Range Status   SARS Coronavirus 2 by RT PCR NEGATIVE NEGATIVE Final    Comment: (NOTE) SARS-CoV-2 target nucleic acids are NOT DETECTED.  The SARS-CoV-2 RNA is generally detectable in upper respiratory specimens during the acute phase of infection. The lowest concentration of SARS-CoV-2 viral copies this assay can detect is 138 copies/mL. A negative result does not preclude SARS-Cov-2 infection and should not be used as the sole basis for treatment or other patient management decisions. A negative result may occur with  improper specimen collection/handling, submission of specimen other than nasopharyngeal swab, presence of viral mutation(s) within the areas targeted by this assay, and inadequate number of viral  copies(<138 copies/mL). A negative result must be combined with clinical observations, patient history, and epidemiological information. The expected result is Negative.  Fact Sheet for Patients:  BloggerCourse.com  Fact Sheet for Healthcare Providers:  SeriousBroker.it  This test is no t yet approved or cleared by the [place] FDA and  has been authorized for detection and/or diagnosis of SARS-CoV-2 by FDA under an Emergency Use Authorization (EUA). This EUA will remain  in effect (meaning this test can be used) for the duration of the COVID-19 declaration under Section 564(b)(1) of the Act, 21 U.S.C.section 360bbb-3(b)(1), unless the authorization is terminated  or revoked sooner.       Influenza A by PCR NEGATIVE NEGATIVE Final   Influenza B by PCR NEGATIVE NEGATIVE Final    Comment: (NOTE) The Xpert Xpress SARS-CoV-2/FLU/RSV plus assay is intended as an aid in the diagnosis of influenza from Nasopharyngeal swab specimens and should not be used as a sole basis for  treatment. Nasal washings and aspirates are unacceptable for Xpert Xpress SARS-CoV-2/FLU/RSV testing.  Fact Sheet for Patients: BloggerCourse.com  Fact Sheet for Healthcare Providers: SeriousBroker.it  This test is not yet approved or cleared by the [place] FDA and has been authorized for detection and/or diagnosis of SARS-CoV-2 by FDA under an Emergency Use Authorization (EUA). This EUA will remain in effect (meaning this test can be used) for the duration of the COVID-19 declaration under Section 564(b)(1) of the Act, 21 U.S.C. section 360bbb-3(b)(1), unless the authorization is terminated or revoked.     Resp Syncytial Virus by PCR NEGATIVE NEGATIVE Final    Comment: (NOTE) Fact Sheet for Patients: BloggerCourse.com  Fact Sheet for Healthcare Providers: SeriousBroker.it  This test is not yet approved or cleared by the [place] FDA and has been authorized for detection and/or diagnosis of SARS-CoV-2 by FDA under an Emergency Use Authorization (EUA). This EUA will remain in effect (meaning this test can be used) for the duration of the COVID-19 declaration under Section 564(b)(1) of the Act, 21 U.S.C. section 360bbb-3(b)(1), unless the authorization is terminated or revoked.  Performed at [facility], [address], [place], [place] [address]   MRSA Next Gen by PCR, Nasal     Status: None   Collection Time: 23-Jan-2023 10:43 AM   Specimen: Nasal Mucosa; Nasal Swab  Result Value Ref Range Status   MRSA by PCR Next Gen NOT DETECTED NOT DETECTED Final    Comment: (NOTE) The GeneXpert MRSA Assay (FDA approved for NASAL specimens only), is one component of a comprehensive MRSA colonization surveillance program. It is not intended to diagnose MRSA infection nor to guide or monitor treatment for MRSA infections. Test performance is not FDA approved in patients  less than 31 years old. Performed at [facility], [address], [place], [place] [address]          Radiology Studies: EEG adult  Result Date: 23-Jan-2023 Charlsie Quest, MD     01/23/2023  4:46 PM Patient Name: Brenda Morgan MRN: [id number] Epilepsy Attending: Charlsie Quest Referring Physician/Provider: Karie Fetch, MD Date: 01/23/2023 Duration: 21.34 mins Patient history: 67yo F with ams. EEG to evaluate for seizure Level of alertness: Awake AEDs during EEG study: LEV, PGB, VPA Technical aspects: This EEG study was done with scalp electrodes positioned according to the 10-20 International system of electrode placement. Electrical activity was reviewed with band pass filter of 1-70Hz , sensitivity of 7 uV/mm, display speed of 57mm/sec with a 60Hz  notched filter  applied as appropriate. EEG data were recorded continuously and digitally stored.  Video monitoring was available and reviewed as appropriate. Description: The posterior dominant rhythm consists of 10 Hz activity of moderate voltage (25-35 uV) seen predominantly in posterior head regions, symmetric and reactive to eye opening and eye closing. Hyperventilation and photic stimulation were not performed.   IMPRESSION: This study is within normal limits. No seizures or epileptiform discharges were seen throughout the recording. A normal interictal EEG does not exclude the diagnosis of epilepsy. Charlsie Quest   ECHOCARDIOGRAM COMPLETE  Result Date: 01/04/2023    ECHOCARDIOGRAM REPORT   Patient Name:   Brenda Morgan Date of Exam: 01/04/2023 Medical Rec #:  [id number]        Height:       66.0 in Accession #:    [id number]       Weight:       208.0 lb Date of Birth:  Nov 20, 1956        BSA:          2.033 m Patient Age:    66 years         BP:           159/101 mmHg Patient Gender: F                HR:           80 bpm. Exam Location:  Inpatient Procedure: 2D Echo, Color Doppler and Cardiac Doppler Indications:    CHF-Acute  Diastolic I50.31  History:        Patient has no prior history of Echocardiogram examinations.                 CHF, AMS and Pulmonary HTN; Risk Factors:Diabetes, Current                 Smoker, Sleep Apnea and Hypertension.  Sonographer:    Aron Baba Referring Phys: [id number] DEBBY CROSLEY  Sonographer Comments: Patient is obese and no parasternal window. Image acquisition challenging due to COPD and Image acquisition challenging due to respiratory motion. IMPRESSIONS  1. Left ventricular ejection fraction, by estimation, is 60 to 65%. The left ventricle has normal function. The left ventricle has no regional wall motion abnormalities. Left ventricular diastolic parameters are indeterminate.  2. Right ventricular systolic function is normal. The right ventricular size is normal. Tricuspid regurgitation signal is inadequate for assessing PA pressure.  3. Left atrial size was mildly dilated.  4. The mitral valve is grossly normal. No evidence of mitral valve regurgitation. No evidence of mitral stenosis.  5. The aortic valve is tricuspid. There is mild calcification of the aortic valve. Aortic valve regurgitation is not visualized. Aortic valve sclerosis is present, with no evidence of aortic valve stenosis.  6. The inferior vena cava is normal in size with greater than 50% respiratory variability, suggesting right atrial pressure of 3 mmHg. FINDINGS  Left Ventricle: Left ventricular ejection fraction, by estimation, is 60 to 65%. The left ventricle has normal function. The left ventricle has no regional wall motion abnormalities. The left ventricular internal cavity size was normal in size. Suboptimal image quality limits for assessment of left ventricular hypertrophy. Left ventricular diastolic parameters are indeterminate. Right Ventricle: The right ventricular size is normal. No increase in right ventricular wall thickness. Right ventricular systolic function is normal. Tricuspid regurgitation signal is inadequate  for assessing PA pressure. Left Atrium: Left atrial size was mildly dilated. Right Atrium: Right atrial size was normal in  size. Pericardium: There is no evidence of pericardial effusion. Mitral Valve: The mitral valve is grossly normal. Mild mitral annular calcification. No evidence of mitral valve regurgitation. No evidence of mitral valve stenosis. Tricuspid Valve: The tricuspid valve is grossly normal. Tricuspid valve regurgitation is not demonstrated. No evidence of tricuspid stenosis. Aortic Valve: The aortic valve is tricuspid. There is mild calcification of the aortic valve. Aortic valve regurgitation is not visualized. Aortic valve sclerosis is present, with no evidence of aortic valve stenosis. Pulmonic Valve: The pulmonic valve was grossly normal. Pulmonic valve regurgitation is not visualized. No evidence of pulmonic stenosis. Aorta: The aortic root is normal in size and structure. Venous: The inferior vena cava is normal in size with greater than 50% respiratory variability, suggesting right atrial pressure of 3 mmHg. IAS/Shunts: The atrial septum is grossly normal.  LEFT VENTRICLE PLAX 2D LVOT diam:     1.70 cm      Diastology LV SV:         54           LV e' medial:    10.60 cm/s LV SV Index:   27           LV E/e' medial:  14.4 LVOT Area:     2.27 cm     LV e' lateral:   7.15 cm/s                             LV E/e' lateral: 21.4  LV Volumes (MOD) LV vol d, MOD A2C: 54.3 ml LV vol d, MOD A4C: 104.0 ml LV vol s, MOD A2C: 29.3 ml LV vol s, MOD A4C: 30.6 ml LV SV MOD A2C:     25.0 ml LV SV MOD A4C:     104.0 ml LV SV MOD BP:      48.5 ml RIGHT VENTRICLE RV S prime:     14.80 cm/s TAPSE (M-mode): 2.2 cm LEFT ATRIUM           Index        RIGHT ATRIUM           Index LA diam:      3.70 cm 1.82 cm/m   RA Area:     12.70 cm LA Vol (A2C): 68.7 ml 33.79 ml/m  RA Volume:   25.70 ml  12.64 ml/m LA Vol (A4C): 80.7 ml 39.69 ml/m  AORTIC VALVE LVOT Vmax:   106.00 cm/s LVOT Vmean:  70.000 cm/s LVOT VTI:     0.238 m  AORTA Ao Root diam: 3.30 cm MITRAL VALVE MV Area (PHT): 3.30 cm     SHUNTS MV Decel Time: 230 msec     Systemic VTI:  0.24 m MV E velocity: 153.00 cm/s  Systemic Diam: 1.70 cm MV A velocity: 148.00 cm/s MV E/A ratio:  1.03 Lennie Odor MD Electronically signed by Lennie Odor MD Signature Date/Time: 01/04/2023/3:01:20 PM    Final    CT Head Wo Contrast  Result Date: 01/03/2023 CLINICAL DATA:  Frequent falls with lower extremity swelling and shortness of breath. EXAM: CT HEAD WITHOUT CONTRAST TECHNIQUE: Contiguous axial images were obtained from the base of the skull through the vertex without intravenous contrast. RADIATION DOSE REDUCTION: This exam was performed according to the departmental dose-optimization program which includes automated exposure control, adjustment of the mA and/or kV according to patient size and/or use of iterative reconstruction technique. COMPARISON:  July 13, 2022 FINDINGS: Brain: There  is mild cerebral atrophy with widening of the extra-axial spaces and stable ventricular dilatation. There are areas of decreased attenuation within the white matter tracts of the supratentorial brain, consistent with microvascular disease changes. Vascular: No hyperdense vessel or unexpected calcification. Skull: Normal. Negative for fracture or focal lesion. Sinuses/Orbits: No acute finding. Other: None. IMPRESSION: 1. No acute intracranial abnormality. 2. Generalized cerebral atrophy with chronic white matter small vessel ischemic changes. Electronically Signed   By: Virgina Norfolk M.D.   On: 01/03/2023 23:03   DG Chest 2 View  Result Date: 01/03/2023 CLINICAL DATA:  Shortness of breath and weakness. EXAM: CHEST - 2 VIEW COMPARISON:  July 13, 2022 FINDINGS: The heart size and mediastinal contours are within normal limits. Both lungs are clear. Radiopaque surgical clips are seen within the right upper quadrant. Stable spinal stimulator wire positioning is noted. The visualized  skeletal structures are unremarkable. IMPRESSION: No active cardiopulmonary disease. Electronically Signed   By: Virgina Norfolk M.D.   On: 01/03/2023 20:22        Scheduled Meds:  atorvastatin  10 mg Oral Daily   carvedilol  25 mg Oral Daily   divalproex  500 mg Oral Q12H   DULoxetine  60 mg Oral Daily   famotidine  40 mg Oral QHS   furosemide  40 mg Oral Daily   heparin  5,000 Units Subcutaneous Q8H   icosapent Ethyl  2 g Oral BID   insulin aspart  0-15 Units Subcutaneous TID WC   insulin aspart  0-5 Units Subcutaneous QHS   insulin glargine-yfgn  10 Units Subcutaneous Daily   lactulose  10 g Oral Daily   levETIRAcetam  1,000 mg Oral BID   [START ON 01/06/2023] losartan  25 mg Oral Daily   mometasone-formoterol  2 puff Inhalation BID   pantoprazole  40 mg Oral Daily   pramipexole  1 mg Oral QHS   pregabalin  200 mg Oral BID   sodium chloride flush  3 mL Intravenous Q12H   Continuous Infusions:  sodium chloride     vancomycin 1,000 mg (01/05/23 0900)     LOS: 1 day    Time spent: 35 minutes.     Elmarie Shiley, MD [facility]   If 7PM-7AM, please contact night-coverage [email or web address]  01/05/2023, 12:22 PM

## 2023-01-05 NOTE — Evaluation (Signed)
Occupational Therapy Evaluation Patient Details Name: Brenda Morgan MRN: [id number] DOB: December 25, 1955 Today's Date: 01/05/2023   History of Present Illness This is a 67 year old female with PMH seizures, HFpEF (2018), DM2, HTN, COPD, CKD, PVD, OSA no CPAP, chronic pain, fibromyalgia on opioids, NASH, past EtOH abuse, unintentional tylenol OD (2012), who presents due to altered mentation, unsteady gait.  Patient fell 3 times.   Clinical Impression   Pt presents with decline in function and safety with ADLs and ADL mobility with impaired strength, balance and endurance. PTA, pt lived at home with her husband and other family members. Pt was Ind with ADLs/selfcare, simple meal prep, did not drive and used RW for mobility. Pt currently required min A with LB ADLs, set up with grooming and UB ADLs seated, Mod A sit - stand, min A SPTs. Pt would benefit from acute OT services to address impairments to maximize level of function and safety      Recommendations for follow up therapy are one component of a multi-disciplinary discharge planning process, led by the attending physician.  Recommendations may be updated based on patient status, additional functional criteria and insurance authorization.   Follow Up Recommendations  Home health OT     Assistance Recommended at Discharge Frequent or constant Supervision/Assistance  Patient can return home with the following A little help with bathing/dressing/bathroom;A little help with walking and/or transfers;Help with stairs or ramp for entrance    Functional Status Assessment  Patient has had a recent decline in their functional status and demonstrates the ability to make significant improvements in function in a reasonable and predictable amount of time.  Equipment Recommendations  None recommended by OT    Recommendations for Other Services PT consult     Precautions / Restrictions Precautions Precautions: Fall Restrictions Weight Bearing  Restrictions: No      Mobility Bed Mobility Overal bed mobility: Modified Independent                  Transfers Overall transfer level: Needs assistance Equipment used: 1 person hand held assist Transfers: Sit to/from Stand, Bed to chair/wheelchair/BSC Sit to Stand: Mod assist Stand pivot transfers: Min assist                Balance Overall balance assessment: Needs assistance Sitting-balance support: No upper extremity supported, Feet supported Sitting balance-Leahy Scale: Good     Standing balance support: Single extremity supported, During functional activity Standing balance-Leahy Scale: Poor                             ADL either performed or assessed with clinical judgement   ADL Overall ADL's : Needs assistance/impaired Eating/Feeding: Independent   Grooming: Wash/dry hands;Wash/dry face;Set up;Sitting   Upper Body Bathing: Set up;Sitting   Lower Body Bathing: Minimal assistance;Sitting/lateral leans   Upper Body Dressing : Set up;Sitting   Lower Body Dressing: Minimal assistance   Toilet Transfer: Minimal assistance;Stand-pivot Toilet Transfer Details (indicate cue type and reason): simulated bed to recliner Toileting- Clothing Manipulation and Hygiene: Minimal assistance       Functional mobility during ADLs: Minimal assistance       Vision Baseline Vision/History: 1 Wears glasses Patient Visual Report: No change from baseline       Perception     Praxis      Pertinent Vitals/Pain Pain Assessment Pain Assessment: 0-10 Pain Score: 4  Pain Location: R LE Pain Descriptors / Indicators: Aching  Hand Dominance Right   Extremity/Trunk Assessment Upper Extremity Assessment Upper Extremity Assessment: Generalized weakness   Lower Extremity Assessment Lower Extremity Assessment: Defer to PT evaluation   Cervical / Trunk Assessment Cervical / Trunk Assessment: Normal   Communication  Communication Communication: No difficulties   Cognition Arousal/Alertness: Awake/alert Behavior During Therapy: WFL for tasks assessed/performed Overall Cognitive Status: Within Functional Limits for tasks assessed                                       General Comments       Exercises     Shoulder Instructions      Home Living Family/patient expects to be discharged to:: Private residence Living Arrangements: Spouse/significant other Available Help at Discharge: Family;Available 24 hours/day Type of Home: Mobile home Home Access: Ramped entrance     Home Layout: One level     Bathroom Shower/Tub: Occupational psychologist: Handicapped height Bathroom Accessibility: Yes   Home Equipment: Conservation officer, nature (2 wheels);Rollator (4 wheels);Cane - single point;BSC/3in1;Shower seat;Grab bars - toilet;Other (comment);Adaptive equipment          Prior Functioning/Environment Prior Level of Function : Independent/Modified Independent                        OT Problem List: Decreased strength;Decreased activity tolerance;Impaired balance (sitting and/or standing);Decreased coordination;Pain;Obesity;Decreased knowledge of use of DME or AE      OT Treatment/Interventions: Self-care/ADL training;Balance training;Therapeutic exercise;DME and/or AE instruction;Neuromuscular education;Therapeutic activities;Patient/family education    OT Goals(Current goals can be found in the care plan section) Acute Rehab OT Goals Patient Stated Goal: "go home and have therapy" OT Goal Formulation: With patient Time For Goal Achievement: 01/19/23 Potential to Achieve Goals: Good ADL Goals Pt Will Perform Grooming: with min guard assist;with supervision;standing Pt Will Perform Lower Body Bathing: with min guard assist;with supervision Pt Will Perform Lower Body Dressing: with min guard assist;with supervision Pt Will Transfer to Toilet: with min guard assist;with  supervision Pt Will Perform Toileting - Clothing Manipulation and hygiene: with min guard assist;with supervision Pt Will Perform Tub/Shower Transfer: with min assist;with min guard assist;ambulating;rolling walker;shower seat  OT Frequency: Min 2X/week    Co-evaluation              AM-PAC OT "6 Clicks" Daily Activity     Outcome Measure Help from another person eating meals?: None Help from another person taking care of personal grooming?: A Little Help from another person toileting, which includes using toliet, bedpan, or urinal?: A Little Help from another person bathing (including washing, rinsing, drying)?: A Little Help from another person to put on and taking off regular upper body clothing?: A Little Help from another person to put on and taking off regular lower body clothing?: A Little 6 Click Score: 19   End of Session Equipment Utilized During Treatment: Gait belt Nurse Communication: Mobility status  Activity Tolerance: Patient tolerated treatment well Patient left: in chair;with call bell/phone within reach  OT Visit Diagnosis: Unsteadiness on feet (R26.81);Other abnormalities of gait and mobility (R26.89);Repeated falls (R29.6);Muscle weakness (generalized) (M62.81);Pain Pain - Right/Left: Right Pain - part of body: Leg                Time: 1610-9604 OT Time Calculation (min): 29 min Charges:  OT General Charges $OT Visit: 1 Visit OT Evaluation $OT Eval Moderate Complexity: 1 Mod  OT Treatments $Therapeutic Activity: 8-22 mins    Margaretmary Eddy [facility] 01/05/2023, 11:22 AM

## 2023-01-05 NOTE — Evaluation (Signed)
Physical Therapy Evaluation Patient Details Name: Brenda Morgan MRN: [id number] DOB: 08/30/1956 Today's Date: 01/05/2023  History of Present Illness  This is a 67 year old female with PMH seizures, HFpEF (2018), DM2, HTN, COPD, CKD, PVD, OSA no CPAP, chronic pain, fibromyalgia on opioids, NASH, past EtOH abuse, unintentional tylenol OD (2012), who presents due to altered mentation, unsteady gait.  Patient fell 3 times.  Clinical Impression  Pt admitted with/for seizure, presently not at baseline mentation or function, needing min to mod assist OOB and improving.  Pt currently limited functionally due to the problems listed below.  (see problems list.)  Pt will benefit from PT to maximize function and safety to be able to get home safely with available assist.        Recommendations for follow up therapy are one component of a multi-disciplinary discharge planning process, led by the attending physician.  Recommendations may be updated based on patient status, additional functional criteria and insurance authorization.  Follow Up Recommendations Home health PT (with transition to OPPT as warranted)      Assistance Recommended at Discharge Intermittent Supervision/Assistance  Patient can return home with the following  A little help with walking and/or transfers;A little help with bathing/dressing/bathroom;Assistance with cooking/housework;Assist for transportation;Help with stairs or ramp for entrance;Direct supervision/assist for medications management;Direct supervision/assist for financial management    Equipment Recommendations None recommended by PT  Recommendations for Other Services       Functional Status Assessment       Precautions / Restrictions Precautions Precautions: Fall      Mobility  Bed Mobility Overal bed mobility: Modified Independent                  Transfers Overall transfer level: Needs assistance Equipment used: 1 person hand held  assist Transfers: Sit to/from Stand, Bed to chair/wheelchair/BSC Sit to Stand: Mod assist Stand pivot transfers: Min assist         General transfer comment: used rollator to pivot bed to chair    Ambulation/Gait Ambulation/Gait assistance: Min assist Gait Distance (Feet): 150 Feet Assistive device: Rollator (4 wheels) Gait Pattern/deviations: Step-through pattern   Gait velocity interpretation: <1.31 ft/sec, indicative of household ambulator   General Gait Details: short, mildly unsteady, though controlled steps in the rollator.  Pt was slow, but able to maneuver the rollator without being out of control.  Stairs            Wheelchair Mobility    Modified Rankin (Stroke Patients Only)       Balance Overall balance assessment: Needs assistance Sitting-balance support: No upper extremity supported, Feet supported Sitting balance-Leahy Scale: Good     Standing balance support: Single extremity supported, During functional activity, Bilateral upper extremity supported Standing balance-Leahy Scale: Poor                               Pertinent Vitals/Pain Pain Assessment Pain Assessment: Faces Faces Pain Scale: No hurt Pain Intervention(s): Monitored during session    Home Living Family/patient expects to be discharged to:: Private residence Living Arrangements: Spouse/significant other Available Help at Discharge: Family;Available 24 hours/day (in a pinch, not an extended period.,) Type of Home: Mobile home Home Access: Ramped entrance       Home Layout: One level Home Equipment: Conservation officer, nature (2 wheels);Rollator (4 wheels);Cane - single point;BSC/3in1;Shower seat;Grab bars - toilet;Other (comment);Adaptive equipment      Prior Function Prior Level of Function : Independent/Modified Independent  Hand Dominance   Dominant Hand: Right    Extremity/Trunk Assessment             Cervical / Trunk  Assessment Cervical / Trunk Assessment: Normal  Communication   Communication: No difficulties  Cognition Arousal/Alertness: Awake/alert Behavior During Therapy: WFL for tasks assessed/performed Overall Cognitive Status: Within Functional Limits for tasks assessed (but not yet at baseline per husband)                                          General Comments General comments (skin integrity, edema, etc.): she may not have enough assist without son's assist to give husband some rest, but husband is devoted and will work to make her environment safe.    Exercises     Assessment/Plan    PT Assessment    PT Problem List         PT Treatment Interventions      PT Goals (Current goals can be found in the Care Plan section)  Acute Rehab PT Goals Patient Stated Goal: Improved mentation/function    Frequency Min 3X/week     Co-evaluation               AM-PAC PT "6 Clicks" Mobility  Outcome Measure Help needed turning from your back to your side while in a flat bed without using bedrails?: A Little Help needed moving from lying on your back to sitting on the side of a flat bed without using bedrails?: A Little Help needed moving to and from a bed to a chair (including a wheelchair)?: A Little Help needed standing up from a chair using your arms (e.g., wheelchair or bedside chair)?: A Lot Help needed to walk in hospital room?: A Little Help needed climbing 3-5 steps with a railing? : A Little 6 Click Score: 17    End of Session   Activity Tolerance: Patient tolerated treatment well Patient left: in bed;with call bell/phone within reach;with family/visitor present;with bed alarm set Nurse Communication: Mobility status PT Visit Diagnosis: Unsteadiness on feet (R26.81);Other abnormalities of gait and mobility (R26.89);Other symptoms and signs involving the nervous system (R29.898)    Time: 9892-1194 PT Time Calculation (min) (ACUTE ONLY): 33  min   Charges:   PT Evaluation $PT Eval Moderate Complexity: 1 Mod PT Treatments $Gait Training: 8-22 mins        01/05/2023  Ginger Carne., PT Acute Rehabilitation Services [phone]  (office)  Tessie Fass Sheila Ocasio 01/05/2023, 6:40 PM

## 2023-01-06 DIAGNOSIS — R410 Disorientation, unspecified: Secondary | ICD-10-CM

## 2023-01-06 DIAGNOSIS — G9341 Metabolic encephalopathy: Secondary | ICD-10-CM | POA: Diagnosis not present

## 2023-01-06 DIAGNOSIS — Z79899 Other long term (current) drug therapy: Secondary | ICD-10-CM | POA: Diagnosis not present

## 2023-01-06 LAB — CBC
HCT: 31.5 % — ABNORMAL LOW (ref 36.0–46.0)
Hemoglobin: 10.3 g/dL — ABNORMAL LOW (ref 12.0–15.0)
MCH: 29.4 pg (ref 26.0–34.0)
MCHC: 32.7 g/dL (ref 30.0–36.0)
MCV: 90 fL (ref 80.0–100.0)
Platelets: 133 10*3/uL — ABNORMAL LOW (ref 150–400)
RBC: 3.5 MIL/uL — ABNORMAL LOW (ref 3.87–5.11)
RDW: 13.9 % (ref 11.5–15.5)
WBC: 4.8 10*3/uL (ref 4.0–10.5)
nRBC: 0 % (ref 0.0–0.2)

## 2023-01-06 LAB — BASIC METABOLIC PANEL
Anion gap: 10 (ref 5–15)
BUN: 24 mg/dL — ABNORMAL HIGH (ref 8–23)
CO2: 30 mmol/L (ref 22–32)
Calcium: 8.3 mg/dL — ABNORMAL LOW (ref 8.9–10.3)
Chloride: 98 mmol/L (ref 98–111)
Creatinine, Ser: 1.2 mg/dL — ABNORMAL HIGH (ref 0.44–1.00)
GFR, Estimated: 50 mL/min — ABNORMAL LOW (ref >60)
Glucose, Bld: 162 mg/dL — ABNORMAL HIGH (ref 70–99)
Potassium: 3.9 mmol/L (ref 3.5–5.1)
Sodium: 138 mmol/L (ref 135–145)

## 2023-01-06 LAB — GLUCOSE, CAPILLARY
Glucose-Capillary: 129 mg/dL — ABNORMAL HIGH (ref 70–99)
Glucose-Capillary: 156 mg/dL — ABNORMAL HIGH (ref 70–99)
Glucose-Capillary: 142 mg/dL — ABNORMAL HIGH (ref 70–99)
Glucose-Capillary: 166 mg/dL — ABNORMAL HIGH (ref 70–99)

## 2023-01-06 MED ORDER — LACTULOSE 10 GM/15ML PO SOLN: 10.0000 g | Freq: Every day | ORAL | Status: DC | PRN

## 2023-01-06 MED ORDER — CARVEDILOL 12.5 MG PO TABS
12.5000 mg | ORAL_TABLET | Freq: Every day | ORAL | Status: DC
  Administered 2023-01-07 – 2023-01-08 (×2): 12.5 mg via ORAL
  Filled 2023-01-06 (×2): qty 1

## 2023-01-06 NOTE — Progress Notes (Addendum)
Neurology Progress Note   S:// Son is at the bedside. Patient is sitting in the chair in NAD.    O:// Current vital signs: BP 130/61 (BP Location: Left Arm)   Pulse (!) 51   Temp (!) 97.5 F (36.4 C) (Oral)   Resp 16   Ht 5\' 6"  (1.676 m)   Wt 94.3 kg   SpO2 100%   BMI 33.57 kg/m  Vital signs in last 24 hours: Temp:  [97.4 F (36.3 C)-98 F (36.7 C)] 97.5 F (36.4 C) (01/14 1242) Pulse Rate:  [51-70] 51 (01/14 1254) Resp:  [16-19] 16 (01/14 1242) BP: (114-161)/(61-88) 130/61 (01/14 1254) SpO2:  [98 %-100 %] 100 % (01/14 1254) FiO2 (%):  [24 %] 24 % (01/14 0848)  GENERAL: Awake, alert in NAD HEENT: - Normocephalic and atraumatic, dry mm LUNGS - Clear to auscultation bilaterally with no wheezes CV - S1S2 RRR, no m/r/g, equal pulses bilaterally. ABDOMEN - Soft, nontender, nondistended with normoactive BS Ext: bilateral lower extremities with redness and dressing on  Psych: flat affect   NEURO:  Mental Status: She is drowsy, she is oriented to self, place and city.Oriented to month, year. World backwards is "D-L-O-R-W" Language: speech is clear. No aphasia or dysarthria noted Cranial Nerves: PERRL. EOMI, visual fields full, no facial asymmetry, facial sensation intact, hearing intact, tongue/uvula/soft palate midline, normal sternocleidomastoid and trapezius muscle strength. No evidence of tongue atrophy or fibrillations Motor: 5/5 in bilateral upper extremities and 4/5 in bilateral lower extremities  Tone: is normal and bulk is normal Sensation- Intact to light touch bilaterally Coordination: FTN intact bilaterally has slight tremor  Gait- deferred  Medications  Current Facility-Administered Medications:    0.9 %  sodium chloride infusion, 250 mL, Intravenous, PRN, Crosley, Debby, MD   acetaminophen (TYLENOL) tablet 650 mg, 650 mg, Oral, Q6H PRN **OR** acetaminophen (TYLENOL) suppository 650 mg, 650 mg, Rectal, Q6H PRN, Crosley, Debby, MD   atorvastatin (LIPITOR)  tablet 10 mg, 10 mg, Oral, Daily, Regalado, Belkys A, MD, 10 mg at 01/06/23 0832   azelastine (ASTELIN) 0.1 % nasal spray 2 spray, 2 spray, Each Nare, PRN, Regalado, Belkys A, MD   buprenorphine (BUTRANS) 7.5 MCG/HR 2 patch, 2 patch, Transdermal, Weekly, Regalado, Belkys A, MD, 2 patch at 01/05/23 1602   [START ON 01/07/2023] carvedilol (COREG) tablet 12.5 mg, 12.5 mg, Oral, Daily, Regalado, Belkys A, MD   divalproex (DEPAKOTE) DR tablet 500 mg, 500 mg, Oral, Q12H, Regalado, Belkys A, MD, 500 mg at 01/06/23 0833   DULoxetine (CYMBALTA) DR capsule 60 mg, 60 mg, Oral, Daily, Regalado, Belkys A, MD, 60 mg at 01/06/23 0834   famotidine (PEPCID) tablet 40 mg, 40 mg, Oral, QHS, Crosley, Debby, MD, 40 mg at 01/05/23 2202   furosemide (LASIX) tablet 40 mg, 40 mg, Oral, Daily, Regalado, Belkys A, MD, 40 mg at 01/06/23 0832   heparin injection 5,000 Units, 5,000 Units, Subcutaneous, Q8H, Crosley, Debby, MD, 5,000 Units at 01/06/23 0559   insulin aspart (novoLOG) injection 0-15 Units, 0-15 Units, Subcutaneous, TID WC, Crosley, Debby, MD, 3 Units at 01/06/23 1251   insulin aspart (novoLOG) injection 0-5 Units, 0-5 Units, Subcutaneous, QHS, Crosley, Debby, MD, 2 Units at 01/04/23 2203   insulin glargine-yfgn (SEMGLEE) injection 10 Units, 10 Units, Subcutaneous, Daily, Regalado, Belkys A, MD, 10 Units at 01/06/23 0834   lactulose (CHRONULAC) 10 GM/15ML solution 10 g, 10 g, Oral, Daily PRN, Regalado, Belkys A, MD   levETIRAcetam (KEPPRA) tablet 1,000 mg, 1,000 mg, Oral, BID, Regalado, Belkys  A, MD, 1,000 mg at 01/06/23 0832   losartan (COZAAR) tablet 25 mg, 25 mg, Oral, Daily, Regalado, Belkys A, MD, 25 mg at 01/06/23 0832   mometasone-formoterol (DULERA) 200-5 MCG/ACT inhaler 2 puff, 2 puff, Inhalation, BID, Regalado, Belkys A, MD, 2 puff at 01/06/23 0834   ondansetron (ZOFRAN) injection 4 mg, 4 mg, Intravenous, Q6H PRN, Crosley, Debby, MD   pantoprazole (PROTONIX) EC tablet 40 mg, 40 mg, Oral, Daily, Regalado,  Belkys A, MD, 40 mg at 01/06/23 0832   pneumococcal 20-valent conjugate vaccine (PREVNAR 20) injection 0.5 mL, 0.5 mL, Intramuscular, Prior to discharge, Crosley, Debby, MD   pramipexole (MIRAPEX) tablet 1 mg, 1 mg, Oral, QHS, Regalado, Belkys A, MD, 1 mg at 01/05/23 2201   pregabalin (LYRICA) capsule 200 mg, 200 mg, Oral, BID, Regalado, Belkys A, MD, 200 mg at 01/06/23 0832   senna-docusate (Senokot-S) tablet 1 tablet, 1 tablet, Oral, QHS PRN, Crosley, Debby, MD   sodium chloride flush (NS) 0.9 % injection 3 mL, 3 mL, Intravenous, Q12H, Crosley, Debby, MD, 3 mL at 01/06/23 0835   sodium chloride flush (NS) 0.9 % injection 3 mL, 3 mL, Intravenous, PRN, Crosley, Debby, MD   vancomycin (VANCOCIN) IVPB 1000 mg/200 mL premix, 1,000 mg, Intravenous, Q24H, Doristine Counter, RPH, Last Rate: 200 mL/hr at 01/06/23 0839, 1,000 mg at 01/06/23 0839 Labs CBC    Component Value Date/Time   WBC 4.8 01/06/2023 0302   RBC 3.50 (L) 01/06/2023 0302   HGB 10.3 (L) 01/06/2023 0302   HCT 31.5 (L) 01/06/2023 0302   PLT 133 (L) 01/06/2023 0302   MCV 90.0 01/06/2023 0302   MCH 29.4 01/06/2023 0302   MCHC 32.7 01/06/2023 0302   RDW 13.9 01/06/2023 0302   LYMPHSABS 1.6 01/03/2023 1949   MONOABS 0.8 01/03/2023 1949   EOSABS 0.1 01/03/2023 1949   BASOSABS 0.0 01/03/2023 1949    CMP     Component Value Date/Time   NA 138 01/06/2023 0302   K 3.9 01/06/2023 0302   CL 98 01/06/2023 0302   CO2 30 01/06/2023 0302   GLUCOSE 162 (H) 01/06/2023 0302   BUN 24 (H) 01/06/2023 0302   CREATININE 1.20 (H) 01/06/2023 0302   CALCIUM 8.3 (L) 01/06/2023 0302   PROT 6.2 (L) 01/03/2023 1949   ALBUMIN 2.4 (L) 01/03/2023 1949   AST 13 (L) 01/03/2023 1949   ALT 13 01/03/2023 1949   ALKPHOS 79 01/03/2023 1949   BILITOT 0.5 01/03/2023 1949   GFRNONAA 50 (L) 01/06/2023 0302   GFRAA >60 09/23/2020 1328    glycosylated hemoglobin  Lipid Panel  No results found for: "CHOL", "TRIG", "HDL", "CHOLHDL", "VLDL", "LDLCALC",  "LDLDIRECT"   Imaging I have reviewed images in epic and the results pertinent to this consultation are:  CT-scan of the brain 1. No acute intracranial abnormality. 2. Generalized cerebral atrophy with chronic white matter small vessel ischemic changes.  Assessment:  67 y.o. female with a PMHx of generalized seizures, chronic pain, COPD, non-alcoholic fatty liver disease, HLD, HTN, OSA, GERD, who presents to ED due to altered mental status, leg weakness. Routine EEG was normal on 1/12.   Acute metabolic Encephalopathy due to acute infection, and delirium superimposed on mild dementia.  Recommendations: - Delirium precautions. - try to minimize deliriogenic medications as much as possible benzodiazepines, anticholinergics, diphenhydramine, antihistamines, narcotics, Ambien/Lunesta/Sonata etc. - environmental support for delirium: Lights on during the day, patient up and out of bed as much as is feasible, OT/PT, quiet dimly lit room at  night, reorient patient often, provide hearing aides and glasses if patient uses them routinely, minimize sleep disruptions as much as possible overnight.  As much as possible, reorient patient, and have them engage patient in activities, e.g. playing cards. TV should be off or on neutral background music unless patient engaged and watching. Try to keep interactions with the patient calm and quiet.    - continue to treat cellulitis  - Continue home doses of Keppra 1000mg  BID and Depakote of 500mg  BID   Beulah Gandy ACNPC-AG, DNP   [facility]  I have seen the patient reviewed the above note.  She appears to be doing significantly better than when I saw her 2 days ago.  She appears to be having some waxing/waning as expected with delirium.  At the current time, I do not have high suspicion for other etiology, but if there continue to be concerns with spells of worsening could consider other options such as continuous EEG later in the week.  If there  are continued concerns, please reach out neurology can be available for questions as needed.  Roland Rack, MD [facility] [phone]  If 7pm- 7am, please page neurology on call as listed in [place].

## 2023-01-06 NOTE — Progress Notes (Signed)
PROGRESS NOTE    Brenda Morgan  JSH:[id number] DOB: July 19, 1956 DOA: 01/03/2023 PCP: Jennings Books, NP   Brief Narrative: 67 year old with past medical history significant for seizure, heart failure preserved ejection fraction, diabetes type 2, hypertension, COPD, PVD, CKD, OSA not on CPAP, chronic pain, fibromyalgia on opioids, NASH, past EtOH abuse, unintentional Tylenol overdose 2012, presents accompanied by husband due to altered mental status, and unsteady gait.  Patient has fallen twice the day of admission and once the day prior to admission.  She  has also been complaining lower extremity pain.    Assessment & Plan:   Principal Problem:   Altered mental status Active Problems:   COPD (chronic obstructive pulmonary disease) (HCC)   DM II (diabetes mellitus, type II), controlled ([facility])   Hypertension associated with type 2 diabetes mellitus (HCC)   AMS (altered mental status)   Chronic pain   Restless legs syndrome   Sleep apnea, unspecified   Seizure disorder (HCC)   History of cerebral infarction   Hepatic fibrosis due to nonalcoholic fatty liver disease   Spinal cord stimulator present   Cellulitis   1-Acute Metabolic Encephalopathy:  -Could be related to polypharmacy, infectious process, hypoxia.  -Neurology consulted to assess with management of medications for seizures, and further  evaluation. She appears to have more difficulty finding words and expressing herself this afternoon, she follows commands. She has had some of this episode at home since Tuesday, since she got sick.  -I have asked neurology to see patient  Do we need to repeat EEG. .  She had more difficulty finding words yesterday. Appears stable today. ABG with hypoxia, placed on 2 L oxygen.   2-Bilateral lower extremity cellulitis worse on the right with blister Continue with  IV vancomycin LE redness improved.  Wound care consulted.   3-Seizure: I have resumed Keppra 1 g twice daily  and Depakote 500 mg twice daily. Neurology consulted for further recommendation of medications. Plan to continue with current regimen//    4-Frequent Fall: Patient with frequent fall, she will need PT OT evaluation Reports dizziness on ambulation.  Orthostatic vitals ordered PT recommend HH Will reduce coreg.   5-Acute on Chronic Diastolic HF;  Mild elevated BNP, LE edema.  Continue with oral lasix.   COPD; continue with Advair and Albuterol/   HTN;  Monitor on losartan and coreg.   Chronic Pain:  Resume Cymbalta and lyrica.  She is on buprenorphine patch. Will resume   Diabetes;  Continue with SSI.   Sleep apnea; no compliant with CPAP.   NASH Ammonia level normal.       Estimated body mass index is 33.57 kg/m as calculated from the following:   Height as of this encounter: 5\' 6"  (1.676 m).   Weight as of this encounter: 94.3 kg.   DVT prophylaxis: Heparin Code Status: Full code Family Communication:Husband at bedside 1/15  Disposition Plan:  Status is: Inpatient Remains inpatient appropriate because: Management of cellulitis and encephalopathy    Consultants:  Neurology   Procedures:    Antimicrobials:    Subjective: She is alert, she was able to tell me she was in the hospital,having less difficulty finding words.  She denies pain.   Objective: Vitals:   01/05/23 1948 01/06/23 0326 01/06/23 0748 01/06/23 0848  BP: 133/70 130/70 (!) 161/72   Pulse: 61 70 61   Resp: 19 17 16    Temp: 97.6 F (36.4 C) 98 F (36.7 C) (!) 97.4 F (36.3 C)  TempSrc: Oral Oral Oral   SpO2: 100% 100% 100% 100%  Weight:      Height:        Intake/Output Summary (Last 24 hours) at 01/06/2023 1223 Last data filed at 01/05/2023 1600 Gross per 24 hour  Intake 238.34 ml  Output --  Net 238.34 ml    Filed Weights   01/03/23 1941  Weight: 94.3 kg    Examination:  General exam: NAD Respiratory system: CTA Cardiovascular system: S 1, S 2  RRRR Gastrointestinal system: BS present, soft, nt Central nervous system: Alert, follows command , Moves all 4 extremities, no drift. Face symmetric, difficulty finding words improved.  Extremities: Symmetric 5 x 5 power.    Data Reviewed: I have personally reviewed following labs and imaging studies  CBC: Recent Labs  Lab 01/03/23 1949 01/05/23 0235 01/06/23 0302  WBC 5.2 5.4 4.8  NEUTROABS 2.6  --   --   HGB 10.7* 10.1* 10.3*  HCT 34.3* 31.7* 31.5*  MCV 92.7 91.6 90.0  PLT 132* 126* 133*    Basic Metabolic Panel: Recent Labs  Lab 01/03/23 1949 01/04/23 0510 01/05/23 0235 01/06/23 0302  NA 134* 139 137 138  K 3.5 3.4* 4.0 3.9  CL 98 100 98 98  CO2 28 29 31 30   GLUCOSE 267* 166* 182* 162*  BUN 22 19 18  24*  CREATININE 1.21* 1.02* 1.26* 1.20*  CALCIUM 8.4* 8.3* 8.2* 8.3*    GFR: Estimated Creatinine Clearance: 54.1 mL/min (A) (by C-G formula based on SCr of 1.2 mg/dL (H)). Liver Function Tests: Recent Labs  Lab 01/03/23 1949  AST 13*  ALT 13  ALKPHOS 79  BILITOT 0.5  PROT 6.2*  ALBUMIN 2.4*    No results for input(s): "LIPASE", "AMYLASE" in the last 168 hours. Recent Labs  Lab 01/03/23 2200  AMMONIA 26    Coagulation Profile: No results for input(s): "INR", "PROTIME" in the last 168 hours. Cardiac Enzymes: Recent Labs  Lab 01/04/23 0510  CKTOTAL 48    BNP (last 3 results) No results for input(s): "PROBNP" in the last 8760 hours. HbA1C: Recent Labs    01/04/23 0510  HGBA1C 7.2*    CBG: Recent Labs  Lab 01/05/23 1136 01/05/23 1705 01/05/23 2048 01/06/23 0748 01/06/23 1204  GLUCAP 227* 192* 190* 129* 156*    Lipid Profile: No results for input(s): "CHOL", "HDL", "LDLCALC", "TRIG", "CHOLHDL", "LDLDIRECT" in the last 72 hours. Thyroid Function Tests: Recent Labs    01/04/23 0510  TSH 3.512    Anemia Panel: Recent Labs    01/04/23 0510  VITAMINB12 504    Sepsis Labs: No results for input(s): "PROCALCITON",  "LATICACIDVEN" in the last 168 hours.  Recent Results (from the past 240 hour(s))  Resp panel by RT-PCR (RSV, Flu A&B, Covid) Anterior Nasal Swab     Status: None   Collection Time: 01/03/23  9:49 PM   Specimen: Anterior Nasal Swab  Result Value Ref Range Status   SARS Coronavirus 2 by RT PCR NEGATIVE NEGATIVE Final    Comment: (NOTE) SARS-CoV-2 target nucleic acids are NOT DETECTED.  The SARS-CoV-2 RNA is generally detectable in upper respiratory specimens during the acute phase of infection. The lowest concentration of SARS-CoV-2 viral copies this assay can detect is 138 copies/mL. A negative result does not preclude SARS-Cov-2 infection and should not be used as the sole basis for treatment or other patient management decisions. A negative result may occur with  improper specimen collection/handling, submission of specimen other than nasopharyngeal  swab, presence of viral mutation(s) within the areas targeted by this assay, and inadequate number of viral copies(<138 copies/mL). A negative result must be combined with clinical observations, patient history, and epidemiological information. The expected result is Negative.  Fact Sheet for Patients:  BloggerCourse.com  Fact Sheet for Healthcare Providers:  SeriousBroker.it  This test is no t yet approved or cleared by the [place] FDA and  has been authorized for detection and/or diagnosis of SARS-CoV-2 by FDA under an Emergency Use Authorization (EUA). This EUA will remain  in effect (meaning this test can be used) for the duration of the COVID-19 declaration under Section 564(b)(1) of the Act, 21 U.S.C.section 360bbb-3(b)(1), unless the authorization is terminated  or revoked sooner.       Influenza A by PCR NEGATIVE NEGATIVE Final   Influenza B by PCR NEGATIVE NEGATIVE Final    Comment: (NOTE) The Xpert Xpress SARS-CoV-2/FLU/RSV plus assay is intended as an aid in  the diagnosis of influenza from Nasopharyngeal swab specimens and should not be used as a sole basis for treatment. Nasal washings and aspirates are unacceptable for Xpert Xpress SARS-CoV-2/FLU/RSV testing.  Fact Sheet for Patients: BloggerCourse.com  Fact Sheet for Healthcare Providers: SeriousBroker.it  This test is not yet approved or cleared by the [place] FDA and has been authorized for detection and/or diagnosis of SARS-CoV-2 by FDA under an Emergency Use Authorization (EUA). This EUA will remain in effect (meaning this test can be used) for the duration of the COVID-19 declaration under Section 564(b)(1) of the Act, 21 U.S.C. section 360bbb-3(b)(1), unless the authorization is terminated or revoked.     Resp Syncytial Virus by PCR NEGATIVE NEGATIVE Final    Comment: (NOTE) Fact Sheet for Patients: BloggerCourse.com  Fact Sheet for Healthcare Providers: SeriousBroker.it  This test is not yet approved or cleared by the [place] FDA and has been authorized for detection and/or diagnosis of SARS-CoV-2 by FDA under an Emergency Use Authorization (EUA). This EUA will remain in effect (meaning this test can be used) for the duration of the COVID-19 declaration under Section 564(b)(1) of the Act, 21 U.S.C. section 360bbb-3(b)(1), unless the authorization is terminated or revoked.  Performed at [facility], [address], [place], [place] [address]   MRSA Next Gen by PCR, Nasal     Status: None   Collection Time: 01/05/23 10:43 AM   Specimen: Nasal Mucosa; Nasal Swab  Result Value Ref Range Status   MRSA by PCR Next Gen NOT DETECTED NOT DETECTED Final    Comment: (NOTE) The GeneXpert MRSA Assay (FDA approved for NASAL specimens only), is one component of a comprehensive MRSA colonization surveillance program. It is not intended to diagnose MRSA infection  nor to guide or monitor treatment for MRSA infections. Test performance is not FDA approved in patients less than 46 years old. Performed at [facility], [address], [place], [place] [address]   Urine Culture     Status: None   Collection Time: 01/05/2023 11:58 AM   Specimen: Urine, Clean Catch  Result Value Ref Range Status   Specimen Description URINE, CLEAN CATCH  Final   Special Requests NONE  Final   Culture   Final    NO GROWTH Performed at [facility], [address], [place], [place] [address]    Report Status 01/05/2023 FINAL  Final         Radiology Studies: EEG adult  Result Date: 01-05-2023 Charlsie Quest, MD  01/04/2023  4:46 PM Patient Name: Jhanae Jaskowiak MRN: [id number] Epilepsy Attending: Lora Havens Referring Physician/Provider: Rolanda Lundborg, MD Date: 01/04/2023 Duration: 21.34 mins Patient history: 67yo F with ams. EEG to evaluate for seizure Level of alertness: Awake AEDs during EEG study: LEV, PGB, VPA Technical aspects: This EEG study was done with scalp electrodes positioned according to the 10-20 International system of electrode placement. Electrical activity was reviewed with band pass filter of 1-70Hz , sensitivity of 7 uV/mm, display speed of 61mm/sec with a 60Hz  notched filter applied as appropriate. EEG data were recorded continuously and digitally stored.  Video monitoring was available and reviewed as appropriate. Description: The posterior dominant rhythm consists of 10 Hz activity of moderate voltage (25-35 uV) seen predominantly in posterior head regions, symmetric and reactive to eye opening and eye closing. Hyperventilation and photic stimulation were not performed.   IMPRESSION: This study is within normal limits. No seizures or epileptiform discharges were seen throughout the recording. A normal interictal EEG does not exclude the diagnosis of epilepsy. Lora Havens   ECHOCARDIOGRAM COMPLETE  Result Date:  01/04/2023    ECHOCARDIOGRAM REPORT   Patient Name:   JAHDAI PADOVANO Date of Exam: 01/04/2023 Medical Rec #:  [id number]        Height:       66.0 in Accession #:    [id number]       Weight:       208.0 lb Date of Birth:  April 17, 1956        BSA:          2.033 m Patient Age:    74 years         BP:           159/101 mmHg Patient Gender: F                HR:           80 bpm. Exam Location:  Inpatient Procedure: 2D Echo, Color Doppler and Cardiac Doppler Indications:    CHF-Acute Diastolic A63.01  History:        Patient has no prior history of Echocardiogram examinations.                 CHF, AMS and Pulmonary HTN; Risk Factors:Diabetes, Current                 Smoker, Sleep Apnea and Hypertension.  Sonographer:    Greer Pickerel Referring Phys: [id number] DEBBY CROSLEY  Sonographer Comments: Patient is obese and no parasternal window. Image acquisition challenging due to COPD and Image acquisition challenging due to respiratory motion. IMPRESSIONS  1. Left ventricular ejection fraction, by estimation, is 60 to 65%. The left ventricle has normal function. The left ventricle has no regional wall motion abnormalities. Left ventricular diastolic parameters are indeterminate.  2. Right ventricular systolic function is normal. The right ventricular size is normal. Tricuspid regurgitation signal is inadequate for assessing PA pressure.  3. Left atrial size was mildly dilated.  4. The mitral valve is grossly normal. No evidence of mitral valve regurgitation. No evidence of mitral stenosis.  5. The aortic valve is tricuspid. There is mild calcification of the aortic valve. Aortic valve regurgitation is not visualized. Aortic valve sclerosis is present, with no evidence of aortic valve stenosis.  6. The inferior vena cava is normal in size with greater than 50% respiratory variability, suggesting right atrial pressure of 3 mmHg. FINDINGS  Left Ventricle: Left ventricular ejection fraction, by estimation, is 60  to 65%. The left  ventricle has normal function. The left ventricle has no regional wall motion abnormalities. The left ventricular internal cavity size was normal in size. Suboptimal image quality limits for assessment of left ventricular hypertrophy. Left ventricular diastolic parameters are indeterminate. Right Ventricle: The right ventricular size is normal. No increase in right ventricular wall thickness. Right ventricular systolic function is normal. Tricuspid regurgitation signal is inadequate for assessing PA pressure. Left Atrium: Left atrial size was mildly dilated. Right Atrium: Right atrial size was normal in size. Pericardium: There is no evidence of pericardial effusion. Mitral Valve: The mitral valve is grossly normal. Mild mitral annular calcification. No evidence of mitral valve regurgitation. No evidence of mitral valve stenosis. Tricuspid Valve: The tricuspid valve is grossly normal. Tricuspid valve regurgitation is not demonstrated. No evidence of tricuspid stenosis. Aortic Valve: The aortic valve is tricuspid. There is mild calcification of the aortic valve. Aortic valve regurgitation is not visualized. Aortic valve sclerosis is present, with no evidence of aortic valve stenosis. Pulmonic Valve: The pulmonic valve was grossly normal. Pulmonic valve regurgitation is not visualized. No evidence of pulmonic stenosis. Aorta: The aortic root is normal in size and structure. Venous: The inferior vena cava is normal in size with greater than 50% respiratory variability, suggesting right atrial pressure of 3 mmHg. IAS/Shunts: The atrial septum is grossly normal.  LEFT VENTRICLE PLAX 2D LVOT diam:     1.70 cm      Diastology LV SV:         54           LV e' medial:    10.60 cm/s LV SV Index:   27           LV E/e' medial:  14.4 LVOT Area:     2.27 cm     LV e' lateral:   7.15 cm/s                             LV E/e' lateral: 21.4  LV Volumes (MOD) LV vol d, MOD A2C: 54.3 ml LV vol d, MOD A4C: 104.0 ml LV vol s, MOD A2C:  29.3 ml LV vol s, MOD A4C: 30.6 ml LV SV MOD A2C:     25.0 ml LV SV MOD A4C:     104.0 ml LV SV MOD BP:      48.5 ml RIGHT VENTRICLE RV S prime:     14.80 cm/s TAPSE (M-mode): 2.2 cm LEFT ATRIUM           Index        RIGHT ATRIUM           Index LA diam:      3.70 cm 1.82 cm/m   RA Area:     12.70 cm LA Vol (A2C): 68.7 ml 33.79 ml/m  RA Volume:   25.70 ml  12.64 ml/m LA Vol (A4C): 80.7 ml 39.69 ml/m  AORTIC VALVE LVOT Vmax:   106.00 cm/s LVOT Vmean:  70.000 cm/s LVOT VTI:    0.238 m  AORTA Ao Root diam: 3.30 cm MITRAL VALVE MV Area (PHT): 3.30 cm     SHUNTS MV Decel Time: 230 msec     Systemic VTI:  0.24 m MV E velocity: 153.00 cm/s  Systemic Diam: 1.70 cm MV A velocity: 148.00 cm/s MV E/A ratio:  1.03 Lennie Odor MD Electronically signed by Lennie Odor MD Signature Date/Time: 01/04/2023/3:01:20 PM  Final         Scheduled Meds:  atorvastatin  10 mg Oral Daily   buprenorphine  2 patch Transdermal Weekly   [START ON 01/07/2023] carvedilol  12.5 mg Oral Daily   divalproex  500 mg Oral Q12H   DULoxetine  60 mg Oral Daily   famotidine  40 mg Oral QHS   furosemide  40 mg Oral Daily   heparin  5,000 Units Subcutaneous Q8H   insulin aspart  0-15 Units Subcutaneous TID WC   insulin aspart  0-5 Units Subcutaneous QHS   insulin glargine-yfgn  10 Units Subcutaneous Daily   levETIRAcetam  1,000 mg Oral BID   losartan  25 mg Oral Daily   mometasone-formoterol  2 puff Inhalation BID   pantoprazole  40 mg Oral Daily   pramipexole  1 mg Oral QHS   pregabalin  200 mg Oral BID   sodium chloride flush  3 mL Intravenous Q12H   Continuous Infusions:  sodium chloride     vancomycin 1,000 mg (01/06/23 0839)     LOS: 2 days    Time spent: 35 minutes.     Alba Cory, MD [facility]   If 7PM-7AM, please contact night-coverage [email or web address]  01/06/2023, 12:23 PM

## 2023-01-06 NOTE — Progress Notes (Signed)
Mobility Specialist Progress Note:   01/06/23 0930  Mobility  Activity Transferred from bed to chair  Level of Assistance Minimal assist, patient does 75% or more  Assistive Device Front wheel walker  Distance Ambulated (ft) 5 ft  Activity Response Tolerated fair  Mobility Referral Yes  $Mobility charge 1 Mobility   Pt eager for mobility however limited by lightheadedness upon standing. Pt states this happens in the morning when she wakes up. Ambulation deferred at this time, pt agreeable to transfer to chair. Required minA throughout transfer. Pt left with all needs met.   Nelta Numbers Mobility Specialist Please contact via SecureChat or  Rehab office at [phone]

## 2023-01-07 ENCOUNTER — Ambulatory Visit: Payer: BC Managed Care – PPO | Admitting: Neurology

## 2023-01-07 ENCOUNTER — Encounter: Payer: Self-pay | Admitting: Neurology

## 2023-01-07 DIAGNOSIS — R531 Weakness: Secondary | ICD-10-CM | POA: Diagnosis not present

## 2023-01-07 LAB — GLUCOSE, CAPILLARY
Glucose-Capillary: 172 mg/dL — ABNORMAL HIGH (ref 70–99)
Glucose-Capillary: 128 mg/dL — ABNORMAL HIGH (ref 70–99)
Glucose-Capillary: 121 mg/dL — ABNORMAL HIGH (ref 70–99)
Glucose-Capillary: 102 mg/dL — ABNORMAL HIGH (ref 70–99)

## 2023-01-07 LAB — BASIC METABOLIC PANEL
Anion gap: 11 (ref 5–15)
BUN: 31 mg/dL — ABNORMAL HIGH (ref 8–23)
CO2: 30 mmol/L (ref 22–32)
Calcium: 7.9 mg/dL — ABNORMAL LOW (ref 8.9–10.3)
Chloride: 96 mmol/L — ABNORMAL LOW (ref 98–111)
Creatinine, Ser: 1.34 mg/dL — ABNORMAL HIGH (ref 0.44–1.00)
GFR, Estimated: 44 mL/min — ABNORMAL LOW (ref >60)
Glucose, Bld: 220 mg/dL — ABNORMAL HIGH (ref 70–99)
Potassium: 4.3 mmol/L (ref 3.5–5.1)
Sodium: 137 mmol/L (ref 135–145)

## 2023-01-07 LAB — CBC
HCT: 29.8 % — ABNORMAL LOW (ref 36.0–46.0)
Hemoglobin: 9.6 g/dL — ABNORMAL LOW (ref 12.0–15.0)
MCH: 29.5 pg (ref 26.0–34.0)
MCHC: 32.2 g/dL (ref 30.0–36.0)
MCV: 91.7 fL (ref 80.0–100.0)
Platelets: 119 10*3/uL — ABNORMAL LOW (ref 150–400)
RBC: 3.25 MIL/uL — ABNORMAL LOW (ref 3.87–5.11)
RDW: 14 % (ref 11.5–15.5)
WBC: 5.3 10*3/uL (ref 4.0–10.5)
nRBC: 0 % (ref 0.0–0.2)

## 2023-01-07 MED ORDER — ORAL CARE MOUTH RINSE: 15.0000 mL | OROMUCOSAL | Status: DC | PRN

## 2023-01-07 MED ORDER — SODIUM CHLORIDE 0.9 % IV SOLN: INTRAVENOUS | Status: DC

## 2023-01-07 MED ORDER — SODIUM CHLORIDE 0.9 % IV SOLN: INTRAVENOUS | Status: AC

## 2023-01-07 NOTE — Progress Notes (Signed)
PROGRESS NOTE    Brenda Morgan  [id number] DOB: 04-24-1956 DOA: 01/03/2023 PCP: Jennings Books, NP   Brief Narrative: 67 year old with past medical history significant for seizure, heart failure preserved ejection fraction, diabetes type 2, hypertension, COPD, PVD, CKD, OSA not on CPAP, chronic pain, fibromyalgia on opioids, NASH, past EtOH abuse, unintentional Tylenol overdose 2012, presents accompanied by husband due to altered mental status, and unsteady gait.  Patient has fallen twice the day of admission and once the day prior to admission.  She  has also been complaining lower extremity pain.    Assessment & Plan:   Principal Problem:   Altered mental status Active Problems:   COPD (chronic obstructive pulmonary disease) (HCC)   DM II (diabetes mellitus, type II), controlled ([place])   Hypertension associated with type 2 diabetes mellitus (HCC)   AMS (altered mental status)   Chronic pain   Restless legs syndrome   Sleep apnea, unspecified   Seizure disorder (HCC)   History of cerebral infarction   Hepatic fibrosis due to nonalcoholic fatty liver disease   Spinal cord stimulator present   Cellulitis   1-Acute Metabolic Encephalopathy:  -Could be related to polypharmacy, infectious process, hypoxia.  -Neurology consulted to assess with management of medications for seizures, and further  evaluation. -She had more difficulty finding words 1/13. ABG with hypoxia, placed on 2 L oxygen.  -she is less confuse, able to answer questions.  -Appreciate Neurology evaluation, patient with Delirium from infection, hypoxia.   2-Bilateral lower extremity cellulitis worse on the right with blister Continue with  IV vancomycin LE redness improved.  Wound care consulted. Local care.   3-Seizure: I have resumed Keppra 1 g twice daily and Depakote 500 mg twice daily. Neurology consulted for further recommendation of medications. Plan to continue with current regimen//     4-Frequent Fall: Patient with frequent fall, she will need PT OT evaluation Reports dizziness on ambulation.  She report dizziness on standing, BP drop only 12 pound. Will give her fluids.  PT recommend HH Reduce coreg.   5-Acute on Chronic Diastolic HF;  Mild elevated BNP, LE edema.  Hold lasix due dizziness  COPD; continue with Advair and Albuterol/   HTN;  Monitor on losartan and coreg.   Chronic Pain:  Resume Cymbalta and lyrica.  Continue buprenorphine patch.   Diabetes;  Continue with SSI.   Sleep apnea; no compliant with CPAP.   NASH Ammonia level normal.       Estimated body mass index is 34.66 kg/m as calculated from the following:   Height as of this encounter: 5\' 6"  (1.676 m).   Weight as of this encounter: 97.4 kg.   DVT prophylaxis: Heparin Code Status: Full code Family Communication:Husband at bedside 1/14 son 1/15 Disposition Plan:  Status is: Inpatient Remains inpatient appropriate because: Management of cellulitis and encephalopathy    Consultants:  Neurology   Procedures:    Antimicrobials:    Subjective: She seems less confuse, answer questions appropriately.    Objective: Vitals:   01/06/23 2010 01/06/23 2017 01/07/23 0542 01/07/23 0834  BP:  123/62 (!) 113/48 135/61  Pulse:  86 66 (!) 59  Resp:  18 16 16   Temp:  97.6 F (36.4 C) 97.7 F (36.5 C) 97.6 F (36.4 C)  TempSrc:  Oral Oral Oral  SpO2: 100% 100% 100% 100%  Weight:   97.4 kg   Height:        Intake/Output Summary (Last 24 hours) at 01/07/2023 1226 Last  data filed at 01/07/2023 0958 Gross per 24 hour  Intake 683 ml  Output 650 ml  Net 33 ml    Filed Weights   01/03/23 1941 01/07/23 0542  Weight: 94.3 kg 97.4 kg    Examination:  General exam: NAD Respiratory system: CTA Cardiovascular system: S 1, S 2 RRR Gastrointestinal system: BS present, soft,nt Central nervous system: alert, follows command Extremities: no edema, redness LE  improving    Data Reviewed: I have personally reviewed following labs and imaging studies  CBC: Recent Labs  Lab 01/03/23 1949 01/05/23 0235 01/06/23 0302 01/07/23 0443  WBC 5.2 5.4 4.8 5.3  NEUTROABS 2.6  --   --   --   HGB 10.7* 10.1* 10.3* 9.6*  HCT 34.3* 31.7* 31.5* 29.8*  MCV 92.7 91.6 90.0 91.7  PLT 132* 126* 133* 119*    Basic Metabolic Panel: Recent Labs  Lab 01/03/23 1949 01/04/23 0510 01/05/23 0235 01/06/23 0302 01/07/23 0443  NA 134* 139 137 138 137  K 3.5 3.4* 4.0 3.9 4.3  CL 98 100 98 98 96*  CO2 28 29 31 30 30   GLUCOSE 267* 166* 182* 162* 220*  BUN 22 19 18  24* 31*  CREATININE 1.21* 1.02* 1.26* 1.20* 1.34*  CALCIUM 8.4* 8.3* 8.2* 8.3* 7.9*    GFR: Estimated Creatinine Clearance: 49.2 mL/min (A) (by C-G formula based on SCr of 1.34 mg/dL (H)). Liver Function Tests: Recent Labs  Lab 01/03/23 1949  AST 13*  ALT 13  ALKPHOS 79  BILITOT 0.5  PROT 6.2*  ALBUMIN 2.4*    No results for input(s): "LIPASE", "AMYLASE" in the last 168 hours. Recent Labs  Lab 01/03/23 2200  AMMONIA 26    Coagulation Profile: No results for input(s): "INR", "PROTIME" in the last 168 hours. Cardiac Enzymes: Recent Labs  Lab 01/04/23 0510  CKTOTAL 48    BNP (last 3 results) No results for input(s): "PROBNP" in the last 8760 hours. HbA1C: No results for input(s): "HGBA1C" in the last 72 hours.  CBG: Recent Labs  Lab 01/06/23 1204 01/06/23 1656 01/06/23 2232 01/07/23 0831 01/07/23 1135  GLUCAP 156* 142* 166* 172* 128*    Lipid Profile: No results for input(s): "CHOL", "HDL", "LDLCALC", "TRIG", "CHOLHDL", "LDLDIRECT" in the last 72 hours. Thyroid Function Tests: No results for input(s): "TSH", "T4TOTAL", "FREET4", "T3FREE", "THYROIDAB" in the last 72 hours.  Anemia Panel: No results for input(s): "VITAMINB12", "FOLATE", "FERRITIN", "TIBC", "IRON", "RETICCTPCT" in the last 72 hours.  Sepsis Labs: No results for input(s): "PROCALCITON",  "LATICACIDVEN" in the last 168 hours.  Recent Results (from the past 240 hour(s))  Resp panel by RT-PCR (RSV, Flu A&B, Covid) Anterior Nasal Swab     Status: None   Collection Time: 01/03/23  9:49 PM   Specimen: Anterior Nasal Swab  Result Value Ref Range Status   SARS Coronavirus 2 by RT PCR NEGATIVE NEGATIVE Final    Comment: (NOTE) SARS-CoV-2 target nucleic acids are NOT DETECTED.  The SARS-CoV-2 RNA is generally detectable in upper respiratory specimens during the acute phase of infection. The lowest concentration of SARS-CoV-2 viral copies this assay can detect is 138 copies/mL. A negative result does not preclude SARS-Cov-2 infection and should not be used as the sole basis for treatment or other patient management decisions. A negative result may occur with  improper specimen collection/handling, submission of specimen other than nasopharyngeal swab, presence of viral mutation(s) within the areas targeted by this assay, and inadequate number of viral copies(<138 copies/mL). A negative result  must be combined with clinical observations, patient history, and epidemiological information. The expected result is Negative.  Fact Sheet for Patients:  EntrepreneurPulse.com.au  Fact Sheet for Healthcare Providers:  IncredibleEmployment.be  This test is no t yet approved or cleared by the [place] FDA and  has been authorized for detection and/or diagnosis of SARS-CoV-2 by FDA under an Emergency Use Authorization (EUA). This EUA will remain  in effect (meaning this test can be used) for the duration of the COVID-19 declaration under Section 564(b)(1) of the Act, 21 U.S.C.section 360bbb-3(b)(1), unless the authorization is terminated  or revoked sooner.       Influenza A by PCR NEGATIVE NEGATIVE Final   Influenza B by PCR NEGATIVE NEGATIVE Final    Comment: (NOTE) The Xpert Xpress SARS-CoV-2/FLU/RSV plus assay is intended as an aid in  the diagnosis of influenza from Nasopharyngeal swab specimens and should not be used as a sole basis for treatment. Nasal washings and aspirates are unacceptable for Xpert Xpress SARS-CoV-2/FLU/RSV testing.  Fact Sheet for Patients: EntrepreneurPulse.com.au  Fact Sheet for Healthcare Providers: IncredibleEmployment.be  This test is not yet approved or cleared by the [place] FDA and has been authorized for detection and/or diagnosis of SARS-CoV-2 by FDA under an Emergency Use Authorization (EUA). This EUA will remain in effect (meaning this test can be used) for the duration of the COVID-19 declaration under Section 564(b)(1) of the Act, 21 U.S.C. section 360bbb-3(b)(1), unless the authorization is terminated or revoked.     Resp Syncytial Virus by PCR NEGATIVE NEGATIVE Final    Comment: (NOTE) Fact Sheet for Patients: EntrepreneurPulse.com.au  Fact Sheet for Healthcare Providers: IncredibleEmployment.be  This test is not yet approved or cleared by the [place] FDA and has been authorized for detection and/or diagnosis of SARS-CoV-2 by FDA under an Emergency Use Authorization (EUA). This EUA will remain in effect (meaning this test can be used) for the duration of the COVID-19 declaration under Section 564(b)(1) of the Act, 21 U.S.C. section 360bbb-3(b)(1), unless the authorization is terminated or revoked.  Performed at [facility], [place] [address] [place], [place] [address]   MRSA Next Gen by PCR, Nasal     Status: None   Collection Time: 01/04/23 10:43 AM   Specimen: Nasal Mucosa; Nasal Swab  Result Value Ref Range Status   MRSA by PCR Next Gen NOT DETECTED NOT DETECTED Final    Comment: (NOTE) The GeneXpert MRSA Assay (FDA approved for NASAL specimens only), is one component of a comprehensive MRSA colonization surveillance program. It is not intended to diagnose MRSA infection  nor to guide or monitor treatment for MRSA infections. Test performance is not FDA approved in patients less than 11 years old. Performed at [facility], [place] [address] [place], [place] [address]   Urine Culture     Status: None   Collection Time: 01/04/23 11:58 AM   Specimen: Urine, Clean Catch  Result Value Ref Range Status   Specimen Description URINE, CLEAN CATCH  Final   Special Requests NONE  Final   Culture   Final    NO GROWTH Performed at [facility], [place] [address], [place],  [address]    Report Status 01/05/2023 FINAL  Final         Radiology Studies: No results found.      Scheduled Meds:  atorvastatin  10 mg Oral Daily   buprenorphine  2 patch Transdermal Weekly   carvedilol  12.5 mg Oral  Daily   divalproex  500 mg Oral Q12H   DULoxetine  60 mg Oral Daily   famotidine  40 mg Oral QHS   heparin  5,000 Units Subcutaneous Q8H   insulin aspart  0-15 Units Subcutaneous TID WC   insulin aspart  0-5 Units Subcutaneous QHS   insulin glargine-yfgn  10 Units Subcutaneous Daily   levETIRAcetam  1,000 mg Oral BID   mometasone-formoterol  2 puff Inhalation BID   pantoprazole  40 mg Oral Daily   pramipexole  1 mg Oral QHS   pregabalin  200 mg Oral BID   sodium chloride flush  3 mL Intravenous Q12H   Continuous Infusions:  sodium chloride     sodium chloride     vancomycin 1,000 mg (01/07/23 0901)     LOS: 3 days    Time spent: 35 minutes.     Alba Cory, MD [facility]   If 7PM-7AM, please contact night-coverage [email or web address]  01/07/2023, 12:26 PM

## 2023-01-07 NOTE — Plan of Care (Signed)

## 2023-01-07 NOTE — Progress Notes (Signed)
CSW spoke with at [facility] at [place] to order [facility] and portable tank. Jeanett Schlein states request will be sent to a different staff member at the agency and they will contact CSW.  Madilyn Fireman, MSW, LCSW Transitions of Care  Clinical Social Worker II [phone]

## 2023-01-07 NOTE — Consult Note (Signed)
WOC Nurse Consult Note: Reason for Consult:LE wound Wound type:scab RLE; pretibial. No open wounds Pressure Injury POA: NA Measurement:1cm x 1cm x 0cm scab Wound bed:NA Drainage (amount, consistency, odor) NA Periwound: bilateral hemosiderin staining. Has compression stockings at home that she does not wear, per patients. Has had Unna's boots in the past but she does not warrant application at this time.  Dressing procedure/placement/frequency: Ok to use silicone foam to the scabbed area if desired per the nursing skin care order set.   [place], [place], [place]

## 2023-01-07 NOTE — Progress Notes (Signed)
Physical Therapy Treatment Patient Details Name: Brenda Morgan MRN: [id number] DOB: Dec 10, 1956 Today's Date: 01/07/2023   History of Present Illness This is a 67 year old female admitted 01/03/23 with AMS and encephalopathy.  Pt with PMH seizures, HFpEF (2018), DM2, HTN, COPD, CKD, PVD, OSA no CPAP, chronic pain, fibromyalgia on opioids, NASH, past EtOH abuse, unintentional tylenol OD (2012), .    PT Comments    Pt limited today due to lethargy and soft BP.  She required min A transfers and ambulated short distance in room with min guard to min A for safety.  She did have a 12 point drop in systolic BP with standing (see below).  Notified RN and MD of lethargy and soft BP.    BP:  Supine 110/46 Sit: 109/62 Stand: 98/52- reports legs weak just standing Could not static stand 3 mins but with rest was able to walk and return to sitting BP 107/55     Recommendations for follow up therapy are one component of a multi-disciplinary discharge planning process, led by the attending physician.  Recommendations may be updated based on patient status, additional functional criteria and insurance authorization.  Follow Up Recommendations  Home health PT     Assistance Recommended at Discharge Intermittent Supervision/Assistance  Patient can return home with the following A little help with walking and/or transfers;A little help with bathing/dressing/bathroom;Assistance with cooking/housework;Assist for transportation;Help with stairs or ramp for entrance;Direct supervision/assist for medications management;Direct supervision/assist for financial management   Equipment Recommendations  None recommended by PT    Recommendations for Other Services       Precautions / Restrictions Precautions Precautions: Fall Restrictions Weight Bearing Restrictions: No     Mobility  Bed Mobility Overal bed mobility: Modified Independent             General bed mobility comments: HOB up, increased  time    Transfers Overall transfer level: Needs assistance Equipment used: Rolling walker (2 wheels) Transfers: Sit to/from Stand Sit to Stand: Min assist, From elevated surface           General transfer comment: Stood from bed x 2, chair x 3, toilet x 1 with cues for hand placement and light min A.  Required assist with ADLs due to lines.    Ambulation/Gait Ambulation/Gait assistance: Min guard Gait Distance (Feet): 30 Feet (30'x2) Assistive device: Rolling walker (2 wheels) Gait Pattern/deviations: Step-to pattern Gait velocity: decreased     General Gait Details: min guard for safety; cues for turns with lines; limited due to lethargy   Stairs             Wheelchair Mobility    Modified Rankin (Stroke Patients Only)       Balance Overall balance assessment: Needs assistance Sitting-balance support: No upper extremity supported Sitting balance-Leahy Scale: Fair Sitting balance - Comments: Requiring min A at time but b/c falling asleep   Standing balance support: Bilateral upper extremity supported Standing balance-Leahy Scale: Poor Standing balance comment: requiring UE support or leaning on wall                            Cognition Arousal/Alertness: Lethargic Behavior During Therapy: WFL for tasks assessed/performed Overall Cognitive Status: Impaired/Different from baseline Area of Impairment: Problem solving                             Problem Solving: Slow processing General Comments: Pt easily  arousable but falling asleep in sitting        Exercises      General Comments        Pertinent Vitals/Pain Pain Assessment Pain Assessment: No/denies pain    Home Living                          Prior Function            PT Goals (current goals can now be found in the care plan section) Progress towards PT goals: Progressing toward goals    Frequency    Min 3X/week      PT Plan Current plan  remains appropriate    Co-evaluation              AM-PAC PT "6 Clicks" Mobility   Outcome Measure  Help needed turning from your back to your side while in a flat bed without using bedrails?: A Little Help needed moving from lying on your back to sitting on the side of a flat bed without using bedrails?: A Little Help needed moving to and from a bed to a chair (including a wheelchair)?: A Little Help needed standing up from a chair using your arms (e.g., wheelchair or bedside chair)?: A Little Help needed to walk in hospital room?: A Little Help needed climbing 3-5 steps with a railing? : A Lot 6 Click Score: 17    End of Session Equipment Utilized During Treatment: Gait belt;Oxygen Activity Tolerance: Patient tolerated treatment well Patient left: in bed;with call bell/phone within reach;with bed alarm set (returned to bed due to lethargy) Nurse Communication: Mobility status PT Visit Diagnosis: Unsteadiness on feet (R26.81);Other abnormalities of gait and mobility (R26.89);Other symptoms and signs involving the nervous system (R29.898)     Time: 3244-0102 PT Time Calculation (min) (ACUTE ONLY): 39 min  Charges:  $Gait Training: 8-22 mins $Therapeutic Activity: 23-37 mins                     Abran Richard, PT Acute Rehab [facility] Rehab [phone]    Karlton Lemon 01/07/2023, 11:38 AM

## 2023-01-07 NOTE — Progress Notes (Signed)
Occupational Therapy Treatment Patient Details Name: Brenda Morgan MRN: [id number] DOB: [birth year] Today's Date: 01/07/2023   History of present illness This is a 67 year old female with PMH seizures, HFpEF (2018), DM2, HTN, COPD, CKD, PVD, OSA no CPAP, chronic pain, fibromyalgia on opioids, NASH, past EtOH abuse, unintentional tylenol OD (2012), who presents due to altered mentation, unsteady gait.  Patient fell 3 times.   OT comments  Pt reports having completed sponge bathing and dressing with nursing staff already this morning. Pt mobilizing OOB with RW for toileting and returned to bed. Positioned R UE on pillow for IV medication once returned to supine. NO c/o dizziness, which pt states is unusual in the morning.    Recommendations for follow up therapy are one component of a multi-disciplinary discharge planning process, led by the attending physician.  Recommendations may be updated based on patient status, additional functional criteria and insurance authorization.    Follow Up Recommendations  Home health OT     Assistance Recommended at Discharge Frequent or constant Supervision/Assistance  Patient can return home with the following  A little help with bathing/dressing/bathroom;A little help with walking and/or transfers;Help with stairs or ramp for entrance   Equipment Recommendations  None recommended by OT    Recommendations for Other Services      Precautions / Restrictions Precautions Precautions: Fall Restrictions Weight Bearing Restrictions: No       Mobility Bed Mobility Overal bed mobility: Modified Independent             General bed mobility comments: HOB up, increased time    Transfers Overall transfer level: Needs assistance Equipment used: Rolling walker (2 wheels) Transfers: Sit to/from Stand Sit to Stand: Min assist, From elevated surface                 Balance Overall balance assessment: Needs assistance   Sitting  balance-Leahy Scale: Good       Standing balance-Leahy Scale: Poor                             ADL either performed or assessed with clinical judgement   ADL Overall ADL's : Needs assistance/impaired     Grooming: Wash/dry hands;Sitting;Set up                   Toilet Transfer: Minimal assistance;BSC/3in1;Rolling walker (2 wheels)   Toileting- Clothing Manipulation and Hygiene: Minimal assistance       Functional mobility during ADLs: Minimal assistance;Rolling walker (2 wheels)      Extremity/Trunk Assessment              Vision       Perception     Praxis      Cognition Arousal/Alertness: Awake/alert Behavior During Therapy: WFL for tasks assessed/performed Overall Cognitive Status: Impaired/Different from baseline Area of Impairment: Problem solving                             Problem Solving: Slow processing General Comments: likely near baseline        Exercises      Shoulder Instructions       General Comments      Pertinent Vitals/ Pain       Pain Assessment Pain Assessment: No/denies pain  Home Living  Prior Functioning/Environment              Frequency  Min 2X/week        Progress Toward Goals  OT Goals(current goals can now be found in the care plan section)  Progress towards OT goals: Progressing toward goals  Acute Rehab OT Goals OT Goal Formulation: With patient Time For Goal Achievement: 01/19/23 Potential to Achieve Goals: Good  Plan Discharge plan remains appropriate    Co-evaluation                 AM-PAC OT "6 Clicks" Daily Activity     Outcome Measure   Help from another person eating meals?: None Help from another person taking care of personal grooming?: A Little Help from another person toileting, which includes using toliet, bedpan, or urinal?: A Little Help from another person bathing (including  washing, rinsing, drying)?: A Little Help from another person to put on and taking off regular upper body clothing?: A Little Help from another person to put on and taking off regular lower body clothing?: A Little 6 Click Score: 19    End of Session Equipment Utilized During Treatment: Rolling walker (2 wheels);Gait belt  OT Visit Diagnosis: Unsteadiness on feet (R26.81);Other abnormalities of gait and mobility (R26.89);Repeated falls (R29.6);Muscle weakness (generalized) (M62.81);Pain   Activity Tolerance Patient tolerated treatment well   Patient Left in bed;with call bell/phone within reach;Other (comment) (returned to bed for IV)   Nurse Communication          Time: 1607-3710 OT Time Calculation (min): 17 min  Charges: OT General Charges $OT Visit: 1 Visit OT Treatments $Self Care/Home Management : 8-22 mins  Cleta Alberts, OTR/L Acute Rehabilitation Services Office: [phone]   Malka So 01/07/2023, 10:28 AM

## 2023-01-07 NOTE — Progress Notes (Signed)
Mobility Specialist Progress Note:   01/07/23 1540  Mobility  Activity Stood at bedside (STS x5)  Level of Assistance Minimal assist, patient does 75% or more  Assistive Device Front wheel walker  Activity Response Tolerated fair  Mobility Referral Yes  $Mobility charge 1 Mobility   Pt received in bed and agreeable. Pt stood from EOB five times for 10 seconds each time with MinA to stand and sit safely. C/o fatigue. No c/o pain or SOB. Pt left sitting EOB with all needs met, call bell in reach, and family in room.   Andrey Campanile Mobility Specialist Please contact via SecureChat or  Rehab office at [phone]

## 2023-01-08 DIAGNOSIS — E8809 Other disorders of plasma-protein metabolism, not elsewhere classified: Secondary | ICD-10-CM | POA: Diagnosis not present

## 2023-01-08 LAB — CBC
HCT: 30 % — ABNORMAL LOW (ref 36.0–46.0)
Hemoglobin: 9.5 g/dL — ABNORMAL LOW (ref 12.0–15.0)
MCH: 29.1 pg (ref 26.0–34.0)
MCHC: 31.7 g/dL (ref 30.0–36.0)
MCV: 91.7 fL (ref 80.0–100.0)
Platelets: 122 10*3/uL — ABNORMAL LOW (ref 150–400)
RBC: 3.27 MIL/uL — ABNORMAL LOW (ref 3.87–5.11)
RDW: 14.2 % (ref 11.5–15.5)
WBC: 5.6 10*3/uL (ref 4.0–10.5)
nRBC: 0 % (ref 0.0–0.2)

## 2023-01-08 LAB — BASIC METABOLIC PANEL
Anion gap: 9 (ref 5–15)
BUN: 27 mg/dL — ABNORMAL HIGH (ref 8–23)
CO2: 31 mmol/L (ref 22–32)
Calcium: 7.8 mg/dL — ABNORMAL LOW (ref 8.9–10.3)
Chloride: 99 mmol/L (ref 98–111)
Creatinine, Ser: 1.12 mg/dL — ABNORMAL HIGH (ref 0.44–1.00)
GFR, Estimated: 55 mL/min — ABNORMAL LOW (ref >60)
Glucose, Bld: 88 mg/dL (ref 70–99)
Potassium: 4.3 mmol/L (ref 3.5–5.1)
Sodium: 139 mmol/L (ref 135–145)

## 2023-01-08 LAB — GLUCOSE, CAPILLARY
Glucose-Capillary: 139 mg/dL — ABNORMAL HIGH (ref 70–99)
Glucose-Capillary: 141 mg/dL — ABNORMAL HIGH (ref 70–99)
Glucose-Capillary: 118 mg/dL — ABNORMAL HIGH (ref 70–99)
Glucose-Capillary: 149 mg/dL — ABNORMAL HIGH (ref 70–99)

## 2023-01-08 LAB — AMMONIA: Ammonia: 49 umol/L — ABNORMAL HIGH (ref 9–35)

## 2023-01-08 MED ORDER — CARVEDILOL 12.5 MG PO TABS: 12.5000 mg | ORAL_TABLET | Freq: Every day | ORAL | 0 refills | Status: DC

## 2023-01-08 MED ORDER — POTASSIUM CHLORIDE CRYS ER 20 MEQ PO TBCR: 20.0000 meq | EXTENDED_RELEASE_TABLET | Freq: Every day | ORAL | 0 refills | Status: AC | PRN

## 2023-01-08 MED ORDER — FUROSEMIDE 40 MG PO TABS: 40.0000 mg | ORAL_TABLET | Freq: Every day | ORAL | 0 refills | Status: AC | PRN

## 2023-01-08 MED ORDER — DOXYCYCLINE HYCLATE 50 MG PO CAPS: 100.0000 mg | ORAL_CAPSULE | Freq: Two times a day (BID) | ORAL | 0 refills | Status: DC

## 2023-01-08 NOTE — Progress Notes (Signed)
CSW spoke with patient's husband Jeneen Rinks who states his son is in route now to pick up the patient.  CSW spoke with Joellen Jersey at [facility] who states the agency cannot deliver a portable tank until after 3pm today.  CSW spoke with [place] [facility] [employer] who states the agency can supply the patient with a portable tank for transportation home.  Madilyn Fireman, MSW, LCSW Transitions of Care  Clinical Social Worker II [phone]

## 2023-01-08 NOTE — Progress Notes (Signed)
Physical Therapy Treatment Patient Details Name: Brenda Morgan MRN: [id number] DOB: [birth year] Today's Date: 01/08/2023   History of Present Illness This is a 67 year old female admitted 01/03/23 with AMS and encephalopathy.  Pt with PMH seizures, HFpEF (2018), DM2, HTN, COPD, CKD, PVD, OSA no CPAP, chronic pain, fibromyalgia on opioids, NASH, past EtOH abuse, unintentional tylenol OD (2012), .    PT Comments    Received order to reassess today as pt  was lightheaded and unable to ambulate.  She required mod A for transfers and to stand at EOB.  Unable to ambulate due to falling asleep, unstable, posterior lean.  Pt falling asleep without constant stimuli.  Her orthostatic BP were soft but stable see below.  HR was 55-60 during session (son reports 10's earlier with nursing, but unsure of accuracy).  O2 sats 100% on 2 LPM. Notified MD of lethargy and vitals.  Son present and reports pt will not have 24 hr assist as initially reported.  Due to no 24 hr assist and declined performance updated recommendation to SNF unless able to progress.   BP Supine 110/47 BP Sit 115/49 BP stand 113/46   Recommendations for follow up therapy are one component of a multi-disciplinary discharge planning process, led by the attending physician.  Recommendations may be updated based on patient status, additional functional criteria and insurance authorization.  Follow Up Recommendations  Skilled nursing-short term rehab (<3 hours/day) Can patient physically be transported by private vehicle: No (not today, may progress)   Assistance Recommended at Discharge Frequent or constant Supervision/Assistance  Patient can return home with the following A lot of help with walking and/or transfers;A lot of help with bathing/dressing/bathroom;Assistance with cooking/housework;Help with stairs or ramp for entrance   Equipment Recommendations  None recommended by PT    Recommendations for Other Services        Precautions / Restrictions Precautions Precautions: Fall Precaution Comments: orthostatic blood pressure     Mobility  Bed Mobility Overal bed mobility: Needs Assistance Bed Mobility: Supine to Sit, Sit to Supine     Supine to sit: Min guard Sit to supine: Min guard   General bed mobility comments: HOB up, increased time    Transfers Overall transfer level: Needs assistance Equipment used: Rolling walker (2 wheels) Transfers: Sit to/from Stand Sit to Stand: Mod assist           General transfer comment: Pt stood x 2 requiring mod A with posterior lean.  Limited due to lethargy and c/o dizzy/lightheaded, see comments    Ambulation/Gait               General Gait Details: unable due to lightheaded, unstable, falling asleep   Stairs             Wheelchair Mobility    Modified Rankin (Stroke Patients Only)       Balance Overall balance assessment: Needs assistance Sitting-balance support: Bilateral upper extremity supported Sitting balance-Leahy Scale: Poor Sitting balance - Comments: Requiring min A, leaning posteriorly and slightly L, largely limited due to falling asleep   Standing balance support: Bilateral upper extremity supported Standing balance-Leahy Scale: Poor Standing balance comment: Requiring UE support and mod A.  Pt leaning posteriorly in standing and only able to maintain about 30 seconds                            Cognition Arousal/Alertness: Lethargic Behavior During Therapy: WFL for tasks assessed/performed Overall Cognitive Status:  Impaired/Different from baseline Area of Impairment: Problem solving                             Problem Solving: Slow processing General Comments: Pt awake and trying to eat but falling asleep.  Pt closing eyes/going to sleep in sitting and even in standing without constant stimulation.  Reports feeling lightheaded in sitting and standing.        Exercises       General Comments        Pertinent Vitals/Pain Pain Assessment Pain Assessment: No/denies pain    Home Living                          Prior Function            PT Goals (current goals can now be found in the care plan section) Progress towards PT goals: Not progressing toward goals - comment (limited by lethargy)    Frequency    Min 3X/week      PT Plan Discharge plan needs to be updated    Co-evaluation              AM-PAC PT "6 Clicks" Mobility   Outcome Measure  Help needed turning from your back to your side while in a flat bed without using bedrails?: A Little Help needed moving from lying on your back to sitting on the side of a flat bed without using bedrails?: A Little Help needed moving to and from a bed to a chair (including a wheelchair)?: A Lot Help needed standing up from a chair using your arms (e.g., wheelchair or bedside chair)?: A Lot Help needed to walk in hospital room?: Total Help needed climbing 3-5 steps with a railing? : Total 6 Click Score: 12    End of Session Equipment Utilized During Treatment: Gait belt;Oxygen Activity Tolerance: Patient limited by lethargy Patient left: in bed;with call bell/phone within reach;with bed alarm set;with family/visitor present Nurse Communication: Mobility status PT Visit Diagnosis: Unsteadiness on feet (R26.81);Other abnormalities of gait and mobility (R26.89);Other symptoms and signs involving the nervous system (R29.898)     Time: 8315-1761 PT Time Calculation (min) (ACUTE ONLY): 24 min  Charges:  $Therapeutic Activity: 23-37 mins                     Abran Richard, PT Acute Rehab [facility] [phone]    Karlton Lemon 01/08/2023, 3:25 PM

## 2023-01-08 NOTE — Progress Notes (Signed)
Patient verbalized slightly light headed upon rising and while both hands on the walker. Waited for about a minute before started walking from her room to the hall (about 15 feet). She again reported feeling dizzy. Spo2 of 92% via 2L [place]. DBP 95. We then turned around, back to her bed. Noted unsteadiness while she was sitting on the edge of the bed. MD made aware. Orthostatic BP initiated per MD's request. IV acces not available. MD will put order in when needed, per MD.

## 2023-01-08 NOTE — Progress Notes (Signed)
PROGRESS NOTE    Brenda Morgan  ZOX:[id number] DOB: 25-Dec-1955 DOA: 01/03/2023 PCP: Jennings Books, NP   Brief Narrative: 67 year old with past medical history significant for seizure, heart failure preserved ejection fraction, diabetes type 2, hypertension, COPD, PVD, CKD, OSA not on CPAP, chronic pain, fibromyalgia on opioids, NASH, past EtOH abuse, unintentional Tylenol overdose 2012, presents accompanied by husband due to altered mental status, and unsteady gait.  Patient has fallen twice the day of admission and once the day prior to admission.  She  has also been complaining lower extremity pain.    Assessment & Plan:   Principal Problem:   Altered mental status Active Problems:   COPD (chronic obstructive pulmonary disease) (HCC)   DM II (diabetes mellitus, type II), controlled ([place])   Hypertension associated with type 2 diabetes mellitus (HCC)   AMS (altered mental status)   Chronic pain   Restless legs syndrome   Sleep apnea, unspecified   Seizure disorder (HCC)   History of cerebral infarction   Hepatic fibrosis due to nonalcoholic fatty liver disease   Spinal cord stimulator present   Cellulitis   1-Acute Metabolic Encephalopathy:  -Could be related to polypharmacy, infectious process, hypoxia.  -Neurology consulted to assess with management of medications for seizures, and further  evaluation. -She had more difficulty finding words 1/13. ABG with hypoxia, placed on 2 L oxygen.  -She is less confuse, able to answer questions.  -Appreciate Neurology evaluation, patient with Delirium from infection, hypoxia.   2-Bilateral lower extremity cellulitis worse on the right with blister Continue with  IV vancomycin-- discharge on Doxy  LE redness improved.  Wound care consulted. Local care.   3-Seizure: I have resumed Keppra 1 g twice daily and Depakote 500 mg twice daily. Neurology consulted for further recommendation of medications. Plan to continue with  current regimen//    4-Frequent Fall/Dizziness/deconditioning.  Patient with frequent fall, she will need PT OT evaluation Received IV fluids 1/15 Orthostatic Vitals 1/16 negative.  Plan to get TED.  She continue to be weak, dizzy on ambulation. Plan to discontinue coreg, TED.  Will ask PT to evaluate, she doesn't have 24 hours care at home.    5-Acute on Chronic Diastolic HF;  Mild elevated BNP, LE edema.  Hold lasix due dizziness She will need Lasix PRN  COPD; continue with Advair and Albuterol/   HTN;  Monitor on losartan and coreg.   Chronic Pain:  Resume Cymbalta and lyrica.  Continue buprenorphine patch.   Diabetes;  Continue with SSI.   Sleep apnea; no compliant with CPAP.   NASH Ammonia level normal.       Estimated body mass index is 34.66 kg/m as calculated from the following:   Height as of this encounter: 5\' 6"  (1.676 m).   Weight as of this encounter: 97.4 kg.   DVT prophylaxis: Heparin Code Status: Full code Family Communication: Son at bedside 1/16 Disposition Plan:  Status is: Inpatient Remains inpatient appropriate because: Management of cellulitis and encephalopathy    Consultants:  Neurology   Procedures:    Antimicrobials:    Subjective: She is less confuse. Able to answer questions.  She got dzzy on ambulation with Nurse, walk only 15 feet.  Plan to cancer discharge due to dizziness, repeat PT eval.   Objective: Vitals:   01/08/23 1223 01/08/23 1231 01/08/23 1234 01/08/23 1242  BP: (!) 79/63 (!) 110/56 115/68 (!) 138/111  Pulse: (!) 58 (!) 57 (!) 58 (!) 57  Resp:  Temp:  98.2 F (36.8 C)    TempSrc:  Oral    SpO2: 99% 100% 100% 100%  Weight:      Height:        Intake/Output Summary (Last 24 hours) at 01/08/2023 1304 Last data filed at 01/07/2023 1943 Gross per 24 hour  Intake 301.66 ml  Output 700 ml  Net -398.34 ml    Filed Weights   01/03/23 1941 01/07/23 0542  Weight: 94.3 kg 97.4 kg     Examination:  General exam: NAD Respiratory system: CTA Cardiovascular system: S 1, S  2 RRR Gastrointestinal system: BS present, soft, nt Central nervous system: Alert, follows command Extremities: no edema, redness LE improving    Data Reviewed: I have personally reviewed following labs and imaging studies  CBC: Recent Labs  Lab 01/03/23 1949 01/05/23 0235 01/06/23 0302 01/07/23 0443 01/08/23 0718  WBC 5.2 5.4 4.8 5.3 5.6  NEUTROABS 2.6  --   --   --   --   HGB 10.7* 10.1* 10.3* 9.6* 9.5*  HCT 34.3* 31.7* 31.5* 29.8* 30.0*  MCV 92.7 91.6 90.0 91.7 91.7  PLT 132* 126* 133* 119* 122*    Basic Metabolic Panel: Recent Labs  Lab 01/04/23 0510 01/05/23 0235 01/06/23 0302 01/07/23 0443 01/08/23 0718  NA 139 137 138 137 139  K 3.4* 4.0 3.9 4.3 4.3  CL 100 98 98 96* 99  CO2 29 31 30 30 31   GLUCOSE 166* 182* 162* 220* 88  BUN 19 18 24* 31* 27*  CREATININE 1.02* 1.26* 1.20* 1.34* 1.12*  CALCIUM 8.3* 8.2* 8.3* 7.9* 7.8*    GFR: Estimated Creatinine Clearance: 58.9 mL/min (A) (by C-G formula based on SCr of 1.12 mg/dL (H)). Liver Function Tests: Recent Labs  Lab 01/03/23 1949  AST 13*  ALT 13  ALKPHOS 79  BILITOT 0.5  PROT 6.2*  ALBUMIN 2.4*    No results for input(s): "LIPASE", "AMYLASE" in the last 168 hours. Recent Labs  Lab 01/03/23 2200  AMMONIA 26    Coagulation Profile: No results for input(s): "INR", "PROTIME" in the last 168 hours. Cardiac Enzymes: Recent Labs  Lab 01/04/23 0510  CKTOTAL 48    BNP (last 3 results) No results for input(s): "PROBNP" in the last 8760 hours. HbA1C: No results for input(s): "HGBA1C" in the last 72 hours.  CBG: Recent Labs  Lab 01/07/23 1135 01/07/23 1651 01/07/23 1943 01/08/23 0805 01/08/23 1153  GLUCAP 128* 121* 102* 139* 141*    Lipid Profile: No results for input(s): "CHOL", "HDL", "LDLCALC", "TRIG", "CHOLHDL", "LDLDIRECT" in the last 72 hours. Thyroid Function Tests: No results for  input(s): "TSH", "T4TOTAL", "FREET4", "T3FREE", "THYROIDAB" in the last 72 hours.  Anemia Panel: No results for input(s): "VITAMINB12", "FOLATE", "FERRITIN", "TIBC", "IRON", "RETICCTPCT" in the last 72 hours.  Sepsis Labs: No results for input(s): "PROCALCITON", "LATICACIDVEN" in the last 168 hours.  Recent Results (from the past 240 hour(s))  Resp panel by RT-PCR (RSV, Flu A&B, Covid) Anterior Nasal Swab     Status: None   Collection Time: 01/03/23  9:49 PM   Specimen: Anterior Nasal Swab  Result Value Ref Range Status   SARS Coronavirus 2 by RT PCR NEGATIVE NEGATIVE Final    Comment: (NOTE) SARS-CoV-2 target nucleic acids are NOT DETECTED.  The SARS-CoV-2 RNA is generally detectable in upper respiratory specimens during the acute phase of infection. The lowest concentration of SARS-CoV-2 viral copies this assay can detect is 138 copies/mL. A negative result does not preclude  SARS-Cov-2 infection and should not be used as the sole basis for treatment or other patient management decisions. A negative result may occur with  improper specimen collection/handling, submission of specimen other than nasopharyngeal swab, presence of viral mutation(s) within the areas targeted by this assay, and inadequate number of viral copies(<138 copies/mL). A negative result must be combined with clinical observations, patient history, and epidemiological information. The expected result is Negative.  Fact Sheet for Patients:  BloggerCourse.com  Fact Sheet for Healthcare Providers:  SeriousBroker.it  This test is no t yet approved or cleared by the [place] FDA and  has been authorized for detection and/or diagnosis of SARS-CoV-2 by FDA under an Emergency Use Authorization (EUA). This EUA will remain  in effect (meaning this test can be used) for the duration of the COVID-19 declaration under Section 564(b)(1) of the Act, 21 U.S.C.section  360bbb-3(b)(1), unless the authorization is terminated  or revoked sooner.       Influenza A by PCR NEGATIVE NEGATIVE Final   Influenza B by PCR NEGATIVE NEGATIVE Final    Comment: (NOTE) The Xpert Xpress SARS-CoV-2/FLU/RSV plus assay is intended as an aid in the diagnosis of influenza from Nasopharyngeal swab specimens and should not be used as a sole basis for treatment. Nasal washings and aspirates are unacceptable for Xpert Xpress SARS-CoV-2/FLU/RSV testing.  Fact Sheet for Patients: BloggerCourse.com  Fact Sheet for Healthcare Providers: SeriousBroker.it  This test is not yet approved or cleared by the [place] FDA and has been authorized for detection and/or diagnosis of SARS-CoV-2 by FDA under an Emergency Use Authorization (EUA). This EUA will remain in effect (meaning this test can be used) for the duration of the COVID-19 declaration under Section 564(b)(1) of the Act, 21 U.S.C. section 360bbb-3(b)(1), unless the authorization is terminated or revoked.     Resp Syncytial Virus by PCR NEGATIVE NEGATIVE Final    Comment: (NOTE) Fact Sheet for Patients: BloggerCourse.com  Fact Sheet for Healthcare Providers: SeriousBroker.it  This test is not yet approved or cleared by the [place] FDA and has been authorized for detection and/or diagnosis of SARS-CoV-2 by FDA under an Emergency Use Authorization (EUA). This EUA will remain in effect (meaning this test can be used) for the duration of the COVID-19 declaration under Section 564(b)(1) of the Act, 21 U.S.C. section 360bbb-3(b)(1), unless the authorization is terminated or revoked.  Performed at [facility], [address], [place], [place] [address]   MRSA Next Gen by PCR, Nasal     Status: None   Collection Time: 01/04/23 10:43 AM   Specimen: Nasal Mucosa; Nasal Swab  Result Value Ref Range Status    MRSA by PCR Next Gen NOT DETECTED NOT DETECTED Final    Comment: (NOTE) The GeneXpert MRSA Assay (FDA approved for NASAL specimens only), is one component of a comprehensive MRSA colonization surveillance program. It is not intended to diagnose MRSA infection nor to guide or monitor treatment for MRSA infections. Test performance is not FDA approved in patients less than 44 years old. Performed at [facility], [address], [place], [place] [address]   Urine Culture     Status: None   Collection Time: 01/04/23 11:58 AM   Specimen: Urine, Clean Catch  Result Value Ref Range Status   Specimen Description URINE, CLEAN CATCH  Final   Special Requests NONE  Final   Culture   Final    NO GROWTH Performed at [facility], [address], [place], [place] [address]    Report Status 01/05/2023 FINAL  Final         Radiology Studies: No results found.      Scheduled Meds:  atorvastatin  10 mg Oral Daily   buprenorphine  2 patch Transdermal Weekly   divalproex  500 mg Oral Q12H   DULoxetine  60 mg Oral Daily   famotidine  40 mg Oral QHS   heparin  5,000 Units Subcutaneous Q8H   insulin aspart  0-15 Units Subcutaneous TID WC   insulin aspart  0-5 Units Subcutaneous QHS   insulin glargine-yfgn  10 Units Subcutaneous Daily   levETIRAcetam  1,000 mg Oral BID   mometasone-formoterol  2 puff Inhalation BID   pantoprazole  40 mg Oral Daily   pramipexole  1 mg Oral QHS   pregabalin  200 mg Oral BID   sodium chloride flush  3 mL Intravenous Q12H   Continuous Infusions:  sodium chloride     vancomycin 1,000 mg (01/08/23 1035)     LOS: 4 days    Time spent: 35 minutes.     Elmarie Shiley, MD [facility]   If 7PM-7AM, please contact night-coverage [email or web address]  01/08/2023, 1:04 PM

## 2023-01-08 NOTE — Plan of Care (Signed)

## 2023-01-09 ENCOUNTER — Inpatient Hospital Stay (HOSPITAL_COMMUNITY): Payer: BC Managed Care – PPO

## 2023-01-09 DIAGNOSIS — Z79899 Other long term (current) drug therapy: Secondary | ICD-10-CM | POA: Diagnosis not present

## 2023-01-09 LAB — AMMONIA: Ammonia: 32 umol/L (ref 9–35)

## 2023-01-09 LAB — BASIC METABOLIC PANEL
Anion gap: 9 (ref 5–15)
BUN: 32 mg/dL — ABNORMAL HIGH (ref 8–23)
CO2: 33 mmol/L — ABNORMAL HIGH (ref 22–32)
Calcium: 8.4 mg/dL — ABNORMAL LOW (ref 8.9–10.3)
Chloride: 96 mmol/L — ABNORMAL LOW (ref 98–111)
Creatinine, Ser: 1.32 mg/dL — ABNORMAL HIGH (ref 0.44–1.00)
GFR, Estimated: 45 mL/min — ABNORMAL LOW (ref >60)
Glucose, Bld: 147 mg/dL — ABNORMAL HIGH (ref 70–99)
Potassium: 4.6 mmol/L (ref 3.5–5.1)
Sodium: 138 mmol/L (ref 135–145)

## 2023-01-09 LAB — BLOOD GAS, ARTERIAL
Acid-Base Excess: 7 mmol/L — ABNORMAL HIGH (ref 0.0–2.0)
Bicarbonate: 34.8 mmol/L — ABNORMAL HIGH (ref 20.0–28.0)
Drawn by: 137461
O2 Saturation: 96 %
Patient temperature: 36.4
pCO2 arterial: 61 mmHg — ABNORMAL HIGH (ref 32–48)
pH, Arterial: 7.36 (ref 7.35–7.45)
pO2, Arterial: 67 mmHg — ABNORMAL LOW (ref 83–108)

## 2023-01-09 LAB — HEPATIC FUNCTION PANEL
ALT: 12 U/L (ref 0–44)
AST: 13 U/L — ABNORMAL LOW (ref 15–41)
Albumin: 2.2 g/dL — ABNORMAL LOW (ref 3.5–5.0)
Alkaline Phosphatase: 50 U/L (ref 38–126)
Bilirubin, Direct: <0.1 mg/dL (ref 0.0–0.2)
Total Bilirubin: 0.2 mg/dL — ABNORMAL LOW (ref 0.3–1.2)
Total Protein: 5.8 g/dL — ABNORMAL LOW (ref 6.5–8.1)

## 2023-01-09 LAB — CBC
HCT: 30.4 % — ABNORMAL LOW (ref 36.0–46.0)
Hemoglobin: 9.7 g/dL — ABNORMAL LOW (ref 12.0–15.0)
MCH: 29.2 pg (ref 26.0–34.0)
MCHC: 31.9 g/dL (ref 30.0–36.0)
MCV: 91.6 fL (ref 80.0–100.0)
Platelets: 119 10*3/uL — ABNORMAL LOW (ref 150–400)
RBC: 3.32 MIL/uL — ABNORMAL LOW (ref 3.87–5.11)
RDW: 14 % (ref 11.5–15.5)
WBC: 5.7 10*3/uL (ref 4.0–10.5)
nRBC: 0 % (ref 0.0–0.2)

## 2023-01-09 LAB — VALPROIC ACID LEVEL: Valproic Acid Lvl: 46 ug/mL — ABNORMAL LOW (ref 50.0–100.0)

## 2023-01-09 LAB — GLUCOSE, CAPILLARY
Glucose-Capillary: 131 mg/dL — ABNORMAL HIGH (ref 70–99)
Glucose-Capillary: 179 mg/dL — ABNORMAL HIGH (ref 70–99)
Glucose-Capillary: 240 mg/dL — ABNORMAL HIGH (ref 70–99)
Glucose-Capillary: 139 mg/dL — ABNORMAL HIGH (ref 70–99)
Glucose-Capillary: 193 mg/dL — ABNORMAL HIGH (ref 70–99)

## 2023-01-09 MED ORDER — BUPRENORPHINE 7.5 MCG/HR TD PTWK
1.0000 | MEDICATED_PATCH | TRANSDERMAL | Status: DC
  Filled 2023-01-09: qty 1

## 2023-01-09 MED ORDER — DOXYCYCLINE HYCLATE 100 MG PO TABS
100.0000 mg | ORAL_TABLET | Freq: Two times a day (BID) | ORAL | Status: AC
  Administered 2023-01-09 – 2023-01-10 (×4): 100 mg via ORAL
  Filled 2023-01-09 (×4): qty 1

## 2023-01-09 NOTE — [place] FL2 (Signed)
[place] MEDICAID FL2 LEVEL OF CARE FORM     IDENTIFICATION  Patient Name: Brenda Morgan Birthdate: Oct 23, 1956 Sex: female Admission Date (Current Location): 01/03/2023  [facility] [place] Number:  Herbalist and Address:  [facility] [place], [place] [address] [place], [place] [address]      Provider Number: [id number]  Attending Physician Name and Address:  Lorella Nimrod, MD  Relative Name and Phone Number:       Current Level of Care:   Recommended Level of Care: [place] Prior Approval Number:    Date Approved/Denied:   PASRR Number: Pending  Discharge Plan: SNF    Current Diagnoses: Patient Active Problem List   Diagnosis Date Noted   Cellulitis 01/04/2023   Altered mental status 01/04/2023   Chronic pain 04/29/2022   Obesity (BMI 30.0-34.9) 04/29/2022   Seizure disorder ([place]) 04/29/2022   Hepatic fibrosis due to nonalcoholic fatty liver disease    Spinal cord stimulator present    Toxic encephalopathy 04/28/2022   AMS (altered mental status)    Stroke-like symptoms    Hypokalemia    History of cerebral infarction 10/18/2021   Venous stasis dermatitis of both lower extremities 12/21/2020   Cerebral atrophy, Head CT ([place]) 83/38/2505   Hardware complicating wound infection ([place]) 08/02/2020   COPD (chronic obstructive pulmonary disease) ([place]) 08/01/2020   DM II (diabetes mellitus, type II), controlled ([place]) 08/01/2020   Hypertension associated with type 2 diabetes mellitus ([place]) 08/01/2020   Lumbar spinal stenosis 06/28/2020   Gastro-esophageal reflux disease without esophagitis 07/24/2017   Hyperlipidemia associated with type 2 diabetes mellitus ([place]) 07/24/2017   Restless legs syndrome 07/24/2017   Sleep apnea, unspecified 07/24/2017    Orientation RESPIRATION BLADDER Height & Weight     Self  Normal, O2 (2L baseline) Continent, External catheter Weight: 214 lb 11.7 oz (97.4 kg) Height:  5\' 6"  (167.6 cm)   BEHAVIORAL SYMPTOMS/MOOD NEUROLOGICAL BOWEL NUTRITION STATUS      Continent Diet  AMBULATORY STATUS COMMUNICATION OF NEEDS Skin   Limited Assist Verbally Normal                       Personal Care Assistance Level of Assistance  Bathing, Feeding, Dressing Bathing Assistance: Limited assistance Feeding assistance: Limited assistance Dressing Assistance: Limited assistance     Functional Limitations Info  Sight, Hearing, Speech Sight Info: Adequate Hearing Info: Adequate Speech Info: Adequate    SPECIAL CARE FACTORS FREQUENCY  PT (By licensed PT), OT (By licensed OT)     PT Frequency: 5x weekly OT Frequency: 5x weekly            Contractures Contractures Info: Not present    Additional Factors Info  Code Status, Allergies Code Status Info: Full Code Allergies Info: Amlodipine, Naltrexone, Pencillins           Current Medications (01/09/2023):  This is the current hospital active medication list Current Facility-Administered Medications  Medication Dose Route Frequency Provider Last Rate Last Admin   0.9 %  sodium chloride infusion  250 mL Intravenous PRN Crosley, Debby, MD       acetaminophen (TYLENOL) tablet 650 mg  650 mg Oral Q6H PRN Crosley, Debby, MD       Or   acetaminophen (TYLENOL) suppository 650 mg  650 mg Rectal Q6H PRN Crosley, Debby, MD       atorvastatin (LIPITOR) tablet 10 mg  10 mg Oral Daily Regalado, Belkys A, MD  10 mg at 01/08/23 1036   azelastine (ASTELIN) 0.1 % nasal spray 2 spray  2 spray Each Nare PRN Regalado, Belkys A, MD       buprenorphine (BUTRANS) 7.5 MCG/HR 2 patch  2 patch Transdermal Weekly Regalado, Belkys A, MD   2 patch at 01/05/23 1602   divalproex (DEPAKOTE) DR tablet 500 mg  500 mg Oral Q12H Regalado, Belkys A, MD   500 mg at 01/08/23 2101   DULoxetine (CYMBALTA) DR capsule 60 mg  60 mg Oral Daily Regalado, Belkys A, MD   60 mg at 01/08/23 1036   famotidine (PEPCID) tablet 40 mg  40 mg Oral QHS Crosley, Debby, MD   40 mg  at 01/08/23 2102   heparin injection 5,000 Units  5,000 Units Subcutaneous Q8H Crosley, Debby, MD   5,000 Units at 01/09/23 0546   insulin aspart (novoLOG) injection 0-15 Units  0-15 Units Subcutaneous TID WC Crosley, Debby, MD   2 Units at 01/08/23 1404   insulin aspart (novoLOG) injection 0-5 Units  0-5 Units Subcutaneous QHS Quintella Baton, MD   2 Units at 01/04/23 2203   insulin glargine-yfgn (SEMGLEE) injection 10 Units  10 Units Subcutaneous Daily Regalado, Belkys A, MD   10 Units at 01/08/23 1037   lactulose (CHRONULAC) 10 GM/15ML solution 10 g  10 g Oral Daily PRN Regalado, Belkys A, MD       levETIRAcetam (KEPPRA) tablet 1,000 mg  1,000 mg Oral BID Regalado, Belkys A, MD   1,000 mg at 01/08/23 2102   mometasone-formoterol (DULERA) 200-5 MCG/ACT inhaler 2 puff  2 puff Inhalation BID Regalado, Belkys A, MD   2 puff at 01/09/23 0724   ondansetron (ZOFRAN) injection 4 mg  4 mg Intravenous Q6H PRN Crosley, Debby, MD       Oral care mouth rinse  15 mL Mouth Rinse PRN Regalado, Belkys A, MD       pantoprazole (PROTONIX) EC tablet 40 mg  40 mg Oral Daily Regalado, Belkys A, MD   40 mg at 01/08/23 1036   pneumococcal 20-valent conjugate vaccine (PREVNAR 20) injection 0.5 mL  0.5 mL Intramuscular Prior to discharge Quintella Baton, MD       pramipexole (MIRAPEX) tablet 1 mg  1 mg Oral QHS Regalado, Belkys A, MD   1 mg at 01/08/23 2102   pregabalin (LYRICA) capsule 200 mg  200 mg Oral BID Regalado, Belkys A, MD   200 mg at 01/08/23 2101   senna-docusate (Senokot-S) tablet 1 tablet  1 tablet Oral QHS PRN Crosley, Debby, MD       sodium chloride flush (NS) 0.9 % injection 3 mL  3 mL Intravenous Q12H Crosley, Debby, MD   3 mL at 01/08/23 1037   sodium chloride flush (NS) 0.9 % injection 3 mL  3 mL Intravenous PRN Crosley, Debby, MD       vancomycin (VANCOCIN) IVPB 1000 mg/200 mL premix  1,000 mg Intravenous Q24H Jardin, Carla G, RPH   Stopping previously hung infusion at 01/08/23 2111     Discharge  Medications: Please see discharge summary for a list of discharge medications.  Relevant Imaging Results:  Relevant Lab Results:   Additional Information SSN: [place], [place]

## 2023-01-09 NOTE — Progress Notes (Signed)
CSW completed FL2 and faxed patient's clinical information out to obtain bed offers.  Madilyn Fireman, MSW, LCSW Transitions of Care  Clinical Social Worker II [phone]

## 2023-01-09 NOTE — Progress Notes (Signed)
This RN heard someone screaming help. Discovered patient on the floor with walker besides her and duffle bag next to the walker. Patient was not able to state what happened but mentioned that she hit her head. Assisted patient back into the bed.  Vitals were taken as seen on the flowsheet. Informed MD and family. No new orders.  Patient is back in bed, bed alarm is on, callbell within reach and bed in the lowest position.    01/09/23 0500  What Happened  Was fall witnessed? No  Was patient injured? Unsure  Patient found on floor  Found by Staff-comment (heard patient screaming "help")  Stated prior activity ambulating-unassisted  Provider Notification  Provider Name/Title Dr. Mitzi Hansen  Date Provider Notified 01/09/23  Time Provider Notified [phone]  Method of Notification [facility])  Notification Reason Fall  Provider response No new orders  Date of Provider Response 01/09/23  Time of Provider Response 0520  Follow Up  Family notified Yes - comment (Patient's husband, Mr. Zaley Talley)  Time family notified 0520  Additional tests No  Simple treatment Other (comment) (n/a)  Progress note created (see row info) Yes  Adult Fall Risk Assessment  Risk Factor Category (scoring not indicated) History of more than one fall within 6 months before admission (document High fall risk);Fall has occurred during this admission (document High fall risk);High fall risk per protocol (document High fall risk)  Age 66  Fall History: Fall within 6 months prior to admission 5  Elimination; Bowel and/or Urine Incontinence 0  Elimination; Bowel and/or Urine Urgency/Frequency 2  Medications: includes PCA/Opiates, Anti-convulsants, Anti-hypertensives, Diuretics, Hypnotics, Laxatives, Sedatives, and Psychotropics 3  Patient Care Equipment 1  Mobility-Assistance 2  Mobility-Gait 2  Mobility-Sensory Deficit 0  Altered awareness of immediate physical environment 1  Impulsiveness 0  Lack of  understanding of one's physical/cognitive limitations 4  Total Score 21  Patient Fall Risk Level High fall risk  Adult Fall Risk Interventions  Required Bundle Interventions *See Row Information* High fall risk - low, moderate, and high requirements implemented  Additional Interventions Use of appropriate toileting equipment (bedpan, BSC, etc.)  Fall intervention(s) refused/Patient educated regarding refusal Bed alarm;Nonskid socks;Open door if unsupervised;Supervision while toileting/edge of bed sitting;Yellow bracelet  Screening for Fall Injury Risk (To be completed on HIGH fall risk patients) - Assessing Need for Floor Mats  Risk For Fall Injury- Criteria for Floor Mats Admitted as a result of a fall  Will Implement Floor Mats Yes  Vitals  BP (!) 158/71  MAP (mmHg) 95  BP Method Automatic  Pulse Rate 66  Pulse Rate Source Monitor  Resp 20  Oxygen Therapy  SpO2 98 %  O2 Device Nasal Cannula  O2 Flow Rate (L/min) 2 L/min  Pain Assessment  Pain Scale 0-10  Pain Score 0  Neurological  Neuro (WDL) X  Level of Consciousness Alert  Orientation Level Oriented to person;Disoriented to time;Disoriented to place;Disoriented to situation  Counselling psychologist  RUE Motor Response Purposeful movement  RUE Motor Strength 4  LUE Motor Response Purposeful movement  LUE Motor Strength 4  RLE Motor Response Purposeful movement  RLE Motor Strength 4  LLE Motor Response Purposeful movement  LLE Motor Strength 3  Glasgow Coma Scale  Eye Opening 4  Best Verbal Response (NON-intubated) 4  Best Motor Response 6  Glasgow Coma Scale Score 14  Musculoskeletal  Musculoskeletal (WDL) X  Assistive Device Front wheel walker  Generalized Weakness Yes  Integumentary  Integumentary (WDL) X (no new changes compared to last assessment by this RN during this shift)  Skin Color Appropriate for ethnicity  Skin Integrity  Erythema/redness  Blister Location Leg  Blister Location Orientation Bilateral

## 2023-01-09 NOTE — Progress Notes (Signed)
PROGRESS NOTE    Brenda Morgan  GLO:[id number] DOB: [birth year] DOA: 01/03/2023 PCP: Jennings Books, NP   Brief Narrative: 67 year old with past medical history significant for seizure, heart failure preserved ejection fraction, diabetes type 2, hypertension, COPD, PVD, CKD, OSA not on CPAP, chronic pain, fibromyalgia on opioids, NASH, past EtOH abuse, unintentional Tylenol overdose 2012, presents accompanied by husband due to altered mental status, and unsteady gait.  Patient has fallen twice the day of admission and once the day prior to admission.  She  has also been complaining lower extremity pain.  1/17: Patient was alert and oriented today.  Husband was very concerned that she continued to have spells when she just stare and have urinary incontinence followed by some altered mental status.  He was upset that we have not still figure out what is going on with her.  Patient had routine EEG by neurology and it was negative.  They were recommending continuation of antiseizure medications.  There was also concern of polypharmacy and a lot of her medications were stopped.  Another message sent to neurology-patient will need continuous EEG.  They are also recommending decreasing the dose of Lyrica and buprenorphine. 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  -Neurology consulted to  assess with management of medications for seizures, and further  evaluation. -She had more difficulty finding words 1/13. ABG with hypoxia, placed on 2 L oxygen.  -She is less confuse, able to answer questions.  -Appreciate Neurology evaluation, patient with Delirium from infection, hypoxia.  -Neurology will revisit and patient might get continuous EEG.  2-Bilateral lower extremity cellulitis worse on the right with blister Continue with  IV vancomycin-- discharge on Doxy  LE redness improved.  Wound care consulted. Local care.   3-Seizure: I have resumed Keppra 1 g twice daily and Depakote 500 mg twice daily. Neurology consulted for further recommendation of medications. Plan to continue with current regimen//  -Might get continuous EEG  4-Frequent Fall/Dizziness/deconditioning.  Patient with frequent fall, she will need PT OT evaluation Received IV fluids 1/15 Orthostatic Vitals 1/16 negative.  Plan to get TED.  She continue to be weak, dizzy on ambulation. Plan to discontinue coreg, TED.  Will ask PT to evaluate, she doesn't have 24 hours care at home.    5-Acute on Chronic Diastolic HF;  Mild elevated BNP, LE edema.  Hold lasix due dizziness She will need Lasix PRN  COPD; continue with Advair and Albuterol/   HTN;  Monitor on losartan and coreg.   Chronic Pain:  Resume Cymbalta and lyrica.  Continue buprenorphine patch.   Diabetes;  Continue with SSI.   Sleep apnea; no compliant with CPAP.   NASH Ammonia level normal.    Estimated body mass index is 34.66 kg/m as calculated from the following:   Height as of this encounter: 5\' 6"  (1.676 m).   Weight as of this  encounter: 97.4 kg.   DVT prophylaxis: Heparin Code Status: Full code Family Communication: Discussed with husband at bedside Disposition Plan:  Status is: Inpatient Remains inpatient appropriate because: Management of cellulitis and encephalopathy    Consultants:  Neurology   Procedures:   EEG  Antimicrobials:    Subjective: Patient was seen and examined today.  She was alert and oriented and able to answer questions appropriately.  Husband at bedside who was very concerned about these recurrent spells, during those spells she stares and also had urinary incontinence followed by worsening of confusion.  Objective: Vitals:   01/09/23 0500 01/09/23 0738 01/09/23 0753 01/09/23 1100  BP: (!) 158/71  (!) 138/56 126/89  Pulse: 66 66 (!) 56 64  Resp: 20 18 18    Temp:   97.6 F (36.4 C)   TempSrc:   Oral   SpO2: 98%  100%   Weight:      Height:        Intake/Output Summary (Last 24 hours) at 01/09/2023 1509 Last data filed at 01/09/2023 0940 Gross per 24 hour  Intake 240 ml  Output --  Net 240 ml    Filed Weights   01/03/23 1941 01/07/23 0542  Weight: 94.3 kg 97.4 kg    Examination:  General.  Well-developed lady, in no acute distress. Pulmonary.  Lungs clear bilaterally, normal respiratory effort. CV.  Regular rate and rhythm, no JVD, rub or murmur. Abdomen.  Soft, nontender, nondistended, BS positive. CNS.  Alert and oriented .  No focal neurologic deficit. Extremities.  No edema, no cyanosis, pulses intact and symmetrical. Psychiatry.  Judgment and insight appears normal.    Data Reviewed: I have personally reviewed following labs and imaging studies  CBC: Recent Labs  Lab 01/03/23 1949 01/05/23 0235 01/06/23 0302 01/07/23 0443 01/08/23 0718 01/09/23 0708  WBC 5.2 5.4 4.8 5.3 5.6 5.7  NEUTROABS 2.6  --   --   --   --   --   HGB 10.7* 10.1* 10.3* 9.6* 9.5* 9.7*  HCT 34.3* 31.7* 31.5* 29.8* 30.0* 30.4*  MCV 92.7 91.6 90.0 91.7 91.7 91.6  PLT 132* 126* 133* 119* 122* 119*    Basic Metabolic Panel: Recent Labs  Lab 01/05/23 0235 01/06/23 0302 01/07/23 0443 01/08/23 0718 01/09/23 0708  NA 137 138 137 139 138  K 4.0 3.9 4.3 4.3 4.6  CL 98 98 96* 99 96*  CO2 31 30 30 31  33*  GLUCOSE 182* 162* 220* 88 147*  BUN 18 24* 31* 27* 32*   CREATININE 1.26* 1.20* 1.34* 1.12* 1.32*  CALCIUM 8.2* 8.3* 7.9* 7.8* 8.4*    GFR: Estimated Creatinine Clearance: 50 mL/min (A) (by C-G formula based on SCr of 1.32 mg/dL (H)). Liver Function Tests: Recent Labs  Lab 01/03/23 1949  AST 13*  ALT 13  ALKPHOS 79  BILITOT 0.5  PROT 6.2*  ALBUMIN 2.4*    No results for input(s): "LIPASE", "AMYLASE" in the last 168 hours. Recent Labs  Lab 01/03/23 2200 01/08/23 1457  AMMONIA 26 49*    Coagulation Profile: No results for input(s): "INR", "PROTIME" in the last 168 hours. Cardiac Enzymes: Recent Labs  Lab 01/04/23 0510  CKTOTAL 48    BNP (last 3 results) No results for input(s): "PROBNP" in the last 8760 hours. HbA1C: No results for input(s): "HGBA1C" in the last 72 hours.  CBG: Recent Labs  Lab 01/08/23 1153 01/08/23 1617 01/08/23 1944 01/09/23 0749 01/09/23 1131  GLUCAP 141* 118* 149* 131* 179*  Lipid Profile: No results for input(s): "CHOL", "HDL", "LDLCALC", "TRIG", "CHOLHDL", "LDLDIRECT" in the last 72 hours. Thyroid Function Tests: No results for input(s): "TSH", "T4TOTAL", "FREET4", "T3FREE", "THYROIDAB" in the last 72 hours.  Anemia Panel: No results for input(s): "VITAMINB12", "FOLATE", "FERRITIN", "TIBC", "IRON", "RETICCTPCT" in the last 72 hours.  Sepsis Labs: No results for input(s): "PROCALCITON", "LATICACIDVEN" in the last 168 hours.  Recent Results (from the past 240 hour(s))  Resp panel by RT-PCR (RSV, Flu A&B, Covid) Anterior Nasal Swab     Status: None   Collection Time: 01/03/23  9:49 PM   Specimen: Anterior Nasal Swab  Result Value Ref Range Status   SARS Coronavirus 2 by RT PCR NEGATIVE NEGATIVE Final    Comment: (NOTE) SARS-CoV-2 target nucleic acids are NOT DETECTED.  The SARS-CoV-2 RNA is generally detectable in upper respiratory specimens during the acute phase of infection. The lowest concentration of SARS-CoV-2 viral copies this assay can detect is 138 copies/mL. A  negative result does not preclude SARS-Cov-2 infection and should not be used as the sole basis for treatment or other patient management decisions. A negative result may occur with  improper specimen collection/handling, submission of specimen other than nasopharyngeal swab, presence of viral mutation(s) within the areas targeted by this assay, and inadequate number of viral copies(<138 copies/mL). A negative result must be combined with clinical observations, patient history, and epidemiological information. The expected result is Negative.  Fact Sheet for Patients:  EntrepreneurPulse.com.au  Fact Sheet for Healthcare Providers:  IncredibleEmployment.be  This test is no t yet approved or cleared by the [place] FDA and  has been authorized for detection and/or diagnosis of SARS-CoV-2 by FDA under an Emergency Use Authorization (EUA). This EUA will remain  in effect (meaning this test can be used) for the duration of the COVID-19 declaration under Section 564(b)(1) of the Act, 21 U.S.C.section 360bbb-3(b)(1), unless the authorization is terminated  or revoked sooner.       Influenza A by PCR NEGATIVE NEGATIVE Final   Influenza B by PCR NEGATIVE NEGATIVE Final    Comment: (NOTE) The Xpert Xpress SARS-CoV-2/FLU/RSV plus assay is intended as an aid in the diagnosis of influenza from Nasopharyngeal swab specimens and should not be used as a sole basis for treatment. Nasal washings and aspirates are unacceptable for Xpert Xpress SARS-CoV-2/FLU/RSV testing.  Fact Sheet for Patients: EntrepreneurPulse.com.au  Fact Sheet for Healthcare Providers: IncredibleEmployment.be  This test is not yet approved or cleared by the [place] FDA and has been authorized for detection and/or diagnosis of SARS-CoV-2 by FDA under an Emergency Use Authorization (EUA). This EUA will remain in effect (meaning this test can  be used) for the duration of the COVID-19 declaration under Section 564(b)(1) of the Act, 21 U.S.C. section 360bbb-3(b)(1), unless the authorization is terminated or revoked.     Resp Syncytial Virus by PCR NEGATIVE NEGATIVE Final    Comment: (NOTE) Fact Sheet for Patients: EntrepreneurPulse.com.au  Fact Sheet for Healthcare Providers: IncredibleEmployment.be  This test is not yet approved or cleared by the [place] FDA and has been authorized for detection and/or diagnosis of SARS-CoV-2 by FDA under an Emergency Use Authorization (EUA). This EUA will remain in effect (meaning this test can be used) for the duration of the COVID-19 declaration under Section 564(b)(1) of the Act, 21 U.S.C. section 360bbb-3(b)(1), unless the authorization is terminated or revoked.  Performed at [facility], [place] [address], [place], [place] [address]   MRSA Next Gen by  PCR, Nasal     Status: None   Collection Time: 01/04/23 10:43 AM   Specimen: Nasal Mucosa; Nasal Swab  Result Value Ref Range Status   MRSA by PCR Next Gen NOT DETECTED NOT DETECTED Final    Comment: (NOTE) The GeneXpert MRSA Assay (FDA approved for NASAL specimens only), is one component of a comprehensive MRSA colonization surveillance program. It is not intended to diagnose MRSA infection nor to guide or monitor treatment for MRSA infections. Test performance is not FDA approved in patients less than 11 years old. Performed at [facility], [place] [address], [place], [place] [address]   Urine Culture     Status: None   Collection Time: 01/04/23 11:58 AM   Specimen: Urine, Clean Catch  Result Value Ref Range Status   Specimen Description URINE, CLEAN CATCH  Final   Special Requests NONE  Final   Culture   Final    NO GROWTH Performed at [facility], [place] [address], [place], [place] [address]    Report Status 01/05/2023 FINAL  Final         Radiology  Studies: No results found.      Scheduled Meds:  atorvastatin  10 mg Oral Daily   buprenorphine  1 patch Transdermal Q Wed   divalproex  500 mg Oral Q12H   doxycycline  100 mg Oral Q12H   DULoxetine  60 mg Oral Daily   famotidine  40 mg Oral QHS   heparin  5,000 Units Subcutaneous Q8H   insulin aspart  0-15 Units Subcutaneous TID WC   insulin aspart  0-5 Units Subcutaneous QHS   insulin glargine-yfgn  10 Units Subcutaneous Daily   levETIRAcetam  1,000 mg Oral BID   mometasone-formoterol  2 puff Inhalation BID   pantoprazole  40 mg Oral Daily   pramipexole  1 mg Oral QHS   pregabalin  200 mg Oral BID   sodium chloride flush  3 mL Intravenous Q12H   Continuous Infusions:  sodium chloride       LOS: 5 days    Time spent: 40 minutes.   This record has been created using Systems analyst. Errors have been sought and corrected,but may not always be located. Such creation errors do not reflect on the standard of care.   Lorella Nimrod, MD [facility]   If 7PM-7AM, please contact night-coverage [email or web address]  01/09/2023, 3:09 PM

## 2023-01-09 NOTE — Plan of Care (Signed)
  Problem: Coping: Goal: Ability to adjust to condition or change in health will improve Outcome: Progressing   Problem: Fluid Volume: Goal: Ability to maintain a balanced intake and output will improve Outcome: Progressing   Problem: Clinical Measurements: Goal: Will remain free from infection Outcome: Progressing Goal: Diagnostic test results will improve Outcome: Progressing Goal: Respiratory complications will improve Outcome: Progressing Goal: Cardiovascular complication will be avoided Outcome: Progressing   Problem: Activity: Goal: Risk for activity intolerance will decrease Outcome: Progressing   Problem: Nutrition: Goal: Adequate nutrition will be maintained Outcome: Progressing   Problem: Coping: Goal: Level of anxiety will decrease Outcome: Progressing   Problem: Education: Goal: Ability to describe self-care measures that may prevent or decrease complications (Diabetes Survival Skills Education) will improve Outcome: Not Progressing Note: Patient only oriented to self   Problem: Health Behavior/Discharge Planning: Goal: Ability to identify and utilize available resources and services will improve Outcome: Not Progressing Note: Patient

## 2023-01-09 NOTE — Progress Notes (Signed)
LTM set up with Non MRI leads. [facility] is monitoring and test button was tested.

## 2023-01-09 NOTE — Progress Notes (Signed)
Subjective: Complains of lightheadedness with standing, but orthostatics checked by PT were negative.  She also states that her legs "just give out" her husband is describing spells, but they are always with standing.  Exam: Vitals:   01/09/23 0753 01/09/23 1100  BP: (!) 138/56 126/89  Pulse: (!) 56 64  Resp: 18   Temp: 97.6 F (36.4 C)   SpO2: 100%    Gen: In bed, NAD Resp: non-labored breathing, no acute distress Abd: soft, nt  Neuro: MS: Awake, alert, gives the year as 2022, month is January, location is accurate CN: Pupils equal round and reactive, visual fields full, extraocular movements intact, face symmetric Motor: She has prominent asterixis bilaterally with limited strength testing to some degree, but able to move all extremities Sensory: Intact to light touch  Pertinent Labs: Ammonia 49 yesterday Creatinine has increased from 1.0 to to 1.32 with an increase in BUN from 19->32   Impression: 67 year old female with a history of seizures, chronic pain, COPD, nonalcoholic fatty liver disease, hypertension, hyperlipidemia, OSA who presented with symptoms most consistent with delirium who was improving, but now still having episodes of concern.  Her episodes are all with standing, and I continue have a fairly low suspicion for seizures, but I would like to put the suspicion to rest and therefore will perform overnight EEG.  She is fairly drowsy, with multiple possible etiologies including her have history of liver dysfunction(NAFLD), COPD(possible hypercarbia), worsening renal function in the setting of renally cleared medications that can cause asterixis(Lyrica), narcotic use.   My first step would be to do an overnight EEG, check an ABG, rule out liver dysfunction.  Narcotics are the most common cause that I see of this in the hospital, but she is only on her home dose, may need to consider reducing this.  Recommendations: 1) Overnight EEG with video 2) if negative and no  other explanation, would consider decreasing Lyrica dose 3) ammonia, ABG 4) continue Keppra 1 g twice daily 5) if her ammonia is continuing to increase or she has transaminitis, may need to consider discontinuing Depakote 6) Decrease buprenorphine patch from 15 to 7.5 for now  Roland Rack, MD [facility] [phone]  If 7pm- 7am, please page neurology on call as listed in [place].

## 2023-01-09 NOTE — Progress Notes (Signed)
OT Cancellation Note  Patient Details Name: Brenda Morgan MRN: [id number] DOB: Apr 28, 1956   Cancelled Treatment:    Reason Eval/Treat Not Completed: Patient at procedure or test/ unavailable (Pt getting set up for overnight EEG.)  Malka So 01/09/2023, 3:03 PM Cleta Alberts, OTR/[facility] [place] Office: [phone]

## 2023-01-09 NOTE — Progress Notes (Signed)
Patient stated pressure was to much and would not be able to tolerate bipap or cpap for the night. Attempted multiple setting. Placed patient on 2lpm.

## 2023-01-09 NOTE — Progress Notes (Signed)
RN notified by attending team to remove 1 of 2 patches of buprenorphine the patient has on due to confusion.  60 RN removed 1 patch, 7.38mcg buprenorphine from patient.

## 2023-01-10 DIAGNOSIS — R4182 Altered mental status, unspecified: Secondary | ICD-10-CM | POA: Diagnosis not present

## 2023-01-10 DIAGNOSIS — G9340 Encephalopathy, unspecified: Secondary | ICD-10-CM

## 2023-01-10 DIAGNOSIS — Z79899 Other long term (current) drug therapy: Secondary | ICD-10-CM | POA: Diagnosis not present

## 2023-01-10 DIAGNOSIS — R569 Unspecified convulsions: Secondary | ICD-10-CM

## 2023-01-10 LAB — GLUCOSE, CAPILLARY
Glucose-Capillary: 98 mg/dL (ref 70–99)
Glucose-Capillary: 88 mg/dL (ref 70–99)
Glucose-Capillary: 110 mg/dL — ABNORMAL HIGH (ref 70–99)
Glucose-Capillary: 113 mg/dL — ABNORMAL HIGH (ref 70–99)
Glucose-Capillary: 165 mg/dL — ABNORMAL HIGH (ref 70–99)

## 2023-01-10 MED ORDER — PREGABALIN 100 MG PO CAPS
100.0000 mg | ORAL_CAPSULE | Freq: Three times a day (TID) | ORAL | Status: DC
  Administered 2023-01-10 – 2023-01-11 (×2): 100 mg via ORAL
  Filled 2023-01-10 (×2): qty 1

## 2023-01-10 MED ORDER — LACTULOSE 10 GM/15ML PO SOLN
10.0000 g | Freq: Every day | ORAL | Status: DC
  Administered 2023-01-10 – 2023-01-11 (×2): 10 g via ORAL
  Filled 2023-01-10 (×2): qty 15

## 2023-01-10 NOTE — Progress Notes (Addendum)
Subjective: Patient laying in bed on LTM. No family at the bedside. Patient is disoriented, with poor attention. LTM with generalized slowing, No seizures identified.   Exam: Vitals:   01/10/23 0203 01/10/23 0808  BP: (!) 150/56 (!) 144/60  Pulse: 61 62  Resp: 16 16  Temp: 98.5 F (36.9 C) 98.2 F (36.8 C)  SpO2: 100% 100%   Gen: In bed, NAD Resp: non-labored breathing, no acute distress Abd: soft, nt  Neuro: MS: Awake, alert, gives the year as 2023,  states age as 73, month is December, location is accurate, president was correct.  CN: Pupils equal round and reactive, visual fields full, extraocular movements intact, face symmetric Motor: She has prominent asterixis bilaterally with limited strength testing to some degree, but able to move all extremities Sensory: Intact to light touch  Pertinent Labs: Ammonia 49 ->32 Creatinine has increased from 1.0 to to 1.32 with an increase in BUN from 19->32 ABG pH 7.36, Co2 61, O2 67, HCO3 34.8 AST 13, ALT 12  LTM EEG 1/17-1/18:- Intermittent slow, generalized  This study is w suggestive of mild diffuse encephalopathy, nonspecific etiology.  No seizures or epileptiform discharges were seen throughout the recording. Please note that lack of epileptiform activity during interictal EEG does not exclude the diagnosis of epilepsy.  Impression: 67 year old female with a history of seizures, chronic pain, COPD, nonalcoholic fatty liver disease, hypertension, hyperlipidemia, OSA who presented with symptoms most consistent with delirium who was improving, but now still having episodes of concern.  Her episodes are all with standing, and I continue have a fairly low suspicion for seizures. Overnight EEG showed no epileptiform abnl. She was fairly drowsy yesterday and was found to be hypoxic and hypercarbic on ABG. She had bipap overnight and today is alert.  She does have asterixis which may be 2/2 lyrica in the setting of decreased renal clearance.  Will reduce dose and convert to tid dosing from bid. Narcotics are the most common cause that I see of this in the hospital, but she is only on her home dose, may need to consider reducing this.  Recommendations: 1) Delirium precautions  2) Will Discontinue LTM today  3) Recommend decreasing Lyrica to 100mg  TID 4) continue Keppra 1 g twice daily 5)  buprenorphine patch 7.5 for now 6) Neurology will sign off. Please call with questions or concerns   Beulah Gandy DNP, ACNPC-AG  [facility]   Discussed with Beulah Gandy NP, agree with plan as described above. Will be available prn for questions.  Su Monks, MD [facility] [phone]  If 7pm- 7am, please page neurology on call as listed in [place].

## 2023-01-10 NOTE — Progress Notes (Signed)
PROGRESS NOTE    Brenda Morgan  YNW:[id number] DOB: 1956-08-05 DOA: 01/03/2023 PCP: Jennings Books, NP   Brief Narrative: 67 year old with past medical history significant for seizure, heart failure preserved ejection fraction, diabetes type 2, hypertension, COPD, PVD, CKD, OSA not on CPAP, chronic pain, fibromyalgia on opioids, NASH, past EtOH abuse, unintentional Tylenol overdose 2012, presents accompanied by husband due to altered mental status, and unsteady gait.  Patient has fallen twice the day of admission and once the day prior to admission.  She  has also been complaining lower extremity pain.  1/17: Patient was alert and oriented today.  Husband was very concerned that she continued to have spells when she just stare and have urinary incontinence followed by some altered mental status.  He was upset that we have not still figure out what is going on with her.  Patient had routine EEG by neurology and it was negative.  They were recommending continuation of antiseizure medications.  There was also concern of polypharmacy and a lot of her medications were stopped.  Another message sent to neurology-patient will need continuous EEG.  They are also recommending decreasing the dose of Lyrica and buprenorphine. PT is recommending SNF  1/18: Mildly elevated blood pressure at 144/68 this morning.  ABG was checked yesterday afternoon due to recurrence of confusion and found to have CO2 of 61 and O2 of 67.  She was placed on BiPAP overnight.  Patient has an history of COPD and used to have BiPAP at home, apparently she was unable to use consistently due to some masking issues and it was taken away by insurance company.  Patient will need BiPAP at night as this intermittent confusion might be related to hypercapnia.  Ask TOC to help get her NIV  for home as that will help.  Overnight EEG completed-shows diffuse encephalopathy and no specific epileptiform changes. Labs with normal ammonia,  mildly low valproic acid level at 46. Patient apparently unable to tolerate BiPAP even with multiple different settings overnight.  Per patient she is claustrophobic and cannot wear a full facemask.  Talk with respiratory therapist, according to them we do not offer any nasal prongs in the hospital. Neurology again signed off with recommendations to decrease Lyrica to 100 mg 3 times daily.  Assessment & Plan:   Principal Problem:   Altered mental status Active Problems:   COPD (chronic obstructive pulmonary disease) (HCC)   DM II (diabetes mellitus, type II), controlled ([place])   Hypertension associated with type 2 diabetes mellitus (HCC)   AMS (altered mental status)   Chronic pain   Restless legs syndrome   Sleep apnea, unspecified   Seizure disorder (HCC)   History of cerebral infarction   Hepatic fibrosis due to nonalcoholic fatty liver disease   Spinal cord stimulator present   Cellulitis   1-Acute Metabolic Encephalopathy:  -Could be related to polypharmacy, infectious process, hypoxia.  -Neurology consulted to assess with management of medications for seizures, and further  evaluation. -She had more difficulty finding words 1/13. ABG with hypoxia, placed on 2 L oxygen.  -She is less confuse, able to answer questions.  -Appreciate Neurology evaluation, patient with Delirium from infection, hypoxia.  -Continuous EEG with diffuse encephalopathy and no specific epileptiform changes.  Repeat ABG with hypercarbia but patient is unable to tolerate BiPAP due to being claustrophobic with the mask. Cardiology decreased the dose of Lyrica to 100 mg TID  2-Bilateral lower extremity cellulitis worse on the right with blister Continue with  IV vancomycin-- discharge on Doxy  LE redness improved.  Wound care consulted. Local care.   3-Seizure: I have resumed Keppra 1 g twice daily and Depakote 500 mg twice daily. Neurology consulted for further recommendation of medications. Plan to  continue with current regimen//  -Continuous EEG negative for any epileptiform changes  4-Frequent Fall/Dizziness/deconditioning.  Patient with frequent fall, she will need PT OT evaluation Received IV fluids 1/15 Orthostatic Vitals 1/16 negative.  Plan to get TED.  She continue to be weak, dizzy on ambulation. Plan to discontinue coreg, TED.  PT is recommending SNF   5-Acute on Chronic Diastolic HF;  Mild elevated BNP, LE edema.  Hold lasix due dizziness She will need Lasix PRN  COPD; continue with Advair and Albuterol/   HTN;  Monitor on losartan and coreg.   Chronic Pain:  Resume Cymbalta and lyrica. Medical dose was decreased. Continue buprenorphine patch.   Diabetes;  Continue with SSI.   Sleep apnea; no compliant with CPAP.   NASH Ammonia level normal.    Estimated body mass index is 34.66 kg/m as calculated from the following:   Height as of this encounter: 5\' 6"  (1.676 m).   Weight as of this encounter: 97.4 kg.   DVT prophylaxis: Heparin Code Status: Full code Family Communication: Discussed with husband at bedside Disposition Plan:  Status is: Inpatient Remains inpatient appropriate because: Management of cellulitis and encephalopathy    Consultants:  Neurology   Procedures:  EEG  Antimicrobials:    Subjective: Patient was little somnolent when seen today.  Easily arousable and able to answer all questions appropriately.  Per patient she did not get a good night sleep due to different changes with BiPAP.  Per patient she is claustrophobic and cannot tolerate a mask which is a struggle for her forearm past many years.  Objective: Vitals:   01/09/23 2009 01/09/23 2019 01/10/23 0203 01/10/23 0808  BP: (!) 129/55  (!) 150/56 (!) 144/60  Pulse: 66  61 62  Resp: 15  16 16   Temp: 98 F (36.7 C)  98.5 F (36.9 C) 98.2 F (36.8 C)  TempSrc: Oral  Oral Oral  SpO2: 100% 98% 100% 100%  Weight:      Height:        Intake/Output Summary (Last  24 hours) at 01/10/2023 0912 Last data filed at 01/09/2023 0940 Gross per 24 hour  Intake 240 ml  Output --  Net 240 ml    Filed Weights   01/03/23 1941 01/07/23 0542  Weight: 94.3 kg 97.4 kg    Examination:  General.  Well-developed lady, in no acute distress. Pulmonary.  Lungs clear bilaterally, normal respiratory effort. CV.  Regular rate and rhythm, no JVD, rub or murmur. Abdomen.  Soft, nontender, nondistended, BS positive. CNS.  Alert and oriented .  No focal neurologic deficit. Extremities.  No edema, no cyanosis, pulses intact and symmetrical. Psychiatry.  Judgment and insight appears normal. .    Data Reviewed: I have personally reviewed following labs and imaging studies  CBC: Recent Labs  Lab 01/03/23 1949 01/05/23 0235 01/06/23 0302 01/07/23 0443 01/08/23 0718 01/09/23 0708  WBC 5.2 5.4 4.8 5.3 5.6 5.7  NEUTROABS 2.6  --   --   --   --   --   HGB 10.7* 10.1* 10.3* 9.6* 9.5* 9.7*  HCT 34.3* 31.7* 31.5* 29.8* 30.0* 30.4*  MCV 92.7 91.6 90.0 91.7 91.7 91.6  PLT 132* 126* 133* 119* 122* 119*    Basic Metabolic  Panel: Recent Labs  Lab 01/05/23 0235 01/06/23 0302 01/07/23 0443 01/08/23 0718 01/09/23 0708  NA 137 138 137 139 138  K 4.0 3.9 4.3 4.3 4.6  CL 98 98 96* 99 96*  CO2 31 30 30 31  33*  GLUCOSE 182* 162* 220* 88 147*  BUN 18 24* 31* 27* 32*  CREATININE 1.26* 1.20* 1.34* 1.12* 1.32*  CALCIUM 8.2* 8.3* 7.9* 7.8* 8.4*    GFR: Estimated Creatinine Clearance: 50 mL/min (A) (by C-G formula based on SCr of 1.32 mg/dL (H)). Liver Function Tests: Recent Labs  Lab 01/03/23 1949 01/09/23 1531  AST 13* 13*  ALT 13 12  ALKPHOS 79 50  BILITOT 0.5 0.2*  PROT 6.2* 5.8*  ALBUMIN 2.4* 2.2*    No results for input(s): "LIPASE", "AMYLASE" in the last 168 hours. Recent Labs  Lab 01/03/23 2200 01/08/23 1457 01/09/23 1532  AMMONIA 26 49* 32    Coagulation Profile: No results for input(s): "INR", "PROTIME" in the last 168 hours. Cardiac  Enzymes: Recent Labs  Lab 01/04/23 0510  CKTOTAL 48    BNP (last 3 results) No results for input(s): "PROBNP" in the last 8760 hours. HbA1C: No results for input(s): "HGBA1C" in the last 72 hours.  CBG: Recent Labs  Lab 01/09/23 1508 01/09/23 1857 01/09/23 2052 01/10/23 0648 01/10/23 0738  GLUCAP 240* 139* 193* 98 88    Lipid Profile: No results for input(s): "CHOL", "HDL", "LDLCALC", "TRIG", "CHOLHDL", "LDLDIRECT" in the last 72 hours. Thyroid Function Tests: No results for input(s): "TSH", "T4TOTAL", "FREET4", "T3FREE", "THYROIDAB" in the last 72 hours.  Anemia Panel: No results for input(s): "VITAMINB12", "FOLATE", "FERRITIN", "TIBC", "IRON", "RETICCTPCT" in the last 72 hours.  Sepsis Labs: No results for input(s): "PROCALCITON", "LATICACIDVEN" in the last 168 hours.  Recent Results (from the past 240 hour(s))  Resp panel by RT-PCR (RSV, Flu A&B, Covid) Anterior Nasal Swab     Status: None   Collection Time: 01/03/23  9:49 PM   Specimen: Anterior Nasal Swab  Result Value Ref Range Status   SARS Coronavirus 2 by RT PCR NEGATIVE NEGATIVE Final    Comment: (NOTE) SARS-CoV-2 target nucleic acids are NOT DETECTED.  The SARS-CoV-2 RNA is generally detectable in upper respiratory specimens during the acute phase of infection. The lowest concentration of SARS-CoV-2 viral copies this assay can detect is 138 copies/mL. A negative result does not preclude SARS-Cov-2 infection and should not be used as the sole basis for treatment or other patient management decisions. A negative result may occur with  improper specimen collection/handling, submission of specimen other than nasopharyngeal swab, presence of viral mutation(s) within the areas targeted by this assay, and inadequate number of viral copies(<138 copies/mL). A negative result must be combined with clinical observations, patient history, and epidemiological information. The expected result is Negative.  Fact  Sheet for Patients:  03/04/23  Fact Sheet for Healthcare Providers:  BloggerCourse.com  This test is no t yet approved or cleared by the [employer] FDA and  has been authorized for detection and/or diagnosis of SARS-CoV-2 by FDA under an Emergency Use Authorization (EUA). This EUA will remain  in effect (meaning this test can be used) for the duration of the COVID-19 declaration under Section 564(b)(1) of the Act, 21 U.S.C.section 360bbb-3(b)(1), unless the authorization is terminated  or revoked sooner.       Influenza A by PCR NEGATIVE NEGATIVE Final   Influenza B by PCR NEGATIVE NEGATIVE Final    Comment: (NOTE) The Xpert Xpress SARS-CoV-2/FLU/RSV  plus assay is intended as an aid in the diagnosis of influenza from Nasopharyngeal swab specimens and should not be used as a sole basis for treatment. Nasal washings and aspirates are unacceptable for Xpert Xpress SARS-CoV-2/FLU/RSV testing.  Fact Sheet for Patients: EntrepreneurPulse.com.au  Fact Sheet for Healthcare Providers: IncredibleEmployment.be  This test is not yet approved or cleared by the [place] FDA and has been authorized for detection and/or diagnosis of SARS-CoV-2 by FDA under an Emergency Use Authorization (EUA). This EUA will remain in effect (meaning this test can be used) for the duration of the COVID-19 declaration under Section 564(b)(1) of the Act, 21 U.S.C. section 360bbb-3(b)(1), unless the authorization is terminated or revoked.     Resp Syncytial Virus by PCR NEGATIVE NEGATIVE Final    Comment: (NOTE) Fact Sheet for Patients: EntrepreneurPulse.com.au  Fact Sheet for Healthcare Providers: IncredibleEmployment.be  This test is not yet approved or cleared by the [place] FDA and has been authorized for detection and/or diagnosis of SARS-CoV-2 by FDA under an  Emergency Use Authorization (EUA). This EUA will remain in effect (meaning this test can be used) for the duration of the COVID-19 declaration under Section 564(b)(1) of the Act, 21 U.S.C. section 360bbb-3(b)(1), unless the authorization is terminated or revoked.  Performed at [facility], [place] [address], [place], [place] [address]   MRSA Next Gen by PCR, Nasal     Status: None   Collection Time: 01/04/23 10:43 AM   Specimen: Nasal Mucosa; Nasal Swab  Result Value Ref Range Status   MRSA by PCR Next Gen NOT DETECTED NOT DETECTED Final    Comment: (NOTE) The GeneXpert MRSA Assay (FDA approved for NASAL specimens only), is one component of a comprehensive MRSA colonization surveillance program. It is not intended to diagnose MRSA infection nor to guide or monitor treatment for MRSA infections. Test performance is not FDA approved in patients less than 40 years old. Performed at [facility], [place] [address] [place], [place] [address]   Urine Culture     Status: None   Collection Time: 01/04/23 11:58 AM   Specimen: Urine, Clean Catch  Result Value Ref Range Status   Specimen Description URINE, CLEAN CATCH  Final   Special Requests NONE  Final   Culture   Final    NO GROWTH Performed at [facility], [place] [address], [place], [place] [address]    Report Status 01/05/2023 FINAL  Final         Radiology Studies: No results found.      Scheduled Meds:  atorvastatin  10 mg Oral Daily   buprenorphine  1 patch Transdermal Q Wed   divalproex  500 mg Oral Q12H   doxycycline  100 mg Oral Q12H   DULoxetine  60 mg Oral Daily   famotidine  40 mg Oral QHS   heparin  5,000 Units Subcutaneous Q8H   insulin aspart  0-15 Units Subcutaneous TID WC   insulin aspart  0-5 Units Subcutaneous QHS   insulin glargine-yfgn  10 Units Subcutaneous Daily   levETIRAcetam  1,000 mg Oral BID   mometasone-formoterol  2 puff Inhalation BID   pantoprazole  40 mg Oral Daily    pramipexole  1 mg Oral QHS   pregabalin  200 mg Oral BID   sodium chloride flush  3 mL Intravenous Q12H   Continuous Infusions:  sodium chloride       LOS: 6 days    Time spent: 39  minutes.   This record has been created using Systems analyst. Errors have been sought and corrected,but may not always be located. Such creation errors do not reflect on the standard of care.   Lorella Nimrod, MD [facility]   If 7PM-7AM, please contact night-coverage [email or web address]  01/10/2023, 9:12 AM

## 2023-01-10 NOTE — Progress Notes (Signed)
LTM maint complete - no skin breakdown under: A1,PZ. Event Button tested.

## 2023-01-10 NOTE — Progress Notes (Signed)
LTM maint complete - no skin breakdown under: Fpz  Serviced leads Impedance below 10kOhms.  [facility] monitored, Event button test confirmed by [facility].

## 2023-01-10 NOTE — Progress Notes (Signed)
RT NOTE:  Pt refuses CPAP tonight. RT from last night attempted but unsuccessful in finding a comfortable setting that the patient could tolerate. Pt currently 100% on RA.

## 2023-01-10 NOTE — Progress Notes (Addendum)
11am: Patient has been offered a bed at [facility]. Shirlean Mylar to initiate insurance authorization once a new PT note is available.  10:10am: CSW faxed patient's clinicals to Shirlean Mylar at [facility] for review.  8:40am: CSW spoke with patient's husband to present him with bed offer from [facility] in [place] states he does not want his wife placed there but instead wants [facility].  CSW will contact facility to determine if the patient can be offered a bed.  Madilyn Fireman, MSW, LCSW Transitions of Care  Clinical Social Worker II [phone]

## 2023-01-10 NOTE — Progress Notes (Signed)
Occupational Therapy Treatment Patient Details Name: Brenda Morgan MRN: [id number] DOB: [birth year] Today's Date: 01/10/2023   History of present illness This is a 67 year old female admitted 01/03/23 with AMS and encephalopathy.  Pt with PMH seizures, HFpEF (2018), DM2, HTN, COPD, CKD, PVD, OSA no CPAP, chronic pain, fibromyalgia on opioids, NASH, past EtOH abuse, unintentional tylenol OD (2012), .   OT comments  Patient received in supine and eager to participate with OT treatment. Patient stood from EOB with posterior leaning and feet sliding. SPT performed for safety to recliner. Patient progressing with self care requiring cues for sequencing and setup for UB dressing and mod assist for LB dressing due to assistance needed to pull up underwear while standing. Patient able to return to EOB with mod assist to stand and min assist for transfer with RW with decreased posterior leaning with tactile and verbal cues. Patient discharge recommendations changed to SNF due to level of assistance needed for transfers and self care tasks. Acute OT to continue to follow.    Recommendations for follow up therapy are one component of a multi-disciplinary discharge planning process, led by the attending physician.  Recommendations may be updated based on patient status, additional functional criteria and insurance authorization.    Follow Up Recommendations  Skilled nursing-short term rehab (<3 hours/day)     Assistance Recommended at Discharge Frequent or constant Supervision/Assistance  Patient can return home with the following  A little help with bathing/dressing/bathroom;A little help with walking and/or transfers;Help with stairs or ramp for entrance   Equipment Recommendations  None recommended by OT    Recommendations for Other Services      Precautions / Restrictions Precautions Precautions: Fall Precaution Comments: orthostatic blood pressure Restrictions Weight Bearing Restrictions: No        Mobility Bed Mobility Overal bed mobility: Needs Assistance Bed Mobility: Supine to Sit, Sit to Supine     Supine to sit: Min guard Sit to supine: Min assist   General bed mobility comments: HOB up, increased time, assistance with BLEs to return to supine    Transfers Overall transfer level: Needs assistance Equipment used: Rolling walker (2 wheels) Transfers: Sit to/from Stand, Bed to chair/wheelchair/BSC Sit to Stand: Mod assist     Step pivot transfers: Min assist     General transfer comment: min assist for balance with transfers due to posterior leaning     Balance Overall balance assessment: Needs assistance Sitting-balance support: Bilateral upper extremity supported, Single extremity supported Sitting balance-Leahy Scale: Poor Sitting balance - Comments: able to maintain sitting balance with one extremity support Postural control: Posterior lean Standing balance support: Bilateral upper extremity supported Standing balance-Leahy Scale: Poor Standing balance comment: reliant on UE support for balance due to posterior leaning                           ADL either performed or assessed with clinical judgement   ADL Overall ADL's : Needs assistance/impaired     Grooming: Sitting;Set up;Wash/dry face;Wash/dry hands Grooming Details (indicate cue type and reason): seated on EOB         Upper Body Dressing : Set up;Sitting Upper Body Dressing Details (indicate cue type and reason): change gown Lower Body Dressing: Moderate assistance;Sit to/from stand Lower Body Dressing Details (indicate cue type and reason): patient able to thread legs into clothing but required assistance to pull up underwear due to posterior leaning  General ADL Comments: limited by posterior leaning and problem solving    Extremity/Trunk Assessment              Vision       Perception     Praxis      Cognition Arousal/Alertness:  Awake/alert Behavior During Therapy: WFL for tasks assessed/performed Overall Cognitive Status: Impaired/Different from baseline Area of Impairment: Problem solving                             Problem Solving: Slow processing General Comments: able to give correct month and year and place but difficulty with problem solving and increased time to follow directions        Exercises      Shoulder Instructions       General Comments      Pertinent Vitals/ Pain       Pain Assessment Pain Assessment: Faces Faces Pain Scale: No hurt Pain Intervention(s): Monitored during session  Home Living                                          Prior Functioning/Environment              Frequency  Min 2X/week        Progress Toward Goals  OT Goals(current goals can now be found in the care plan section)  Progress towards OT goals: Progressing toward goals  Acute Rehab OT Goals Patient Stated Goal: get better OT Goal Formulation: With patient Time For Goal Achievement: 01/19/23 Potential to Achieve Goals: Good ADL Goals Pt Will Perform Grooming: with min guard assist;with supervision;standing Pt Will Perform Lower Body Bathing: with min guard assist;with supervision Pt Will Perform Lower Body Dressing: with min guard assist;with supervision Pt Will Transfer to Toilet: with min guard assist;with supervision Pt Will Perform Toileting - Clothing Manipulation and hygiene: with min guard assist;with supervision Pt Will Perform Tub/Shower Transfer: with min assist;with min guard assist;ambulating;rolling walker;shower seat  Plan Discharge plan remains appropriate    Co-evaluation                 AM-PAC OT "6 Clicks" Daily Activity     Outcome Measure   Help from another person eating meals?: None Help from another person taking care of personal grooming?: A Little Help from another person toileting, which includes using toliet, bedpan, or  urinal?: A Little Help from another person bathing (including washing, rinsing, drying)?: A Little Help from another person to put on and taking off regular upper body clothing?: A Little Help from another person to put on and taking off regular lower body clothing?: A Lot 6 Click Score: 18    End of Session Equipment Utilized During Treatment: Rolling walker (2 wheels);Gait belt  OT Visit Diagnosis: Unsteadiness on feet (R26.81);Other abnormalities of gait and mobility (R26.89);Repeated falls (R29.6);Muscle weakness (generalized) (M62.81);Pain   Activity Tolerance Patient tolerated treatment well   Patient Left in bed;with call bell/phone within reach;with bed alarm set   Nurse Communication Mobility status        Time: 3016-0109 OT Time Calculation (min): 33 min  Charges: OT General Charges $OT Visit: 1 Visit OT Treatments $Self Care/Home Management : 23-37 mins  Lodema Hong, [place]  Office G. L. Garcia 01/10/2023, 2:59 PM

## 2023-01-10 NOTE — Progress Notes (Signed)
PT Cancellation Note  Patient Details Name: Brenda Morgan MRN: [id number] DOB: 13-Apr-1956   Cancelled Treatment:    Reason Eval/Treat Not Completed: Patient at procedure or test/unavailable (Pt on continuous EEG and to let therapy know when discontinued so PT and OT can continue.)   [name] 01/10/2023, 9:56 AM [name],PT Acute Rehab Services [phone]

## 2023-01-10 NOTE — Procedures (Addendum)
Patient Name: Brenda Morgan  MRN: [id number]  Epilepsy Attending: Lora Havens  Referring Physician/Provider: Greta Doom, MD  Duration: 01/09/2023 1434 to 01/10/2023 1342   Patient history: 67yo F with ams. EEG to evaluate for seizure   Level of alertness: Awake, asleep   AEDs during EEG study: LEV, PGB, VPA   Technical aspects: This EEG study was done with scalp electrodes positioned according to the 10-20 International system of electrode placement. Electrical activity was reviewed with band pass filter of 1-70Hz , sensitivity of 7 uV/mm, display speed of 59mm/sec with a 60Hz  notched filter applied as appropriate. EEG data were recorded continuously and digitally stored.  Video monitoring was available and reviewed as appropriate.   Description: The posterior dominant rhythm consists of 8 Hz activity of moderate voltage (25-35 uV) seen predominantly in posterior head regions, symmetric and reactive to eye opening and eye closing.  Sleep was characterized by vertex waves, sleep spindles (12 to 14 Hz), maximal frontocentral region.  EEG showed intermittent generalized 3 to 5Hz  theta-delta slowing. Hyperventilation and photic stimulation were not performed.     ABNORMALITY - Intermittent slow, generalized   IMPRESSION: This study is w suggestive of mild diffuse encephalopathy, nonspecific etiology.  No seizures or epileptiform discharges were seen throughout the recording.   Please note that lack of epileptiform activity during interictal EEG does not exclude the diagnosis of epilepsy.   Brenda Morgan [name]

## 2023-01-10 NOTE — Progress Notes (Signed)
LTM EEG discontinued - no skin breakdown at unhook.  

## 2023-01-10 NOTE — Progress Notes (Signed)
Mobility Specialist Progress Note   01/10/23 1545  Mobility  Activity Ambulated with assistance in room;Stood at bedside;Dangled on edge of bed (STS x3.)  Level of Assistance Moderate assist, patient does 50-74%  Assistive Device Front wheel walker  Distance Ambulated (ft) 5 ft  Range of Motion/Exercises Active;All extremities  Activity Response Tolerated well   Patient received in supine, sleep and easily aroused. Agreeable to participate. Presented A&Ox4 but slightly lethargic. Was independent for bed mobility but required min A for static sitting secondary to posterior lean. Completed STS x3, marching in place and taking minimal steps with mod A and cues for hand placement. Did not attempt ambulation away from bed due to mild unsteadiness and backward disequilibrium. Was able to march in place and take side steps along side with cues for sequencing steps with RW.  Tolerated without incident but complained of dizziness that dissipated with seated rest. Was left in supine all needs met, call bell in reach.   Brenda Morgan, BS EXP Mobility Specialist Please contact via SecureChat or Rehab office at [phone]

## 2023-01-11 DIAGNOSIS — K7581 Nonalcoholic steatohepatitis (NASH): Secondary | ICD-10-CM | POA: Insufficient documentation

## 2023-01-11 DIAGNOSIS — I5022 Chronic systolic (congestive) heart failure: Secondary | ICD-10-CM | POA: Insufficient documentation

## 2023-01-11 DIAGNOSIS — R410 Disorientation, unspecified: Secondary | ICD-10-CM | POA: Diagnosis not present

## 2023-01-11 LAB — GLUCOSE, CAPILLARY: Glucose-Capillary: 90 mg/dL (ref 70–99)

## 2023-01-11 MED ORDER — LACTULOSE 10 GM/15ML PO SOLN: 10.0000 g | Freq: Two times a day (BID) | ORAL | 0 refills | Status: AC

## 2023-01-11 MED ORDER — PREGABALIN 100 MG PO CAPS: 100.0000 mg | ORAL_CAPSULE | Freq: Three times a day (TID) | ORAL | Status: AC

## 2023-01-11 NOTE — Progress Notes (Signed)
CSW spoke with Brenda Morgan at [facility] and [facility] who states the patient's insurance authorization has been approved.  The number to call for report is [phone], option [phone] for the receptionist. Patient will go to room 411. Patient's husband will transport her to the facility.  Madilyn Fireman, MSW, LCSW Transitions of Care  Clinical Social Worker II [phone]

## 2023-01-11 NOTE — Plan of Care (Signed)

## 2023-01-11 NOTE — Progress Notes (Addendum)
Mobility Specialist Progress Note:   01/11/23 0957  Mobility  Activity Transferred to/from [facility]  Level of Assistance Contact guard assist, steadying assist  Assistive Device Front wheel walker  Activity Response Tolerated well  Mobility Referral Yes  $Mobility charge 1 Mobility   Pt received in bed and requesting assistance to [facility]. No complaints. Independent with pericare. Pt left in bed with all needs met, call bell in reach, and bed alarm on.   Andrey Campanile Mobility Specialist Please contact via SecureChat or  Rehab office at [phone]

## 2023-01-11 NOTE — Discharge Summary (Signed)
DISCHARGE SUMMARY  Brenda Morgan  MR#: [id number]  DOB:05-11-1956  Date of Admission: 01/03/2023 Date of Discharge: 01/11/2023  Attending Physician:Brenda Morgan Silvestre Gunner, MD  Patient's NFA:[id number], Brenda Bossier, NP  Consults: Neurology   Disposition: D/C to SNF for rehab stay   Follow-up Appts:  Follow-up Information     Crumpton, Brenda Bossier, NP Follow up.   Specialty: Nurse Practitioner Contact information: [address] [place] [address] [phone]                 Tests Needing Follow-up: -outpatient follow up and evaluation of proteinuria will be needed  -assess volume status with lasix being changed to "prn swelling"  -avoid addition of sedating medications as pt prone to oversedation due to polypharmacy   Discharge Diagnoses: Delirium Acute metabolic encephalopathy due to polypharmacy, hypoxia, and cellulitis Bilateral lower extremity cellulitis R > L Seizure disorder Frequent falls/deconditioning Acute exacerbation of chronic diastolic CHF COPD HTN Chronic pain DM2 OHS/OSA NASH Morbid obesity - Body mass index is 34.66 kg/m. Persistent proteinuria  Initial presentation: 67 year old with a history of seizure, diastolic CHF, DM2, HTN, COPD, PVD, CKD, OSA not on CPAP, fibromyalgia/chronic pain, NASH, and prior alcohol abuse who presented to the ER 01/03/2023 with altered mental status and an unsteady gait.   Hospital Course: Following her admission she continued to have episodes in which she would stare and exhibit urinary incontinence with confusion following. Neurology evaluated and an EEG was unrevealing. Continuous EEG was also accomplished and revealed no seizure activity. There was concern that polypharmacy was contributing and her medication regimen was greatly narrowed. Further workup has noted pCO2 of 61 and an O2 of 67 during an episode of confusion and somnolence. This was felt to be related to noncompliance with  BiPAP chronically.   Delirium -acute metabolic encephalopathy due to polypharmacy, hypoxia, and cellulitis Neurology evaluated the patient and seizure was ruled out - ABG did note hypoxia and hypercarbia as noted above, but patient was not able to tolerate BiPAP - medication regimen has been narrowed as much as possible - B12 504 and TSH normal - RPR negative - ammonia normal - CT head with no acute findings - mental status has improved with the interventions noted here    Bilateral lower extremity cellulitis R > L Antibiotic course has been completed as of 01/09/2023   Seizure disorder Continue seizure medications as per Neurology recommendations -continuous EEG without evidence of epileptiform activity   Frequent falls/deconditioning Not orthostatic - PT/OT recommended SNF rehab which was arranged    Acute exacerbation of chronic diastolic CHF Lasix as needed - prone to dizziness with aggressive diuresis   COPD Continue maintenance inhaled medications - well compensated at time of dc   HTN Blood pressure reasonably controlled at time of d/c    Chronic pain Continue Cymbalta and Lyrica at decreased doses - continue buprenorphine patch for now   DM2 Covered with SSI and long-acting insulin - CBG well-controlled   OHS/OSA Noncompliant with CPAP - unable to tolerate facemask due to claustrophobia - no alternatives available as inpatient - consider trials of nasal masks or pillows as outpt    NASH Ammonia level not significantly elevated   Morbid obesity - Body mass index is 34.66 kg/m.   Persistent proteinuria Noted on urinalysis dating back to at least May 2023    Allergies as of 01/11/2023       Reactions   Amlodipine Anaphylaxis, Hives, Swelling, Other (See Comments)   Tongue swelling  Naltrexone Hives, Shortness Of Breath, Swelling   Penicillins Hives, Shortness Of Breath, Swelling, Other (See Comments)   Tolerated ceftriaxone and cefepime 06/2017 Hives         Medication List     STOP taking these medications    carvedilol 25 MG tablet Commonly known as: COREG   cetirizine 10 MG tablet Commonly known as: ZYRTEC   hydrOXYzine 25 MG tablet Commonly known as: ATARAX   lamoTRIgine 200 MG Tbdp   losartan 25 MG tablet Commonly known as: COZAAR   pioglitazone 15 MG tablet Commonly known as: ACTOS   spironolactone 50 MG tablet Commonly known as: ALDACTONE   traZODone 100 MG tablet Commonly known as: DESYREL       TAKE these medications    atorvastatin 10 MG tablet Commonly known as: LIPITOR Take 10 mg by mouth daily.   azelastine 0.1 % nasal spray Commonly known as: ASTELIN Place 2 sprays into both nostrils as needed for rhinitis (congestion).   buprenorphine 15 MCG/HR Commonly known as: BUTRANS Place 1 patch onto the skin once a week.   diclofenac Sodium 1 % Gel Commonly known as: VOLTAREN Apply 4 g topically 4 (four) times daily. What changed:  how much to take when to take this reasons to take this   divalproex 500 MG DR tablet Commonly known as: DEPAKOTE Take 500 mg by mouth 2 (two) times daily.   DULoxetine 60 MG capsule Commonly known as: CYMBALTA Take 60 mg by mouth daily.   famotidine 40 MG tablet Commonly known as: PEPCID Take 40 mg by mouth daily.   Fluticasone-Salmeterol 250-50 MCG/DOSE Aepb Commonly known as: ADVAIR Inhale 1 puff into the lungs 2 (two) times daily as needed (shortness of breath/wheezing).   furosemide 40 MG tablet Commonly known as: LASIX Take 1 tablet (40 mg total) by mouth daily as needed. What changed:  when to take this reasons to take this   icosapent Ethyl 1 g capsule Commonly known as: VASCEPA Take 2 g by mouth 2 (two) times daily.   lactulose 10 GM/15ML solution Commonly known as: CHRONULAC Take 15 mLs (10 g total) by mouth 2 (two) times daily. What changed: how much to take   levETIRAcetam 1000 MG tablet Commonly known as: KEPPRA Take 1,000 mg by mouth 2  (two) times daily.   lubiprostone 24 MCG capsule Commonly known as: AMITIZA Take 24 mcg by mouth daily.   metolazone 5 MG tablet Commonly known as: ZAROXOLYN Take 5 mg by mouth 3 (three) times a week. Sunday,Wednesday,friday   Ozempic (1 MG/DOSE) 4 MG/3ML Sopn Generic drug: Semaglutide (1 MG/DOSE) Inject 1 mg into the skin once a week. Take on Sunday   pantoprazole 40 MG tablet Commonly known as: PROTONIX Take 40 mg by mouth daily.   potassium chloride SA 20 MEQ tablet Commonly known as: KLOR-CON M Take 1 tablet (20 mEq total) by mouth daily as needed. Take potassium  when you take lasix tablet. . What changed:  when to take this reasons to take this additional instructions   pramipexole 0.5 MG tablet Commonly known as: MIRAPEX Take 1 mg by mouth at bedtime.   pregabalin 100 MG capsule Commonly known as: LYRICA Take 1 capsule (100 mg total) by mouth 3 (three) times daily. What changed:  medication strength how much to take when to take this   Synjardy XR 25-1000 MG Tb24 Generic drug: Empagliflozin-metFORMIN HCl ER Take 1 tablet by mouth daily.  Durable Medical Equipment  (From admission, onward)           Start     Ordered   01/07/23 0855  For home use only DME oxygen  Once       Question Answer Comment  Length of Need 6 Months   Mode or (Route) Nasal cannula   Liters per Minute 2   Oxygen delivery system Gas      01/07/23 0854            Day of Discharge BP (!) 141/76 (BP Location: Right Arm)   Pulse (!) 59   Temp 97.6 F (36.4 C) (Oral)   Resp 16   Ht 5\' 6"  (1.676 m)   Wt 97.4 kg   SpO2 (!) 89%   BMI 34.66 kg/m   Physical Exam: General: No acute respiratory distress Lungs: Clear to auscultation bilaterally without wheezes or crackles Cardiovascular: Regular rate and rhythm without murmur gallop or rub normal S1 and S2 Abdomen: Nontender, nondistended, soft, bowel sounds positive, no rebound, no ascites, no  appreciable mass Extremities: No significant cyanosis, clubbing, or edema bilateral lower extremities  Basic Metabolic Panel: Recent Labs  Lab 01/05/23 0235 01/06/23 0302 01/07/23 0443 01/08/23 0718 01/09/23 0708  NA 137 138 137 139 138  K 4.0 3.9 4.3 4.3 4.6  CL 98 98 96* 99 96*  CO2 31 30 30 31  33*  GLUCOSE 182* 162* 220* 88 147*  BUN 18 24* 31* 27* 32*  CREATININE 1.26* 1.20* 1.34* 1.12* 1.32*  CALCIUM 8.2* 8.3* 7.9* 7.8* 8.4*   CBC: Recent Labs  Lab 01/05/23 0235 01/06/23 0302 01/07/23 0443 01/08/23 0718 01/09/23 0708  WBC 5.4 4.8 5.3 5.6 5.7  HGB 10.1* 10.3* 9.6* 9.5* 9.7*  HCT 31.7* 31.5* 29.8* 30.0* 30.4*  MCV 91.6 90.0 91.7 91.7 91.6  PLT 126* 133* 119* 122* 119*    Time spent in discharge (includes decision making & examination of pt): 35 minutes  01/11/2023, 10:48 AM   Cherene Altes, MD [facility] Office  [phone]

## 2023-01-11 NOTE — Progress Notes (Signed)
Attempting to call report for patient, placed on hold numerous times with no answer. Number left on answering machine for [facility] to call back. Husband to transport patient to facility. Pt safely placed inside husbands vehicle with belongings and portable oxygen tank.

## 2023-01-17 ENCOUNTER — Ambulatory Visit: Payer: BC Managed Care – PPO | Admitting: Neurology

## 2023-02-24 DIAGNOSIS — Z9181 History of falling: Secondary | ICD-10-CM | POA: Insufficient documentation

## 2023-03-27 DIAGNOSIS — D61818 Other pancytopenia: Secondary | ICD-10-CM | POA: Insufficient documentation

## 2023-03-27 DIAGNOSIS — J188 Other pneumonia, unspecified organism: Secondary | ICD-10-CM | POA: Insufficient documentation

## 2023-03-27 DIAGNOSIS — F32A Depression, unspecified: Secondary | ICD-10-CM | POA: Insufficient documentation

## 2023-03-27 HISTORY — DX: Other pancytopenia: D61.818

## 2023-03-28 DIAGNOSIS — R41841 Cognitive communication deficit: Secondary | ICD-10-CM | POA: Insufficient documentation

## 2023-03-28 DIAGNOSIS — R2689 Other abnormalities of gait and mobility: Secondary | ICD-10-CM | POA: Insufficient documentation

## 2023-03-28 DIAGNOSIS — R1312 Dysphagia, oropharyngeal phase: Secondary | ICD-10-CM | POA: Insufficient documentation

## 2023-03-28 DIAGNOSIS — E441 Mild protein-calorie malnutrition: Secondary | ICD-10-CM | POA: Insufficient documentation

## 2023-03-28 DIAGNOSIS — M6281 Muscle weakness (generalized): Secondary | ICD-10-CM | POA: Insufficient documentation

## 2023-05-02 ENCOUNTER — Encounter: Payer: Self-pay | Admitting: Neurology

## 2023-05-02 DIAGNOSIS — J209 Acute bronchitis, unspecified: Secondary | ICD-10-CM | POA: Insufficient documentation

## 2023-05-02 DIAGNOSIS — K7200 Acute and subacute hepatic failure without coma: Secondary | ICD-10-CM | POA: Insufficient documentation

## 2023-05-02 DIAGNOSIS — E876 Hypokalemia: Secondary | ICD-10-CM | POA: Diagnosis present

## 2023-05-02 DIAGNOSIS — Z8701 Personal history of pneumonia (recurrent): Secondary | ICD-10-CM | POA: Insufficient documentation

## 2023-05-02 DIAGNOSIS — E611 Iron deficiency: Secondary | ICD-10-CM | POA: Insufficient documentation

## 2023-05-02 DIAGNOSIS — E559 Vitamin D deficiency, unspecified: Secondary | ICD-10-CM | POA: Insufficient documentation

## 2023-05-02 DIAGNOSIS — G4700 Insomnia, unspecified: Secondary | ICD-10-CM | POA: Insufficient documentation

## 2023-05-02 DIAGNOSIS — M62838 Other muscle spasm: Secondary | ICD-10-CM | POA: Insufficient documentation

## 2023-05-02 DIAGNOSIS — K5900 Constipation, unspecified: Secondary | ICD-10-CM | POA: Insufficient documentation

## 2023-05-02 DIAGNOSIS — R790 Abnormal level of blood mineral: Secondary | ICD-10-CM | POA: Insufficient documentation

## 2023-05-02 DIAGNOSIS — I509 Heart failure, unspecified: Secondary | ICD-10-CM | POA: Insufficient documentation

## 2023-05-02 DIAGNOSIS — Z9181 History of falling: Secondary | ICD-10-CM | POA: Insufficient documentation

## 2023-05-02 DIAGNOSIS — K921 Melena: Secondary | ICD-10-CM | POA: Insufficient documentation

## 2023-05-02 DIAGNOSIS — S22080A Wedge compression fracture of T11-T12 vertebra, initial encounter for closed fracture: Secondary | ICD-10-CM | POA: Insufficient documentation

## 2023-05-06 ENCOUNTER — Ambulatory Visit (INDEPENDENT_AMBULATORY_CARE_PROVIDER_SITE_OTHER): Payer: BC Managed Care – PPO | Admitting: Neurology

## 2023-05-06 ENCOUNTER — Encounter: Payer: Self-pay | Admitting: Neurology

## 2023-05-06 VITALS — BP 138/79 | HR 69 | Ht 66.0 in | Wt 212.0 lb

## 2023-05-06 DIAGNOSIS — F05 Delirium due to known physiological condition: Secondary | ICD-10-CM

## 2023-05-06 DIAGNOSIS — Z5181 Encounter for therapeutic drug level monitoring: Secondary | ICD-10-CM | POA: Diagnosis not present

## 2023-05-06 DIAGNOSIS — G40909 Epilepsy, unspecified, not intractable, without status epilepticus: Secondary | ICD-10-CM | POA: Diagnosis not present

## 2023-05-06 DIAGNOSIS — G9340 Encephalopathy, unspecified: Secondary | ICD-10-CM

## 2023-05-06 DIAGNOSIS — R5381 Other malaise: Secondary | ICD-10-CM

## 2023-05-06 MED ORDER — QUETIAPINE FUMARATE 25 MG PO TABS: 12.5000 mg | ORAL_TABLET | Freq: Every day | ORAL | 0 refills | Status: DC

## 2023-05-06 NOTE — Progress Notes (Signed)
[facility]  PATIENT: Brenda Morgan DOB: [birth year]  REFERRING CLINICIAN: Sharion Balloon Pru* HISTORY FROM: Patient and husband  REASON FOR VISIT: New onset Seizure    HISTORICAL  CHIEF COMPLAINT:  Chief Complaint  Patient presents with   Follow-up    Rm 14. With husband, patient reports being in nursing home for three months, and now having problems as the sun is going down with confusion. Husband also says she will gets hostile when she is confused. Reports 2 major seizures in december   INTERVAL HISTORY 05/06/2023:  Patient presents today for follow up. She is accompanied today by husband. Last visit was in July of last year, at that time plan was to continue Lamotrigine 200 mg nightly as she has self discontinue the Levetiracetam.  Husband reports that in December, she had 2 seizures, Keppra restarted and depakote added. She was sent home, then start falling again, felt like leg was giving out. Admitted to rehab and had an UTI, sent back to the hospital. Had EEG, MRI, no acute findings. Discharged to rehab and had pneumonia, had to be admitted back to the hospital. Discharge back to rehab and over there started on Lactulose due to high ammonia. She was in rehab until March when she was sent home.  She has been doing ok at home, no seizures, she is getting therapy once a week, but husband reports irritability, anxiety, in the evening, something like sundowning. She is on Depakote of Levetiracetam, Lamotrigine discontinued. Husband reports no seizures since December but there is deconditioning.     Hospital course and summary 01/11/2023 67 year old with a history of seizure, diastolic CHF, DM2, HTN, COPD, PVD, CKD, OSA not on CPAP, fibromyalgia/chronic pain, NASH, and prior alcohol abuse who presented to the ER 01/03/2023 with altered mental status and an unsteady gait.  Following her admission she continued to have episodes in which she would stare and exhibit  urinary incontinence with confusion following. Neurology evaluated and an EEG was unrevealing. Continuous EEG was also accomplished and revealed no seizure activity. There was concern that polypharmacy was contributing and her medication regimen was greatly narrowed. Further workup has noted pCO2 of 61 and an O2 of 67 during an episode of confusion and somnolence. This was felt to be related to noncompliance with BiPAP chronically.    Delirium -acute metabolic encephalopathy due to polypharmacy, hypoxia, and cellulitis Neurology evaluated the patient and seizure was ruled out - ABG did note hypoxia and hypercarbia as noted above, but patient was not able to tolerate BiPAP - medication regimen has been narrowed as much as possible - B12 504 and TSH normal - RPR negative - ammonia normal - CT head with no acute findings - mental status has improved with the interventions noted here    Bilateral lower extremity cellulitis R > L Antibiotic course has been completed as of 01/09/2023   Seizure disorder Continue seizure medications as per Neurology recommendations -continuous EEG without evidence of epileptiform activity   Frequent falls/deconditioning Not orthostatic - PT/OT recommended SNF rehab which was arranged      INTERVAL HISTORY 07/17/22 Patient presents today for follow-up, last visit was in January, at that time plan was to continue with Keppra and to proceed with her knee surgery.  Since then she had her knee surgery done but had multiple visits to the hospital due to recurrent falls.  Work-up so far has been unrevealing and fall etiology is unclear possibly multifactorial.  In terms of her seizures she  denies any seizure-like activity, actually she discontinued the Keppra because she did not feel like she was having seizures.  She is on lamotrigine 175 mg nightly and she is using it for mood.  Her last fall per husband was yesterday and this is due to her patient losing her balance.  She has  gait instability, uses a walker, on top of that she is on multiple medications for chronic pain including 600 mg of Lyrica, oxycodone, fentanyl patch and a spinal stimulator.   INTERVAL HISTORY 12/28/2021: Patient present today for follow-up with husband, last visit was on October 5, at that time plan was to continue Keppra 500 mg twice a day and recheck a level.  The Keppra level came back normal, 23.3.  Brain MRI was completed and showed no acute intracranial abnormality.  She denies any seizures since last visit and report compliance with medication.  She still pending left knee surgery scheduled for at the end of the month, currently state that she is in a lot of pain.  She is also on a lot of pain medication, does complain of daytime somnolence and occasional headaches, but no seizures.  She is currently not driving.   HISTORY OF PRESENT ILLNESS:  This is a 67 year old woman with extended medical history including fibromyalgia, chronic pain, recurrent UTIs, CKD, chronic liver cirrhosis, hyperlipidemia, hypertension, CHF, obstructive sleep apnea on BiPAP, GERD, prediabetes who is presenting with seizure-like activity 2 weeks ago.  Per husband patient was laying on the bed then he had a growling noise, he went to check on her and she was jerking her head back-and-forth, eyes rolled back, not breathing and foam coming out of the mouth.  Husband said that he put his fingers in patient mouth to put back her tongue.  He called EMS when EMS arrived patient had a second event very similar with head-bobbing, jerking involving the head and arms, episode lasting more than 2 minutes.  He was taken to the local hospital where she had a head CT which was negative and a routine EEG who was reported to be normal. She was also found to be have a lactic acid up to 5.1.She was started on 500 mg twice daily Keppra.  She was in the hospital for a total of 3 days. Husband states that she was confused in the first 2 days of  admission.  Since being discharged from the hospital, patient does not have any seizure-like activity.  She denies any previous history of seizures, denies any family history of seizures, denies any history of head trauma and denies overall any seizure risk factor.  She is on lamotrigine for mood.    Handedness: Right   Seizure Type: Generalized   Current frequency: First time seizure  Any injuries from seizures: None   Seizure risk factors: None reported, no strokes,no head trauma   Previous ASMs: Levetiracetam, Lamotrigine    Currenty ASMs: Levetiracetam, Valproic acid   ASMs side effects: None   Brain Images: CT head normal   Previous EEGs: No abnormalities    OTHER MEDICAL CONDITIONS: Fibromyalgia, chronic pain, recurrent UTIs, CKD, chronic liver cirrhosis, hyperlipidemia, hypertension, CHF, obstructive sleep apnea on BiPAP, GERD, prediabetes  REVIEW OF SYSTEMS: Full 14 system review of systems performed and negative with exception of: as noted in the HPI.   ALLERGIES: Allergies  Allergen Reactions   Amlodipine Anaphylaxis, Hives, Swelling and Other (See Comments)    Tongue swelling    Naltrexone Hives, Shortness Of Breath and Swelling  Penicillins Hives, Shortness Of Breath, Swelling and Other (See Comments)    Tolerated ceftriaxone and cefepime 06/2017 Hives      HOME MEDICATIONS: Outpatient Medications Prior to Visit  Medication Sig Dispense Refill   atorvastatin (LIPITOR) 10 MG tablet Take 10 mg by mouth daily.     azelastine (ASTELIN) 0.1 % nasal spray Place 2 sprays into both nostrils as needed for rhinitis (congestion).     buprenorphine (BUTRANS) 15 MCG/HR Place 1 patch onto the skin once a week.     divalproex (DEPAKOTE) 500 MG DR tablet Take 500 mg by mouth 2 (two) times daily.     DULoxetine (CYMBALTA) 60 MG capsule Take 60 mg by mouth daily.     famotidine (PEPCID) 40 MG tablet Take 40 mg by mouth daily.     Fluticasone-Salmeterol (ADVAIR) 250-50  MCG/DOSE AEPB Inhale 1 puff into the lungs 2 (two) times daily as needed (shortness of breath/wheezing).      furosemide (LASIX) 40 MG tablet Take 1 tablet (40 mg total) by mouth daily as needed. 30 tablet 0   icosapent Ethyl (VASCEPA) 1 g capsule Take 2 g by mouth 2 (two) times daily.     lactulose (CHRONULAC) 10 GM/15ML solution Take 15 mLs (10 g total) by mouth 2 (two) times daily. 236 mL 0   levETIRAcetam (KEPPRA) 1000 MG tablet Take 1,000 mg by mouth 2 (two) times daily.     lubiprostone (AMITIZA) 24 MCG capsule Take 24 mcg by mouth daily.     OZEMPIC, 1 MG/DOSE, 4 MG/3ML SOPN Inject 1 mg into the skin once a week. Take on Sunday     pantoprazole (PROTONIX) 40 MG tablet Take 40 mg by mouth daily.     potassium chloride SA (KLOR-CON M) 20 MEQ tablet Take 1 tablet (20 mEq total) by mouth daily as needed. Take potassium  when you take lasix tablet. . 30 tablet 0   pramipexole (MIRAPEX) 0.5 MG tablet Take 1 mg by mouth at bedtime.     pregabalin (LYRICA) 100 MG capsule Take 1 capsule (100 mg total) by mouth 3 (three) times daily.     diclofenac Sodium (VOLTAREN) 1 % GEL Apply 4 g topically 4 (four) times daily. (Patient taking differently: Apply 1 Application topically as needed (pain).) 100 g 0   Empagliflozin-metFORMIN HCl ER (SYNJARDY XR) 25-1000 MG TB24 Take 1 tablet by mouth daily.     metolazone (ZAROXOLYN) 5 MG tablet Take 5 mg by mouth 3 (three) times a week. Sunday,Wednesday,friday     No facility-administered medications prior to visit.    PAST MEDICAL HISTORY: Past Medical History:  Diagnosis Date   Acute metabolic encephalopathy    Acute respiratory failure with hypoxia and hypercapnia (HCC)    AKI (acute kidney injury) (HCC) 09/15/2020   Altered mental status    Anxiety    Arthritis    Cerebral atrophy, Head CT (HCC) 09/23/2020   CHF (congestive heart failure) (HCC)    Chronic pain 04/29/2022   COPD (chronic obstructive pulmonary disease) (HCC)    Diabetes mellitus without  complication (HCC)    DM II (diabetes mellitus, type II), controlled (HCC) 08/01/2020   Fatty liver 2012   per pt 06/24/20   GERD (gastroesophageal reflux disease)    Heart murmur    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 Patient  hospitalized in December 18-22, 2014 at [facility] for hypercapnia and hepatic encephalopathy.    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/2020   Lumbar radiculopathy 07/06/2020   Obesity (BMI 30.0-34.9) 04/29/2022   Other pancytopenia (HCC) 03/27/2023   Peripheral vascular disease (HCC)    Pneumonia due to COVID-19 virus    Postoperative wound infection 07/31/2020   S/P total knee arthroplasty, left 01/16/2022   Seizure disorder (HCC) 04/29/2022   Seizures (HCC)    Shock (HCC)    Sleep apnea    Spinal cord stimulator present    Toxic encephalopathy 04/28/2022    PAST SURGICAL HISTORY: Past Surgical History:  Procedure Laterality Date   BACK SURGERY     carotid artery surgery Right 10/07/2012   carotid ultrasound  06/2015   CHOLECYSTECTOMY  1981   COLONOSCOPY  2014   DIAGNOSTIC MAMMOGRAM  03/2017   groin lypnoid Left 03/2015   LUMBAR WOUND DEBRIDEMENT N/A 08/02/2020   Procedure: IRRIGATE AND DEBRIDEMENT OF LUMBAR WOUND, PLACEMENT OF WOUND VAC;  Surgeon: Dawley, Alan Mulder, DO;  Location: [facility] OR;  Service: Neurosurgery;  Laterality: N/A;   right heel spur  1994   SPINAL CORD STIMULATOR INSERTION N/A 05/03/2021   Procedure: Spinal Cord Stimulator Placement;  Surgeon: Renaldo Fiddler, MD;  Location[facility] OR;  Service: Neurosurgery;  Laterality: N/A;   TOTAL KNEE ARTHROPLASTY Left 01/16/2022   Procedure: TOTAL KNEE  ARTHROPLASTY;  Surgeon: Sheral Apley, MD;  Location: [facility] ORS;  Service: Orthopedics;  Laterality: Left;   TUBAL LIGATION  1985   wrist trigger release Right 01/2016    FAMILY HISTORY: Family History  Problem Relation Age of Onset   Diabetes Mother    Heart disease Mother    Stomach cancer Mother    Diabetes Father    Heart disease Father    Lung cancer Father     SOCIAL HISTORY: Social History   Socioeconomic History   Marital status: Married    Spouse name: Fayrene Fearing   Number of children: Not on file   Years of education: Not on file   Highest education level: Not on file  Occupational History   Occupation: Disabled    Comment: disabled due to depression, fibromyalgia, and chronic pain issues  Tobacco Use   Smoking status: Former    Packs/day: 1    Types: Cigarettes    Quit date: 04/23/2021    Years since quitting: 2.0   Smokeless tobacco: Never  Vaping Use   Vaping Use: Never used  Substance and Sexual Activity   Alcohol use: Never   Drug use: Never   Sexual activity: Not on file  Other Topics Concern   Not on file  Social History Narrative   Lives with husband   left Handed   Drinks caffeine rarely   Social Determinants of Health   Financial Resource Strain: Not on file  Food Insecurity: Not on file  Transportation Needs: Not on file  Physical Activity: Not on file  Stress: Not on file  Social Connections: Not on file  Intimate Partner Violence: Not on file     PHYSICAL EXAM  GENERAL EXAM/CONSTITUTIONAL: Vitals:  Vitals:   05/06/23 1506  BP: 138/79  Pulse: 69  Weight: 212 lb (96.2 kg)  Height: 5\' 6"  (1.676 m)    Body mass index is 34.22 kg/m.  Wt Readings from Last 3 Encounters:  05/06/23 212 lb (96.2 kg)  01/07/23 214 lb 11.7 oz (97.4 kg)  07/17/22 204 lb (92.5 kg)   Patient is in no distress; well developed, nourished and groomed; neck is supple  MUSCULOSKELETAL: Gait, strength, tone, movements noted in Neurologic exam  below  NEUROLOGIC: MENTAL STATUS:     05/06/2023    3:14 PM  MMSE - Mini Mental State Exam  Orientation to time 1  Orientation to Place 2  Registration 3  Attention/ Calculation 0  Recall 0  Language- name 2 objects 2  Language- repeat 1  Language- follow 3 step command 3  Language- read & follow direction 1  Write a sentence 0  Copy design 0  Total score 13   Psychomotor retardation  CRANIAL NERVE:  2nd, 3rd, 4th, 6th - visual fields full to confrontation, extraocular muscles intact, no nystagmus 5th - facial sensation symmetric 7th - facial strength symmetric 8th - hearing intact 9th - palate elevates symmetrically, uvula midline 11th - shoulder shrug symmetric 12th - tongue protrusion midline  MOTOR:  normal bulk and tone, at least antigravity   SENSORY:  Normal sensation to light touch   GAIT/STATION:  Using a rolling walker     DIAGNOSTIC DATA (LABS, IMAGING, TESTING) - I reviewed patient records, labs, notes, testing and imaging myself where available.  Lab Results  Component Value Date   WBC 7.3 05/06/2023   HGB 11.0 (L) 05/06/2023   HCT 34.7 05/06/2023   MCV 92 05/06/2023   PLT 157 05/06/2023      Component Value Date/Time   NA 142 05/06/2023 1619   K 5.1 05/06/2023 1619   CL 101 05/06/2023 1619   CO2 28 05/06/2023 1619   GLUCOSE 139 (H) 05/06/2023 1619   GLUCOSE 147 (H) 01/09/2023 0708   BUN 22 05/06/2023 1619   CREATININE 1.17 (H) 05/06/2023 1619   CALCIUM 9.5 05/06/2023 1619   PROT 7.0 05/06/2023 1619   ALBUMIN 3.7 (L) 05/06/2023 1619   AST 11 05/06/2023 1619   ALT 9 05/06/2023 1619   ALKPHOS 81 05/06/2023 1619   BILITOT <0.2 05/06/2023 1619   GFRNONAA 45 (L) 01/09/2023 0708   GFRAA >60 09/23/2020 1328   No results found for: "CHOL", "HDL", "LDLCALC", "LDLDIRECT", "TRIG" Lab Results  Component Value Date   HGBA1C 7.2 (H) 01/04/2023   Lab Results  Component Value Date   VITAMINB12 504 01/04/2023   Lab Results  Component  Value Date   TSH 3.512 01/04/2023   Keppra level 23.3  Head CT [facility]): No acute intracranial abnormalities   Routine EEG: Reported normal.    Brain MRI 10/18/21 1. No acute intracranial abnormality. 2. Generalized volume loss and findings of chronic small vessel disease.   CT head 07/13/22 1.  No acute intracranial abnormality. 2. Mild cerebral volume loss and chronic microvascular ischemic changes of the white matter, unchanged.    ASSESSMENT AND PLAN  67 y.o. year old female  with Fibromyalgia, chronic pain, recurrent UTIs, CKD, chronic liver cirrhosis, hyperlipidemia, hypertension, CHF, obstructive sleep apnea on BiPAP, GERD, prediabetes here for follow up for her seizure and recurrent falls and recent hospital admission.  In terms of her seizures, per husband last seizure was in December, currently she is on Levetiracetam 1000 mg twice daily and Valproic acid 500 mg twice daily. We will check antiseizure level and continue patient on current doses.  In term of her sundowning, will try her on low dose Seroquel in  the evening. Encourage her to continue with physical activity as they is evidence of deconditioning. I will see her in 6 months for follow up.     1. Seizure disorder (HCC)   2. Therapeutic drug monitoring   3. Encephalopathy   4. Sundowning   5. Physical deconditioning      Patient Instructions  Continue current medications  Will check antiseizure medications level and blood work  Start Seroquel 12.5 mg nightly for sundowning  Continue with physical therapy  Return in 6 months or sooner if worse     Per [facility] statutes, patients with seizures are not allowed to drive until they have been seizure-free for six months.  Other recommendations include using caution when using heavy equipment or power tools. Avoid working on ladders or at heights. Take showers instead of baths.  Do not swim alone.  Ensure the water temperature is not too high on the  home water heater. Do not go swimming alone. Do not lock yourself in a room alone (i.e. bathroom). When caring for infants or small children, sit down when holding, feeding, or changing them to minimize risk of injury to the child in the event you have a seizure. Maintain good sleep hygiene. Avoid alcohol.  Also recommend adequate sleep, hydration, good diet and minimize stress.   During the Seizure  - First, ensure adequate ventilation and place patients on the floor on their left side  Loosen clothing around the neck and ensure the airway is patent. If the patient is clenching the teeth, do not force the mouth open with any object as this can cause severe damage - Remove all items from the surrounding that can be hazardous. The patient may be oblivious to what's happening and may not even know what he or she is doing. If the patient is confused and wandering, either gently guide him/her away and block access to outside areas - Reassure the individual and be comforting - Call 911. In most cases, the seizure ends before EMS arrives. However, there are cases when seizures may last over 3 to 5 minutes. Or the individual may have developed breathing difficulties or severe injuries. If a pregnant patient or a person with diabetes develops a seizure, it is prudent to call an ambulance. - Finally, if the patient does not regain full consciousness, then call EMS. Most patients will remain confused for about 45 to 90 minutes after a seizure, so you must use judgment in calling for help. - Avoid restraints but make sure the patient is in a bed with padded side rails - Place the individual in a lateral position with the neck slightly flexed; this will help the saliva drain from the mouth and prevent the tongue from falling backward - Remove all nearby furniture and other hazards from the area - Provide verbal assurance as the individual is regaining consciousness - Provide the patient with privacy if  possible - Call for help and start treatment as ordered by the caregiver   After the Seizure (Postictal Stage)  After a seizure, most patients experience confusion, fatigue, muscle pain and/or a headache. Thus, one should permit the individual to sleep. For the next few days, reassurance is essential. Being calm and helping reorient the person is also of importance.  Most seizures are painless and end spontaneously. Seizures are not harmful to others but can lead to complications such as stress on the lungs, brain and the heart. Individuals with prior lung problems may develop labored breathing  and respiratory distress.     Orders Placed This Encounter  Procedures   Levetiracetam level   Valproic Acid Level   CMP   CBC (no diff)     Meds ordered this encounter  Medications   QUEtiapine (SEROQUEL) 25 MG tablet    Sig: Take 0.5 tablets (12.5 mg total) by mouth at bedtime.    Dispense:  15 tablet    Refill:  0    Return in about 6 months (around 11/06/2023).   Windell Norfolk, MD 05/08/2023, 5:22 PM  [facility] [address], [address] [place], [place] [address] [phone]

## 2023-05-07 NOTE — Patient Instructions (Addendum)
Continue current medications  Will check antiseizure medications level and blood work  Start Seroquel 12.5 mg nightly for sundowning  Continue with physical therapy  Return in 6 months or sooner if worse

## 2023-05-08 LAB — COMPREHENSIVE METABOLIC PANEL
ALT: 9 IU/L (ref 0–32)
AST: 11 IU/L (ref 0–40)
Albumin/Globulin Ratio: 1.1 — ABNORMAL LOW (ref 1.2–2.2)
Albumin: 3.7 g/dL — ABNORMAL LOW (ref 3.9–4.9)
Alkaline Phosphatase: 81 IU/L (ref 44–121)
BUN/Creatinine Ratio: 19 (ref 12–28)
BUN: 22 mg/dL (ref 8–27)
Bilirubin Total: <0.2 mg/dL (ref 0.0–1.2)
CO2: 28 mmol/L (ref 20–29)
Calcium: 9.5 mg/dL (ref 8.7–10.3)
Chloride: 101 mmol/L (ref 96–106)
Creatinine, Ser: 1.17 mg/dL — ABNORMAL HIGH (ref 0.57–1.00)
Globulin, Total: 3.3 g/dL (ref 1.5–4.5)
Glucose: 139 mg/dL — ABNORMAL HIGH (ref 70–99)
Potassium: 5.1 mmol/L (ref 3.5–5.2)
Sodium: 142 mmol/L (ref 134–144)
Total Protein: 7 g/dL (ref 6.0–8.5)
eGFR: 52 mL/min/{1.73_m2} — ABNORMAL LOW (ref >59)

## 2023-05-08 LAB — CBC
Hematocrit: 34.7 % (ref 34.0–46.6)
Hemoglobin: 11 g/dL — ABNORMAL LOW (ref 11.1–15.9)
MCH: 29.2 pg (ref 26.6–33.0)
MCHC: 31.7 g/dL (ref 31.5–35.7)
MCV: 92 fL (ref 79–97)
Platelets: 157 10*3/uL (ref 150–450)
RBC: 3.77 x10E6/uL (ref 3.77–5.28)
RDW: 14.1 % (ref 11.7–15.4)
WBC: 7.3 10*3/uL (ref 3.4–10.8)

## 2023-05-08 LAB — LEVETIRACETAM LEVEL: Levetiracetam Lvl: 64.3 ug/mL — ABNORMAL HIGH (ref 10.0–40.0)

## 2023-05-08 LAB — VALPROIC ACID LEVEL: Valproic Acid Lvl: 55 ug/mL (ref 50–100)

## 2023-05-10 ENCOUNTER — Other Ambulatory Visit: Payer: Self-pay | Admitting: Neurology

## 2023-05-10 ENCOUNTER — Encounter: Payer: Self-pay | Admitting: Neurology

## 2023-05-13 MED ORDER — LEVETIRACETAM 1000 MG PO TABS: 1000.0000 mg | ORAL_TABLET | Freq: Two times a day (BID) | ORAL | 1 refills | Status: DC

## 2023-05-28 ENCOUNTER — Other Ambulatory Visit: Payer: Self-pay | Admitting: Neurology

## 2023-05-29 ENCOUNTER — Other Ambulatory Visit: Payer: Self-pay | Admitting: Neurology

## 2023-05-29 ENCOUNTER — Telehealth: Payer: Self-pay | Admitting: Neurology

## 2023-05-29 MED ORDER — QUETIAPINE FUMARATE 25 MG PO TABS: 25.0000 mg | ORAL_TABLET | Freq: Every day | ORAL | 2 refills | Status: AC

## 2023-05-29 NOTE — Telephone Encounter (Signed)
Mychart msg sent

## 2023-05-29 NOTE — Telephone Encounter (Signed)
Pt husband called. Stated he would like for Dr. Teresa Coombs  to increase QUEtiapine (SEROQUEL) 25 MG tablet into a full tablet. Stated pt needs refills sent to [facility] [phone] -

## 2023-05-29 NOTE — Telephone Encounter (Signed)
OK to increase to 25 mg nightly. New Rx sent to pharmacy

## 2023-06-12 NOTE — Telephone Encounter (Signed)
Yes, just fax my last note. He has the diagnosis of Sundowning and Encephalopathy.

## 2023-11-06 ENCOUNTER — Other Ambulatory Visit: Payer: Self-pay | Admitting: Neurology

## 2023-11-13 ENCOUNTER — Ambulatory Visit: Payer: Medicare HMO | Admitting: Neurology

## 2024-01-29 ENCOUNTER — Ambulatory Visit: Payer: Medicare HMO | Admitting: Neurology

## 2024-03-30 ENCOUNTER — Ambulatory Visit: Payer: Medicare HMO | Admitting: Neurology

## 2024-04-14 ENCOUNTER — Ambulatory Visit: Payer: Medicare HMO | Admitting: Neurology

## 2024-05-24 DEATH — deceased
